# Patient Record
Sex: Male | Born: 1950
Health system: Southern US, Community
[De-identification: ages and names within clinical notes are randomized; demographics above are authoritative.]

## PROBLEM LIST (undated history)

## (undated) DIAGNOSIS — I10 Essential (primary) hypertension: Secondary | ICD-10-CM

## (undated) DIAGNOSIS — J189 Pneumonia, unspecified organism: Secondary | ICD-10-CM

## (undated) DIAGNOSIS — K219 Gastro-esophageal reflux disease without esophagitis: Secondary | ICD-10-CM

## (undated) DIAGNOSIS — Z87442 Personal history of urinary calculi: Secondary | ICD-10-CM

## (undated) DIAGNOSIS — E7800 Pure hypercholesterolemia, unspecified: Secondary | ICD-10-CM

## (undated) DIAGNOSIS — E785 Hyperlipidemia, unspecified: Secondary | ICD-10-CM

## (undated) DIAGNOSIS — J449 Chronic obstructive pulmonary disease, unspecified: Secondary | ICD-10-CM

## (undated) DIAGNOSIS — M1990 Unspecified osteoarthritis, unspecified site: Secondary | ICD-10-CM

## (undated) DIAGNOSIS — R7303 Prediabetes: Secondary | ICD-10-CM

## (undated) DIAGNOSIS — T7840XA Allergy, unspecified, initial encounter: Secondary | ICD-10-CM

## (undated) DIAGNOSIS — C801 Malignant (primary) neoplasm, unspecified: Secondary | ICD-10-CM

## (undated) DIAGNOSIS — N189 Chronic kidney disease, unspecified: Secondary | ICD-10-CM

## (undated) DIAGNOSIS — T8859XA Other complications of anesthesia, initial encounter: Secondary | ICD-10-CM

## (undated) HISTORY — DX: Allergy, unspecified, initial encounter: T78.40XA

## (undated) HISTORY — DX: Essential (primary) hypertension: I10

## (undated) HISTORY — DX: Chronic obstructive pulmonary disease, unspecified: J44.9

## (undated) HISTORY — DX: Gastro-esophageal reflux disease without esophagitis: K21.9

## (undated) HISTORY — DX: Hyperlipidemia, unspecified: E78.5

## (undated) HISTORY — DX: Malignant (primary) neoplasm, unspecified: C80.1

## (undated) HISTORY — DX: Pure hypercholesterolemia, unspecified: E78.00

## (undated) HISTORY — PX: NASAL SINUS SURGERY: SHX719

## (undated) HISTORY — PX: HERNIA REPAIR: SHX51

---

## 2006-11-04 ENCOUNTER — Emergency Department: Payer: Self-pay | Admitting: Emergency Medicine

## 2010-05-17 ENCOUNTER — Ambulatory Visit: Payer: Self-pay | Admitting: Otolaryngology

## 2014-01-17 ENCOUNTER — Ambulatory Visit (INDEPENDENT_AMBULATORY_CARE_PROVIDER_SITE_OTHER): Payer: BC Managed Care – PPO

## 2014-01-17 ENCOUNTER — Ambulatory Visit (INDEPENDENT_AMBULATORY_CARE_PROVIDER_SITE_OTHER): Payer: BC Managed Care – PPO | Admitting: Podiatry

## 2014-01-17 ENCOUNTER — Encounter: Payer: Self-pay | Admitting: Podiatry

## 2014-01-17 VITALS — BP 123/81 | HR 67 | Resp 16 | Ht 67.0 in | Wt 200.0 lb

## 2014-01-17 DIAGNOSIS — M722 Plantar fascial fibromatosis: Secondary | ICD-10-CM

## 2014-01-17 MED ORDER — TRIAMCINOLONE ACETONIDE 10 MG/ML IJ SUSP
10.0000 mg | Freq: Once | INTRAMUSCULAR | Status: AC
Start: 2014-01-17 — End: 2014-01-17
  Administered 2014-01-17: 10 mg

## 2014-01-17 NOTE — Progress Notes (Signed)
   Subjective:    Patient ID: Steve Gilbert, male    DOB: 15-Feb-1951, 63 y.o.   MRN: [id number]  HPI Comments: Been having left heel pain , started the first week of June. Using over the counter inserts. Had orthotics years ago.   Foot Pain      Review of Systems  HENT:       Sinus problems   Gastrointestinal: Positive for diarrhea.  Hematological: Bruises/bleeds easily.  All other systems reviewed and are negative.      Objective:   Physical Exam        Assessment & Plan:

## 2014-01-17 NOTE — Patient Instructions (Signed)
Plantar Fasciitis (Heel Spur Syndrome) with Rehab The plantar fascia is a fibrous, ligament-like, soft-tissue structure that spans the bottom of the foot. Plantar fasciitis is a condition that causes pain in the foot due to inflammation of the tissue. SYMPTOMS   Pain and tenderness on the underneath side of the foot.  Pain that worsens with standing or walking. CAUSES  Plantar fasciitis is caused by irritation and injury to the plantar fascia on the underneath side of the foot. Common mechanisms of injury include:  Direct trauma to bottom of the foot.  Damage to a small nerve that runs under the foot where the main fascia attaches to the heel bone.  Stress placed on the plantar fascia due to bone spurs. RISK INCREASES WITH:   Activities that place stress on the plantar fascia (running, jumping, pivoting, or cutting).  Poor strength and flexibility.  Improperly fitted shoes.  Tight calf muscles.  Flat feet.  Failure to warm-up properly before activity.  Obesity. PREVENTION  Warm up and stretch properly before activity.  Allow for adequate recovery between workouts.  Maintain physical fitness:  Strength, flexibility, and endurance.  Cardiovascular fitness.  Maintain a health body weight.  Avoid stress on the plantar fascia.  Wear properly fitted shoes, including arch supports for individuals who have flat feet. PROGNOSIS  If treated properly, then the symptoms of plantar fasciitis usually resolve without surgery. However, occasionally surgery is necessary. RELATED COMPLICATIONS   Recurrent symptoms that may result in a chronic condition.  Problems of the lower back that are caused by compensating for the injury, such as limping.  Pain or weakness of the foot during push-off following surgery.  Chronic inflammation, scarring, and partial or complete fascia tear, occurring more often from repeated injections. TREATMENT  Treatment initially involves the use of  ice and medication to help reduce pain and inflammation. The use of strengthening and stretching exercises may help reduce pain with activity, especially stretches of the Achilles tendon. These exercises may be performed at home or with a therapist. Your caregiver may recommend that you use heel cups of arch supports to help reduce stress on the plantar fascia. Occasionally, corticosteroid injections are given to reduce inflammation. If symptoms persist for greater than 6 months despite non-surgical (conservative), then surgery may be recommended.  MEDICATION   If pain medication is necessary, then nonsteroidal anti-inflammatory medications, such as aspirin and ibuprofen, or other minor pain relievers, such as acetaminophen, are often recommended.  Do not take pain medication within 7 days before surgery.  Prescription pain relievers may be given if deemed necessary by your caregiver. Use only as directed and only as much as you need.  Corticosteroid injections may be given by your caregiver. These injections should be reserved for the most serious cases, because they may only be given a certain number of times. HEAT AND COLD  Cold treatment (icing) relieves pain and reduces inflammation. Cold treatment should be applied for 10 to 15 minutes every 2 to 3 hours for inflammation and pain and immediately after any activity that aggravates your symptoms. Use ice packs or massage the area with a piece of ice (ice massage).  Heat treatment may be used prior to performing the stretching and strengthening activities prescribed by your caregiver, physical therapist, or athletic trainer. Use a heat pack or soak the injury in warm water. SEEK IMMEDIATE MEDICAL CARE IF:  Treatment seems to offer no benefit, or the condition worsens.  Any medications produce adverse side effects. EXERCISES RANGE   OF MOTION (ROM) AND STRETCHING EXERCISES - Plantar Fasciitis (Heel Spur Syndrome) These exercises may help you  when beginning to rehabilitate your injury. Your symptoms may resolve with or without further involvement from your physician, physical therapist or athletic trainer. While completing these exercises, remember:   Restoring tissue flexibility helps normal motion to return to the joints. This allows healthier, less painful movement and activity.  An effective stretch should be held for at least 30 seconds.  A stretch should never be painful. You should only feel a gentle lengthening or release in the stretched tissue. RANGE OF MOTION - Toe Extension, Flexion  Sit with your right / left leg crossed over your opposite knee.  Grasp your toes and gently pull them back toward the top of your foot. You should feel a stretch on the bottom of your toes and/or foot.  Hold this stretch for __________ seconds.  Now, gently pull your toes toward the bottom of your foot. You should feel a stretch on the top of your toes and or foot.  Hold this stretch for __________ seconds. Repeat __________ times. Complete this stretch __________ times per day.  RANGE OF MOTION - Ankle Dorsiflexion, Active Assisted  Remove shoes and sit on a chair that is preferably not on a carpeted surface.  Place right / left foot under knee. Extend your opposite leg for support.  Keeping your heel down, slide your right / left foot back toward the chair until you feel a stretch at your ankle or calf. If you do not feel a stretch, slide your bottom forward to the edge of the chair, while still keeping your heel down.  Hold this stretch for __________ seconds. Repeat __________ times. Complete this stretch __________ times per day.  STRETCH - Gastroc, Standing  Place hands on wall.  Extend right / left leg, keeping the front knee somewhat bent.  Slightly point your toes inward on your back foot.  Keeping your right / left heel on the floor and your knee straight, shift your weight toward the wall, not allowing your back to  arch.  You should feel a gentle stretch in the right / left calf. Hold this position for __________ seconds. Repeat __________ times. Complete this stretch __________ times per day. STRETCH - Soleus, Standing  Place hands on wall.  Extend right / left leg, keeping the other knee somewhat bent.  Slightly point your toes inward on your back foot.  Keep your right / left heel on the floor, bend your back knee, and slightly shift your weight over the back leg so that you feel a gentle stretch deep in your back calf.  Hold this position for __________ seconds. Repeat __________ times. Complete this stretch __________ times per day. STRETCH - Gastrocsoleus, Standing  Note: This exercise can place a lot of stress on your foot and ankle. Please complete this exercise only if specifically instructed by your caregiver.   Place the ball of your right / left foot on a step, keeping your other foot firmly on the same step.  Hold on to the wall or a rail for balance.  Slowly lift your other foot, allowing your body weight to press your heel down over the edge of the step.  You should feel a stretch in your right / left calf.  Hold this position for __________ seconds.  Repeat this exercise with a slight bend in your right / left knee. Repeat __________ times. Complete this stretch __________ times per day.    STRENGTHENING EXERCISES - Plantar Fasciitis (Heel Spur Syndrome)  These exercises may help you when beginning to rehabilitate your injury. They may resolve your symptoms with or without further involvement from your physician, physical therapist or athletic trainer. While completing these exercises, remember:   Muscles can gain both the endurance and the strength needed for everyday activities through controlled exercises.  Complete these exercises as instructed by your physician, physical therapist or athletic trainer. Progress the resistance and repetitions only as guided. STRENGTH -  Towel Curls  Sit in a chair positioned on a non-carpeted surface.  Place your foot on a towel, keeping your heel on the floor.  Pull the towel toward your heel by only curling your toes. Keep your heel on the floor.  If instructed by your physician, physical therapist or athletic trainer, add ____________________ at the end of the towel. Repeat __________ times. Complete this exercise __________ times per day. STRENGTH - Ankle Inversion  Secure one end of a rubber exercise band/tubing to a fixed object (table, pole). Loop the other end around your foot just before your toes.  Place your fists between your knees. This will focus your strengthening at your ankle.  Slowly, pull your big toe up and in, making sure the band/tubing is positioned to resist the entire motion.  Hold this position for __________ seconds.  Have your muscles resist the band/tubing as it slowly pulls your foot back to the starting position. Repeat __________ times. Complete this exercises __________ times per day.  Document Released: 07/04/2005 Document Revised: 09/26/2011 Document Reviewed: 10/16/2008 [employer] Patient Information 2015 [employer]. This information is not intended to replace advice given to you by your health care provider. Make sure you discuss any questions you have with your health care provider.  

## 2014-01-17 NOTE — Progress Notes (Signed)
Subjective:     Patient ID: Steve Gilbert, male   DOB: [birth year], 63 y.o.   MRN: [id number]  Foot Pain   patient states that I have a lot of pain in my left heel and it has been bothering me more for the last few weeks. I've had previous episodes with this and I have had orthotics but I'm not wearing [place] to have worn out  Review of Systems  All other systems reviewed and are negative.      Objective:   Physical Exam  Nursing note and vitals reviewed. Cardiovascular: Intact distal pulses.   Musculoskeletal: Normal range of motion.  Neurological: He is alert.  Skin: Skin is warm.   neurovascular status intact with muscle strength adequate and range of motion subtalar and midtarsal joint was found to be normal. Patient's neurovascular status is intact and I noted upon palpation plantar aspect of left heel is very tender when pressed making walking difficult    Assessment:     Acute plan her fasciitis plantar aspect left heel    Plan:     H&P and x-rays reviewed and injected the left plantar fashion 3 mg Kenalog 5 mg I can Marcaine mixture placed on diclofenac 75 mg twice a day dispensed fascially brace and reappoint in 3 weeks for also consideration of orthotics depending on response

## 2014-02-07 ENCOUNTER — Ambulatory Visit (INDEPENDENT_AMBULATORY_CARE_PROVIDER_SITE_OTHER): Payer: BC Managed Care – PPO | Admitting: Podiatry

## 2014-02-07 ENCOUNTER — Encounter: Payer: Self-pay | Admitting: Podiatry

## 2014-02-07 DIAGNOSIS — M722 Plantar fascial fibromatosis: Secondary | ICD-10-CM

## 2014-02-07 MED ORDER — TRIAMCINOLONE ACETONIDE 10 MG/ML IJ SUSP
10.0000 mg | Freq: Once | INTRAMUSCULAR | Status: AC
Start: 2014-02-07 — End: 2014-02-07
  Administered 2014-02-07: 10 mg

## 2014-02-08 NOTE — Progress Notes (Signed)
Subjective:     Patient ID: Steve Gilbert, male   DOB: March 08, 1951, 63 y.o.   MRN: [id number]  HPI patient continues to experience discomfort in the heel that is quite intense and admits that is worse in the morning and after periods of sitting.   Review of Systems     Objective:   Physical Exam Neurovascular status intact with pain in the plantar heel left that sore when pressed and making it difficult to walk comfortably. Also complains that he is starting to develop pain in other parts of his foot and lower leg    Assessment:     Acute plantar fasciitis    Plan:     Reviewed condition and at this time reinjected the plantar fascia 3 mg Kenalog 5 mg I can Marcaine mixture dispensed night splint with all instructions on usage and scanned for custom orthotic devices. Also placed on anti-inflammatory and instructed on reduced activity and stretching exercises

## 2014-02-11 NOTE — Progress Notes (Signed)
Patient returned night splint that he was given on 02/07/14. His wife already had one that he could use.

## 2014-02-18 ENCOUNTER — Encounter: Payer: Self-pay | Admitting: *Deleted

## 2014-02-18 NOTE — Progress Notes (Signed)
Sent pt post card letting him know orthotics are here.

## 2014-05-17 DIAGNOSIS — I10 Essential (primary) hypertension: Secondary | ICD-10-CM | POA: Insufficient documentation

## 2014-05-17 DIAGNOSIS — J449 Chronic obstructive pulmonary disease, unspecified: Secondary | ICD-10-CM | POA: Insufficient documentation

## 2014-05-17 DIAGNOSIS — K219 Gastro-esophageal reflux disease without esophagitis: Secondary | ICD-10-CM | POA: Insufficient documentation

## 2014-05-30 DIAGNOSIS — J300 Vasomotor rhinitis: Secondary | ICD-10-CM | POA: Insufficient documentation

## 2015-09-30 DIAGNOSIS — K5900 Constipation, unspecified: Secondary | ICD-10-CM | POA: Insufficient documentation

## 2017-07-31 DIAGNOSIS — I10 Essential (primary) hypertension: Secondary | ICD-10-CM | POA: Diagnosis not present

## 2017-07-31 DIAGNOSIS — Z0000 Encounter for general adult medical examination without abnormal findings: Secondary | ICD-10-CM | POA: Diagnosis not present

## 2017-07-31 DIAGNOSIS — E7800 Pure hypercholesterolemia, unspecified: Secondary | ICD-10-CM | POA: Diagnosis not present

## 2017-08-08 DIAGNOSIS — Z0000 Encounter for general adult medical examination without abnormal findings: Secondary | ICD-10-CM | POA: Insufficient documentation

## 2017-08-08 DIAGNOSIS — I10 Essential (primary) hypertension: Secondary | ICD-10-CM | POA: Diagnosis not present

## 2017-08-08 DIAGNOSIS — E7800 Pure hypercholesterolemia, unspecified: Secondary | ICD-10-CM | POA: Diagnosis not present

## 2017-08-08 DIAGNOSIS — J449 Chronic obstructive pulmonary disease, unspecified: Secondary | ICD-10-CM | POA: Diagnosis not present

## 2017-08-16 DIAGNOSIS — Z0000 Encounter for general adult medical examination without abnormal findings: Secondary | ICD-10-CM | POA: Diagnosis not present

## 2017-11-28 DIAGNOSIS — Z0000 Encounter for general adult medical examination without abnormal findings: Secondary | ICD-10-CM | POA: Diagnosis not present

## 2017-11-28 DIAGNOSIS — I10 Essential (primary) hypertension: Secondary | ICD-10-CM | POA: Diagnosis not present

## 2017-11-28 DIAGNOSIS — E7800 Pure hypercholesterolemia, unspecified: Secondary | ICD-10-CM | POA: Diagnosis not present

## 2017-12-06 DIAGNOSIS — J449 Chronic obstructive pulmonary disease, unspecified: Secondary | ICD-10-CM | POA: Diagnosis not present

## 2017-12-06 DIAGNOSIS — K219 Gastro-esophageal reflux disease without esophagitis: Secondary | ICD-10-CM | POA: Diagnosis not present

## 2017-12-06 DIAGNOSIS — R319 Hematuria, unspecified: Secondary | ICD-10-CM | POA: Diagnosis not present

## 2017-12-06 DIAGNOSIS — I10 Essential (primary) hypertension: Secondary | ICD-10-CM | POA: Diagnosis not present

## 2017-12-06 DIAGNOSIS — E7800 Pure hypercholesterolemia, unspecified: Secondary | ICD-10-CM | POA: Diagnosis not present

## 2017-12-14 DIAGNOSIS — R5383 Other fatigue: Secondary | ICD-10-CM | POA: Diagnosis not present

## 2017-12-14 DIAGNOSIS — R112 Nausea with vomiting, unspecified: Secondary | ICD-10-CM | POA: Diagnosis not present

## 2017-12-14 DIAGNOSIS — N390 Urinary tract infection, site not specified: Secondary | ICD-10-CM | POA: Diagnosis not present

## 2017-12-18 DIAGNOSIS — R7989 Other specified abnormal findings of blood chemistry: Secondary | ICD-10-CM | POA: Diagnosis not present

## 2018-01-12 ENCOUNTER — Ambulatory Visit (INDEPENDENT_AMBULATORY_CARE_PROVIDER_SITE_OTHER): Payer: PPO | Admitting: Urology

## 2018-01-12 ENCOUNTER — Encounter: Payer: Self-pay | Admitting: Urology

## 2018-01-12 VITALS — BP 132/87 | HR 91 | Resp 16 | Ht 67.0 in | Wt 205.4 lb

## 2018-01-12 DIAGNOSIS — R3129 Other microscopic hematuria: Secondary | ICD-10-CM

## 2018-01-12 DIAGNOSIS — N401 Enlarged prostate with lower urinary tract symptoms: Secondary | ICD-10-CM

## 2018-01-12 DIAGNOSIS — N138 Other obstructive and reflux uropathy: Secondary | ICD-10-CM

## 2018-01-12 DIAGNOSIS — N4889 Other specified disorders of penis: Secondary | ICD-10-CM

## 2018-01-12 DIAGNOSIS — Z87442 Personal history of urinary calculi: Secondary | ICD-10-CM | POA: Diagnosis not present

## 2018-01-12 DIAGNOSIS — R310 Gross hematuria: Secondary | ICD-10-CM | POA: Diagnosis not present

## 2018-01-12 DIAGNOSIS — N5203 Combined arterial insufficiency and corporo-venous occlusive erectile dysfunction: Secondary | ICD-10-CM | POA: Diagnosis not present

## 2018-01-12 DIAGNOSIS — R319 Hematuria, unspecified: Secondary | ICD-10-CM | POA: Diagnosis not present

## 2018-01-12 DIAGNOSIS — N402 Nodular prostate without lower urinary tract symptoms: Secondary | ICD-10-CM | POA: Diagnosis not present

## 2018-01-12 LAB — MICROSCOPIC EXAMINATION

## 2018-01-12 LAB — URINALYSIS, COMPLETE
BILIRUBIN UA: NEGATIVE
GLUCOSE, UA: NEGATIVE
KETONES UA: NEGATIVE
NITRITE UA: NEGATIVE
Protein, UA: NEGATIVE
Specific Gravity, UA: <1.005 — ABNORMAL LOW (ref 1.005–1.030)
Urobilinogen, Ur: 0.2 mg/dL (ref 0.2–1.0)
pH, UA: 6 (ref 5.0–7.5)

## 2018-01-12 MED ORDER — TAMSULOSIN HCL 0.4 MG PO CAPS: 0.4000 mg | ORAL_CAPSULE | Freq: Every day | ORAL | 0 refills | Status: DC

## 2018-01-12 NOTE — Progress Notes (Signed)
01/12/2018 12:09 PM   Steve Gilbert 1950-09-17 [id number]  Referring provider: Derinda Late, MD [phone] [facility] [place], [place] [address]  Chief Complaint  Patient presents with  . Hematuria    HPI: 67 yo male who presents today primarily for further evaluation of hematuria/hematospermia but also found to have multiple additional GU issues today.  He notes that approximately 1 month ago, he developed sharp penile pains radiating to the tip of his penis.  These occurred several times a day for several days in a row and then began to subside.  He had no associated urinary symptoms or bladder pain associated with this.  Shortly thereafter, he noticed bloodstained tinge in his underwear after intercourse the preceding day.  He believes that this may have come from his ejaculate fluid but may have come from his urine.  He was treated for presumed urinary tract infection on 12/06/2017 by his PCP for presumed urinary tract infection with a relatively benign appearing urinalysis.  He had 4-10 white blood cells per high-power field with no red blood cells.   Ultimately, the culture was negative.  He was treated with Bactrim but then developed nausea and vomiting and told to stop this medication.  He was referred to urology for further evaluation.  He does have baseline severe urinary symptoms.  He endorses a weak stream along with urinary hesitancy and postvoid dribbling.  He does feel like he is able to empty his bladder completely.  He has severe nocturia and gets up 5-8 times on average at nighttime which is long-standing.  He started saw palmetto several months ag and initially thought that this was not helping but when he ran out of his medication, his urinary symptoms worsened.  In terms of his nighttime urinary symptoms, he does not carry a diagnosis of obstructive sleep apnea.  He did have a sleep study back in the 90s and was told that  he does not have sleep apnea.  He has had nasal surgery in the past.  No lower extremity edema.  He does have mild to moderate ED with difficulty achieving an erection at times.  He is tried Viagra in the past and did not feel that there was much benefit to this medication.  He does history of kidney stones and has passed several kidney stone spontaneously.  He felt that this episode of penile pain was somewhat different than his previous kidney stone episodes.  He is never required surgical intervention for his stones.  He denies any flank pain.  Most recent PSA 1.4 07/2017.  No family history of prostate cancer.    He is a former smoker.  PMH: Past Medical History:  Diagnosis Date  . Acid reflux   . Allergy   . Cancer ([facility])   . COPD (chronic obstructive pulmonary disease) ([facility])   . High cholesterol   . Hypertension     Surgical History: Past Surgical History:  Procedure Laterality Date  . NASAL SINUS SURGERY      Home Medications:  Allergies as of 01/12/2018      Reactions   Contrast Media [iodinated Diagnostic Agents]       Medication List        Accurate as of 01/12/18 12:09 PM. Always use your most recent med list.          amlodipine-atorvastatin 10-10 MG tablet Commonly known as:  CADUET Take 1 tablet by mouth daily.  atorvastatin 20 MG tablet Commonly known as:  LIPITOR Take 20 mg by mouth daily.   BABY ASPIRIN PO Take by mouth.   DULERA 200-5 MCG/ACT Aero Generic drug:  mometasone-formoterol Inhale 2 puffs into the lungs 2 (two) times daily.   Fluticasone-Salmeterol 250-50 MCG/DOSE Aepb Commonly known as:  ADVAIR Inhale 1 puff into the lungs 2 (two) times daily.   losartan-hydrochlorothiazide 100-12.5 MG tablet Commonly known as:  HYZAAR Take 1 tablet by mouth daily.   MULTIVITAMIN & MINERAL PO Take by mouth.   pantoprazole 40 MG tablet Commonly known as:  PROTONIX Take 40 mg by mouth daily.   PATANASE NA Place into the nose.     rosuvastatin 5 MG tablet Commonly known as:  CRESTOR Take 5 mg by mouth daily.   saw palmetto 160 MG capsule Take 160 mg by mouth 2 (two) times daily.   tamsulosin 0.4 MG Caps capsule Commonly known as:  FLOMAX Take 1 capsule (0.4 mg total) by mouth daily.   tiotropium 18 MCG inhalation capsule Commonly known as:  SPIRIVA Place 18 mcg into inhaler and inhale daily.   TRIBENZOR 20-5-12.5 MG Tabs Generic drug:  Olmesartan-amLODIPine-HCTZ Take by mouth.   Vitamin D-3 5000 units Tabs Take by mouth.       Allergies:  Allergies  Allergen Reactions  . Contrast Media [Iodinated Diagnostic Agents]     Family History: Family History  Problem Relation Age of Onset  . Diabetes Mother   . Heart disease Father     Social History:  reports that he has quit smoking. He has never used smokeless tobacco. His alcohol and drug histories are not on file.  ROS: UROLOGY Frequent Urination?: Yes Hard to postpone urination?: Yes Burning/pain with urination?: No Get up at night to urinate?: Yes Leakage of urine?: Yes Urine stream starts and stops?: Yes Trouble starting stream?: Yes Do you have to strain to urinate?: Yes Blood in urine?: Yes Urinary tract infection?: No Sexually transmitted disease?: No Injury to kidneys or bladder?: No Painful intercourse?: No Weak stream?: Yes Erection problems?: Yes Penile pain?: Yes  Gastrointestinal Nausea?: Yes Vomiting?: Yes Indigestion/heartburn?: No Diarrhea?: No Constipation?: No  Constitutional Fever: No Night sweats?: No Weight loss?: No Fatigue?: No  Skin Skin rash/lesions?: No Itching?: No  Eyes Blurred vision?: No Double vision?: No  Ears/Nose/Throat Sore throat?: No Sinus problems?: Yes  Hematologic/Lymphatic Swollen glands?: No Easy bruising?: Yes  Cardiovascular Leg swelling?: No Chest pain?: No  Respiratory Cough?: No Shortness of breath?: No  Endocrine Excessive thirst?:  No  Musculoskeletal Back pain?: No Joint pain?: No  Neurological Headaches?: No Dizziness?: No  Psychologic Depression?: No Anxiety?: No  Physical Exam: BP 132/87   Pulse 91   Resp 16   Ht 5\' 7"  (1.702 m)   Wt 205 lb 6.4 oz (93.2 kg)   SpO2 96%   BMI 32.17 kg/m   Constitutional:  Alert and oriented, No acute distress. HEENT: [place] AT, moist mucus membranes.  Trachea midline, no masses. Cardiovascular: No clubbing, cyanosis, or edema. Respiratory: Normal respiratory effort, no increased work of breathing. GI: Abdomen is soft, nontender, nondistended, no abdominal masses GU: Circumcised phallus with orthotopic meatus.  No penile discharge.  No palpable penile lesions.  Bilateral descended testicles, no masses, nontender. Rectal: +external hemorrhoids.  Normal sphincter tone.  Small 30 cc prostate with 1 cm firm nodule at left apex.   Lymph: No cervical or inguinal lymphadenopathy. Skin: No rashes, bruises or suspicious lesions. Neurologic: Grossly intact, no  focal deficits, moving all 4 extremities. Psychiatric: Normal mood and affect.  Laboratory Data: Multiple urinalysis from 5/22 and 12/14/2017 personally reviewed today as well as urine culture data.  Creatinine 1.1 on 12/18/2017, previously 1.5 on 12/14/2017   Urinalysis Results for orders placed or performed in visit on 01/12/18  Microscopic Examination  Result Value Ref Range   WBC, UA 6-10 (A) 0 - 5 /hpf   RBC, UA 3-10 (A) 0 - 2 /hpf   Epithelial Cells (non renal) 0-10 0 - 10 /hpf   Mucus, UA Present (A) Not Estab.   Bacteria, UA Few (A) None seen/Few  Urinalysis, Complete  Result Value Ref Range   Specific Gravity, UA <1.005 (L) 1.005 - 1.030   pH, UA 6.0 5.0 - 7.5   Color, UA Yellow Yellow   Appearance Ur Clear Clear   Leukocytes, UA 1+ (A) Negative   Protein, UA Negative Negative/Trace   Glucose, UA Negative Negative   Ketones, UA Negative Negative   RBC, UA 2+ (A) Negative   Bilirubin, UA Negative  Negative   Urobilinogen, Ur 0.2 0.2 - 1.0 mg/dL   Nitrite, UA Negative Negative   Microscopic Examination See below:      Pertinent Imaging: n/a.  Assessment & Plan:    1. Microscopic hematuria Evidence of microscopic hematuria today which is concerning Plan for urine culture to rule out infection although not suspected based on urinalysis today We discussed the differential diagnosis for microscopic hematuria including nephrolithiasis, renal or upper tract tumors, bladder stones, UTIs, or bladder tumors as well as undetermined etiologies. Per AUA guidelines, I did recommend complete microscopic hematuria evaluation including CTU, possible urine cytology, and office cystoscopy. He is agreeable with this plan - Urinalysis, Complete - CT HEMATURIA WORKUP; Future - CULTURE, URINE COMPREHENSIVE  2. History of kidney stones Will assess with CT scan as above ? whether episode of penile pain possibly related to passing a small stone versus prostatitis  3. BPH with urinary obstruction Fairly significant obstructive urinary symptoms with nocturia We will go ahead and start the patient on Flomax and reassess urinary symptoms visit  Continue saw palmetto continue  4. Combined arterial insufficiency and corporo-venous occlusive erectile dysfunction Previously tried Viagra without response Will address further at follow-up visits  5. Prostate nodule Although his PSA is relatively low, nodule is highly suspicious for prostate cancer although differential diagnosis includes BPH nodule I strongly recommended prostate biopsy for further assessment We discussed prostate biopsy in detail including the procedure itself, the risks of blood in the urine, stool, and ejaculate, serious infection, and discomfort. He is willing to proceed with this as discussed.  6. Penile pain Improving Differential diagnosis includes prostatitis versus UTI versus stone episode   Return for cysto, CT scan,  prostate biopsy.  Hollice Espy, MD  [facility] [address], [place], [place] [address] [phone]

## 2018-01-15 LAB — CULTURE, URINE COMPREHENSIVE

## 2018-01-22 ENCOUNTER — Telehealth: Payer: Self-pay | Admitting: Urology

## 2018-01-22 NOTE — Telephone Encounter (Signed)
Patient has an order for a CT scan but it states that he is allergic to contrast. Does he need to be premedicated? Please advise   Thanks, Sharyn Lull

## 2018-01-23 ENCOUNTER — Other Ambulatory Visit: Payer: Self-pay

## 2018-01-23 MED ORDER — DIPHENHYDRAMINE HCL 50 MG PO TABS: 50.0000 mg | ORAL_TABLET | Freq: Once | ORAL | 0 refills | Status: DC

## 2018-01-23 MED ORDER — PREDNISONE 50 MG PO TABS: ORAL_TABLET | ORAL | 0 refills | Status: DC

## 2018-01-23 MED ORDER — RANITIDINE HCL 150 MG PO TABS: 150.0000 mg | ORAL_TABLET | Freq: Once | ORAL | 0 refills | Status: DC

## 2018-01-30 ENCOUNTER — Encounter (INDEPENDENT_AMBULATORY_CARE_PROVIDER_SITE_OTHER): Payer: Self-pay

## 2018-02-15 DIAGNOSIS — C675 Malignant neoplasm of bladder neck: Secondary | ICD-10-CM

## 2018-02-15 HISTORY — DX: Malignant neoplasm of bladder neck: C67.5

## 2018-03-09 ENCOUNTER — Ambulatory Visit
Admission: RE | Admit: 2018-03-09 | Discharge: 2018-03-09 | Disposition: A | Discharge status: Home / Self Care | Payer: PPO | Source: Ambulatory Visit | Attending: Urology | Admitting: Urology

## 2018-03-09 DIAGNOSIS — R3129 Other microscopic hematuria: Secondary | ICD-10-CM | POA: Diagnosis not present

## 2018-03-09 DIAGNOSIS — K4090 Unilateral inguinal hernia, without obstruction or gangrene, not specified as recurrent: Secondary | ICD-10-CM | POA: Insufficient documentation

## 2018-03-09 DIAGNOSIS — I700 Atherosclerosis of aorta: Secondary | ICD-10-CM | POA: Diagnosis not present

## 2018-03-09 LAB — POCT I-STAT CREATININE: CREATININE: 1.1 mg/dL (ref 0.61–1.24)

## 2018-03-09 MED ORDER — IOPAMIDOL (ISOVUE-300) INJECTION 61%
125.0000 mL | Freq: Once | INTRAVENOUS | Status: AC | PRN
  Administered 2018-03-09: 125 mL via INTRAVENOUS

## 2018-03-13 ENCOUNTER — Telehealth: Payer: Self-pay | Admitting: Radiology

## 2018-03-13 NOTE — Telephone Encounter (Signed)
Ok the app says he is scheduled for a BX, cysto and CT results. He is under the impression that is what he is having done? He was told to do the prep for the BX, does he not need to do this?  Sharyn Lull

## 2018-03-13 NOTE — Telephone Encounter (Signed)
I tried to get the patient to come in today but he was unable to come in. I did get him rescheduled for his BX and results. Please let me know if 03-29-18 is to long for him to wait and if it is where I can put him that will not overlap with another BX.  Thanks, Sharyn Lull

## 2018-03-13 NOTE — Telephone Encounter (Signed)
This patient needs to be rescheduled in clinic because you will be in surgery at his appointment time.  When can we get him on your schedule? Thanks.

## 2018-03-13 NOTE — Telephone Encounter (Signed)
This is not a biopsy.  This is to discuss CT scan results.  I would like to see him sooner rather than later, please put him on the wait list but keep this appointment unless something comes up sooner.  Hollice Espy, MD

## 2018-03-13 NOTE — Telephone Encounter (Signed)
Sorry, yes he has multiple issues.  He has multiple issues.    Steve Espy, MD

## 2018-03-15 ENCOUNTER — Other Ambulatory Visit: Payer: PPO | Admitting: Urology

## 2018-03-27 DIAGNOSIS — M7552 Bursitis of left shoulder: Secondary | ICD-10-CM | POA: Diagnosis not present

## 2018-03-29 ENCOUNTER — Encounter: Payer: Self-pay | Admitting: Urology

## 2018-03-29 ENCOUNTER — Ambulatory Visit (INDEPENDENT_AMBULATORY_CARE_PROVIDER_SITE_OTHER): Payer: PPO | Admitting: Urology

## 2018-03-29 VITALS — BP 143/90 | HR 91 | Ht 67.0 in | Wt 205.0 lb

## 2018-03-29 DIAGNOSIS — K4090 Unilateral inguinal hernia, without obstruction or gangrene, not specified as recurrent: Secondary | ICD-10-CM

## 2018-03-29 DIAGNOSIS — R3129 Other microscopic hematuria: Secondary | ICD-10-CM | POA: Diagnosis not present

## 2018-03-29 DIAGNOSIS — C679 Malignant neoplasm of bladder, unspecified: Secondary | ICD-10-CM | POA: Diagnosis not present

## 2018-03-29 DIAGNOSIS — R319 Hematuria, unspecified: Secondary | ICD-10-CM | POA: Diagnosis not present

## 2018-03-29 DIAGNOSIS — N1330 Unspecified hydronephrosis: Secondary | ICD-10-CM

## 2018-03-29 DIAGNOSIS — N4889 Other specified disorders of penis: Secondary | ICD-10-CM

## 2018-03-29 DIAGNOSIS — N402 Nodular prostate without lower urinary tract symptoms: Secondary | ICD-10-CM

## 2018-03-29 LAB — URINALYSIS, COMPLETE
BILIRUBIN UA: NEGATIVE
Glucose, UA: NEGATIVE
KETONES UA: NEGATIVE
NITRITE UA: NEGATIVE
Protein, UA: NEGATIVE
SPEC GRAV UA: 1.015 (ref 1.005–1.030)
UUROB: 0.2 mg/dL (ref 0.2–1.0)
pH, UA: 6 (ref 5.0–7.5)

## 2018-03-29 LAB — MICROSCOPIC EXAMINATION: Epithelial Cells (non renal): NONE SEEN /hpf (ref 0–10)

## 2018-03-29 MED ORDER — LEVOFLOXACIN 500 MG PO TABS
500.0000 mg | ORAL_TABLET | Freq: Once | ORAL | Status: AC
  Administered 2018-03-29: 500 mg via ORAL

## 2018-03-29 MED ORDER — GENTAMICIN SULFATE 40 MG/ML IJ SOLN
80.0000 mg | Freq: Once | INTRAMUSCULAR | Status: AC
  Administered 2018-03-29: 80 mg via INTRAMUSCULAR

## 2018-03-29 NOTE — Progress Notes (Signed)
   03/29/18  CC:  Chief Complaint  Patient presents with  . Cysto  . Prostate Biopsy    HPI: 67 year old male who initially presented with hematuria and hematospermia as well as penile pains who returns today with abnormal CT urogram for office cystoscopy and possible prostate biopsy.  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  There is also cortical scarring of the right kidney.  Incidental right inguinal fat-containing hernia appreciated.  He does have a firm nodule of the left apex and a small prostate.  PSA is quite low, 1.4 on 07/2017.  Blood pressure (!) 143/90, pulse 91, height 5\' 7"  (1.702 m), weight 205 lb (93 kg). NED. A&Ox3.   No respiratory distress   Abd soft, NT, ND Normal phallus with bilateral descended testicles  Cystoscopy Procedure Note  Patient identification was confirmed, informed consent was obtained, and patient was prepped using Betadine solution.  Lidocaine jelly was administered per urethral meatus.    Preoperative abx where received prior to procedure.     Pre-Procedure: - Inspection reveals a normal caliber ureteral meatus.  Procedure: The flexible cystoscope was introduced without difficulty - No urethral strictures/lesions are present. - Enlarged prostate trilobar coaptation, shagginess with necrosis and irregularity of the prostatic fossa is somewhat concerning for underlying lesion - Elevated bladder neck - Bilateral ureteral orifices identified -Right lateral wall bladder mucosa somewhat mounded up/erythematous and irregular in appearance without obvious papillary change.  The right UO is distorted and unable to be visualized clearly.  The left UO was normal. - No bladder stones - Moderate trabeculation  Retroflexion shows intravesical protrusion without a significant median  lobe   Post-Procedure: - Patient tolerated the procedure well  Assessment/ Plan:   1. Microscopic hematuria Grossly abnormal cystoscopy today both within the prostatic fossa as well as right trigone area concerning for underlying infiltrative process.  CT urogram also supports cystoscopic findings which are highly concerning.  I recommended further evaluation the operating room with cystoscopy, right retrograde pyelogram, possible right ureteroscopy with biopsies as needed in order to help elucidate the underlying issue.  We will also take some biopsies of his prostate transurethrally given its grossly abnormal appearance.  We discussed the risk and benefits today in detail including risk of bleeding, infection, damage surrounding structures.  Stent irritation was also discussed.  All questions were answered in detail.  - Urinalysis, Complete - levofloxacin (LEVAQUIN) tablet 500 mg - gentamicin (GARAMYCIN) injection 80 mg - CULTURE, URINE COMPREHENSIVE  2. Hydronephrosis of right kidney As above  3. Right inguinal hernia Incidental right inguinal hernia See #4 Nonurgent referral to general surgery for further surgical evaluation - Ambulatory referral to General Surgery  4. Penile pain Unclear if related to obstruction Patient does have a right inguinal hernia on the side as well which may be causing referred pain to the penis  5. Prostate nodule Abnormal rectal exam, however given abnormality of transurethral prostate and plans for biopsy of this in the operating room, will defer transrectal biopsy today as previously scheduled PSA is reassuring   Hollice Espy, MD

## 2018-03-29 NOTE — H&P (View-Only) (Signed)
   03/29/18  CC:  Chief Complaint  Patient presents with  . Cysto  . Prostate Biopsy    HPI: 67 year old male who initially presented with hematuria and hematospermia as well as penile pains who returns today with abnormal CT urogram for office cystoscopy and possible prostate biopsy.  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  There is also cortical scarring of the right kidney.  Incidental right inguinal fat-containing hernia appreciated.  He does have a firm nodule of the left apex and a small prostate.  PSA is quite low, 1.4 on 07/2017.  Blood pressure (!) 143/90, pulse 91, height 5\' 7"  (1.702 m), weight 205 lb (93 kg). NED. A&Ox3.   No respiratory distress   Abd soft, NT, ND Normal phallus with bilateral descended testicles  Cystoscopy Procedure Note  Patient identification was confirmed, informed consent was obtained, and patient was prepped using Betadine solution.  Lidocaine jelly was administered per urethral meatus.    Preoperative abx where received prior to procedure.     Pre-Procedure: - Inspection reveals a normal caliber ureteral meatus.  Procedure: The flexible cystoscope was introduced without difficulty - No urethral strictures/lesions are present. - Enlarged prostate trilobar coaptation, shagginess with necrosis and irregularity of the prostatic fossa is somewhat concerning for underlying lesion - Elevated bladder neck - Bilateral ureteral orifices identified -Right lateral wall bladder mucosa somewhat mounded up/erythematous and irregular in appearance without obvious papillary change.  The right UO is distorted and unable to be visualized clearly.  The left UO was normal. - No bladder stones - Moderate trabeculation  Retroflexion shows intravesical protrusion without a significant median  lobe   Post-Procedure: - Patient tolerated the procedure well  Assessment/ Plan:   1. Microscopic hematuria Grossly abnormal cystoscopy today both within the prostatic fossa as well as right trigone area concerning for underlying infiltrative process.  CT urogram also supports cystoscopic findings which are highly concerning.  I recommended further evaluation the operating room with cystoscopy, right retrograde pyelogram, possible right ureteroscopy with biopsies as needed in order to help elucidate the underlying issue.  We will also take some biopsies of his prostate transurethrally given its grossly abnormal appearance.  We discussed the risk and benefits today in detail including risk of bleeding, infection, damage surrounding structures.  Stent irritation was also discussed.  All questions were answered in detail.  - Urinalysis, Complete - levofloxacin (LEVAQUIN) tablet 500 mg - gentamicin (GARAMYCIN) injection 80 mg - CULTURE, URINE COMPREHENSIVE  2. Hydronephrosis of right kidney As above  3. Right inguinal hernia Incidental right inguinal hernia See #4 Nonurgent referral to general surgery for further surgical evaluation - Ambulatory referral to General Surgery  4. Penile pain Unclear if related to obstruction Patient does have a right inguinal hernia on the side as well which may be causing referred pain to the penis  5. Prostate nodule Abnormal rectal exam, however given abnormality of transurethral prostate and plans for biopsy of this in the operating room, will defer transrectal biopsy today as previously scheduled PSA is reassuring   Hollice Espy, MD

## 2018-03-30 ENCOUNTER — Encounter
Admission: RE | Admit: 2018-03-30 | Discharge: 2018-03-30 | Disposition: A | Discharge status: Home / Self Care | Payer: PPO | Source: Ambulatory Visit | Attending: Urology | Admitting: Urology

## 2018-03-30 ENCOUNTER — Other Ambulatory Visit: Payer: Self-pay

## 2018-03-30 ENCOUNTER — Other Ambulatory Visit: Payer: Self-pay | Admitting: Radiology

## 2018-03-30 ENCOUNTER — Telehealth: Payer: Self-pay | Admitting: Urology

## 2018-03-30 DIAGNOSIS — I10 Essential (primary) hypertension: Secondary | ICD-10-CM | POA: Diagnosis not present

## 2018-03-30 DIAGNOSIS — N138 Other obstructive and reflux uropathy: Secondary | ICD-10-CM

## 2018-03-30 DIAGNOSIS — N1330 Unspecified hydronephrosis: Secondary | ICD-10-CM

## 2018-03-30 DIAGNOSIS — Z01818 Encounter for other preprocedural examination: Secondary | ICD-10-CM | POA: Insufficient documentation

## 2018-03-30 DIAGNOSIS — J449 Chronic obstructive pulmonary disease, unspecified: Secondary | ICD-10-CM | POA: Insufficient documentation

## 2018-03-30 DIAGNOSIS — N401 Enlarged prostate with lower urinary tract symptoms: Principal | ICD-10-CM

## 2018-03-30 DIAGNOSIS — N402 Nodular prostate without lower urinary tract symptoms: Secondary | ICD-10-CM

## 2018-03-30 DIAGNOSIS — N329 Bladder disorder, unspecified: Secondary | ICD-10-CM

## 2018-03-30 HISTORY — DX: Pneumonia, unspecified organism: J18.9

## 2018-03-30 LAB — CBC
HEMATOCRIT: 42.6 % (ref 40.0–52.0)
HEMOGLOBIN: 14.8 g/dL (ref 13.0–18.0)
MCH: 31.1 pg (ref 26.0–34.0)
MCHC: 34.9 g/dL (ref 32.0–36.0)
MCV: 89.3 fL (ref 80.0–100.0)
Platelets: 233 10*3/uL (ref 150–440)
RBC: 4.77 MIL/uL (ref 4.40–5.90)
RDW: 13.8 % (ref 11.5–14.5)
WBC: 7.3 10*3/uL (ref 3.8–10.6)

## 2018-03-30 LAB — BASIC METABOLIC PANEL
Anion gap: 5 (ref 5–15)
BUN: 20 mg/dL (ref 8–23)
CHLORIDE: 104 mmol/L (ref 98–111)
CO2: 30 mmol/L (ref 22–32)
CREATININE: 1.12 mg/dL (ref 0.61–1.24)
Calcium: 9 mg/dL (ref 8.9–10.3)
GFR calc non Af Amer: >60 mL/min (ref >60)
Glucose, Bld: 81 mg/dL (ref 70–99)
Potassium: 3.5 mmol/L (ref 3.5–5.1)
Sodium: 139 mmol/L (ref 135–145)

## 2018-03-30 NOTE — Telephone Encounter (Signed)
Insurance Authorization for Outpatient Surgery CPT [id number] CPT 04136 CPT [id number] CPT [phone] Diag: N13.30  = right hydronephrosis Diag. D41.4 = bladder lesion Diag: N40.2 = prostate lesion  DOS 04/04/2018 Dr. Hollice Espy  Per [place] website, CPT code search, prior authorization is NOT required.  L[place] 03/30/2018

## 2018-03-30 NOTE — Patient Instructions (Addendum)
  Your procedure is scheduled on: Wednesday April 04, 2018 Report to Same Day Surgery 2nd floor medical mall ([facility] [facility]-take elevator on left to 2nd floor.  Check in with surgery information desk.) To find out your arrival time please call [phone] between 1PM - 3PM on Tuesday April 03, 2018  Remember: Instructions that are not followed completely may result in serious medical risk, up to and including death, or upon the discretion of your surgeon and anesthesiologist your surgery may need to be rescheduled.    _x___ 1. Do not eat food (including mints, candies, chewing gum) after midnight the night before your procedure. You may drink clear liquids up to 2 hours before you are scheduled to arrive at the hospital for your procedure.  Do not drink clear liquids within 2 hours of your scheduled arrival to the hospital.  Clear liquids include  --Water or Apple juice without pulp  --Clear carbohydrate beverage such as Gatorade  --Black Coffee or Clear Tea (No milk, no creamers, do not add anything to the coffee or tea)    __x__ 2. No Alcohol for 24 hours before or after surgery.   __x__ 3. No Smoking or e-cigarettes for 24 prior to surgery.  Do not use any chewable tobacco products for at least 6 hour prior to surgery   __x__ 4. Notify your doctor if there is any change in your medical condition (cold, fever, infections).   __x__ 5. On the morning of surgery brush your teeth with toothpaste and water.  You may rinse your mouth with mouth wash if you wish.  Do not swallow any toothpaste or mouthwash.  Please read over the following fact sheets that you were given:   [facility] Preparing for Surgery and or MRSA Information    __x__ Use CHG Soap or sage wipes as directed on instruction sheet    Do not wear jewelry, or use any lotions, powders, deodorant, or perfumes.   Do not shave below the face/neck 48 hours prior to surgery.   Do not bring valuables to the  hospital.    [facility] is not responsible for any belongings or valuables.               Contacts, dentures or bridgework may not be worn into surgery.  Leave your suitcase in the car. After surgery it may be brought to your room.  For patients admitted to the hospital, discharge time is determined by your treatment team.  For patients discharged on the day of surgery, you will NOT be permitted to drive yourself home.   _x___ Take anti-hypertensive listed below, cardiac, seizure, asthma, anti-reflux and psychiatric medicines. These include:  1. Amlodipine/Norvasc  2. Atorvastatin/Lipitor  Do NOT take your Losartan or Hydrochlorothiazide on the day of surgery.   _x___ Use inhalers on the day of surgery and bring to hospital day of surgery  _x___ Follow recommendations from Cardiologist, Pulmonologist or PCP regarding stopping Aspirin, Coumadin, Plavix ,Eliquis, Effient, or Pradaxa, and Pletal.  _x___ Stop Anti-inflammatories such as Advil, Aleve, Ibuprofen, Motrin, Naproxen, Naprosyn, Goodies powders or aspirin products. OK to take Tylenol and Celebrex.   _x___ NOW: Stop supplements until after surgery.  But may continue Vitamin D, Vitamin B, and multivitamin.

## 2018-04-02 LAB — CULTURE, URINE COMPREHENSIVE

## 2018-04-03 ENCOUNTER — Ambulatory Visit (INDEPENDENT_AMBULATORY_CARE_PROVIDER_SITE_OTHER): Payer: PPO | Admitting: Surgery

## 2018-04-03 ENCOUNTER — Encounter: Payer: Self-pay | Admitting: Surgery

## 2018-04-03 DIAGNOSIS — E785 Hyperlipidemia, unspecified: Secondary | ICD-10-CM

## 2018-04-03 DIAGNOSIS — K4090 Unilateral inguinal hernia, without obstruction or gangrene, not specified as recurrent: Secondary | ICD-10-CM | POA: Insufficient documentation

## 2018-04-03 HISTORY — DX: Hyperlipidemia, unspecified: E78.5

## 2018-04-03 MED ORDER — CEFAZOLIN SODIUM-DEXTROSE 2-4 GM/100ML-% IV SOLN
2.0000 g | INTRAVENOUS | Status: AC
  Administered 2018-04-04: 2 g via INTRAVENOUS

## 2018-04-03 NOTE — Progress Notes (Signed)
04/03/2018  Reason for Visit:  Right inguinal hernia  Referring Provider:  Hollice Espy, MD  History of Present Illness: Steve Gilbert is a 67 y.o. male presenting for evaluation of a right inguinal hernia.  The patient is currently being evaluated by Dr. Erlene Quan for issues with UTIs and abnormal findings on intraoffice cystoscopy.  He is actually scheduled to undergo cystoscopy under anesthesia with stent placement and biopsies tomorrow with her.  During the work-up for this, the patient had a CT scan on/23/19.  During the CT scan it was found the patient had a right-sided inguinal hernia containing fat only.  Due to this he was referred to surgery for evaluation.  The patient reports that he had not known about this hernia until he was told about it on the CT scan.  He denies having any right groin pain or discomfort and never felt any popping sensation and does not recall any scenarios like that.  He does because there is when he is down heavy lifting or exertion but not associated with pain or discomfort afterwards.  He has not felt any bulging either.  Has any constipation, diarrhea, or obstructive symptoms.  Past Medical History: Past Medical History:  Diagnosis Date  . Acid reflux   . Allergy   . Cancer ([place])   . COPD (chronic obstructive pulmonary disease) ([place])   . High cholesterol   . Hyperlipidemia 04/03/2018  . Hypertension   . Pneumonia      Past Surgical History: Past Surgical History:  Procedure Laterality Date  . NASAL SINUS SURGERY      Home Medications: Prior to Admission medications   Medication Sig Start Date End Date Taking? Authorizing Provider  amLODipine (NORVASC) 10 MG tablet Take 10 mg by mouth daily.   Yes [provider]  atorvastatin (LIPITOR) 20 MG tablet Take 20 mg by mouth daily.   Yes [provider]  hydrochlorothiazide (HYDRODIURIL) 12.5 MG tablet TAKE 1 TABLET PO QD 03/21/18  Yes [provider]  losartan (COZAAR) 100  MG tablet TK 1 T PO QD 03/21/18  Yes [provider]  meloxicam (MOBIC) 15 MG tablet Take 1 tablet by mouth 1 day or 1 dose. 03/28/18  Yes [provider]  pantoprazole (PROTONIX) 40 MG tablet Take 40 mg by mouth daily.   Yes [provider]  tamsulosin (FLOMAX) 0.4 MG CAPS capsule Take 1 capsule (0.4 mg total) by mouth daily. 01/12/18  Yes Hollice Espy, MD  tiotropium (SPIRIVA) 18 MCG inhalation capsule Place 18 mcg into inhaler and inhale as needed.    Yes [provider]  diphenhydrAMINE (BENADRYL) 50 MG tablet Take 1 tablet (50 mg total) by mouth once for 1 dose. 1 tablet 1 hour prior to CTscan 01/23/18 01/23/18  Stoioff, Ronda Fairly, MD  ranitidine (ZANTAC) 150 MG tablet Take 1 tablet (150 mg total) by mouth once for 1 dose. Take 1 tablet 1 hour prior to CTscan 01/23/18 01/23/18  Stoioff, Ronda Fairly, MD    Allergies: Allergies  Allergen Reactions  . Contrast Media [Iodinated Diagnostic Agents]     Social History:  reports that he quit smoking about 10 years ago. His smoking use included cigarettes. He has a 45.00 pack-year smoking history. He has quit using smokeless tobacco. He reports that he drinks about 7.0 standard drinks of alcohol per week. He reports that he has current or past drug history.   Family History: Family History  Problem Relation Age of Onset  . Diabetes  Mother   . Heart disease Father   . Diabetes Brother   . Heart attack Brother   . Heart disease Brother     Review of Systems: Review of Systems  Constitutional: Negative for chills and fever.  HENT: Negative for hearing loss.   Eyes: Negative for blurred vision.  Respiratory: Negative for shortness of breath.   Cardiovascular: Negative for chest pain.  Gastrointestinal: Negative for abdominal pain, nausea and vomiting.  Genitourinary: Positive for dysuria.  Musculoskeletal: Negative for myalgias.  Skin: Negative for rash.  Neurological: Negative for dizziness.   Psychiatric/Behavioral: Negative for depression.    Physical Exam BP (!) 142/88   Pulse 98   Temp 98.2 F (36.8 C) (Skin)   Wt 212 lb (96.2 kg)   SpO2 95% Comment: room air  BMI 33.20 kg/m  CONSTITUTIONAL: No acute distress HEENT:  Normocephalic, atraumatic, extraocular motion intact. NECK: Trachea is midline, and there is no jugular venous distension.  RESPIRATORY:  Lungs are clear, and breath sounds are equal bilaterally. Normal respiratory effort without pathologic use of accessory muscles. CARDIOVASCULAR: Heart is regular without murmurs, gallops, or rubs. GI: The abdomen is soft, obese, nondistended, nontender to palpation.  The patient has a reducible small sized right inguinal hernia with no discomfort or tenderness on palpation or manipulation. MUSCULOSKELETAL:  Normal muscle strength and tone in all four extremities.  No peripheral edema or cyanosis. SKIN: Skin turgor is normal. There are no pathologic skin lesions.  NEUROLOGIC:  Motor and sensation is grossly normal.  Cranial nerves are grossly intact. PSYCH:  Alert and oriented to person, place and time. Affect is normal.  Laboratory Analysis: Recent lab work is overall unremarkable with a normal BMP and a normal CBC.  Imaging: CT scan from 03/09/2018 IMPRESSION: 1. Findings are consistent with chronic low-grade obstruction at the right ureterovesical junction. No clear benign etiology is demonstrated, and although this could be due to a benign stricture, cystoscopy is recommended to exclude bladder cancer and prostate cancer as the etiology. 2. Cortical scarring in the right kidney with possible single calculus in the wall of a calyx in the mid kidney. No other urinary tract calculi. 3. No evidence of metastatic disease. 4.  Aortic Atherosclerosis (ICD10-I70.0). 5. Right inguinal hernia containing fat.   Assessment and Plan: This is a 67 y.o. male with an incidental finding of a right inguinal hernia on CT scan.  I  have independently reviewed the patient's imaging study and reviewed the patient's laboratory studies.  Overall the patient does have a right inguinal hernia although it only contains fat he has been asymptomatic.  His laboratory work-up is unremarkable.  Discussed with the patient that given that he has no symptoms and this was an incidental finding on CT scan, there is really no urgency to proceeding with a hernia repair.  At this point I would rather he be worked up by Dr. Erlene Quan and complete that work-up before any surgery is offered.  The patient is asymptomatic and currently he does not seem to be interested in any hernia repair unless it were necessary.  I did discuss with the patient the different kinds of hernias including reducible which is his, incarcerated, and strangulated and the different symptoms that he could expect or should look out for including discomfort burning sensation or pain at the right groin, obstructive symptoms, and particularly sudden onset of exquisite amount of pain which could like strangulation.  In that case, he is advised to come to the hospital  immediately for further evaluation.  This point otherwise there is no acute surgical needs.  If he does start developing any symptoms he is welcome to call us to talk more about the possibility of surgery.  Can follow-up with [name] on an as-needed basis.  Face-to-face time spent with the patient and care providers was 60 minutes, with more than 50% of the time spent counseling, educating, and coordinating care of the patient.     Melvyn Neth, [facility]

## 2018-04-03 NOTE — Patient Instructions (Signed)
Inguinal Hernia, Adult An inguinal hernia is when fat or the intestines push through the area where the leg meets the lower belly (groin) and make a rounded lump (bulge). This condition happens over time. There are three types of inguinal hernias. These types include:  Hernias that can be pushed back into the belly (are reducible).  Hernias that cannot be pushed back into the belly (are incarcerated).  Hernias that cannot be pushed back into the belly and lose their blood supply (get strangulated). This type needs emergency surgery.  Follow these instructions at home: Lifestyle  Drink enough fluid to keep your urine (pee) clear or pale yellow.  Eat plenty of fruits, vegetables, and whole grains. These have a lot of fiber. Talk with your doctor if you have questions.  Avoid lifting heavy objects.  Avoid standing for long periods of time.  Do not use tobacco products. These include cigarettes, chewing tobacco, or e-cigarettes. If you need help quitting, ask your doctor.  Try to stay at a healthy weight. General instructions  Do not try to force the hernia back in.  Watch your hernia for any changes in color or size. Let your doctor know if there are any changes.  Take over-the-counter and prescription medicines only as told by your doctor.  Keep all follow-up visits as told by your doctor. This is important. Contact a doctor if:  You have a fever.  You have new symptoms.  Your symptoms get worse. Get help right away if:  The area where the legs meets the lower belly has: ? Pain that gets worse suddenly. ? A bulge that gets bigger suddenly and does not go down. ? A bulge that turns red or purple. ? A bulge that is painful to the touch.  You are a man and your scrotum: ? Suddenly feels painful. ? Suddenly changes in size.  You feel sick to your stomach (nauseous) and this feeling does not go away.  You throw up (vomit) and this keeps happening.  You feel your heart  beating a lot more quickly than normal.  You cannot poop (have a bowel movement) or pass gas. This information is not intended to replace advice given to you by your health care provider. Make sure you discuss any questions you have with your health care provider. Document Released: 08/04/2006 Document Revised: 12/10/2015 Document Reviewed: 05/14/2014 Elsevier Interactive Patient Education  2018 [employer]  

## 2018-04-04 ENCOUNTER — Other Ambulatory Visit: Payer: Self-pay

## 2018-04-04 ENCOUNTER — Ambulatory Visit: Payer: PPO | Admitting: Anesthesiology

## 2018-04-04 ENCOUNTER — Encounter: Payer: Self-pay | Admitting: *Deleted

## 2018-04-04 ENCOUNTER — Other Ambulatory Visit: Payer: Self-pay | Admitting: Urology

## 2018-04-04 ENCOUNTER — Observation Stay
Admission: RE | Admit: 2018-04-04 | Discharge: 2018-04-05 | Disposition: A | Discharge status: Home / Self Care | Payer: PPO | Source: Ambulatory Visit | Attending: Urology | Admitting: Urology

## 2018-04-04 ENCOUNTER — Encounter
Admission: RE | Disposition: A | Discharge status: Home / Self Care | Payer: Self-pay | Source: Ambulatory Visit | Attending: Urology

## 2018-04-04 DIAGNOSIS — N329 Bladder disorder, unspecified: Secondary | ICD-10-CM | POA: Diagnosis not present

## 2018-04-04 DIAGNOSIS — K5900 Constipation, unspecified: Secondary | ICD-10-CM | POA: Diagnosis not present

## 2018-04-04 DIAGNOSIS — Z79899 Other long term (current) drug therapy: Secondary | ICD-10-CM | POA: Insufficient documentation

## 2018-04-04 DIAGNOSIS — I10 Essential (primary) hypertension: Secondary | ICD-10-CM | POA: Insufficient documentation

## 2018-04-04 DIAGNOSIS — Z87891 Personal history of nicotine dependence: Secondary | ICD-10-CM | POA: Insufficient documentation

## 2018-04-04 DIAGNOSIS — C670 Malignant neoplasm of trigone of bladder: Secondary | ICD-10-CM | POA: Diagnosis not present

## 2018-04-04 DIAGNOSIS — N4289 Other specified disorders of prostate: Secondary | ICD-10-CM | POA: Diagnosis not present

## 2018-04-04 DIAGNOSIS — K4090 Unilateral inguinal hernia, without obstruction or gangrene, not specified as recurrent: Secondary | ICD-10-CM | POA: Diagnosis not present

## 2018-04-04 DIAGNOSIS — N4889 Other specified disorders of penis: Secondary | ICD-10-CM | POA: Insufficient documentation

## 2018-04-04 DIAGNOSIS — D4121 Neoplasm of uncertain behavior of right ureter: Secondary | ICD-10-CM | POA: Diagnosis not present

## 2018-04-04 DIAGNOSIS — C675 Malignant neoplasm of bladder neck: Principal | ICD-10-CM | POA: Insufficient documentation

## 2018-04-04 DIAGNOSIS — J449 Chronic obstructive pulmonary disease, unspecified: Secondary | ICD-10-CM | POA: Diagnosis not present

## 2018-04-04 DIAGNOSIS — E785 Hyperlipidemia, unspecified: Secondary | ICD-10-CM | POA: Insufficient documentation

## 2018-04-04 DIAGNOSIS — N138 Other obstructive and reflux uropathy: Secondary | ICD-10-CM

## 2018-04-04 DIAGNOSIS — K219 Gastro-esophageal reflux disease without esophagitis: Secondary | ICD-10-CM | POA: Diagnosis not present

## 2018-04-04 DIAGNOSIS — N401 Enlarged prostate with lower urinary tract symptoms: Secondary | ICD-10-CM

## 2018-04-04 DIAGNOSIS — R3129 Other microscopic hematuria: Secondary | ICD-10-CM | POA: Insufficient documentation

## 2018-04-04 DIAGNOSIS — D494 Neoplasm of unspecified behavior of bladder: Secondary | ICD-10-CM | POA: Diagnosis not present

## 2018-04-04 DIAGNOSIS — N402 Nodular prostate without lower urinary tract symptoms: Secondary | ICD-10-CM

## 2018-04-04 DIAGNOSIS — N1330 Unspecified hydronephrosis: Secondary | ICD-10-CM | POA: Diagnosis not present

## 2018-04-04 DIAGNOSIS — R361 Hematospermia: Secondary | ICD-10-CM | POA: Insufficient documentation

## 2018-04-04 DIAGNOSIS — C679 Malignant neoplasm of bladder, unspecified: Secondary | ICD-10-CM | POA: Diagnosis present

## 2018-04-04 HISTORY — PX: URETERAL BIOPSY: SHX6688

## 2018-04-04 HISTORY — PX: CYSTOSCOPY WITH URETEROSCOPY AND STENT PLACEMENT: SHX6377

## 2018-04-04 HISTORY — PX: CYSTOSCOPY WITH BIOPSY: SHX5122

## 2018-04-04 HISTORY — PX: TRANSURETHRAL RESECTION OF PROSTATE: SHX73

## 2018-04-04 SURGERY — CYSTOURETEROSCOPY, WITH STENT INSERTION
Anesthesia: General | Site: Ureter | Laterality: Right

## 2018-04-04 MED ORDER — MIDAZOLAM HCL 2 MG/2ML IJ SOLN
INTRAMUSCULAR | Status: AC
  Filled 2018-04-04: qty 2

## 2018-04-04 MED ORDER — PANTOPRAZOLE SODIUM 40 MG PO TBEC
40.0000 mg | DELAYED_RELEASE_TABLET | Freq: Every day | ORAL | Status: DC
  Administered 2018-04-04 – 2018-04-05 (×2): 40 mg via ORAL
  Filled 2018-04-04 (×2): qty 1

## 2018-04-04 MED ORDER — TIOTROPIUM BROMIDE MONOHYDRATE 18 MCG IN CAPS
18.0000 ug | ORAL_CAPSULE | Freq: Every day | RESPIRATORY_TRACT | Status: DC
  Administered 2018-04-05: 18 ug via RESPIRATORY_TRACT
  Filled 2018-04-04: qty 5

## 2018-04-04 MED ORDER — PHENYLEPHRINE HCL 10 MG/ML IJ SOLN
INTRAMUSCULAR | Status: AC
  Filled 2018-04-04: qty 1

## 2018-04-04 MED ORDER — DOCUSATE SODIUM 100 MG PO CAPS
100.0000 mg | ORAL_CAPSULE | Freq: Two times a day (BID) | ORAL | Status: DC
  Administered 2018-04-05: 100 mg via ORAL
  Filled 2018-04-04: qty 1

## 2018-04-04 MED ORDER — SODIUM CHLORIDE 0.9 % IR SOLN
3000.0000 mL | Status: DC
  Administered 2018-04-04 – 2018-04-05 (×4): 3000 mL

## 2018-04-04 MED ORDER — ONDANSETRON HCL 4 MG/2ML IJ SOLN
4.0000 mg | INTRAMUSCULAR | Status: DC | PRN
  Administered 2018-04-04: 4 mg via INTRAVENOUS
  Filled 2018-04-04 (×2): qty 2

## 2018-04-04 MED ORDER — ONDANSETRON HCL 4 MG/2ML IJ SOLN: 4.0000 mg | Freq: Once | INTRAMUSCULAR | Status: DC | PRN

## 2018-04-04 MED ORDER — LIDOCAINE HCL (PF) 2 % IJ SOLN
INTRAMUSCULAR | Status: AC
  Filled 2018-04-04: qty 10

## 2018-04-04 MED ORDER — LOSARTAN POTASSIUM 50 MG PO TABS
100.0000 mg | ORAL_TABLET | Freq: Every day | ORAL | Status: DC
  Administered 2018-04-04 – 2018-04-05 (×2): 100 mg via ORAL
  Filled 2018-04-04 (×2): qty 2

## 2018-04-04 MED ORDER — PHENYLEPHRINE HCL 10 MG/ML IJ SOLN
INTRAMUSCULAR | Status: DC | PRN
  Administered 2018-04-04: 150 ug via INTRAVENOUS
  Administered 2018-04-04 (×2): 100 ug via INTRAVENOUS
  Administered 2018-04-04: 50 ug via INTRAVENOUS
  Administered 2018-04-04 (×2): 100 ug via INTRAVENOUS
  Administered 2018-04-04: 150 ug via INTRAVENOUS
  Administered 2018-04-04 (×2): 100 ug via INTRAVENOUS

## 2018-04-04 MED ORDER — FAMOTIDINE 20 MG PO TABS
20.0000 mg | ORAL_TABLET | Freq: Every day | ORAL | Status: DC
  Administered 2018-04-04 – 2018-04-05 (×2): 20 mg via ORAL
  Filled 2018-04-04 (×2): qty 1

## 2018-04-04 MED ORDER — DIPHENHYDRAMINE HCL 12.5 MG/5ML PO ELIX
12.5000 mg | ORAL_SOLUTION | Freq: Four times a day (QID) | ORAL | Status: DC | PRN
  Filled 2018-04-04: qty 5

## 2018-04-04 MED ORDER — LACTATED RINGERS IV SOLN
INTRAVENOUS | Status: DC
  Administered 2018-04-04 (×2): via INTRAVENOUS

## 2018-04-04 MED ORDER — CEFAZOLIN SODIUM-DEXTROSE 1-4 GM/50ML-% IV SOLN
1.0000 g | Freq: Three times a day (TID) | INTRAVENOUS | Status: AC
  Administered 2018-04-05 (×2): 1 g via INTRAVENOUS
  Filled 2018-04-04 (×3): qty 50

## 2018-04-04 MED ORDER — FENTANYL CITRATE (PF) 100 MCG/2ML IJ SOLN: 25.0000 ug | INTRAMUSCULAR | Status: DC | PRN

## 2018-04-04 MED ORDER — AMLODIPINE BESYLATE 10 MG PO TABS
10.0000 mg | ORAL_TABLET | Freq: Every day | ORAL | Status: DC
  Administered 2018-04-05: 10 mg via ORAL
  Filled 2018-04-04: qty 1

## 2018-04-04 MED ORDER — FAMOTIDINE 20 MG PO TABS
ORAL_TABLET | ORAL | Status: AC
  Administered 2018-04-04: 20 mg via ORAL
  Filled 2018-04-04: qty 1

## 2018-04-04 MED ORDER — PROPOFOL 10 MG/ML IV BOLUS
INTRAVENOUS | Status: AC
  Filled 2018-04-04: qty 20

## 2018-04-04 MED ORDER — ROCURONIUM BROMIDE 100 MG/10ML IV SOLN
INTRAVENOUS | Status: DC | PRN
  Administered 2018-04-04: 10 mg via INTRAVENOUS
  Administered 2018-04-04: 40 mg via INTRAVENOUS
  Administered 2018-04-04 (×2): 10 mg via INTRAVENOUS

## 2018-04-04 MED ORDER — HEPARIN SODIUM (PORCINE) 5000 UNIT/ML IJ SOLN
5000.0000 [IU] | Freq: Three times a day (TID) | INTRAMUSCULAR | Status: DC
  Administered 2018-04-04 – 2018-04-05 (×3): 5000 [IU] via SUBCUTANEOUS
  Filled 2018-04-04 (×3): qty 1

## 2018-04-04 MED ORDER — IOPAMIDOL (ISOVUE-200) INJECTION 41%
INTRAVENOUS | Status: DC | PRN
  Administered 2018-04-04: 40 mL

## 2018-04-04 MED ORDER — SUGAMMADEX SODIUM 200 MG/2ML IV SOLN
INTRAVENOUS | Status: DC | PRN
  Administered 2018-04-04: 198.4 mg via INTRAVENOUS

## 2018-04-04 MED ORDER — CEFAZOLIN SODIUM-DEXTROSE 2-4 GM/100ML-% IV SOLN
INTRAVENOUS | Status: AC
  Filled 2018-04-04: qty 100

## 2018-04-04 MED ORDER — ACETAMINOPHEN 10 MG/ML IV SOLN
INTRAVENOUS | Status: AC
  Filled 2018-04-04: qty 100

## 2018-04-04 MED ORDER — HYDROCHLOROTHIAZIDE 25 MG PO TABS
12.5000 mg | ORAL_TABLET | Freq: Every day | ORAL | Status: DC
  Administered 2018-04-04 – 2018-04-05 (×2): 12.5 mg via ORAL
  Filled 2018-04-04 (×2): qty 1

## 2018-04-04 MED ORDER — FLUORESCEIN SODIUM 10 % IV SOLN
INTRAVENOUS | Status: AC
  Filled 2018-04-04: qty 5

## 2018-04-04 MED ORDER — FENTANYL CITRATE (PF) 100 MCG/2ML IJ SOLN
INTRAMUSCULAR | Status: AC
  Filled 2018-04-04: qty 2

## 2018-04-04 MED ORDER — BELLADONNA ALKALOIDS-OPIUM 16.2-60 MG RE SUPP
RECTAL | Status: AC
  Filled 2018-04-04: qty 1

## 2018-04-04 MED ORDER — FENTANYL CITRATE (PF) 100 MCG/2ML IJ SOLN
INTRAMUSCULAR | Status: DC | PRN
  Administered 2018-04-04: 50 ug via INTRAVENOUS
  Administered 2018-04-04: 100 ug via INTRAVENOUS
  Administered 2018-04-04: 50 ug via INTRAVENOUS

## 2018-04-04 MED ORDER — TAMSULOSIN HCL 0.4 MG PO CAPS
0.4000 mg | ORAL_CAPSULE | Freq: Every day | ORAL | Status: DC
  Administered 2018-04-04 – 2018-04-05 (×2): 0.4 mg via ORAL
  Filled 2018-04-04 (×2): qty 1

## 2018-04-04 MED ORDER — BELLADONNA ALKALOIDS-OPIUM 16.2-60 MG RE SUPP
1.0000 | Freq: Four times a day (QID) | RECTAL | Status: DC | PRN
  Administered 2018-04-04: 1 via RECTAL
  Filled 2018-04-04: qty 1

## 2018-04-04 MED ORDER — ONDANSETRON HCL 4 MG/2ML IJ SOLN
INTRAMUSCULAR | Status: AC
  Filled 2018-04-04: qty 2

## 2018-04-04 MED ORDER — MORPHINE SULFATE (PF) 2 MG/ML IV SOLN: 2.0000 mg | INTRAVENOUS | Status: DC | PRN

## 2018-04-04 MED ORDER — SEVOFLURANE IN SOLN
RESPIRATORY_TRACT | Status: AC
  Filled 2018-04-04: qty 250

## 2018-04-04 MED ORDER — BELLADONNA ALKALOIDS-OPIUM 16.2-60 MG RE SUPP
RECTAL | Status: AC
  Administered 2018-04-04: 1 via RECTAL
  Filled 2018-04-04: qty 1

## 2018-04-04 MED ORDER — ATORVASTATIN CALCIUM 20 MG PO TABS
20.0000 mg | ORAL_TABLET | Freq: Every day | ORAL | Status: DC
  Administered 2018-04-05: 20 mg via ORAL
  Filled 2018-04-04: qty 1

## 2018-04-04 MED ORDER — LIDOCAINE HCL (CARDIAC) PF 100 MG/5ML IV SOSY
PREFILLED_SYRINGE | INTRAVENOUS | Status: DC | PRN
  Administered 2018-04-04: 100 mg via INTRAVENOUS

## 2018-04-04 MED ORDER — OXYCODONE-ACETAMINOPHEN 5-325 MG PO TABS
1.0000 | ORAL_TABLET | ORAL | Status: DC | PRN
  Filled 2018-04-04: qty 2

## 2018-04-04 MED ORDER — ROCURONIUM BROMIDE 50 MG/5ML IV SOLN
INTRAVENOUS | Status: AC
  Filled 2018-04-04: qty 1

## 2018-04-04 MED ORDER — SODIUM CHLORIDE 0.9 % IV SOLN
INTRAVENOUS | Status: DC
  Administered 2018-04-04: 21:00:00 via INTRAVENOUS

## 2018-04-04 MED ORDER — FAMOTIDINE 20 MG PO TABS
20.0000 mg | ORAL_TABLET | Freq: Once | ORAL | Status: AC
  Administered 2018-04-04: 20 mg via ORAL

## 2018-04-04 MED ORDER — ACETAMINOPHEN 325 MG PO TABS: 650.0000 mg | ORAL_TABLET | ORAL | Status: DC | PRN

## 2018-04-04 MED ORDER — DIPHENHYDRAMINE HCL 50 MG/ML IJ SOLN: 12.5000 mg | Freq: Four times a day (QID) | INTRAMUSCULAR | Status: DC | PRN

## 2018-04-04 MED ORDER — OXYBUTYNIN CHLORIDE 5 MG PO TABS: 5.0000 mg | ORAL_TABLET | Freq: Three times a day (TID) | ORAL | Status: DC | PRN

## 2018-04-04 MED ORDER — PROPOFOL 10 MG/ML IV BOLUS
INTRAVENOUS | Status: DC | PRN
  Administered 2018-04-04: 160 mg via INTRAVENOUS

## 2018-04-04 MED ORDER — MIDAZOLAM HCL 2 MG/2ML IJ SOLN
INTRAMUSCULAR | Status: DC | PRN
  Administered 2018-04-04 (×2): 2 mg via INTRAVENOUS

## 2018-04-04 SURGICAL SUPPLY — 37 items
ADAPTER IRRIG TUBE 2 SPIKE SOL (ADAPTER) ×8 IMPLANT
BAG DRAIN CYSTO-URO LG1000N (MISCELLANEOUS) ×4 IMPLANT
BAG URO DRAIN 4000ML (MISCELLANEOUS) ×4 IMPLANT
BRUSH SCRUB EZ  4% CHG (MISCELLANEOUS) ×1
BRUSH SCRUB EZ 4% CHG (MISCELLANEOUS) ×3 IMPLANT
CATH FOL 2WAY LX 24X30 (CATHETERS) IMPLANT
CATH FOLEY 3WAY 30CC 22FR (CATHETERS) ×4 IMPLANT
CATH URETL 5X70 OPEN END (CATHETERS) ×4 IMPLANT
DRAPE UTILITY 15X26 TOWEL STRL (DRAPES) ×4 IMPLANT
DRSG TELFA 4X3 1S NADH ST (GAUZE/BANDAGES/DRESSINGS) ×4 IMPLANT
ELECT LOOP 22F BIPOLAR SML (ELECTROSURGICAL) ×4
ELECT REM PT RETURN 9FT ADLT (ELECTROSURGICAL) ×4
ELECTRODE LOOP 22F BIPOLAR SML (ELECTROSURGICAL) ×3 IMPLANT
ELECTRODE REM PT RTRN 9FT ADLT (ELECTROSURGICAL) ×3 IMPLANT
FORCEPS BIOP PIRANHA Y (CUTTING FORCEPS) ×4 IMPLANT
GLOVE BIO SURGEON STRL SZ 6.5 (GLOVE) ×4 IMPLANT
GOWN STRL REUS W/ TWL LRG LVL3 (GOWN DISPOSABLE) ×6 IMPLANT
GOWN STRL REUS W/TWL LRG LVL3 (GOWN DISPOSABLE) ×2
HOLDER FOLEY CATH W/STRAP (MISCELLANEOUS) ×8 IMPLANT
KIT TURNOVER CYSTO (KITS) ×4 IMPLANT
LOOP CUT BIPOLAR 24F LRG (ELECTROSURGICAL) IMPLANT
NDL SAFETY ECLIPSE 18X1.5 (NEEDLE) IMPLANT
NEEDLE HYPO 18GX1.5 SHARP (NEEDLE)
PACK CYSTO AR (MISCELLANEOUS) ×4 IMPLANT
SENSORWIRE 0.038 NOT ANGLED (WIRE) ×8
SET CYSTO W/LG BORE CLAMP LF (SET/KITS/TRAYS/PACK) ×4 IMPLANT
SET IRRIG Y TYPE TUR BLADDER L (SET/KITS/TRAYS/PACK) ×4 IMPLANT
SET IRRIGATING DISP (SET/KITS/TRAYS/PACK) ×4 IMPLANT
SOL .9 NS 3000ML IRR  AL (IV SOLUTION) ×7
SOL .9 NS 3000ML IRR UROMATIC (IV SOLUTION) ×21 IMPLANT
STENT URO INLAY 6FRX26CM (STENTS) ×4 IMPLANT
SURGILUBE 2OZ TUBE FLIPTOP (MISCELLANEOUS) ×4 IMPLANT
SYR 30ML LL (SYRINGE) ×4 IMPLANT
SYRINGE IRR TOOMEY STRL 70CC (SYRINGE) ×4 IMPLANT
WATER STERILE IRR 1000ML POUR (IV SOLUTION) ×4 IMPLANT
WATER STERILE IRR 3000ML UROMA (IV SOLUTION) ×4 IMPLANT
WIRE SENSOR 0.038 NOT ANGLED (WIRE) ×6 IMPLANT

## 2018-04-04 NOTE — Transfer of Care (Signed)
Immediate Anesthesia Transfer of Care Note  Patient: Tim[facility] Expose  Procedure(s) Performed: CYSTOSCOPY WITH URETEROSCOPY AND STENT PLACEMENT (Right Ureter) CYSTOSCOPY WITH TURBT (N/A Bladder) TRANSURETHRAL RESECTION OF THE PROSTATE (TURP) (N/A Prostate) URETERAL BIOPSY (Right Ureter)  Patient Location: PACU  Anesthesia Type:General  Level of Consciousness: awake and pateint uncooperative  Airway & Oxygen Therapy: Patient connected to nasal cannula oxygen and Patient connected to face mask oxygen  Post-op Assessment: Report given to RN and Post -op Vital signs reviewed and stable  Post vital signs: Reviewed and stable  Last Vitals:  Vitals Value Taken Time  BP    Temp    Pulse    Resp    SpO2      Last Pain:  Vitals:   04/04/18 1240  PainSc: 0-No pain         Complications: No apparent anesthesia complications

## 2018-04-04 NOTE — Op Note (Signed)
Date of procedure: 04/04/18  Preoperative diagnosis:  1. Nodular prostate 2. Bladder mass 3. Right hydroureteronephrosis  Postoperative diagnosis:  1. Same as above 2. Right ureteral mass  Procedure: 1. Cystoscopy 2. Right retrograde pyelogram 3. Right ureteroscopy with ureteral biopsy 4. Right ureteral stent placement 5. TURBT, small 6. TURP  Surgeon: Hollice Espy, MD  Anesthesia: General  Complications: None  Intraoperative findings: Grossly abnormal prostate with " fish egg" like appearance, raised bullous edema and distortion of right hemitrigone.  Nodular abnormal lesion within the right distal ureter biopsied.  Moderate hydroureteronephrosis down to this level.  EBL: Minimal  Specimens: Prostate chips, bladder mass, right distal ureteral biopsy  Drains: 6 x 26 French Bard Optima ureteral stent on right, 22 French three-way Foley catheter with 30 cc balloon  Indication: Steve Gilbert is a 67 y.o. patient with multiple concerning findings for neoplasm including nodular prostate (normal PSA), abnormal CT urogram with moderate hydroureteronephrosis down to level of the distal ureter concerning for obstruction, and abnormality on cystoscopy involving the right hemitrigone.  After reviewing the management options for treatment, he elected to proceed with the above surgical procedure(s). We have discussed the potential benefits and risks of the procedure, side effects of the proposed treatment, the likelihood of the patient achieving the goals of the procedure, and any potential problems that might occur during the procedure or recuperation. Informed consent has been obtained.  Description of procedure:  The patient was taken to the operating room and general anesthesia was induced. Prior to beginning the cystoscopic procedure, did perform a bimanual exam at which time the prostate was grossly abnormal and nodular but non-fixed. The patient was placed in the dorsal lithotomy  position, prepped and draped in the usual sterile fashion, and preoperative antibiotics were administered. A preoperative time-out was performed.    Male sounds were used to dilate the urethral meatus.  A 26 French resectoscope was advanced per urethra into the bladder.  Bladder was carefully inspected.  This revealed some raised bullous edema and distortion of the right hemitrigone.  The UO was eventually identified but was relatively occult initially.  The remainder of the bladder was unremarkable.  The area of abnormality measured approximately 1.5 cm without an obvious papillary appearance, rather it was raised bullous and erythematous.  The remainder of the bladder was unremarkable.  The left UO was normal with clear reflux of urine.  The prostatic fossa was also grossly abnormal with deposition of material that had a "fish egg" appearance involving the bladder neck all the way to the mid prostate.    First, using a bipolar resectoscope, the prostate adjacent to the bladder neck as well as prostatic tissue extending down towards the verumontanum including the abnormal mucosa was resected.  Resection of the bladder neck revealed grossly abnormal prostate tissue which was had a caseous necrosis type appearance.  After all visible abnormal prostatic mucosa was resected, the prostate chips specimen was passed off the field and hemostasis in the prostatic fossa was achieved.    Next, attention was turned to the right hemitrigone.  I was able to cannulate the ureteral orifice with a 5 [place] open-ended ureteral catheter before retrograde pyelogram.  There is very high-grade obstruction within the distal ureter and significant dilation with moderate hydroureteronephrosis with significant transition zone near this.  The area of defect measured approximately 1 cm in length.  He ultimately was able to get a wire around this up to level of the kidney.  A 5  [place] open-ended ureteral catheter was advanced over the  wire up to level the renal pelvis and additional contrast solution was injected to ensure that there were no filling defects in the upper tract which were not appreciated.  There was caliectasis.  Wire was then replaced.  Next, a 4.5 semirigid ureteroscope was advanced alongside of the wire using a railroad technique with a second wire in order to traverse the stenotic area into the dilated ureter.  The scope was then slowly backed through the area of abnormality which was noted to be significantly compressed there is also concern for a nodular whitish intraluminal tumor at this level as well and did not appear to have normal mucosa.  Parona forceps were used to biopsy to representative samples of this lesion highly concerning for malignancy.  Finally, the resectoscope was then replaced.  Bladder mucosa surrounding the ureteral orifice was resected down to level of the muscularis and the resection was extended to the bladder neck.  Careful hemostasis was then achieved.  This is passed off the field as right hemitrigone TURBT.  Finally, the safety wire was backloaded over rigid cystoscope.  With some difficulty, I ultimately was able to advance a 6 x 26 French double-J ureteral stent over the wire up to level the renal pelvis.  The wires partially drawn until focal stone within the renal pelvis.  The wire was then fully withdrawn a full coils noted within the bladder with some adjustment with the stent graspers.  The bladder was then drained.  A 22 French three-way Foley catheter with 30 cc balloon was in place and filled with sterile water.  The bladder was irrigated and there was only mild hematuria noted.  Patient was then placed on CBI.  He was reversed from anesthesia, taken the PACU in stable condition.  Plan: Patient will be admitted overnight for observation given the extent of the procedure today due to concern for clot retention.  We will stop CBI in the morning and plan for voiding  trial.  Notably today after completing the procedure, I did review his urine cytology results from the office which resulted this afternoon and are positive for malignancy.  Hollice Espy, M.D.

## 2018-04-04 NOTE — Progress Notes (Signed)
Pt. Arrived in PACU agitated trying to climb out of bed , nursing staff at bedside , versed / fentanyl adm. By CRNA in PACU .

## 2018-04-04 NOTE — Anesthesia Preprocedure Evaluation (Addendum)
Anesthesia Evaluation  Patient identified by MRN, date of birth, ID band Patient awake    Reviewed: Allergy & Precautions, NPO status , Patient's Chart, lab work & pertinent test results  History of Anesthesia Complications Negative for: history of anesthetic complications  Airway Mallampati: III       Dental   Pulmonary neg sleep apnea, COPD,  COPD inhaler, former smoker,           Cardiovascular hypertension, Pt. on medications (-) Past MI and (-) CHF (-) dysrhythmias (-) Valvular Problems/Murmurs     Neuro/Psych neg Seizures    GI/Hepatic Neg liver ROS, GERD  Medicated and Controlled,  Endo/Other  neg diabetes  Renal/GU negative Renal ROS     Musculoskeletal   Abdominal   Peds  Hematology   Anesthesia Other Findings   Reproductive/Obstetrics                            Anesthesia Physical Anesthesia Plan  ASA: II  Anesthesia Plan: General   Post-op Pain Management:    Induction: Intravenous  PONV Risk Score and Plan: 2 and Dexamethasone  Airway Management Planned: Oral ETT  Additional Equipment:   Intra-op Plan:   Post-operative Plan:   Informed Consent: I have reviewed the patients History and Physical, chart, labs and discussed the procedure including the risks, benefits and alternatives for the proposed anesthesia with the patient or authorized representative who has indicated his/her understanding and acceptance.     Plan Discussed with:   Anesthesia Plan Comments:         Anesthesia Quick Evaluation

## 2018-04-04 NOTE — Anesthesia Procedure Notes (Signed)
Procedure Name: Intubation Date/Time: 04/04/2018 3:05 PM Performed by: Allean Found, CRNA Pre-anesthesia Checklist: Patient identified, Patient being monitored, Timeout performed, Emergency Drugs available and Suction available Patient Re-evaluated:Patient Re-evaluated prior to induction Oxygen Delivery Method: Circle system utilized Preoxygenation: Pre-oxygenation with 100% oxygen Induction Type: IV induction Ventilation: Mask ventilation without difficulty Laryngoscope Size: Mac and 4 Grade View: Grade II Tube type: Oral Tube size: 7.5 mm Number of attempts: 1 Airway Equipment and Method: Stylet Placement Confirmation: ETT inserted through vocal cords under direct vision,  positive ETCO2 and breath sounds checked- equal and bilateral Secured at: 21 cm Tube secured with: Tape Dental Injury: Teeth and Oropharynx as per pre-operative assessment

## 2018-04-04 NOTE — Interval H&P Note (Signed)
History and Physical Interval Note:  04/04/2018 3:12 PM  Steve Gilbert  has presented today for surgery, with the diagnosis of right hydronephrosis, bladder lesion, prostate lesion  The various methods of treatment have been discussed with the patient and family. After consideration of risks, benefits and other options for treatment, the patient has consented to  Procedure(s): CYSTOSCOPY WITH URETEROSCOPY AND STENT PLACEMENT (Right) CYSTOSCOPY WITH BLADDER BIOPSY (N/A) TRANSURETHRAL RESECTION OF THE PROSTATE (TURP) (N/A) as a surgical intervention .  The patient's history has been reviewed, patient examined, no change in status, stable for surgery.  I have reviewed the patient's chart and labs.  Questions were answered to the patient's satisfaction.    RRR CTAB  Hollice Espy

## 2018-04-04 NOTE — Anesthesia Postprocedure Evaluation (Signed)
Anesthesia Post Note  Patient: [name]  Procedure(s) Performed: CYSTOSCOPY WITH URETEROSCOPY AND STENT PLACEMENT (Right Ureter) CYSTOSCOPY WITH TURBT (N/A Bladder) TRANSURETHRAL RESECTION OF THE PROSTATE (TURP) (N/A Prostate) URETERAL BIOPSY (Right Ureter)  Patient location during evaluation: PACU Anesthesia Type: General Level of consciousness: awake and alert Pain management: pain level controlled Vital Signs Assessment: post-procedure vital signs reviewed and stable Respiratory status: spontaneous breathing, nonlabored ventilation, respiratory function stable and patient connected to nasal cannula oxygen Cardiovascular status: blood pressure returned to baseline and stable Postop Assessment: no apparent nausea or vomiting Anesthetic complications: no     Last Vitals:  Vitals:   04/04/18 1837 04/04/18 1853  BP: 132/82 128/80  Pulse: 83 80  Resp: (!) 25 19  SpO2: 100% 100%    Last Pain:  Vitals:   04/04/18 1853  PainSc: [place]

## 2018-04-04 NOTE — Anesthesia Post-op Follow-up Note (Signed)
Anesthesia QCDR form completed.        

## 2018-04-05 ENCOUNTER — Encounter: Payer: Self-pay | Admitting: Urology

## 2018-04-05 DIAGNOSIS — C675 Malignant neoplasm of bladder neck: Secondary | ICD-10-CM | POA: Diagnosis not present

## 2018-04-05 LAB — CBC
HEMATOCRIT: 39 % — AB (ref 40.0–52.0)
Hemoglobin: 13.8 g/dL (ref 13.0–18.0)
MCH: 31.4 pg (ref 26.0–34.0)
MCHC: 35.4 g/dL (ref 32.0–36.0)
MCV: 88.6 fL (ref 80.0–100.0)
Platelets: 202 10*3/uL (ref 150–440)
RBC: 4.4 MIL/uL (ref 4.40–5.90)
RDW: 13.6 % (ref 11.5–14.5)
WBC: 7.1 10*3/uL (ref 3.8–10.6)

## 2018-04-05 LAB — BASIC METABOLIC PANEL
ANION GAP: 6 (ref 5–15)
BUN: 17 mg/dL (ref 8–23)
CHLORIDE: 102 mmol/L (ref 98–111)
CO2: 30 mmol/L (ref 22–32)
Calcium: 8.3 mg/dL — ABNORMAL LOW (ref 8.9–10.3)
Creatinine, Ser: 1.18 mg/dL (ref 0.61–1.24)
GFR calc non Af Amer: >60 mL/min (ref >60)
Glucose, Bld: 109 mg/dL — ABNORMAL HIGH (ref 70–99)
POTASSIUM: 4.1 mmol/L (ref 3.5–5.1)
Sodium: 138 mmol/L (ref 135–145)

## 2018-04-05 MED ORDER — HYDROCODONE-ACETAMINOPHEN 5-325 MG PO TABS: 1.0000 | ORAL_TABLET | Freq: Four times a day (QID) | ORAL | 0 refills | Status: DC | PRN

## 2018-04-05 MED ORDER — DOCUSATE SODIUM 100 MG PO CAPS: 100.0000 mg | ORAL_CAPSULE | Freq: Two times a day (BID) | ORAL | 0 refills | Status: DC

## 2018-04-05 MED ORDER — OXYBUTYNIN CHLORIDE 5 MG PO TABS: 5.0000 mg | ORAL_TABLET | Freq: Three times a day (TID) | ORAL | 0 refills | Status: DC | PRN

## 2018-04-05 MED ORDER — TAMSULOSIN HCL 0.4 MG PO CAPS: 0.4000 mg | ORAL_CAPSULE | Freq: Every day | ORAL | 0 refills | Status: DC

## 2018-04-05 NOTE — Progress Notes (Addendum)
Stopped CBI at 0600.Patient had incident of emesis on transfer from PACU- received Zofran. He felt improved and was able to eat sandwich tray and take nighttime meds. No c/o of pain throughout the night. CBI ran without difficulty throughout the night. VSS. Will continue to monitor

## 2018-04-05 NOTE — Discharge Summary (Signed)
Date of admission: 04/04/2018  Date of discharge: 04/05/2018  Admission diagnosis: bladder mass, right hydronephrosis, prostate lesion/ nodule, hematuria  Discharge diagnosis: same as above  Secondary diagnoses:  Patient Active Problem List   Diagnosis Date Noted  . Hydronephrosis of right kidney 04/04/2018  . Right inguinal hernia 04/03/2018  . Hyperlipidemia 04/03/2018  . Constipation 09/30/2015  . Vasomotor rhinitis 05/30/2014  . COPD, mild ([place]) 05/17/2014  . Essential hypertension 05/17/2014  . GERD without esophagitis 05/17/2014    History and Physical: For full details, please see admission history and physical. Briefly, Steve Gilbert is a 67 y.o. year old patient with multiple GU issues admitted following endoscopic diagnostic evaluation for overnight observation/ CBI.   Hospital Course: Patient tolerated the procedure well.  He was then transferred to the floor after an uneventful PACU stay.  His hospital course was uncomplicated.  On POD#1 he had met discharge criteria: was eating a regular diet, was up and ambulating independently,  pain was well controlled, was voiding without a catheter, and was ready to for discharge.  Physical Exam  Constitutional: He is oriented to person, place, and time. He appears well-developed and well-nourished.  HENT:  Head: Normocephalic and atraumatic.  Cardiovascular: Normal rate.  Pulmonary/Chest: Effort normal. No respiratory distress.  Abdominal: Soft.  Neurological: He is alert and oriented to person, place, and time.  Skin: Skin is warm and dry.  Psychiatric: He has a normal mood and affect.  Vitals reviewed.     Laboratory values:  Recent Labs    04/05/18 0527  WBC 7.1  HGB 13.8  HCT 39.0*   Recent Labs    04/05/18 0527  NA 138  K 4.1  CL 102  CO2 30  GLUCOSE 109*  BUN 17  CREATININE 1.18  CALCIUM 8.3*   No results for input(s): LABPT, INR in the last 72 hours. No results for input(s): LABURIN in the last  72 hours. Results for orders placed or performed in visit on 03/29/18  Microscopic Examination     Status: Abnormal   Collection Time: 03/29/18 11:31 AM  Result Value Ref Range Status   WBC, UA 6-10 (A) 0 - 5 /hpf Final   RBC, UA 3-10 (A) 0 - 2 /hpf Final   Epithelial Cells (non renal) None seen 0 - 10 /hpf Final   Bacteria, UA Few (A) None seen/Few Final  CULTURE, URINE COMPREHENSIVE     Status: None   Collection Time: 03/29/18  1:16 PM  Result Value Ref Range Status   Urine Culture, Comprehensive Final report  Final   Organism ID, Bacteria Comment  Final    Comment: Mixed urogenital flora 3,000 Colonies/mL     Disposition: Home  Discharge instruction: See EPIC  Discharge medications:  Allergies as of 04/05/2018      Reactions   Contrast Media [iodinated Diagnostic Agents]       Medication List    TAKE these medications   amLODipine 10 MG tablet Commonly known as:  NORVASC Take 10 mg by mouth daily.   atorvastatin 20 MG tablet Commonly known as:  LIPITOR Take 20 mg by mouth daily.   diphenhydrAMINE 50 MG tablet Commonly known as:  BENADRYL Take 1 tablet (50 mg total) by mouth once for 1 dose. 1 tablet 1 hour prior to CTscan   docusate sodium 100 MG capsule Commonly known as:  COLACE Take 1 capsule (100 mg total) by mouth 2 (two) times daily.   hydrochlorothiazide 12.5 MG tablet Commonly  known as:  HYDRODIURIL TAKE 1 TABLET PO QD   HYDROcodone-acetaminophen 5-325 MG tablet Commonly known as:  NORCO/VICODIN Take 1-2 tablets by mouth every 6 (six) hours as needed for moderate pain.   losartan 100 MG tablet Commonly known as:  COZAAR TK 1 T PO QD   meloxicam 15 MG tablet Commonly known as:  MOBIC Take 1 tablet by mouth 1 day or 1 dose.   oxybutynin 5 MG tablet Commonly known as:  DITROPAN Take 1 tablet (5 mg total) by mouth every 8 (eight) hours as needed for bladder spasms.   pantoprazole 40 MG tablet Commonly known as:  PROTONIX Take 40 mg by mouth  daily.   ranitidine 150 MG tablet Commonly known as:  ZANTAC Take 1 tablet (150 mg total) by mouth once for 1 dose. Take 1 tablet 1 hour prior to CTscan   tamsulosin 0.4 MG Caps capsule Commonly known as:  FLOMAX Take 1 capsule (0.4 mg total) by mouth daily. What changed:  Another medication with the same name was added. Make sure you understand how and when to take each.   tamsulosin 0.4 MG Caps capsule Commonly known as:  FLOMAX Take 1 capsule (0.4 mg total) by mouth daily. What changed:  You were already taking a medication with the same name, and this prescription was added. Make sure you understand how and when to take each.   tiotropium 18 MCG inhalation capsule Commonly known as:  SPIRIVA Place 18 mcg into inhaler and inhale as needed.       Followup:  Follow-up Information    Hollice Espy, MD.   Specialty:  Urology Why:  next week as scheduled Contact information: [address] [address] [phone]

## 2018-04-05 NOTE — Care Management Obs Status (Signed)
[facility] NOTIFICATION   Patient Details  Name: CAINE BARFIELD MRN: [id number] Date of Birth: January 26, 1951   Medicare Observation Status Notification Given:  No(admitted obs less than 24 hours)    Beverly Sessions, RN 04/05/2018, 4:26 PM

## 2018-04-05 NOTE — Plan of Care (Signed)
  Problem: Education: Goal: Knowledge of General Education information will improve Description Including pain rating scale, medication(s)/side effects and non-pharmacologic comfort measures Outcome: Progressing   Problem: Health Behavior/Discharge Planning: Goal: Ability to manage health-related needs will improve Outcome: Progressing   

## 2018-04-05 NOTE — Discharge Instructions (Signed)
You have a ureteral stent in place.  This is a tube that extends from your kidney to your bladder.  This may cause urinary bleeding, burning with urination, and urinary frequency.  Please call our office or present to the ED if you develop fevers >101 or pain which is not able to be controlled with oral pain medications.  You may be given either Flomax and/ or ditropan to help with bladder spasms and stent pain in addition to pain medications.    Transurethral Resection of Bladder Tumor (TURBT) or Bladder Biopsy   Definition:  Transurethral Resection of the Bladder Tumor is a surgical procedure used to diagnose and remove tumors within the bladder. TURBT is the most common treatment for early stage bladder cancer.  General instructions:     Your recent bladder surgery requires very little post hospital care but some definite precautions.  Despite the fact that no skin incisions were used, the area around the bladder incisions are raw and covered with scabs to promote healing and prevent bleeding. Certain precautions are needed to insure that the scabs are not disturbed over the next 2-4 weeks while the healing proceeds.  Because the raw surface inside your bladder and the irritating effects of urine you may expect frequency of urination and/or urgency (a stronger desire to urinate) and perhaps even getting up at night more often. This will usually resolve or improve slowly over the healing period. You may see some blood in your urine over the first 6 weeks. Do not be alarmed, even if the urine was clear for a while. Get off your feet and drink lots of fluids until clearing occurs. If you start to pass clots or don't improve call us.  Diet:  You may return to your normal diet immediately. Because of the raw surface of your bladder, alcohol, spicy foods, foods high in acid and drinks with caffeine may cause irritation or frequency and should be used in moderation. To keep your urine flowing freely  and avoid constipation, drink plenty of fluids during the day (8-10 glasses). Tip: Avoid cranberry juice because it is very acidic.  Activity:  Your physical activity doesn't need to be restricted. However, if you are very active, you may see some blood in the urine. We suggest that you reduce your activity under the circumstances until the bleeding has stopped.  Bowels:  It is important to keep your bowels regular during the postoperative period. Straining with bowel movements can cause bleeding. A bowel movement every other day is reasonable. Use a mild laxative if needed, such as milk of magnesia 2-3 tablespoons, or 2 Dulcolax tablets. Call if you continue to have problems. If you had been taking narcotics for pain, before, during or after your surgery, you may be constipated. Take a laxative if necessary.    Medication:  You should resume your pre-surgery medications unless told not to. In addition you may be given an antibiotic to prevent or treat infection. Antibiotics are not always necessary. All medication should be taken as prescribed until the bottles are finished unless you are having an unusual reaction to one of the drugs.   [facility] [address], [place], [place] [address] [phone]

## 2018-04-06 ENCOUNTER — Other Ambulatory Visit: Payer: Self-pay | Admitting: Anatomic Pathology & Clinical Pathology

## 2018-04-09 ENCOUNTER — Telehealth: Payer: Self-pay | Admitting: Urology

## 2018-04-09 DIAGNOSIS — C675 Malignant neoplasm of bladder neck: Secondary | ICD-10-CM

## 2018-04-09 LAB — SURGICAL PATHOLOGY

## 2018-04-09 NOTE — Telephone Encounter (Signed)
Called patient to discuss pathology briefly I will discuss this in more detail at his visit on Thursday.  Referral to medical oncology as he will likely need neoadjuvant chemotherapy.  Additionally, I went ahead and ordered a chest CT.  Patient is aware of those 2 things which hopefully will be arranged for ASAP.  In addition, he will be discussed at tumor board this Thursday.  Hollice Espy, MD

## 2018-04-10 NOTE — Telephone Encounter (Signed)
Referral sent to the cancer center and I will have scheduling call to get his ct scheduled before Thursday.  Sharyn Lull

## 2018-04-11 ENCOUNTER — Ambulatory Visit
Admission: RE | Admit: 2018-04-11 | Discharge: 2018-04-11 | Disposition: A | Discharge status: Home / Self Care | Payer: PPO | Source: Ambulatory Visit | Attending: Urology | Admitting: Urology

## 2018-04-11 DIAGNOSIS — C675 Malignant neoplasm of bladder neck: Secondary | ICD-10-CM | POA: Diagnosis not present

## 2018-04-11 DIAGNOSIS — J438 Other emphysema: Secondary | ICD-10-CM | POA: Insufficient documentation

## 2018-04-11 DIAGNOSIS — J432 Centrilobular emphysema: Secondary | ICD-10-CM | POA: Insufficient documentation

## 2018-04-12 ENCOUNTER — Ambulatory Visit: Payer: PPO | Admitting: Urology

## 2018-04-12 ENCOUNTER — Encounter: Payer: Self-pay | Admitting: Urology

## 2018-04-12 ENCOUNTER — Ambulatory Visit (INDEPENDENT_AMBULATORY_CARE_PROVIDER_SITE_OTHER): Payer: PPO | Admitting: Urology

## 2018-04-12 VITALS — BP 138/87 | HR 94 | Ht 67.0 in | Wt 205.0 lb

## 2018-04-12 DIAGNOSIS — N402 Nodular prostate without lower urinary tract symptoms: Secondary | ICD-10-CM

## 2018-04-12 DIAGNOSIS — N1330 Unspecified hydronephrosis: Secondary | ICD-10-CM

## 2018-04-12 DIAGNOSIS — C675 Malignant neoplasm of bladder neck: Secondary | ICD-10-CM

## 2018-04-12 NOTE — Progress Notes (Signed)
04/12/2018 5:33 PM   Steve Gilbert Apr 10, 1951 [id number]  Referring provider: Kirk Ruths, MD [facility] [place], [place] [address]  Chief Complaint  Patient presents with  . Bladder Cancer    discussion    HPI: 67 year old male who initially presented with hematuria and penile pain ultimately found to have right-sided hydroureteronephrosis secondary to an obstructing trigonal/bladder neck mass.  He was most recently taken to the operating room on 04/04/2018 for right ureteroscopy, biopsy of right distal ureteral mass, TURBT as well as TURP in the setting of a nodular "fish egg" like appearance of the prostatic fossa extending from the bladder neck through the prostatic urethra.  Pathology within the bladder portion consistent with invasive urothelial carcinoma, high-grade involving bladder neck muscularis propria.  The tumor itself did not invade the prostate tissue.  The right hemitrigone biopsy showed CIS as well as an area of concerning invasive carcinoma, at least into the lamina propria.  Right distal ureteral biopsy was consistent with inflammation and granulation tissue.  The above pathology was discussed with the patient by telephone and he was scheduled for chest CT which was negative for any evidence of metastatic disease.  Incidental emphysema was appreciated.  Currently has an indwelling Bard optima ureteral stent on the right.  He is tolerating this very well.  No previous abd surgeries.    PMH: Past Medical History:  Diagnosis Date  . Acid reflux   . Allergy   . Cancer ([place])   . COPD (chronic obstructive pulmonary disease) ([place])   . High cholesterol   . Hyperlipidemia 04/03/2018  . Hypertension   . Pneumonia     Surgical History: Past Surgical History:  Procedure Laterality Date  . CYSTOSCOPY WITH BIOPSY N/A 04/04/2018   Procedure: CYSTOSCOPY WITH TURBT;  Surgeon: Hollice Espy, MD;  Location: [facility];  Service:  Urology;  Laterality: N/A;  . CYSTOSCOPY WITH URETEROSCOPY AND STENT PLACEMENT Right 04/04/2018   Procedure: CYSTOSCOPY WITH URETEROSCOPY AND STENT PLACEMENT;  Surgeon: Hollice Espy, MD;  Location: [facility];  Service: Urology;  Laterality: Right;  . NASAL SINUS SURGERY    . PORTA CATH INSERTION N/A 04/23/2018   Procedure: PORTA CATH INSERTION;  Surgeon: Algernon Huxley, MD;  Location: [place] [facility];  Service: Cardiovascular;  Laterality: N/A;  . TRANSURETHRAL RESECTION OF PROSTATE N/A 04/04/2018   Procedure: TRANSURETHRAL RESECTION OF THE PROSTATE (TURP);  Surgeon: Hollice Espy, MD;  Location: [facility];  Service: Urology;  Laterality: N/A;  . URETERAL BIOPSY Right 04/04/2018   Procedure: URETERAL BIOPSY;  Surgeon: Hollice Espy, MD;  Location: [facility];  Service: Urology;  Laterality: Right;    Home Medications:  Allergies as of 04/12/2018      Reactions   Contrast Media [iodinated Diagnostic Agents]       Medication List        Accurate as of 04/12/18 11:59 PM. Always use your most recent med list.          amLODipine 10 MG tablet Commonly known as:  NORVASC Take 10 mg by mouth daily.   atorvastatin 20 MG tablet Commonly known as:  LIPITOR Take 20 mg by mouth daily.   diphenhydrAMINE 50 MG tablet Commonly known as:  BENADRYL Take 1 tablet (50 mg total) by mouth once for 1 dose. 1 tablet 1 hour prior to CTscan   docusate sodium 100 MG capsule Commonly known as:  COLACE Take 1 capsule (100 mg total) by  mouth 2 (two) times daily.   hydrochlorothiazide 12.5 MG tablet Commonly known as:  HYDRODIURIL TAKE 1 TABLET PO QD   HYDROcodone-acetaminophen 5-325 MG tablet Commonly known as:  NORCO/VICODIN Take 1-2 tablets by mouth every 6 (six) hours as needed for moderate pain.   losartan 100 MG tablet Commonly known as:  COZAAR TK 1 T PO QD   losartan-hydrochlorothiazide 100-12.5 MG tablet Commonly known as:  HYZAAR losartan 100 mg-hydrochlorothiazide 12.5 mg  tablet  TK 1 T PO QD   meloxicam 15 MG tablet Commonly known as:  MOBIC Take 1 tablet by mouth 1 day or 1 dose.   oxybutynin 5 MG tablet Commonly known as:  DITROPAN Take 1 tablet (5 mg total) by mouth every 8 (eight) hours as needed for bladder spasms.   pantoprazole 40 MG tablet Commonly known as:  PROTONIX Take 40 mg by mouth daily.   ranitidine 150 MG tablet Commonly known as:  ZANTAC Take 1 tablet (150 mg total) by mouth once for 1 dose. Take 1 tablet 1 hour prior to CTscan   tamsulosin 0.4 MG Caps capsule Commonly known as:  FLOMAX Take 1 capsule (0.4 mg total) by mouth daily.   tamsulosin 0.4 MG Caps capsule Commonly known as:  FLOMAX Take 1 capsule (0.4 mg total) by mouth daily.   tiotropium 18 MCG inhalation capsule Commonly known as:  SPIRIVA Place 18 mcg into inhaler and inhale daily.       Allergies:  Allergies  Allergen Reactions  . Contrast Media [Iodinated Diagnostic Agents]     Family History: Family History  Problem Relation Age of Onset  . Diabetes Mother   . Heart disease Father   . Diabetes Brother   . Heart attack Brother   . Heart disease Brother     Social History:  reports that he quit smoking about 10 years ago. His smoking use included cigarettes. He has a 45.00 pack-year smoking history. He has quit using smokeless tobacco. He reports that he drinks about 7.0 standard drinks of alcohol per week. He reports that he has current or past drug history.  ROS: 12 Point review of systems is negative other than as per HPI.  Physical Exam: BP 138/87   Pulse 94   Ht 5\' 7"  (1.702 m)   Wt 205 lb (93 kg)   BMI 32.11 kg/m   Constitutional:  Alert and oriented, No acute distress. HEENT: [place] AT, moist mucus membranes.  Trachea midline, no masses. Cardiovascular: No clubbing, cyanosis, or edema. Respiratory: Normal respiratory effort, no increased work of breathing. Skin: No rashes, bruises or suspicious lesions. Neurologic: Grossly intact,  no focal deficits, moving all 4 extremities. Psychiatric: Normal mood and affect.  Laboratory Data: Lab Results  Component Value Date   WBC 1.5 (L) 05/02/2018   HGB 13.3 05/02/2018   HCT 38.8 (L) 05/02/2018   MCV 87.8 05/02/2018   PLT 139 (L) 05/02/2018    Lab Results  Component Value Date   CREATININE 1.28 (H) 05/02/2018    Urinalysis NA  Imaging: Results for orders placed during the hospital encounter of 03/09/18  CT HEMATURIA WORKUP   Narrative CLINICAL DATA:  Painless microscopic hematuria for 1 month. Patient indicates clinical concern of prostate cancer. No history of malignancy.  EXAM: CT ABDOMEN AND PELVIS WITHOUT AND WITH CONTRAST  TECHNIQUE: Multidetector CT imaging of the abdomen and pelvis was performed following the standard protocol before and following the bolus administration of intravenous contrast. The patient was premedicated according to  the 13hour steroid and Benadryl prep due to a history of iodinated contrast allergy. There were no immediate complications.  CONTRAST:  154mL ISOVUE-300 IOPAMIDOL (ISOVUE-300) INJECTION 61%  COMPARISON:  Abdominal ultrasound 11/05/2006  FINDINGS: Lower chest: There is mild linear atelectasis or scarring at both lung bases. Coronary artery atherosclerosis is present. There is no significant pleural or pericardial effusion.  Hepatobiliary: There are scattered subcentimeter low-density lesions throughout the liver which persist on the delayed images, likely cysts. No suspicious hepatic findings. The gallbladder is incompletely distended. No evidence of gallstones, gallbladder wall thickening or biliary dilatation.  Pancreas: Unremarkable. No pancreatic ductal dilatation or surrounding inflammatory changes.  Spleen: Normal in size without focal abnormality.  Adrenals/Urinary Tract: Both adrenal glands appear normal. Pre contrast images demonstrate calcification within the wall of a dilated calyx in the mid  right kidney, measuring 6 mm on image 32/2. No other urinary tract calcifications are identified. There is moderate right-sided hydronephrosis and hydroureter. The right ureter is dilated to the ureterovesical junction where there is asymmetric wall thickening of the right bladder wall near the trigone (images 70-73/4). No discrete ureteral or bladder mass identified. The right ureter is partially obstructed, although contrast extends into the ureter on the delayed images. There is cortical scarring in the interpolar region of the right kidney. The left kidney and left collecting system appear normal.  Stomach/Bowel: No evidence of bowel wall thickening, distention or surrounding inflammatory change. The appendix appears normal. Mild sigmoid diverticulosis.  Vascular/Lymphatic: There are no enlarged abdominal or pelvic lymph nodes. Aortic and branch vessel atherosclerosis.  Reproductive: The prostate gland is mildly enlarged and heterogeneous. The prostate gland is contiguous with the asymmetric right bladder wall thickening at the trigone. There is asymmetric low density in the right seminal vesicle which is not significantly enlarged.  Other: Moderate size right inguinal hernia containing only fat. No ascites.  Musculoskeletal: No acute or significant osseous findings. Mild lumbar spondylosis.  IMPRESSION: 1. Findings are consistent with chronic low-grade obstruction at the right ureterovesical junction. No clear benign etiology is demonstrated, and although this could be due to a benign stricture, cystoscopy is recommended to exclude bladder cancer and prostate cancer as the etiology. 2. Cortical scarring in the right kidney with possible single calculus in the wall of a calyx in the mid kidney. No other urinary tract calculi. 3. No evidence of metastatic disease. 4.  Aortic Atherosclerosis (ICD10-I70.0). 5. Right inguinal hernia containing fat.   Electronically  Signed   By: Richardean Sale M.D.   On: 03/09/2018 10:02    CLINICAL DATA:  Bladder cancer, staging  EXAM: CT CHEST WITHOUT CONTRAST  TECHNIQUE: Multidetector CT imaging of the chest was performed following the standard protocol without IV contrast.  COMPARISON:  None.  FINDINGS: Cardiovascular: Heart is normal in size.  No pericardial effusion.  No evidence of thoracic aortic aneurysm.  Mild three-vessel coronary atherosclerosis.  Mediastinum/Nodes: No suspicious mediastinal lymphadenopathy.  Visualized thyroid is unremarkable.  Lungs/Pleura: Mild scarring/atelectasis in the bilateral lower lobes.  No suspicious pulmonary nodules.  Mild centrilobular and paraseptal emphysematous changes, upper lobe predominant.  No focal consolidation.  No pleural effusion or pneumothorax.  Upper Abdomen: Visualized upper abdomen is notable for a right ureteral stent.  Musculoskeletal: Degenerative changes of the thoracic spine.  IMPRESSION: No evidence of metastatic disease in the chest.  Emphysema (ICD10-J43.9).   Electronically Signed   By: Julian Hy M.D.   On: 04/11/2018 14:08  Above CT imaging was personally reviewed today.  With radiologic interpretation.  Assessment & Plan:    1. Malignant neoplasm of urinary bladder neck (HCC) Newly diagnosed muscle invasive urothelial carcinoma involving right hemitrigone, bladder neck and extending into the bladder without obvious prostatic invasion per pathology Given his age, I would most strongly advocate for neoadjuvant chemotherapy followed by cystoprostatectomy He is agreeable this plan Referral to medical oncology was Artie previously made I will refer [facility] to [facility] in [place] to discuss cystoprostatectomy once his chemotherapy has been completed Briefly discussed cystoprostatectomy today and will be discussed further in the future Will discuss at tumor board - Ambulatory  referral to Urology  2. Hydronephrosis of right kidney Indwelling right ureteral stent, Bard optima with low risk for encrustation Leave stent in setting of obstruction to optima his renal function for chemotherapy We will leave in place and remove likely at the time of surgery, will defer to [facility] on timing of this  3. Prostate nodule Abnormal rectal exam May be secondary to above process versus second malignancy vs. BPH In light of pursuing cystoprostatectomy, will not pursue any further work-up or intervention  Hollice Espy, MD  [place] [address], [place],  [address] [phone]  I spent 25 min with this patient of which greater than 50% was spent in counseling and coordination of care with the patient.

## 2018-04-16 ENCOUNTER — Encounter: Payer: Self-pay | Admitting: Internal Medicine

## 2018-04-16 ENCOUNTER — Inpatient Hospital Stay: Payer: PPO | Attending: Internal Medicine | Admitting: Internal Medicine

## 2018-04-16 VITALS — BP 135/82 | HR 81 | Temp 97.2°F | Resp 16 | Wt 217.0 lb

## 2018-04-16 DIAGNOSIS — C675 Malignant neoplasm of bladder neck: Secondary | ICD-10-CM | POA: Insufficient documentation

## 2018-04-16 DIAGNOSIS — Z87891 Personal history of nicotine dependence: Secondary | ICD-10-CM

## 2018-04-16 DIAGNOSIS — E785 Hyperlipidemia, unspecified: Secondary | ICD-10-CM | POA: Insufficient documentation

## 2018-04-16 DIAGNOSIS — N1339 Other hydronephrosis: Secondary | ICD-10-CM | POA: Diagnosis not present

## 2018-04-16 DIAGNOSIS — J449 Chronic obstructive pulmonary disease, unspecified: Secondary | ICD-10-CM | POA: Diagnosis not present

## 2018-04-16 DIAGNOSIS — I10 Essential (primary) hypertension: Secondary | ICD-10-CM | POA: Insufficient documentation

## 2018-04-16 DIAGNOSIS — Z79899 Other long term (current) drug therapy: Secondary | ICD-10-CM

## 2018-04-16 DIAGNOSIS — Z9079 Acquired absence of other genital organ(s): Secondary | ICD-10-CM | POA: Diagnosis not present

## 2018-04-16 DIAGNOSIS — Z5181 Encounter for therapeutic drug level monitoring: Secondary | ICD-10-CM

## 2018-04-16 DIAGNOSIS — Z91041 Radiographic dye allergy status: Secondary | ICD-10-CM

## 2018-04-16 NOTE — Assessment & Plan Note (Addendum)
#  Transitional cell bladder cancer pT2/ cT4 cN0- [question involving prostate]- stage II/III.    #Recommend neoadjuvant chemotherapy with dose dense MVAC chemotherapy.  Discussed the each individual chemotherapy drugs; discussed goal is cure.  Would recommend 3-4 cycles of chemotherapy; prior to proceeding with definitive cystectomy.  Patient is awaiting evaluation with urology at [facility] for cystectomy evaluation.  Discussed the potential side effects including but not limited to-increasing fatigue, nausea vomiting, diarrhea, hair loss, sores in the mouth, increase risk of infection and also neuropathy. Also discussed regarding potential side effects of heart failure/leukemia with Adriamycin.  # Growth factor-Neulasta/On pro would be given as prophylaxis for chemotherapy-induced neutropenia to prevent febrile neutropenias.   #Right-sided hydronephrosis-secondary tumor-status post stenting.  Recent creatinine 1.1/GFR greater than 60.   #Recommend port placement 2D echo.  Chemotherapy education.   #Plan labs with chemotherapy/next week MD.   Thank you Dr.Brandon for allowing me to participate in the care of your pleasant patient. Please do not hesitate to contact me with questions or concerns in the interim.  Cc; Dr.Brandon/Dr.Anderson.

## 2018-04-16 NOTE — Progress Notes (Signed)
[place] NOTE  Patient Care Team: Kirk Ruths, MD as PCP - General (Internal Medicine)  CHIEF COMPLAINTS/PURPOSE OF CONSULTATION:  Bladder cancer  #  Oncology History   # BLADDER CANCER: pT2/cpT4  prostate/bladder neck Dr.Brandon TURP s/p  - INVASIVE UROTHELIAL CARCINOMA, HIGH-GRADE;  TUMOR INVADES BLADDER NECK MUSCULARIS PROPRIA. Mild-mod Right hydronephrosis [place]   #    # COPD/ not on O2; [quit 2009];    DIAGNOSIS: BLADDER CA  STAGE:  PT2/cT4 cN0    ;GOALS: cure  CURRENT/MOST RECENT THERAPY : MVAC dd      Cancer of bladder neck ([place])   04/25/2018 -  Chemotherapy    The patient had DOXOrubicin (ADRIAMYCIN) chemo injection 64 mg, 30 mg/m2, Intravenous,  Once, 0 of 4 cycles PALONOSETRON HCL INJECTION 0.25 MG/5ML, 0.25 mg, Intravenous,  Once, 0 of 4 cycles pegfilgrastim (NEULASTA ONPRO KIT) injection 6 mg, 6 mg, Subcutaneous, Once, 0 of 4 cycles methotrexate (PF) 64.75 mg in sodium chloride 0.9 % 50 mL chemo infusion, 30 mg/m2, Intravenous,  Once, 0 of 4 cycles CISplatin (PLATINOL) 151 mg in sodium chloride 0.9 % 500 mL chemo infusion, 70 mg/m2, Intravenous,  Once, 0 of 4 cycles ONDANSETRON IVPB [facility] +/- DEXAMETHASONE, , Intravenous,  Once, 0 of 4 cycles vinBLAStine (VELBAN) 6.5 mg in sodium chloride 0.9 % 50 mL chemo infusion, 3 mg/m2, Intravenous, Once, 0 of 4 cycles FOSAPREPITANT 150MG + DEXAMETHASONE INFUSION [facility], , Intravenous,  Once, 0 of 4 cycles  for chemotherapy treatment.       HISTORY OF PRESENTING ILLNESS:  Steve Gilbert 67 y.o.  male has been referred to [name] for further evaluation recommendations for newly diagnosed bladder cancer.    Patient states that he had noted to have intermittent hematuria for the last 2 to 3 months.  Also noted to have pain in his penile area.  Evaluated by a urinalysis; and further led to evaluation with CT urogram/ urology including cystoscopy.  Patient had a cystoscopy and  TURBT-that showed tumor in the trigone area; and also narrowing of the right ureteral orifice.  Patient underwent stent placement.  Patient baseline creatinine around 1.1  Patient denies any tingling or numbness.  No nausea no vomiting.  Appetite is good.  No weight loss.  Review of Systems  Constitutional: Negative for chills, diaphoresis, fever, malaise/fatigue and weight loss.  HENT: Negative for nosebleeds and sore throat.   Eyes: Negative for double vision.  Respiratory: Negative for cough, hemoptysis, sputum production, shortness of breath and wheezing.   Cardiovascular: Negative for chest pain, palpitations, orthopnea and leg swelling.  Gastrointestinal: Negative for abdominal pain, blood in stool, constipation, diarrhea, heartburn, melena, nausea and vomiting.  Genitourinary: Positive for hematuria. Negative for dysuria, frequency and urgency.  Musculoskeletal: Negative for back pain and joint pain.  Skin: Negative.  Negative for itching and rash.  Neurological: Negative for dizziness, tingling, focal weakness, weakness and headaches.  Endo/Heme/Allergies: Does not bruise/bleed easily.  Psychiatric/Behavioral: Negative for depression. The patient is not nervous/anxious and does not have insomnia.      MEDICAL HISTORY:  Past Medical History:  Diagnosis Date  . Acid reflux   . Allergy   . Cancer ([place])   . COPD (chronic obstructive pulmonary disease) ([place])   . High cholesterol   . Hyperlipidemia 04/03/2018  . Hypertension   . Pneumonia     SURGICAL HISTORY: Past Surgical History:  Procedure Laterality Date  . CYSTOSCOPY WITH BIOPSY N/A 04/04/2018  Procedure: CYSTOSCOPY WITH TURBT;  Surgeon: Hollice Espy, MD;  Location: [facility] ORS;  Service: Urology;  Laterality: N/A;  . CYSTOSCOPY WITH URETEROSCOPY AND STENT PLACEMENT Right 04/04/2018   Procedure: CYSTOSCOPY WITH URETEROSCOPY AND STENT PLACEMENT;  Surgeon: Hollice Espy, MD;  Location: [facility] ORS;  Service: Urology;   Laterality: Right;  . NASAL SINUS SURGERY    . TRANSURETHRAL RESECTION OF PROSTATE N/A 04/04/2018   Procedure: TRANSURETHRAL RESECTION OF THE PROSTATE (TURP);  Surgeon: Hollice Espy, MD;  Location: [facility] ORS;  Service: Urology;  Laterality: N/A;  . URETERAL BIOPSY Right 04/04/2018   Procedure: URETERAL BIOPSY;  Surgeon: Hollice Espy, MD;  Location: [facility] ORS;  Service: Urology;  Laterality: Right;    SOCIAL HISTORY: pt lives with his wife; currently retired.  Works part-time.  Remote history of smoking.  No alcohol. Social History   Socioeconomic History  . Marital status: Married    Spouse name: Not on file  . Number of children: Not on file  . Years of education: Not on file  . Highest education level: Not on file  Occupational History  . Not on file  Social Needs  . Financial resource strain: Not on file  . Food insecurity:    Worry: Not on file    Inability: Not on file  . Transportation needs:    Medical: Not on file    Non-medical: Not on file  Tobacco Use  . Smoking status: Former Smoker    Packs/day: 1.50    Years: 30.00    Pack years: 45.00    Types: Cigarettes    Last attempt to quit: 2009    Years since quitting: 10.7  . Smokeless tobacco: Former Network engineer and Sexual Activity  . Alcohol use: Yes    Alcohol/week: 7.0 standard drinks    Types: 5 Cans of beer, 2 Standard drinks or equivalent per week    Comment: ocassional   . Drug use: Not Currently  . Sexual activity: Not on file  Lifestyle  . Physical activity:    Days per week: Not on file    Minutes per session: Not on file  . Stress: Not on file  Relationships  . Social connections:    Talks on phone: Not on file    Gets together: Not on file    Attends religious service: Not on file    Active member of club or organization: Not on file    Attends meetings of clubs or organizations: Not on file    Relationship status: Not on file  . Intimate partner violence:    Fear of current or ex  partner: Not on file    Emotionally abused: Not on file    Physically abused: Not on file    Forced sexual activity: Not on file  Other Topics Concern  . Not on file  Social History Narrative  . Not on file    FAMILY HISTORY: Family History  Problem Relation Age of Onset  . Diabetes Mother   . Heart disease Father   . Diabetes Brother   . Heart attack Brother   . Heart disease Brother     ALLERGIES:  is allergic to contrast media [iodinated diagnostic agents].  MEDICATIONS:  Current Outpatient Medications  Medication Sig Dispense Refill  . amLODipine (NORVASC) 10 MG tablet Take 10 mg by mouth daily.    Marland Kitchen atorvastatin (LIPITOR) 20 MG tablet Take 20 mg by mouth daily.    . diphenhydrAMINE (BENADRYL) 50 MG tablet Take  1 tablet (50 mg total) by mouth once for 1 dose. 1 tablet 1 hour prior to CTscan 1 tablet 0  . docusate sodium (COLACE) 100 MG capsule Take 1 capsule (100 mg total) by mouth 2 (two) times daily. 60 capsule 0  . hydrochlorothiazide (HYDRODIURIL) 12.5 MG tablet TAKE 1 TABLET PO QD  9  . HYDROcodone-acetaminophen (NORCO/VICODIN) 5-325 MG tablet Take 1-2 tablets by mouth every 6 (six) hours as needed for moderate pain. 10 tablet 0  . losartan (COZAAR) 100 MG tablet TK 1 T PO QD  9  . losartan-hydrochlorothiazide (HYZAAR) 100-12.5 MG tablet losartan 100 mg-hydrochlorothiazide 12.5 mg tablet  TK 1 T PO QD    . meloxicam (MOBIC) 15 MG tablet Take 1 tablet by mouth 1 day or 1 dose.  2  . oxybutynin (DITROPAN) 5 MG tablet Take 1 tablet (5 mg total) by mouth every 8 (eight) hours as needed for bladder spasms. 30 tablet 0  . pantoprazole (PROTONIX) 40 MG tablet Take 40 mg by mouth daily.    . ranitidine (ZANTAC) 150 MG tablet Take 1 tablet (150 mg total) by mouth once for 1 dose. Take 1 tablet 1 hour prior to CTscan (Patient not taking: Reported on 04/16/2018) 1 tablet 0  . tamsulosin (FLOMAX) 0.4 MG CAPS capsule Take 1 capsule (0.4 mg total) by mouth daily. 30 capsule 0  .  tiotropium (SPIRIVA) 18 MCG inhalation capsule Place 18 mcg into inhaler and inhale as needed.      No current facility-administered medications for this visit.       Marland Kitchen  PHYSICAL EXAMINATION: ECOG PERFORMANCE STATUS: 1 - Symptomatic but completely ambulatory  Vitals:   04/16/18 1456  BP: 135/82  Pulse: 81  Resp: 16  Temp: (!) 97.2 F (36.2 C)   Filed Weights   04/16/18 1456  Weight: 217 lb (98.4 kg)    Physical Exam  Constitutional: He is oriented to person, place, and time and well-developed, well-nourished, and in no distress.  HENT:  Head: Normocephalic and atraumatic.  Mouth/Throat: Oropharynx is clear and moist. No oropharyngeal exudate.  Eyes: Pupils are equal, round, and reactive to light.  Neck: Normal range of motion. Neck supple.  Cardiovascular: Normal rate and regular rhythm.  Pulmonary/Chest: No respiratory distress. He has no wheezes.  Abdominal: Soft. Bowel sounds are normal. He exhibits no distension and no mass. There is no tenderness. There is no rebound and no guarding.  Musculoskeletal: Normal range of motion. He exhibits no edema or tenderness.  Neurological: He is alert and oriented to person, place, and time.  Skin: Skin is warm.  Psychiatric: Affect normal.     LABORATORY DATA:  I have reviewed the data as listed Lab Results  Component Value Date   WBC 7.1 04/05/2018   HGB 13.8 04/05/2018   HCT 39.0 (L) 04/05/2018   MCV 88.6 04/05/2018   PLT 202 04/05/2018   Recent Labs    03/09/18 0833 03/30/18 1244 04/05/18 0527  NA  --  139 138  K  --  3.5 4.1  CL  --  104 102  CO2  --  30 30  GLUCOSE  --  81 109*  BUN  --  20 17  CREATININE 1.10 1.12 1.18  CALCIUM  --  9.0 8.3*  GFRNONAA  --  >60 >60  GFRAA  --  >60 >60    RADIOGRAPHIC STUDIES: I have personally reviewed the radiological images as listed and agreed with the findings in the report. Ct  Chest Wo Contrast  Result Date: 04/11/2018 CLINICAL DATA:  Bladder cancer, staging  EXAM: CT CHEST WITHOUT CONTRAST TECHNIQUE: Multidetector CT imaging of the chest was performed following the standard protocol without IV contrast. COMPARISON:  None. FINDINGS: Cardiovascular: Heart is normal in size.  No pericardial effusion. No evidence of thoracic aortic aneurysm. Mild three-vessel coronary atherosclerosis. Mediastinum/Nodes: No suspicious mediastinal lymphadenopathy. Visualized thyroid is unremarkable. Lungs/Pleura: Mild scarring/atelectasis in the bilateral lower lobes. No suspicious pulmonary nodules. Mild centrilobular and paraseptal emphysematous changes, upper lobe predominant. No focal consolidation. No pleural effusion or pneumothorax. Upper Abdomen: Visualized upper abdomen is notable for a right ureteral stent. Musculoskeletal: Degenerative changes of the thoracic spine. IMPRESSION: No evidence of metastatic disease in the chest. Emphysema (ICD10-J43.9). Electronically Signed   By: Julian Hy M.D.   On: 04/11/2018 14:08    ASSESSMENT & PLAN:   Cancer of bladder neck ([facility]) #Transitional cell bladder cancer pT2/ cT4 cN0- [question involving prostate]- stage II/III.    #Recommend neoadjuvant chemotherapy with dose dense MVAC chemotherapy.  Discussed the each individual chemotherapy drugs; discussed goal is cure.  Would recommend 3-4 cycles of chemotherapy; prior to proceeding with definitive cystectomy.  Patient is awaiting evaluation with [facility]rology at [facility] for cystectomy evaluation.  Discussed the potential side effects including but not limited to-increasing fatigue, nausea vomiting, diarrhea, hair loss, sores in the mouth, increase risk of infection and also neuropathy. Also discussed regarding potential side effects of heart failure/leukemia with Adriamycin.  # Growth factor-Neulasta/On pro would be given as prophylaxis for chemotherapy-induced neutropenia to prevent febrile neutropenias.   #Right-sided hydronephrosis-secondary tumor-status post stenting.   Recent creatinine 1.1/GFR greater than 60.   #Recommend port placement 2D echo.  Chemotherapy education.   #Plan labs with chemotherapy/next week MD.   Thank you Dr.Brandon for allowing me to participate in the care of your pleasant patient. Please do not hesitate to contact me with questions or concerns in the interim.  Cc; Dr.Brandon/Dr.Anderson.     All questions were answered. The patient knows to call the clinic with any problems, questions or concerns.       Cammie Sickle, MD 04/16/2018 8:06 PM

## 2018-04-16 NOTE — Progress Notes (Signed)
START ON PATHWAY REGIMEN - Bladder     A cycle is every 14 days:     Methotrexate      Vinblastine      Doxorubicin      Cisplatin      Pegfilgrastim-xxxx   **Always confirm dose/schedule in your pharmacy ordering system**  Patient Characteristics: Pre Cystectomy, Clinical T2-T4a, N0-1, M0, Cystectomy Eligible, Cisplatin-Based Chemotherapy Indicated (CrCl ? 50 mL/min and Minimal or No Symptoms) AJCC M Category: M0 AJCC N Category: N0 AJCC T Category: T4a Current evidence of distant metastases<= No AJCC 8 Stage Grouping: IIIA Intent of Therapy: Curative Intent, Discussed with Patient

## 2018-04-16 NOTE — Patient Instructions (Signed)
Methotrexate injection What is this medicine? METHOTREXATE (METH oh TREX ate) is a chemotherapy drug used to treat cancer including breast cancer, leukemia, and lymphoma. This medicine can also be used to treat psoriasis and certain kinds of arthritis. This medicine may be used for other purposes; ask your health care provider or pharmacist if you have questions. What should I tell my health care provider before I take this medicine? They need to know if you have any of these conditions: -fluid in the stomach area or lungs -if you often drink alcohol -infection or immune system problems -kidney disease -liver disease -low blood counts, like low white cell, platelet, or red cell counts -lung disease -radiation therapy -stomach ulcers -ulcerative colitis -an unusual or allergic reaction to methotrexate, other medicines, foods, dyes, or preservatives -pregnant or trying to get pregnant -breast-feeding How should I use this medicine? This medicine is for infusion into a vein or for injection into muscle or into the spinal fluid (whichever applies). It is usually given by a health care professional in a hospital or clinic setting. In rare cases, you might get this medicine at home. You will be taught how to give this medicine. Use exactly as directed. Take your medicine at regular intervals. Do not take your medicine more often than directed. If this medicine is used for arthritis or psoriasis, it should be taken weekly, NOT daily. It is important that you put your used needles and syringes in a special sharps container. Do not put them in a trash can. If you do not have a sharps container, call your pharmacist or healthcare provider to get one. Talk to your pediatrician regarding the use of this medicine in children. While this drug may be prescribed for children as young as 2 years for selected conditions, precautions do apply. Overdosage: If you think you have taken too much of this medicine  contact a poison control center or emergency room at once. NOTE: This medicine is only for you. Do not share this medicine with others. What if I miss a dose? It is important not to miss your dose. Call your doctor or health care professional if you are unable to keep an appointment. If you give yourself the medicine and you miss a dose, talk with your doctor or health care professional. Do not take double or extra doses. What may interact with this medicine? This medicine may interact with the following medications: -acitretin -aspirin or aspirin-like medicines including salicylates -azathioprine -certain antibiotics like chloramphenicol, penicillin, tetracycline -certain medicines for stomach problems like esomeprazole, omeprazole, pantoprazole -cyclosporine -gold -hydroxychloroquine -live virus vaccines -mercaptopurine -NSAIDs, medicines for pain and inflammation, like ibuprofen or naproxen -other cytotoxic agents -penicillamine -phenylbutazone -phenytoin -probenacid -retinoids such as isotretinoin and tretinoin -steroid medicines like prednisone or cortisone -sulfonamides like sulfasalazine and trimethoprim/sulfamethoxazole -theophylline This list may not describe all possible interactions. Give your health care provider a list of all the medicines, herbs, non-prescription drugs, or dietary supplements you use. Also tell them if you smoke, drink alcohol, or use illegal drugs. Some items may interact with your medicine. What should I watch for while using this medicine? Avoid alcoholic drinks. In some cases, you may be given additional medicines to help with side effects. Follow all directions for their use. This medicine can make you more sensitive to the sun. Keep out of the sun. If you cannot avoid being in the sun, wear protective clothing and use sunscreen. Do not use sun lamps or tanning beds/booths. You may get drowsy   or dizzy. Do not drive, use machinery, or do anything that  needs mental alertness until you know how this medicine affects you. Do not stand or sit up quickly, especially if you are an older patient. This reduces the risk of dizzy or fainting spells. You may need blood work done while you are taking this medicine. Call your doctor or health care professional for advice if you get a fever, chills or sore throat, or other symptoms of a cold or flu. Do not treat yourself. This drug decreases your body's ability to fight infections. Try to avoid being around people who are sick. This medicine may increase your risk to bruise or bleed. Call your doctor or health care professional if you notice any unusual bleeding. Check with your doctor or health care professional if you get an attack of severe diarrhea, nausea and vomiting, or if you sweat a lot. The loss of too much body fluid can make it dangerous for you to take this medicine. Talk to your doctor about your risk of cancer. You may be more at risk for certain types of cancers if you take this medicine. Both men and women must use effective birth control with this medicine. Do not become pregnant while taking this medicine or until at least 1 normal menstrual cycle has occurred after stopping it. Women should inform their doctor if they wish to become pregnant or think they might be pregnant. Men should not father a child while taking this medicine and for 3 months after stopping it. There is a potential for serious side effects to an unborn child. Talk to your health care professional or pharmacist for more information. Do not breast-feed an infant while taking this medicine. What side effects may I notice from receiving this medicine? Side effects that you should report to your doctor or health care professional as soon as possible: -allergic reactions like skin rash, itching or hives, swelling of the face, lips, or tongue -back pain -breathing problems or shortness of breath -confusion -diarrhea -dry,  nonproductive cough -low blood counts - this medicine may decrease the number of Rede blood cells, red blood cells and platelets. You may be at increased risk of infections and bleeding -mouth sores -redness, blistering, peeling or loosening of the skin, including inside the mouth -seizures -severe headaches -signs of infection - fever or chills, cough, sore throat, pain or difficulty passing urine -signs and symptoms of bleeding such as bloody or black, tarry stools; red or dark-brown urine; spitting up blood or brown material that looks like coffee grounds; red spots on the skin; unusual bruising or bleeding from the eye, gums, or nose -signs and symptoms of kidney injury like trouble passing urine or change in the amount of urine -signs and symptoms of liver injury like dark yellow or brown urine; general ill feeling or flu-like symptoms; light-colored stools; loss of appetite; nausea; right upper belly pain; unusually weak or tired; yellowing of the eyes or skin -stiff neck -vomiting Side effects that usually do not require medical attention (report to your doctor or health care professional if they continue or are bothersome): -dizziness -hair loss -headache -stomach pain -upset stomach This list may not describe all possible side effects. Call your doctor for medical advice about side effects. You may report side effects to FDA at 1-800-FDA-1088. Where should I keep my medicine? If you are using this medicine at home, you will be instructed on how to store this medicine. Throw away any unused medicine after   the expiration date on the label. NOTE: This sheet is a summary. It may not cover all possible information. If you have questions about this medicine, talk to your doctor, pharmacist, or health care provider.  2018 [employer]/Gold Standard (2014-10-23 12:36:41) Vinblastine injection What is this medicine? VINBLASTINE (vin BLAS teen) is a chemotherapy drug. It slows the growth of  cancer cells. This medicine is used to treat many types of cancer like breast cancer, testicular cancer, Hodgkin's disease, non-Hodgkin's lymphoma, and sarcoma. This medicine may be used for other purposes; ask your health care provider or pharmacist if you have questions. COMMON BRAND NAME(S): Velban What should I tell my health care provider before I take this medicine? They need to know if you have any of these conditions: -blood disorders -dental disease -gout -infection (especially a virus infection such as chickenpox, cold sores, or herpes) -liver disease -lung disease -nervous system disease -recent or ongoing radiation therapy -an unusual or allergic reaction to vinblastine, other chemotherapy agents, other medicines, foods, dyes, or preservatives -pregnant or trying to get pregnant -breast-feeding How should I use this medicine? This drug is given as an infusion into a vein. It is administered in a hospital or clinic by a specially trained health care professional. If you have pain, swelling, burning or any unusual feeling around the site of your injection, tell your health care professional right away. Talk to your pediatrician regarding the use of this medicine in children. While this drug may be prescribed for selected conditions, precautions do apply. Overdosage: If you think you have taken too much of this medicine contact a poison control center or emergency room at once. NOTE: This medicine is only for you. Do not share this medicine with others. What if I miss a dose? It is important not to miss your dose. Call your doctor or health care professional if you are unable to keep an appointment. What may interact with this medicine? Do not take this medicine with any of the following medications: -erythromycin -itraconazole -mibefradil -voriconazole This medicine may also interact with the following medications: -cyclosporine -fluconazole -ketoconazole -medicines for  seizures like phenytoin -medicines to increase blood counts like filgrastim, pegfilgrastim, sargramostim -vaccines -verapamil Talk to your doctor or health care professional before taking any of these medicines: -acetaminophen -aspirin -ibuprofen -ketoprofen -naproxen This list may not describe all possible interactions. Give your health care provider a list of all the medicines, herbs, non-prescription drugs, or dietary supplements you use. Also tell them if you smoke, drink alcohol, or use illegal drugs. Some items may interact with your medicine. What should I watch for while using this medicine? Your condition will be monitored carefully while you are receiving this medicine. You will need important blood work done while you are taking this medicine. This drug may make you feel generally unwell. This is not uncommon, as chemotherapy can affect healthy cells as well as cancer cells. Report any side effects. Continue your course of treatment even though you feel ill unless your doctor tells you to stop. In some cases, you may be given additional medicines to help with side effects. Follow all directions for their use. Call your doctor or health care professional for advice if you get a fever, chills or sore throat, or other symptoms of a cold or flu. Do not treat yourself. This drug decreases your body's ability to fight infections. Try to avoid being around people who are sick. This medicine may increase your risk to bruise or bleed. Call your doctor  or health care professional if you notice any unusual bleeding. Be careful brushing and flossing your teeth or using a toothpick because you may get an infection or bleed more easily. If you have any dental work done, tell your dentist you are receiving this medicine. Avoid taking products that contain aspirin, acetaminophen, ibuprofen, naproxen, or ketoprofen unless instructed by your doctor. These medicines may hide a fever. Do not become  pregnant while taking this medicine. Women should inform their doctor if they wish to become pregnant or think they might be pregnant. There is a potential for serious side effects to an unborn child. Talk to your health care professional or pharmacist for more information. Do not breast-feed an infant while taking this medicine. Men may have a lower sperm count while taking this medicine. Talk to your doctor if you plan to father a child. What side effects may I notice from receiving this medicine? Side effects that you should report to your doctor or health care professional as soon as possible: -allergic reactions like skin rash, itching or hives, swelling of the face, lips, or tongue -low blood counts - This drug may decrease the number of white blood cells, red blood cells and platelets. You may be at increased risk for infections and bleeding. -signs of infection - fever or chills, cough, sore throat, pain or difficulty passing urine -signs of decreased platelets or bleeding - bruising, pinpoint red spots on the skin, black, tarry stools, nosebleeds -signs of decreased red blood cells - unusually weak or tired, fainting spells, lightheadedness -breathing problems -changes in hearing -change in the amount of urine -chest pain -high blood pressure -mouth sores -nausea and vomiting -pain, swelling, redness or irritation at the injection site -pain, tingling, numbness in the hands or feet -problems with balance, dizziness -seizures Side effects that usually do not require medical attention (report to your doctor or health care professional if they continue or are bothersome): -constipation -hair loss -jaw pain -loss of appetite -sensitivity to light -stomach pain -tumor pain This list may not describe all possible side effects. Call your doctor for medical advice about side effects. You may report side effects to FDA at 1-800-FDA-1088. Where should I keep my medicine? This drug is  given in a hospital or clinic and will not be stored at home. NOTE: This sheet is a summary. It may not cover all possible information. If you have questions about this medicine, talk to your doctor, pharmacist, or health care provider.  2018 [employer]/Gold Standard (2008-03-31 17:15:59) Doxorubicin injection What is this medicine? DOXORUBICIN (dox oh ROO bi sin) is a chemotherapy drug. It is used to treat many kinds of cancer like leukemia, lymphoma, neuroblastoma, sarcoma, and Wilms' tumor. It is also used to treat bladder cancer, breast cancer, lung cancer, ovarian cancer, stomach cancer, and thyroid cancer. This medicine may be used for other purposes; ask your health care provider or pharmacist if you have questions. COMMON BRAND NAME(S): Adriamycin, Adriamycin PFS, Adriamycin RDF, Rubex What should I tell my health care provider before I take this medicine? They need to know if you have any of these conditions: -heart disease -history of low blood counts caused by a medicine -liver disease -recent or ongoing radiation therapy -an unusual or allergic reaction to doxorubicin, other chemotherapy agents, other medicines, foods, dyes, or preservatives -pregnant or trying to get pregnant -breast-feeding How should I use this medicine? This drug is given as an infusion into a vein. It is administered in a hospital or  clinic by a specially trained health care professional. If you have pain, swelling, burning or any unusual feeling around the site of your injection, tell your health care professional right away. Talk to your pediatrician regarding the use of this medicine in children. Special care may be needed. Overdosage: If you think you have taken too much of this medicine contact a poison control center or emergency room at once. NOTE: This medicine is only for you. Do not share this medicine with others. What if I miss a dose? It is important not to miss your dose. Call your doctor or  health care professional if you are unable to keep an appointment. What may interact with this medicine? This medicine may interact with the following medications: -6-mercaptopurine -paclitaxel -phenytoin -St. John's Wort -trastuzumab -verapamil This list may not describe all possible interactions. Give your health care provider a list of all the medicines, herbs, non-prescription drugs, or dietary supplements you use. Also tell them if you smoke, drink alcohol, or use illegal drugs. Some items may interact with your medicine. What should I watch for while using this medicine? This drug may make you feel generally unwell. This is not uncommon, as chemotherapy can affect healthy cells as well as cancer cells. Report any side effects. Continue your course of treatment even though you feel ill unless your doctor tells you to stop. There is a maximum amount of this medicine you should receive throughout your life. The amount depends on the medical condition being treated and your overall health. Your doctor will watch how much of this medicine you receive in your lifetime. Tell your doctor if you have taken this medicine before. You may need blood work done while you are taking this medicine. Your urine may turn red for a few days after your dose. This is not blood. If your urine is dark or brown, call your doctor. In some cases, you may be given additional medicines to help with side effects. Follow all directions for their use. Call your doctor or health care professional for advice if you get a fever, chills or sore throat, or other symptoms of a cold or flu. Do not treat yourself. This drug decreases your body's ability to fight infections. Try to avoid being around people who are sick. This medicine may increase your risk to bruise or bleed. Call your doctor or health care professional if you notice any unusual bleeding. Talk to your doctor about your risk of cancer. You may be more at risk for  certain types of cancers if you take this medicine. Do not become pregnant while taking this medicine or for 6 months after stopping it. Women should inform their doctor if they wish to become pregnant or think they might be pregnant. Men should not father a child while taking this medicine and for 6 months after stopping it. There is a potential for serious side effects to an unborn child. Talk to your health care professional or pharmacist for more information. Do not breast-feed an infant while taking this medicine. This medicine has caused ovarian failure in some women and reduced sperm counts in some men This medicine may interfere with the ability to have a child. Talk with your doctor or health care professional if you are concerned about your fertility. What side effects may I notice from receiving this medicine? Side effects that you should report to your doctor or health care professional as soon as possible: -allergic reactions like skin rash, itching or hives, swelling  of the face, lips, or tongue -breathing problems -chest pain -fast or irregular heartbeat -low blood counts - this medicine may decrease the number of white blood cells, red blood cells and platelets. You may be at increased risk for infections and bleeding. -pain, redness, or irritation at site where injected -signs of infection - fever or chills, cough, sore throat, pain or difficulty passing urine -signs of decreased platelets or bleeding - bruising, pinpoint red spots on the skin, black, tarry stools, blood in the urine -swelling of the ankles, feet, hands -tiredness -weakness Side effects that usually do not require medical attention (report to your doctor or health care professional if they continue or are bothersome): -diarrhea -hair loss -mouth sores -nail discoloration or damage -nausea -red colored urine -vomiting This list may not describe all possible side effects. Call your doctor for medical advice  about side effects. You may report side effects to FDA at 1-800-FDA-1088. Where should I keep my medicine? This drug is given in a hospital or clinic and will not be stored at home. NOTE: This sheet is a summary. It may not cover all possible information. If you have questions about this medicine, talk to your doctor, pharmacist, or health care provider.  2018 [employer]/Gold Standard (2015-08-31 11:28:51) Cisplatin injection What is this medicine? CISPLATIN (SIS pla tin) is a chemotherapy drug. It targets fast dividing cells, like cancer cells, and causes these cells to die. This medicine is used to treat many types of cancer like bladder, ovarian, and testicular cancers. This medicine may be used for other purposes; ask your health care provider or pharmacist if you have questions. COMMON BRAND NAME(S): Platinol, Platinol -AQ What should I tell my health care provider before I take this medicine? They need to know if you have any of these conditions: -blood disorders -hearing problems -kidney disease -recent or ongoing radiation therapy -an unusual or allergic reaction to cisplatin, carboplatin, other chemotherapy, other medicines, foods, dyes, or preservatives -pregnant or trying to get pregnant -breast-feeding How should I use this medicine? This drug is given as an infusion into a vein. It is administered in a hospital or clinic by a specially trained health care professional. Talk to your pediatrician regarding the use of this medicine in children. Special care may be needed. Overdosage: If you think you have taken too much of this medicine contact a poison control center or emergency room at once. NOTE: This medicine is only for you. Do not share this medicine with others. What if I miss a dose? It is important not to miss a dose. Call your doctor or health care professional if you are unable to keep an appointment. What may interact with this  medicine? -dofetilide -foscarnet -medicines for seizures -medicines to increase blood counts like filgrastim, pegfilgrastim, sargramostim -probenecid -pyridoxine used with altretamine -rituximab -some antibiotics like amikacin, gentamicin, neomycin, polymyxin B, streptomycin, tobramycin -sulfinpyrazone -vaccines -zalcitabine Talk to your doctor or health care professional before taking any of these medicines: -acetaminophen -aspirin -ibuprofen -ketoprofen -naproxen This list may not describe all possible interactions. Give your health care provider a list of all the medicines, herbs, non-prescription drugs, or dietary supplements you use. Also tell them if you smoke, drink alcohol, or use illegal drugs. Some items may interact with your medicine. What should I watch for while using this medicine? Your condition will be monitored carefully while you are receiving this medicine. You will need important blood work done while you are taking this medicine. This drug may make  you feel generally unwell. This is not uncommon, as chemotherapy can affect healthy cells as well as cancer cells. Report any side effects. Continue your course of treatment even though you feel ill unless your doctor tells you to stop. In some cases, you may be given additional medicines to help with side effects. Follow all directions for their use. Call your doctor or health care professional for advice if you get a fever, chills or sore throat, or other symptoms of a cold or flu. Do not treat yourself. This drug decreases your body's ability to fight infections. Try to avoid being around people who are sick. This medicine may increase your risk to bruise or bleed. Call your doctor or health care professional if you notice any unusual bleeding. Be careful brushing and flossing your teeth or using a toothpick because you may get an infection or bleed more easily. If you have any dental work done, tell your dentist you are  receiving this medicine. Avoid taking products that contain aspirin, acetaminophen, ibuprofen, naproxen, or ketoprofen unless instructed by your doctor. These medicines may hide a fever. Do not become pregnant while taking this medicine. Women should inform their doctor if they wish to become pregnant or think they might be pregnant. There is a potential for serious side effects to an unborn child. Talk to your health care professional or pharmacist for more information. Do not breast-feed an infant while taking this medicine. Drink fluids as directed while you are taking this medicine. This will help protect your kidneys. Call your doctor or health care professional if you get diarrhea. Do not treat yourself. What side effects may I notice from receiving this medicine? Side effects that you should report to your doctor or health care professional as soon as possible: -allergic reactions like skin rash, itching or hives, swelling of the face, lips, or tongue -signs of infection - fever or chills, cough, sore throat, pain or difficulty passing urine -signs of decreased platelets or bleeding - bruising, pinpoint red spots on the skin, black, tarry stools, nosebleeds -signs of decreased red blood cells - unusually weak or tired, fainting spells, lightheadedness -breathing problems -changes in hearing -gout pain -low blood counts - This drug may decrease the number of white blood cells, red blood cells and platelets. You may be at increased risk for infections and bleeding. -nausea and vomiting -pain, swelling, redness or irritation at the injection site -pain, tingling, numbness in the hands or feet -problems with balance, movement -trouble passing urine or change in the amount of urine Side effects that usually do not require medical attention (report to your doctor or health care professional if they continue or are bothersome): -changes in vision -loss of appetite -metallic taste in the mouth  or changes in taste This list may not describe all possible side effects. Call your doctor for medical advice about side effects. You may report side effects to FDA at 1-800-FDA-1088. Where should I keep my medicine? This drug is given in a hospital or clinic and will not be stored at home. NOTE: This sheet is a summary. It may not cover all possible information. If you have questions about this medicine, talk to your doctor, pharmacist, or health care provider.  2018 Elsevier/Gold Standard (2007-10-09 14:40:54)

## 2018-04-17 ENCOUNTER — Other Ambulatory Visit: Payer: Self-pay | Admitting: *Deleted

## 2018-04-17 ENCOUNTER — Inpatient Hospital Stay: Payer: PPO | Attending: Internal Medicine

## 2018-04-17 ENCOUNTER — Telehealth: Payer: Self-pay | Admitting: *Deleted

## 2018-04-17 ENCOUNTER — Encounter (INDEPENDENT_AMBULATORY_CARE_PROVIDER_SITE_OTHER): Payer: Self-pay

## 2018-04-17 DIAGNOSIS — Z7982 Long term (current) use of aspirin: Secondary | ICD-10-CM | POA: Insufficient documentation

## 2018-04-17 DIAGNOSIS — Z5111 Encounter for antineoplastic chemotherapy: Secondary | ICD-10-CM | POA: Insufficient documentation

## 2018-04-17 DIAGNOSIS — T451X5A Adverse effect of antineoplastic and immunosuppressive drugs, initial encounter: Principal | ICD-10-CM

## 2018-04-17 DIAGNOSIS — Z23 Encounter for immunization: Secondary | ICD-10-CM | POA: Insufficient documentation

## 2018-04-17 DIAGNOSIS — N1330 Unspecified hydronephrosis: Secondary | ICD-10-CM | POA: Insufficient documentation

## 2018-04-17 DIAGNOSIS — J189 Pneumonia, unspecified organism: Secondary | ICD-10-CM | POA: Insufficient documentation

## 2018-04-17 DIAGNOSIS — R112 Nausea with vomiting, unspecified: Secondary | ICD-10-CM

## 2018-04-17 DIAGNOSIS — Z7689 Persons encountering health services in other specified circumstances: Secondary | ICD-10-CM | POA: Insufficient documentation

## 2018-04-17 DIAGNOSIS — I10 Essential (primary) hypertension: Secondary | ICD-10-CM | POA: Insufficient documentation

## 2018-04-17 DIAGNOSIS — C675 Malignant neoplasm of bladder neck: Secondary | ICD-10-CM | POA: Insufficient documentation

## 2018-04-17 DIAGNOSIS — Z87891 Personal history of nicotine dependence: Secondary | ICD-10-CM | POA: Insufficient documentation

## 2018-04-17 DIAGNOSIS — K219 Gastro-esophageal reflux disease without esophagitis: Secondary | ICD-10-CM | POA: Insufficient documentation

## 2018-04-17 DIAGNOSIS — E7800 Pure hypercholesterolemia, unspecified: Secondary | ICD-10-CM | POA: Insufficient documentation

## 2018-04-17 DIAGNOSIS — Z79899 Other long term (current) drug therapy: Secondary | ICD-10-CM | POA: Insufficient documentation

## 2018-04-17 DIAGNOSIS — Z452 Encounter for adjustment and management of vascular access device: Secondary | ICD-10-CM

## 2018-04-17 MED ORDER — LIDOCAINE-PRILOCAINE 2.5-2.5 % EX CREA: 1.0000 "application " | TOPICAL_CREAM | CUTANEOUS | 3 refills | Status: DC | PRN

## 2018-04-17 MED ORDER — PROCHLORPERAZINE MALEATE 10 MG PO TABS: 10.0000 mg | ORAL_TABLET | Freq: Four times a day (QID) | ORAL | 3 refills | Status: DC | PRN

## 2018-04-17 MED ORDER — ONDANSETRON HCL 8 MG PO TABS: 8.0000 mg | ORAL_TABLET | Freq: Three times a day (TID) | ORAL | 3 refills | Status: DC | PRN

## 2018-04-17 NOTE — Telephone Encounter (Signed)
Port placement arranged under the care of Dr. Lucky Cowboy for 04/23/18 with arrival at 130pm. Patient contacted by [facility] RN and instructed to be NPO 6-8 hours prior to this procedure. He was instructed to bring a driver and to [place] [facility] mall. Pt requested emla cream/antiemetics to be sent to his pharmacy before next week. rx for emla cream/zofran/compazine sent per md orders.

## 2018-04-18 ENCOUNTER — Ambulatory Visit
Admission: RE | Admit: 2018-04-18 | Discharge: 2018-04-18 | Disposition: A | Discharge status: Home / Self Care | Payer: PPO | Source: Ambulatory Visit | Attending: Internal Medicine | Admitting: Internal Medicine

## 2018-04-18 DIAGNOSIS — Z79899 Other long term (current) drug therapy: Secondary | ICD-10-CM

## 2018-04-18 DIAGNOSIS — J449 Chronic obstructive pulmonary disease, unspecified: Secondary | ICD-10-CM | POA: Insufficient documentation

## 2018-04-18 DIAGNOSIS — I10 Essential (primary) hypertension: Secondary | ICD-10-CM | POA: Insufficient documentation

## 2018-04-18 DIAGNOSIS — C675 Malignant neoplasm of bladder neck: Secondary | ICD-10-CM | POA: Insufficient documentation

## 2018-04-18 DIAGNOSIS — E7800 Pure hypercholesterolemia, unspecified: Secondary | ICD-10-CM | POA: Diagnosis not present

## 2018-04-18 DIAGNOSIS — Z5181 Encounter for therapeutic drug level monitoring: Secondary | ICD-10-CM | POA: Insufficient documentation

## 2018-04-18 DIAGNOSIS — K219 Gastro-esophageal reflux disease without esophagitis: Secondary | ICD-10-CM | POA: Insufficient documentation

## 2018-04-18 NOTE — Progress Notes (Signed)
*  PRELIMINARY RESULTS* Echocardiogram 2D Echocardiogram has been performed.  [name] 04/18/2018, 11:48 AM

## 2018-04-19 ENCOUNTER — Other Ambulatory Visit (INDEPENDENT_AMBULATORY_CARE_PROVIDER_SITE_OTHER): Payer: Self-pay | Admitting: Nurse Practitioner

## 2018-04-22 MED ORDER — CEFAZOLIN SODIUM-DEXTROSE 2-4 GM/100ML-% IV SOLN
2.0000 g | INTRAVENOUS | Status: AC
  Administered 2018-04-23: 2 g via INTRAVENOUS

## 2018-04-23 ENCOUNTER — Ambulatory Visit
Admission: RE | Admit: 2018-04-23 | Discharge: 2018-04-23 | Disposition: A | Discharge status: Home / Self Care | Payer: PPO | Source: Ambulatory Visit | Attending: Vascular Surgery | Admitting: Vascular Surgery

## 2018-04-23 ENCOUNTER — Encounter
Admission: RE | Disposition: A | Discharge status: Home / Self Care | Payer: Self-pay | Source: Ambulatory Visit | Attending: Vascular Surgery

## 2018-04-23 ENCOUNTER — Encounter: Payer: Self-pay | Admitting: *Deleted

## 2018-04-23 DIAGNOSIS — Z79899 Other long term (current) drug therapy: Secondary | ICD-10-CM | POA: Diagnosis not present

## 2018-04-23 DIAGNOSIS — Z91041 Radiographic dye allergy status: Secondary | ICD-10-CM | POA: Diagnosis not present

## 2018-04-23 DIAGNOSIS — I10 Essential (primary) hypertension: Secondary | ICD-10-CM | POA: Diagnosis not present

## 2018-04-23 DIAGNOSIS — K219 Gastro-esophageal reflux disease without esophagitis: Secondary | ICD-10-CM | POA: Diagnosis not present

## 2018-04-23 DIAGNOSIS — Z960 Presence of urogenital implants: Secondary | ICD-10-CM | POA: Insufficient documentation

## 2018-04-23 DIAGNOSIS — Z87891 Personal history of nicotine dependence: Secondary | ICD-10-CM | POA: Insufficient documentation

## 2018-04-23 DIAGNOSIS — Z8249 Family history of ischemic heart disease and other diseases of the circulatory system: Secondary | ICD-10-CM | POA: Diagnosis not present

## 2018-04-23 DIAGNOSIS — E785 Hyperlipidemia, unspecified: Secondary | ICD-10-CM | POA: Insufficient documentation

## 2018-04-23 DIAGNOSIS — Z9889 Other specified postprocedural states: Secondary | ICD-10-CM | POA: Insufficient documentation

## 2018-04-23 DIAGNOSIS — J449 Chronic obstructive pulmonary disease, unspecified: Secondary | ICD-10-CM | POA: Insufficient documentation

## 2018-04-23 DIAGNOSIS — C675 Malignant neoplasm of bladder neck: Secondary | ICD-10-CM | POA: Diagnosis not present

## 2018-04-23 DIAGNOSIS — C679 Malignant neoplasm of bladder, unspecified: Secondary | ICD-10-CM | POA: Diagnosis not present

## 2018-04-23 HISTORY — PX: PORTA CATH INSERTION: CATH118285

## 2018-04-23 SURGERY — PORTA CATH INSERTION
Anesthesia: Moderate Sedation

## 2018-04-23 MED ORDER — FENTANYL CITRATE (PF) 100 MCG/2ML IJ SOLN
INTRAMUSCULAR | Status: AC
  Filled 2018-04-23: qty 2

## 2018-04-23 MED ORDER — SODIUM CHLORIDE 0.9 % IV SOLN
Freq: Once | INTRAVENOUS | Status: AC
  Administered 2018-04-23: 14:00:00
  Filled 2018-04-23: qty 80

## 2018-04-23 MED ORDER — SODIUM CHLORIDE 0.9 % IV SOLN
INTRAVENOUS | Status: DC
  Administered 2018-04-23: 14:00:00 via INTRAVENOUS

## 2018-04-23 MED ORDER — ONDANSETRON HCL 4 MG/2ML IJ SOLN: 4.0000 mg | Freq: Four times a day (QID) | INTRAMUSCULAR | Status: DC | PRN

## 2018-04-23 MED ORDER — MIDAZOLAM HCL 5 MG/5ML IJ SOLN
INTRAMUSCULAR | Status: AC
  Filled 2018-04-23: qty 5

## 2018-04-23 MED ORDER — HEPARIN (PORCINE) IN NACL 1000-0.9 UT/500ML-% IV SOLN
INTRAVENOUS | Status: AC
  Filled 2018-04-23: qty 500

## 2018-04-23 MED ORDER — LIDOCAINE-EPINEPHRINE (PF) 1 %-1:200000 IJ SOLN
INTRAMUSCULAR | Status: AC
  Filled 2018-04-23: qty 30

## 2018-04-23 MED ORDER — CEFAZOLIN SODIUM-DEXTROSE 2-4 GM/100ML-% IV SOLN
INTRAVENOUS | Status: AC
  Administered 2018-04-23: 2 g via INTRAVENOUS
  Filled 2018-04-23: qty 100

## 2018-04-23 MED ORDER — FENTANYL CITRATE (PF) 100 MCG/2ML IJ SOLN
INTRAMUSCULAR | Status: DC | PRN
  Administered 2018-04-23 (×2): 50 ug via INTRAVENOUS

## 2018-04-23 MED ORDER — HYDROMORPHONE HCL 1 MG/ML IJ SOLN: 1.0000 mg | Freq: Once | INTRAMUSCULAR | Status: DC | PRN

## 2018-04-23 MED ORDER — MIDAZOLAM HCL 2 MG/2ML IJ SOLN
INTRAMUSCULAR | Status: DC | PRN
  Administered 2018-04-23 (×2): 2 mg via INTRAVENOUS

## 2018-04-23 SURGICAL SUPPLY — 11 items
DERMABOND ADVANCED (GAUZE/BANDAGES/DRESSINGS) ×1
DERMABOND ADVANCED .7 DNX12 (GAUZE/BANDAGES/DRESSINGS) ×1 IMPLANT
KIT PORT POWER 8FR ISP CVUE (Port) ×2 IMPLANT
PACK ANGIOGRAPHY (CUSTOM PROCEDURE TRAY) ×4 IMPLANT
PAD GROUND ADULT SPLIT (MISCELLANEOUS) ×2 IMPLANT
PENCIL ELECTRO HAND CTR (MISCELLANEOUS) ×2 IMPLANT
SPONGE XRAY 4X4 16PLY STRL (MISCELLANEOUS) ×2 IMPLANT
SUT MNCRL AB 4-0 PS2 18 (SUTURE) ×2 IMPLANT
SUT PROLENE 0 CT 1 30 (SUTURE) ×2 IMPLANT
SUT VICRYL+ 3-0 36IN CT-1 (SUTURE) ×2 IMPLANT
TOWEL OR 17X26 4PK STRL BLUE (TOWEL DISPOSABLE) ×2 IMPLANT

## 2018-04-23 NOTE — H&P (Signed)
[facility] History & Physical Update  The patient was interviewed and re-examined.  The patient's previous History and Physical has been reviewed and is unchanged.  There is no change in the plan of care. We plan to proceed with the scheduled procedure.  Leotis Pain, MD  04/23/2018, 1:15 PM

## 2018-04-23 NOTE — Op Note (Signed)
      [facility]       Operative Note  Date: 04/23/2018  Preoperative diagnosis:  1. Bladder cancer  Postoperative diagnosis:  Same as above  Procedures: #1. Ultrasound guidance for vascular access to the right internal jugular vein. #2. Fluoroscopic guidance for placement of catheter. #3. Placement of CT compatible Port-A-Cath, right internal jugular vein.  Surgeon: Leotis Pain, MD.   Anesthesia: Local with moderate conscious sedation for approximately 20  minutes using 4 mg of Versed and 100 mcg of Fentanyl  Fluoroscopy time: less than 1 minute  Contrast used: 0  Estimated blood loss: 3 cc  Indication for the procedure:  The patient is a 67 y.o.male with bladder cancer.  The patient needs a Port-A-Cath for durable venous access, chemotherapy, lab draws, and CT scans. We are asked to place this. Risks and benefits were discussed and informed consent was obtained.  Description of procedure: The patient was brought to the vascular and interventional radiology suite.  Moderate conscious sedation was administered throughout the procedure during a face to face encounter with the patient with my supervision of the RN administering medicines and monitoring the patient's vital signs, pulse oximetry, telemetry and mental status throughout from the start of the procedure until the patient was taken to the recovery room. The right neck chest and shoulder were sterilely prepped and draped, and a sterile surgical field was created. Ultrasound was used to help visualize a patent right internal jugular vein. This was then accessed under direct ultrasound guidance without difficulty with the Seldinger needle and a permanent image was recorded. A J-wire was placed. After skin nick and dilatation, the peel-away sheath was then placed over the wire. I then anesthetized an area under the clavicle approximately 1-2 fingerbreadths. A transverse incision was created and an inferior pocket  was created with electrocautery and blunt dissection. The port was then brought onto the field, placed into the pocket and secured to the chest wall with 2 Prolene sutures. The catheter was connected to the port and tunneled from the subclavicular incision to the access site. Fluoroscopic guidance was then used to cut the catheter to an appropriate length. The catheter was then placed through the peel-away sheath and the peel-away sheath was removed. The catheter tip was parked in excellent location under fluorocoscopic guidance in the SVC just above the right atrium. The pocket was then irrigated with antibiotic impregnated saline and the wound was closed with a running 3-0 Vicryl and a 4-0 Monocryl. The access incision was closed with a single 4-0 Monocryl. The Huber needle was used to withdraw blood and flush the port with heparinized saline. Dermabond was then placed as a dressing. The patient tolerated the procedure well and was taken to the recovery room in stable condition.   Leotis Pain 04/23/2018 2:25 PM   This note was created with Dragon Medical transcription system. Any errors in dictation are purely unintentional.

## 2018-04-24 ENCOUNTER — Encounter: Payer: Self-pay | Admitting: Vascular Surgery

## 2018-04-25 ENCOUNTER — Inpatient Hospital Stay (HOSPITAL_BASED_OUTPATIENT_CLINIC_OR_DEPARTMENT_OTHER): Payer: PPO | Admitting: Internal Medicine

## 2018-04-25 ENCOUNTER — Inpatient Hospital Stay: Payer: PPO

## 2018-04-25 ENCOUNTER — Encounter: Payer: Self-pay | Admitting: Internal Medicine

## 2018-04-25 VITALS — BP 131/84 | HR 91 | Temp 97.3°F | Resp 16 | Wt 214.0 lb

## 2018-04-25 DIAGNOSIS — N1330 Unspecified hydronephrosis: Secondary | ICD-10-CM | POA: Diagnosis not present

## 2018-04-25 DIAGNOSIS — Z95828 Presence of other vascular implants and grafts: Secondary | ICD-10-CM

## 2018-04-25 DIAGNOSIS — Z7982 Long term (current) use of aspirin: Secondary | ICD-10-CM | POA: Diagnosis not present

## 2018-04-25 DIAGNOSIS — Z87891 Personal history of nicotine dependence: Secondary | ICD-10-CM | POA: Diagnosis not present

## 2018-04-25 DIAGNOSIS — Z23 Encounter for immunization: Secondary | ICD-10-CM

## 2018-04-25 DIAGNOSIS — C675 Malignant neoplasm of bladder neck: Secondary | ICD-10-CM

## 2018-04-25 DIAGNOSIS — Z79899 Other long term (current) drug therapy: Secondary | ICD-10-CM | POA: Diagnosis not present

## 2018-04-25 DIAGNOSIS — J189 Pneumonia, unspecified organism: Secondary | ICD-10-CM | POA: Diagnosis not present

## 2018-04-25 DIAGNOSIS — E7800 Pure hypercholesterolemia, unspecified: Secondary | ICD-10-CM | POA: Diagnosis not present

## 2018-04-25 DIAGNOSIS — I10 Essential (primary) hypertension: Secondary | ICD-10-CM | POA: Diagnosis not present

## 2018-04-25 DIAGNOSIS — Z5111 Encounter for antineoplastic chemotherapy: Secondary | ICD-10-CM | POA: Diagnosis not present

## 2018-04-25 DIAGNOSIS — K219 Gastro-esophageal reflux disease without esophagitis: Secondary | ICD-10-CM | POA: Diagnosis not present

## 2018-04-25 DIAGNOSIS — Z7689 Persons encountering health services in other specified circumstances: Secondary | ICD-10-CM | POA: Diagnosis not present

## 2018-04-25 LAB — CBC WITH DIFFERENTIAL/PLATELET
Abs Immature Granulocytes: 0.03 10*3/uL (ref 0.00–0.07)
BASOS ABS: 0 10*3/uL (ref 0.0–0.1)
BASOS PCT: 1 %
EOS PCT: 5 %
Eosinophils Absolute: 0.2 10*3/uL (ref 0.0–0.5)
HCT: 44.1 % (ref 39.0–52.0)
HEMOGLOBIN: 14.6 g/dL (ref 13.0–17.0)
Immature Granulocytes: 1 %
Lymphocytes Relative: 14 %
Lymphs Abs: 0.5 10*3/uL — ABNORMAL LOW (ref 0.7–4.0)
MCH: 29.3 pg (ref 26.0–34.0)
MCHC: 33.1 g/dL (ref 30.0–36.0)
MCV: 88.6 fL (ref 80.0–100.0)
MONO ABS: 0.4 10*3/uL (ref 0.1–1.0)
Monocytes Relative: 12 %
NRBC: 0 % (ref 0.0–0.2)
Neutro Abs: 2.5 10*3/uL (ref 1.7–7.7)
Neutrophils Relative %: 67 %
Platelets: 197 10*3/uL (ref 150–400)
RBC: 4.98 MIL/uL (ref 4.22–5.81)
RDW: 12.6 % (ref 11.5–15.5)
WBC: 3.7 10*3/uL — AB (ref 4.0–10.5)

## 2018-04-25 LAB — COMPREHENSIVE METABOLIC PANEL
ALK PHOS: 68 U/L (ref 38–126)
ALT: 22 U/L (ref 0–44)
AST: 24 U/L (ref 15–41)
Albumin: 4 g/dL (ref 3.5–5.0)
Anion gap: 9 (ref 5–15)
BUN: 24 mg/dL — AB (ref 8–23)
CALCIUM: 9.3 mg/dL (ref 8.9–10.3)
CHLORIDE: 102 mmol/L (ref 98–111)
CO2: 28 mmol/L (ref 22–32)
CREATININE: 1.1 mg/dL (ref 0.61–1.24)
Glucose, Bld: 125 mg/dL — ABNORMAL HIGH (ref 70–99)
Potassium: 4 mmol/L (ref 3.5–5.1)
Sodium: 139 mmol/L (ref 135–145)
TOTAL PROTEIN: 6.9 g/dL (ref 6.5–8.1)
Total Bilirubin: 0.9 mg/dL (ref 0.3–1.2)

## 2018-04-25 MED ORDER — SODIUM CHLORIDE 0.9% FLUSH
10.0000 mL | INTRAVENOUS | Status: DC | PRN
  Administered 2018-04-25: 10 mL via INTRAVENOUS
  Filled 2018-04-25: qty 10

## 2018-04-25 MED ORDER — INFLUENZA VAC SPLIT HIGH-DOSE 0.5 ML IM SUSY
0.5000 mL | PREFILLED_SYRINGE | Freq: Once | INTRAMUSCULAR | Status: AC
  Administered 2018-04-25: 0.5 mL via INTRAMUSCULAR
  Filled 2018-04-25: qty 0.5

## 2018-04-25 MED ORDER — DEXAMETHASONE SODIUM PHOSPHATE 10 MG/ML IJ SOLN
10.0000 mg | Freq: Once | INTRAMUSCULAR | Status: AC
  Administered 2018-04-25: 10 mg via INTRAVENOUS
  Filled 2018-04-25: qty 1

## 2018-04-25 MED ORDER — SODIUM CHLORIDE 0.9 % IV SOLN
Freq: Once | INTRAVENOUS | Status: AC
  Administered 2018-04-25: 11:00:00 via INTRAVENOUS
  Filled 2018-04-25: qty 250

## 2018-04-25 MED ORDER — SODIUM CHLORIDE 0.9 % IV SOLN
30.0000 mg/m2 | Freq: Once | INTRAVENOUS | Status: AC
  Administered 2018-04-25: 64.75 mg via INTRAVENOUS
  Filled 2018-04-25: qty 2.59

## 2018-04-25 MED ORDER — HEPARIN SOD (PORK) LOCK FLUSH 100 UNIT/ML IV SOLN
500.0000 [IU] | Freq: Once | INTRAVENOUS | Status: AC
  Administered 2018-04-25: 500 [IU] via INTRAVENOUS
  Filled 2018-04-25: qty 5

## 2018-04-25 MED ORDER — INFLUENZA VAC SPLIT HIGH-DOSE 0.5 ML IM SUSY: 0.5000 mL | PREFILLED_SYRINGE | INTRAMUSCULAR | Status: DC

## 2018-04-25 MED ORDER — ONDANSETRON HCL 4 MG/2ML IJ SOLN
8.0000 mg | Freq: Once | INTRAMUSCULAR | Status: AC
  Administered 2018-04-25: 8 mg via INTRAVENOUS
  Filled 2018-04-25: qty 4

## 2018-04-25 NOTE — Progress Notes (Signed)
[place] NOTE  Patient Care Team: Kirk Ruths, MD as PCP - General (Internal Medicine)  CHIEF COMPLAINTS/PURPOSE OF CONSULTATION:  Bladder cancer  #  Oncology History   # BLADDER CANCER: pT2/cpT4  prostate/bladder neck Dr.Brandon TURP s/p  - INVASIVE UROTHELIAL CARCINOMA, HIGH-GRADE;  TUMOR INVADES BLADDER NECK MUSCULARIS PROPRIA. Mild-mod Right hydronephrosis [HIGH RISK FEATURES]    # OCt 9t [place]  # 2 Echo- 50% to 55%. [oct 2019]   # COPD/ not on O2; [quit 2009];    DIAGNOSIS: BLADDER CA  STAGE:  PT2/cT4 cN0    ;GOALS: cure  CURRENT/MOST RECENT THERAPY : MVAC dd      Cancer of bladder neck ([place])   04/24/2018 -  Chemotherapy    The patient had DOXOrubicin (ADRIAMYCIN) chemo injection 64 mg, 30 mg/m2 = 64 mg, Intravenous,  Once, 1 of 4 cycles palonosetron (ALOXI) injection 0.25 mg, 0.25 mg, Intravenous,  Once, 1 of 4 cycles pegfilgrastim (NEULASTA ONPRO KIT) injection 6 mg, 6 mg, Subcutaneous, Once, 1 of 4 cycles methotrexate (PF) 64.75 mg in sodium chloride 0.9 % 50 mL chemo infusion, 30 mg/m2 = 64.75 mg, Intravenous,  Once, 1 of 4 cycles CISplatin (PLATINOL) 151 mg in sodium chloride 0.9 % 500 mL chemo infusion, 70 mg/m2 = 151 mg, Intravenous,  Once, 1 of 4 cycles vinBLAStine (VELBAN) 6.5 mg in sodium chloride 0.9 % 50 mL chemo infusion, 3 mg/m2 = 6.5 mg, Intravenous, Once, 1 of 4 cycles fosaprepitant (EMEND) 150 mg, dexamethasone (DECADRON) 12 mg in sodium chloride 0.9 % 145 mL IVPB, , Intravenous,  Once, 1 of 4 cycles  for chemotherapy treatment.       HISTORY OF PRESENTING ILLNESS:  Steve Gilbert 67 y.o.  male with newly diagnosed muscle invasive bladder cancer currently on neoadjuvant chemotherapy/is here to proceed with cycle #1 of chemotherapy.  In the interim patient had a port placed.  Also had a 2D echo done.   Patient continues to have intermittent hematuria.  No gross blood in urine. Denies any Tingling or numbness.   No nausea no vomiting.  Review of Systems  Constitutional: Negative for chills, diaphoresis, fever, malaise/fatigue and weight loss.  HENT: Negative for nosebleeds and sore throat.   Eyes: Negative for double vision.  Respiratory: Negative for cough, hemoptysis, sputum production, shortness of breath and wheezing.   Cardiovascular: Negative for chest pain, palpitations, orthopnea and leg swelling.  Gastrointestinal: Negative for abdominal pain, blood in stool, constipation, diarrhea, heartburn, melena, nausea and vomiting.  Genitourinary: Positive for hematuria. Negative for dysuria, frequency and urgency.  Musculoskeletal: Negative for back pain and joint pain.  Skin: Negative.  Negative for itching and rash.  Neurological: Negative for dizziness, tingling, focal weakness, weakness and headaches.  Endo/Heme/Allergies: Does not bruise/bleed easily.  Psychiatric/Behavioral: Negative for depression. The patient is not nervous/anxious and does not have insomnia.      MEDICAL HISTORY:  Past Medical History:  Diagnosis Date  . Acid reflux   . Allergy   . Cancer ([place])   . COPD (chronic obstructive pulmonary disease) ([place])   . High cholesterol   . Hyperlipidemia 04/03/2018  . Hypertension   . Pneumonia     SURGICAL HISTORY: Past Surgical History:  Procedure Laterality Date  . CYSTOSCOPY WITH BIOPSY N/A 04/04/2018   Procedure: CYSTOSCOPY WITH TURBT;  Surgeon: Hollice Espy, MD;  Location: [facility] ORS;  Service: Urology;  Laterality: N/A;  . CYSTOSCOPY WITH URETEROSCOPY AND STENT PLACEMENT Right 04/04/2018   Procedure:  CYSTOSCOPY WITH URETEROSCOPY AND STENT PLACEMENT;  Surgeon: Hollice Espy, MD;  Location: [facility] ORS;  Service: Urology;  Laterality: Right;  . NASAL SINUS SURGERY    . PORTA CATH INSERTION N/A 04/23/2018   Procedure: PORTA CATH INSERTION;  Surgeon: Algernon Huxley, MD;  Location: [place] [facility];  Service: Cardiovascular;  Laterality: N/A;  . TRANSURETHRAL RESECTION OF  PROSTATE N/A 04/04/2018   Procedure: TRANSURETHRAL RESECTION OF THE PROSTATE (TURP);  Surgeon: Hollice Espy, MD;  Location: [facility] ORS;  Service: Urology;  Laterality: N/A;  . URETERAL BIOPSY Right 04/04/2018   Procedure: URETERAL BIOPSY;  Surgeon: Hollice Espy, MD;  Location: [facility] ORS;  Service: Urology;  Laterality: Right;    SOCIAL HISTORY: pt lives with his wife; currently retired.  Works part-time.  Remote history of smoking.  No alcohol. Social History   Socioeconomic History  . Marital status: Married    Spouse name: Not on file  . Number of children: Not on file  . Years of education: Not on file  . Highest education level: Not on file  Occupational History  . Not on file  Social Needs  . Financial resource strain: Not on file  . Food insecurity:    Worry: Not on file    Inability: Not on file  . Transportation needs:    Medical: Not on file    Non-medical: Not on file  Tobacco Use  . Smoking status: Former Smoker    Packs/day: 1.50    Years: 30.00    Pack years: 45.00    Types: Cigarettes    Last attempt to quit: 2009    Years since quitting: 10.7  . Smokeless tobacco: Former Network engineer and Sexual Activity  . Alcohol use: Yes    Alcohol/week: 7.0 standard drinks    Types: 5 Cans of beer, 2 Standard drinks or equivalent per week    Comment: ocassional   . Drug use: Not Currently  . Sexual activity: Not on file  Lifestyle  . Physical activity:    Days per week: Not on file    Minutes per session: Not on file  . Stress: Not on file  Relationships  . Social connections:    Talks on phone: Not on file    Gets together: Not on file    Attends religious service: Not on file    Active member of club or organization: Not on file    Attends meetings of clubs or organizations: Not on file    Relationship status: Not on file  . Intimate partner violence:    Fear of current or ex partner: Not on file    Emotionally abused: Not on file    Physically abused:  Not on file    Forced sexual activity: Not on file  Other Topics Concern  . Not on file  Social History Narrative  . Not on file    FAMILY HISTORY: Family History  Problem Relation Age of Onset  . Diabetes Mother   . Heart disease Father   . Diabetes Brother   . Heart attack Brother   . Heart disease Brother     ALLERGIES:  is allergic to contrast media [iodinated diagnostic agents].  MEDICATIONS:  Current Outpatient Medications  Medication Sig Dispense Refill  . amLODipine (NORVASC) 10 MG tablet Take 10 mg by mouth daily.    Marland Kitchen aspirin EC 81 MG tablet Take 81 mg by mouth daily.    Marland Kitchen atorvastatin (LIPITOR) 20 MG tablet Take 20  mg by mouth daily.    Marland Kitchen azelastine (ASTELIN) 0.1 % nasal spray Place into both nostrils 2 (two) times daily. Use in each nostril as directed    . diphenhydrAMINE (BENADRYL) 50 MG tablet Take 1 tablet (50 mg total) by mouth once for 1 dose. 1 tablet 1 hour prior to CTscan 1 tablet 0  . docusate sodium (COLACE) 100 MG capsule Take 1 capsule (100 mg total) by mouth 2 (two) times daily. 60 capsule 0  . Fluticasone-Salmeterol (ADVAIR) 250-50 MCG/DOSE AEPB Inhale 1 puff into the lungs 2 (two) times daily.    . hydrochlorothiazide (HYDRODIURIL) 12.5 MG tablet TAKE 1 TABLET PO QD  9  . HYDROcodone-acetaminophen (NORCO/VICODIN) 5-325 MG tablet Take 1-2 tablets by mouth every 6 (six) hours as needed for moderate pain. 10 tablet 0  . lidocaine-prilocaine (EMLA) cream Apply 1 application topically as needed. 30 g 3  . losartan (COZAAR) 100 MG tablet TK 1 T PO QD  9  . losartan-hydrochlorothiazide (HYZAAR) 100-12.5 MG tablet losartan 100 mg-hydrochlorothiazide 12.5 mg tablet  TK 1 T PO QD    . meloxicam (MOBIC) 15 MG tablet Take 1 tablet by mouth 1 day or 1 dose.  2  . Multiple Vitamins-Minerals (MULTIVITAMIN WITH MINERALS) tablet Take 1 tablet by mouth daily.    . ondansetron (ZOFRAN) 8 MG tablet Take 1 tablet (8 mg total) by mouth every 8 (eight) hours as needed for  nausea or vomiting. 20 tablet 3  . oxybutynin (DITROPAN) 5 MG tablet Take 1 tablet (5 mg total) by mouth every 8 (eight) hours as needed for bladder spasms. 30 tablet 0  . pantoprazole (PROTONIX) 40 MG tablet Take 40 mg by mouth daily.    . prochlorperazine (COMPAZINE) 10 MG tablet Take 1 tablet (10 mg total) by mouth every 6 (six) hours as needed for nausea or vomiting. 30 tablet 3  . Saw Palmetto 450 MG CAPS Take 450 mg by mouth daily.    . tamsulosin (FLOMAX) 0.4 MG CAPS capsule Take 1 capsule (0.4 mg total) by mouth daily. 30 capsule 0  . tiotropium (SPIRIVA) 18 MCG inhalation capsule Place 18 mcg into inhaler and inhale daily.     . ranitidine (ZANTAC) 150 MG tablet Take 1 tablet (150 mg total) by mouth once for 1 dose. Take 1 tablet 1 hour prior to CTscan (Patient not taking: Reported on 04/16/2018) 1 tablet 0   No current facility-administered medications for this visit.    Facility-Administered Medications Ordered in Other Visits  Medication Dose Route Frequency Provider Last Rate Last Dose  . 0.9 %  sodium chloride infusion   Intravenous Once Charlaine Dalton R, MD      . dexamethasone (DECADRON) injection 10 mg  10 mg Intravenous Once Charlaine Dalton R, MD      . heparin lock flush 100 unit/mL  500 Units Intravenous Once Cammie Sickle, MD      . Derrill Memo ON 04/26/2018] Influenza vac split quadrivalent PF (FLUZONE HIGH-DOSE) injection 0.5 mL  0.5 mL Intramuscular Tomorrow-1000 Charlaine Dalton R, MD      . methotrexate (PF) 64.75 mg in sodium chloride 0.9 % 50 mL chemo infusion  30 mg/m2 (Treatment Plan Recorded) Intravenous Once Cammie Sickle, MD      . ondansetron [facility]) injection 8 mg  8 mg Intravenous Once Charlaine Dalton R, MD      . sodium chloride flush (NS) 0.9 % injection 10 mL  10 mL Intravenous PRN Cammie Sickle, MD  10 mL at 04/25/18 0916      .  PHYSICAL EXAMINATION: ECOG PERFORMANCE STATUS: 1 - Symptomatic but completely  ambulatory  Vitals:   04/25/18 0932 04/25/18 0941  BP:  131/84  Pulse:  91  Resp: 16 16  Temp:  (!) 97.3 F (36.3 C)   Filed Weights   04/25/18 0941  Weight: 214 lb (97.1 kg)    Physical Exam  Constitutional: He is oriented to person, place, and time and well-developed, well-nourished, and in no distress.  Is accompanied by sister-in-law.  HENT:  Head: Normocephalic and atraumatic.  Mouth/Throat: Oropharynx is clear and moist. No oropharyngeal exudate.  Eyes: Pupils are equal, round, and reactive to light.  Neck: Normal range of motion. Neck supple.  Cardiovascular: Normal rate and regular rhythm.  Pulmonary/Chest: No respiratory distress. He has no wheezes.  Abdominal: Soft. Bowel sounds are normal. He exhibits no distension and no mass. There is no tenderness. There is no rebound and no guarding.  Musculoskeletal: Normal range of motion. He exhibits no edema or tenderness.  Neurological: He is alert and oriented to person, place, and time.  Skin: Skin is warm.  Psychiatric: Affect normal.     LABORATORY DATA:  I have reviewed the data as listed Lab Results  Component Value Date   WBC 3.7 (L) 04/25/2018   HGB 14.6 04/25/2018   HCT 44.1 04/25/2018   MCV 88.6 04/25/2018   PLT 197 04/25/2018   Recent Labs    03/30/18 1244 04/05/18 0527 04/25/18 0919  NA 139 138 139  K 3.5 4.1 4.0  CL 104 102 102  CO2 _0 GLUCOSE 81 109* 125*  BUN 20 17 24*  CREATININE 1.12 1.18 1.10  CALCIUM 9.0 8.3* 9.3  GFRNONAA >60 >60 >60  GFRAA >60 >60 >60  PROT  --   --  6.9  ALBUMIN  --   --  4.0  AST  --   --  24  ALT  --   --  22  ALKPHOS  --   --  68  BILITOT  --   --  0.9    RADIOGRAPHIC STUDIES: I have personally reviewed the radiological images as listed and agreed with the findings in the report. Ct Chest Wo Contrast  Result Date: 04/11/2018 CLINICAL DATA:  Bladder cancer, staging EXAM: CT CHEST WITHOUT CONTRAST TECHNIQUE: Multidetector CT imaging of the chest  was performed following the standard protocol without IV contrast. COMPARISON:  None. FINDINGS: Cardiovascular: Heart is normal in size.  No pericardial effusion. No evidence of thoracic aortic aneurysm. Mild three-vessel coronary atherosclerosis. Mediastinum/Nodes: No suspicious mediastinal lymphadenopathy. Visualized thyroid is unremarkable. Lungs/Pleura: Mild scarring/atelectasis in the bilateral lower lobes. No suspicious pulmonary nodules. Mild centrilobular and paraseptal emphysematous changes, upper lobe predominant. No focal consolidation. No pleural effusion or pneumothorax. Upper Abdomen: Visualized upper abdomen is notable for a right ureteral stent. Musculoskeletal: Degenerative changes of the thoracic spine. IMPRESSION: No evidence of metastatic disease in the chest. Emphysema (ICD10-J43.9). Electronically Signed   By: Julian Hy M.D.   On: 04/11/2018 14:08    ASSESSMENT & PLAN:   Cancer of bladder neck ([facility]) #Transitional cell bladder cancer pT2/ cT4 cN0- [question involving prostate]- stage II/III.    # proceed with neoadjuvant chemotherapy with dose dense MVAC chemotherapy cycle #1.  Labs today reviewed;  acceptable for treatment today.  Again reviewed the potential side effects of chemotherapy at length.  # Growth factor-Neulasta/On pro would be given as prophylaxis for  chemotherapy-induced neutropenia to prevent febrile neutropenias. Pt on claritin.   # Right-sided hydronephrosis-secondary tumor-status post stenting.  Recent creatinine 1.1/GFR> 60; monitor closely. STABLE.   # Flu shot- today.   # DISPOSITION: # treatment today; tomorrow # 1 week-labs [cbc/bmp]; possible fluids 1 hour # follow up in 2 week/MD- labs- cbc/cmp; day-1 metho; d-2 VAC; onpro- Dr.B    All questions were answered. The patient knows to call the clinic with any problems, questions or concerns.       Cammie Sickle, MD 04/25/2018 10:40 AM

## 2018-04-25 NOTE — Assessment & Plan Note (Addendum)
#  Transitional cell bladder cancer pT2/ cT4 cN0- [question involving prostate]- stage II/III.    # proceed with neoadjuvant chemotherapy with dose dense MVAC chemotherapy cycle #1.  Labs today reviewed;  acceptable for treatment today.  Again reviewed the potential side effects of chemotherapy at length.  # Growth factor-Neulasta/On pro would be given as prophylaxis for chemotherapy-induced neutropenia to prevent febrile neutropenias. Pt on claritin.   # Right-sided hydronephrosis-secondary tumor-status post stenting.  Recent creatinine 1.1/GFR> 60; monitor closely. STABLE.   # Flu shot- today.   # DISPOSITION: # treatment today; tomorrow # 1 week-labs [cbc/bmp]; possible fluids 1 hour # follow up in 2 week/MD- labs- cbc/cmp; day-1 metho; d-2 VAC; onpro- Dr.B

## 2018-04-26 ENCOUNTER — Inpatient Hospital Stay: Payer: PPO | Admitting: Nurse Practitioner

## 2018-04-26 ENCOUNTER — Inpatient Hospital Stay: Payer: PPO

## 2018-04-26 VITALS — BP 132/78 | HR 87 | Temp 97.9°F | Resp 18 | Wt 214.0 lb

## 2018-04-26 DIAGNOSIS — Z5111 Encounter for antineoplastic chemotherapy: Secondary | ICD-10-CM | POA: Diagnosis not present

## 2018-04-26 DIAGNOSIS — C675 Malignant neoplasm of bladder neck: Secondary | ICD-10-CM

## 2018-04-26 MED ORDER — DOXORUBICIN HCL CHEMO IV INJECTION 2 MG/ML
30.0000 mg/m2 | Freq: Once | INTRAVENOUS | Status: AC
  Administered 2018-04-26: 64 mg via INTRAVENOUS
  Filled 2018-04-26: qty 32

## 2018-04-26 MED ORDER — SODIUM CHLORIDE 0.9 % IV SOLN
Freq: Once | INTRAVENOUS | Status: AC
  Administered 2018-04-26: 09:00:00 via INTRAVENOUS
  Filled 2018-04-26: qty 250

## 2018-04-26 MED ORDER — SODIUM CHLORIDE 0.9 % IV SOLN
70.0000 mg/m2 | Freq: Once | INTRAVENOUS | Status: AC
  Administered 2018-04-26: 151 mg via INTRAVENOUS
  Filled 2018-04-26: qty 151

## 2018-04-26 MED ORDER — POTASSIUM CHLORIDE 2 MEQ/ML IV SOLN
Freq: Once | INTRAVENOUS | Status: AC
  Administered 2018-04-26: 09:00:00 via INTRAVENOUS
  Filled 2018-04-26: qty 1000

## 2018-04-26 MED ORDER — PALONOSETRON HCL INJECTION 0.25 MG/5ML
0.2500 mg | Freq: Once | INTRAVENOUS | Status: AC
  Administered 2018-04-26: 0.25 mg via INTRAVENOUS
  Filled 2018-04-26: qty 5

## 2018-04-26 MED ORDER — PEGFILGRASTIM 6 MG/0.6ML ~~LOC~~ PSKT
6.0000 mg | PREFILLED_SYRINGE | Freq: Once | SUBCUTANEOUS | Status: AC
  Administered 2018-04-26: 6 mg via SUBCUTANEOUS
  Filled 2018-04-26: qty 0.6

## 2018-04-26 MED ORDER — HEPARIN SOD (PORK) LOCK FLUSH 100 UNIT/ML IV SOLN
500.0000 [IU] | Freq: Once | INTRAVENOUS | Status: AC | PRN
  Administered 2018-04-26: 500 [IU]
  Filled 2018-04-26: qty 5

## 2018-04-26 MED ORDER — VINBLASTINE SULFATE CHEMO INJECTION 1 MG/ML
3.0000 mg/m2 | Freq: Once | INTRAVENOUS | Status: AC
  Administered 2018-04-26: 6.5 mg via INTRAVENOUS
  Filled 2018-04-26: qty 6.5

## 2018-04-26 MED ORDER — SODIUM CHLORIDE 0.9 % IV SOLN
Freq: Once | INTRAVENOUS | Status: AC
  Administered 2018-04-26: 11:00:00 via INTRAVENOUS
  Filled 2018-04-26: qty 5

## 2018-04-27 ENCOUNTER — Inpatient Hospital Stay: Payer: PPO

## 2018-04-27 DIAGNOSIS — C675 Malignant neoplasm of bladder neck: Secondary | ICD-10-CM

## 2018-04-27 DIAGNOSIS — Z5111 Encounter for antineoplastic chemotherapy: Secondary | ICD-10-CM | POA: Diagnosis not present

## 2018-04-27 MED ORDER — PEGFILGRASTIM INJECTION 6 MG/0.6ML ~~LOC~~
6.0000 mg | PREFILLED_SYRINGE | Freq: Once | SUBCUTANEOUS | Status: AC
  Administered 2018-04-27: 6 mg via SUBCUTANEOUS

## 2018-04-30 ENCOUNTER — Other Ambulatory Visit: Payer: Self-pay | Admitting: Urology

## 2018-05-01 ENCOUNTER — Telehealth: Payer: Self-pay | Admitting: *Deleted

## 2018-05-01 ENCOUNTER — Other Ambulatory Visit: Payer: Self-pay | Admitting: Internal Medicine

## 2018-05-01 NOTE — Telephone Encounter (Signed)
Patient advised of doctor response and in agreement to try this

## 2018-05-01 NOTE — Telephone Encounter (Signed)
Per Dr.B. Symptoms most likely related to neulasta. Patient needs to take Claritin as directed and also add Advil 200 mg three times day for 2-3 days. If symptoms worsen, patient needs to be evaluated by symptom mgmt.

## 2018-05-01 NOTE — Telephone Encounter (Signed)
Patient called to report that he is having cramps everywhere in his body back sides stomach legs etc and it got so bad he had to leave work and go home. He states his pharmacist told him to take 2 of his MVI for the Magnesium to see if that would help. He states he is better now and that the cramping lasted 10 minutes of severe. He had a preceding leg cramp prior to that. He had them early this morning when he got up. Please advise

## 2018-05-02 ENCOUNTER — Inpatient Hospital Stay: Payer: PPO

## 2018-05-02 DIAGNOSIS — C801 Malignant (primary) neoplasm, unspecified: Secondary | ICD-10-CM

## 2018-05-02 DIAGNOSIS — Z5111 Encounter for antineoplastic chemotherapy: Secondary | ICD-10-CM | POA: Diagnosis not present

## 2018-05-02 LAB — COMPREHENSIVE METABOLIC PANEL
ALK PHOS: 75 U/L (ref 38–126)
ALT: 23 U/L (ref 0–44)
AST: 21 U/L (ref 15–41)
Albumin: 3.8 g/dL (ref 3.5–5.0)
Anion gap: 8 (ref 5–15)
BILIRUBIN TOTAL: 1 mg/dL (ref 0.3–1.2)
BUN: 27 mg/dL — AB (ref 8–23)
CHLORIDE: 97 mmol/L — AB (ref 98–111)
CO2: 29 mmol/L (ref 22–32)
CREATININE: 1.28 mg/dL — AB (ref 0.61–1.24)
Calcium: 8.7 mg/dL — ABNORMAL LOW (ref 8.9–10.3)
GFR calc Af Amer: >60 mL/min (ref >60)
GFR, EST NON AFRICAN AMERICAN: 56 mL/min — AB (ref >60)
Glucose, Bld: 125 mg/dL — ABNORMAL HIGH (ref 70–99)
Potassium: 3.8 mmol/L (ref 3.5–5.1)
Sodium: 134 mmol/L — ABNORMAL LOW (ref 135–145)
Total Protein: 6.4 g/dL — ABNORMAL LOW (ref 6.5–8.1)

## 2018-05-02 LAB — CBC WITH DIFFERENTIAL/PLATELET
ABS IMMATURE GRANULOCYTES: 0.03 10*3/uL (ref 0.00–0.07)
Basophils Absolute: 0 10*3/uL (ref 0.0–0.1)
Basophils Relative: 1 %
EOS ABS: 0.1 10*3/uL (ref 0.0–0.5)
EOS PCT: 6 %
HCT: 38.8 % — ABNORMAL LOW (ref 39.0–52.0)
HEMOGLOBIN: 13.3 g/dL (ref 13.0–17.0)
Immature Granulocytes: 2 %
LYMPHS ABS: 0.5 10*3/uL — AB (ref 0.7–4.0)
LYMPHS PCT: 33 %
MCH: 30.1 pg (ref 26.0–34.0)
MCHC: 34.3 g/dL (ref 30.0–36.0)
MCV: 87.8 fL (ref 80.0–100.0)
MONO ABS: 0.2 10*3/uL (ref 0.1–1.0)
Monocytes Relative: 12 %
Neutro Abs: 0.7 10*3/uL — ABNORMAL LOW (ref 1.7–7.7)
Neutrophils Relative %: 46 %
Platelets: 139 10*3/uL — ABNORMAL LOW (ref 150–400)
RBC: 4.42 MIL/uL (ref 4.22–5.81)
RDW: 12.3 % (ref 11.5–15.5)
WBC: 1.5 10*3/uL — AB (ref 4.0–10.5)
nRBC: 0 % (ref 0.0–0.2)

## 2018-05-02 MED ORDER — HEPARIN SOD (PORK) LOCK FLUSH 100 UNIT/ML IV SOLN
500.0000 [IU] | Freq: Once | INTRAVENOUS | Status: AC
  Administered 2018-05-02: 500 [IU] via INTRAVENOUS

## 2018-05-02 MED ORDER — SODIUM CHLORIDE 0.9% FLUSH
10.0000 mL | INTRAVENOUS | Status: DC | PRN
  Administered 2018-05-02: 10 mL via INTRAVENOUS
  Filled 2018-05-02: qty 10

## 2018-05-04 ENCOUNTER — Telehealth: Payer: Self-pay | Admitting: Urology

## 2018-05-04 ENCOUNTER — Other Ambulatory Visit: Payer: Self-pay | Admitting: Internal Medicine

## 2018-05-04 DIAGNOSIS — C675 Malignant neoplasm of bladder neck: Secondary | ICD-10-CM

## 2018-05-04 NOTE — Telephone Encounter (Signed)
I am still waiting to send his referral to [facility] because I need his clinicals from his last office visit.   Thanks, Sharyn Lull

## 2018-05-04 NOTE — Telephone Encounter (Signed)
Thanks, no rush.  He will not need surgery until he has complete chemo which will be months from now.    Hollice Espy, MD

## 2018-05-07 ENCOUNTER — Other Ambulatory Visit: Payer: Self-pay | Admitting: Urology

## 2018-05-09 ENCOUNTER — Inpatient Hospital Stay: Payer: PPO

## 2018-05-09 ENCOUNTER — Inpatient Hospital Stay (HOSPITAL_BASED_OUTPATIENT_CLINIC_OR_DEPARTMENT_OTHER): Payer: PPO | Admitting: Internal Medicine

## 2018-05-09 ENCOUNTER — Other Ambulatory Visit: Payer: Self-pay

## 2018-05-09 ENCOUNTER — Ambulatory Visit
Admission: RE | Admit: 2018-05-09 | Discharge: 2018-05-09 | Disposition: A | Discharge status: Home / Self Care | Payer: PPO | Source: Ambulatory Visit | Attending: Internal Medicine | Admitting: Internal Medicine

## 2018-05-09 VITALS — BP 148/84 | HR 109 | Temp 96.5°F | Resp 20 | Wt 209.0 lb

## 2018-05-09 DIAGNOSIS — R05 Cough: Secondary | ICD-10-CM | POA: Insufficient documentation

## 2018-05-09 DIAGNOSIS — N1330 Unspecified hydronephrosis: Secondary | ICD-10-CM | POA: Diagnosis not present

## 2018-05-09 DIAGNOSIS — C675 Malignant neoplasm of bladder neck: Secondary | ICD-10-CM

## 2018-05-09 DIAGNOSIS — R059 Cough, unspecified: Secondary | ICD-10-CM

## 2018-05-09 DIAGNOSIS — J984 Other disorders of lung: Secondary | ICD-10-CM | POA: Insufficient documentation

## 2018-05-09 DIAGNOSIS — R918 Other nonspecific abnormal finding of lung field: Secondary | ICD-10-CM | POA: Diagnosis not present

## 2018-05-09 DIAGNOSIS — Z79899 Other long term (current) drug therapy: Secondary | ICD-10-CM | POA: Diagnosis not present

## 2018-05-09 DIAGNOSIS — R0602 Shortness of breath: Secondary | ICD-10-CM | POA: Diagnosis not present

## 2018-05-09 DIAGNOSIS — Z5111 Encounter for antineoplastic chemotherapy: Secondary | ICD-10-CM | POA: Diagnosis not present

## 2018-05-09 LAB — COMPREHENSIVE METABOLIC PANEL
ALBUMIN: 3.9 g/dL (ref 3.5–5.0)
ALK PHOS: 87 U/L (ref 38–126)
ALT: 22 U/L (ref 0–44)
AST: 27 U/L (ref 15–41)
Anion gap: 11 (ref 5–15)
BILIRUBIN TOTAL: 1 mg/dL (ref 0.3–1.2)
BUN: 18 mg/dL (ref 8–23)
CALCIUM: 9.1 mg/dL (ref 8.9–10.3)
CO2: 25 mmol/L (ref 22–32)
CREATININE: 1.33 mg/dL — AB (ref 0.61–1.24)
Chloride: 98 mmol/L (ref 98–111)
GFR calc Af Amer: >60 mL/min (ref >60)
GFR calc non Af Amer: 54 mL/min — ABNORMAL LOW (ref >60)
GLUCOSE: 171 mg/dL — AB (ref 70–99)
Potassium: 4 mmol/L (ref 3.5–5.1)
Sodium: 134 mmol/L — ABNORMAL LOW (ref 135–145)
TOTAL PROTEIN: 6.9 g/dL (ref 6.5–8.1)

## 2018-05-09 LAB — CBC WITH DIFFERENTIAL/PLATELET
Abs Immature Granulocytes: 1.01 10*3/uL — ABNORMAL HIGH (ref 0.00–0.07)
BASOS PCT: 1 %
Basophils Absolute: 0.1 10*3/uL (ref 0.0–0.1)
EOS ABS: 0.1 10*3/uL (ref 0.0–0.5)
Eosinophils Relative: 1 %
HEMATOCRIT: 39.7 % (ref 39.0–52.0)
HEMOGLOBIN: 13.3 g/dL (ref 13.0–17.0)
Immature Granulocytes: 8 %
LYMPHS ABS: 0.7 10*3/uL (ref 0.7–4.0)
Lymphocytes Relative: 5 %
MCH: 29.6 pg (ref 26.0–34.0)
MCHC: 33.5 g/dL (ref 30.0–36.0)
MCV: 88.4 fL (ref 80.0–100.0)
MONO ABS: 1.3 10*3/uL — AB (ref 0.1–1.0)
MONOS PCT: 10 %
Neutro Abs: 9.8 10*3/uL — ABNORMAL HIGH (ref 1.7–7.7)
Neutrophils Relative %: 75 %
Platelets: 148 10*3/uL — ABNORMAL LOW (ref 150–400)
RBC: 4.49 MIL/uL (ref 4.22–5.81)
RDW: 13.2 % (ref 11.5–15.5)
WBC: 12.9 10*3/uL — ABNORMAL HIGH (ref 4.0–10.5)
nRBC: 0 % (ref 0.0–0.2)

## 2018-05-09 MED ORDER — HEPARIN SOD (PORK) LOCK FLUSH 100 UNIT/ML IV SOLN
500.0000 [IU] | Freq: Once | INTRAVENOUS | Status: AC
  Administered 2018-05-09: 500 [IU] via INTRAVENOUS
  Filled 2018-05-09: qty 5

## 2018-05-09 MED ORDER — TAMSULOSIN HCL 0.4 MG PO CAPS: 0.4000 mg | ORAL_CAPSULE | Freq: Every day | ORAL | 2 refills | Status: DC

## 2018-05-09 MED ORDER — AZITHROMYCIN 250 MG PO TABS: ORAL_TABLET | ORAL | 0 refills | Status: DC

## 2018-05-09 MED ORDER — SODIUM CHLORIDE 0.9% FLUSH
10.0000 mL | Freq: Once | INTRAVENOUS | Status: AC
  Administered 2018-05-09: 10 mL via INTRAVENOUS
  Filled 2018-05-09: qty 10

## 2018-05-09 MED ORDER — DEXAMETHASONE SODIUM PHOSPHATE 10 MG/ML IJ SOLN
10.0000 mg | Freq: Once | INTRAMUSCULAR | Status: AC
  Administered 2018-05-09: 10 mg via INTRAVENOUS
  Filled 2018-05-09: qty 1

## 2018-05-09 MED ORDER — ALBUTEROL SULFATE HFA 108 (90 BASE) MCG/ACT IN AERS: 2.0000 | INHALATION_SPRAY | Freq: Four times a day (QID) | RESPIRATORY_TRACT | 2 refills | Status: DC | PRN

## 2018-05-09 MED ORDER — HEPARIN SOD (PORK) LOCK FLUSH 100 UNIT/ML IV SOLN
500.0000 [IU] | Freq: Once | INTRAVENOUS | Status: DC | PRN
  Filled 2018-05-09: qty 5

## 2018-05-09 MED ORDER — SODIUM CHLORIDE 0.9 % IV SOLN
Freq: Once | INTRAVENOUS | Status: AC
  Administered 2018-05-09: 10:00:00 via INTRAVENOUS
  Filled 2018-05-09: qty 250

## 2018-05-09 MED ORDER — SODIUM CHLORIDE 0.9 % IV SOLN: 10.0000 mg | Freq: Once | INTRAVENOUS | Status: DC

## 2018-05-09 NOTE — Patient Instructions (Signed)
#  Stop hydrochlorothiazide #Stop losartan #Drink plenty of fluids.  #Stop meloxicam/Advil/ibuprofen; can take Tylenol up to 2 pills every 6 hours as needed.

## 2018-05-09 NOTE — Assessment & Plan Note (Addendum)
#  Transitional cell bladder cancer pT2/ cT4 cN0- [question involving prostate]- stage II/III.  Currently on dose dense MVAC.  #Hold cycle #2 of chemotherapy today-acute bronchitis/renal failure [see discussion below].  #Acute bronchitis -recommend Z-Pak chest x-ray; dexamethasone today; albuterol inhaler.Marland Kitchen  #Acute renal insufficiency [# Right-sided hydronephrosis-secondary tumor-status post stenting.] creatinine 1.3 baseline on 1-1.1.  Multifactorial-recommend stopping NSAIDs; Cozaar hydrochlorothiazide.  Increase fluids.  Also add IV fluids today.  # Body aches/ joint pains- cramping of abdomen/flank-underlying acute bronchitis/coughing.  See above.    # DISPOSITION: # HOLD treatment today;but IVFs/dex today; CXR today. # follow up in 1 week/MD- labs- cbc/cmp; day-1 metho; d-2 VAC; d-3-Nuelasta- Dr.B

## 2018-05-09 NOTE — Progress Notes (Signed)
Here for follow up. Per pt having cramps in abd,back and sides from coughing he stated x 1 day stated sinus problems per pt .  Appetite down and sleeping more over 3 d per pt  No temp over these days he stated

## 2018-05-09 NOTE — Progress Notes (Signed)
Per Judeth Horn CMA per Dr. Rogue Bussing no treatment today or 05/10/18 pt to receive one liter NS over one hour and 10 mg IV Decadron.

## 2018-05-09 NOTE — Progress Notes (Signed)
[place] NOTE  Patient Care Team: Kirk Ruths, MD as PCP - General (Internal Medicine)  CHIEF COMPLAINTS/PURPOSE OF CONSULTATION:  Bladder cancer  #  Oncology History   # BLADDER CANCER: pT2/cpT4  prostate/bladder neck Dr.Brandon TURP s/p  - INVASIVE UROTHELIAL CARCINOMA, HIGH-GRADE;  TUMOR INVADES BLADDER NECK MUSCULARIS PROPRIA. Mild-mod Right hydronephrosis [HIGH RISK FEATURES]    # OCt [facility]  # 2 Echo- 50% to 55%. [oct 1610]   # COPD/ not on O2; [quit 2009];    DIAGNOSIS: BLADDER CA  STAGE:  PT2/cT4 cN0    ;GOALS: cure  CURRENT/MOST RECENT THERAPY : MVAC dd      Cancer of bladder neck ([place])   04/24/2018 -  Chemotherapy    The patient had DOXOrubicin (ADRIAMYCIN) chemo injection 64 mg, 30 mg/m2 = 64 mg, Intravenous,  Once, 1 of 4 cycles Administration: 64 mg (04/26/2018) palonosetron (ALOXI) injection 0.25 mg, 0.25 mg, Intravenous,  Once, 1 of 4 cycles Administration: 0.25 mg (04/26/2018) pegfilgrastim (NEULASTA) injection 6 mg, 6 mg, Subcutaneous, Once, 1 of 4 cycles Administration: 6 mg (04/27/2018) pegfilgrastim (NEULASTA ONPRO KIT) injection 6 mg, 6 mg, Subcutaneous, Once, 1 of 1 cycle Administration: 6 mg (04/26/2018) methotrexate (PF) 64.75 mg in sodium chloride 0.9 % 50 mL chemo infusion, 30 mg/m2 = 64.75 mg, Intravenous,  Once, 1 of 4 cycles Administration: 64.75 mg (04/25/2018) CISplatin (PLATINOL) 151 mg in sodium chloride 0.9 % 500 mL chemo infusion, 70 mg/m2 = 151 mg, Intravenous,  Once, 1 of 4 cycles Administration: 151 mg (04/26/2018) vinBLAStine (VELBAN) 6.5 mg in sodium chloride 0.9 % 50 mL chemo infusion, 3 mg/m2 = 6.5 mg, Intravenous, Once, 1 of 4 cycles Administration: 6.5 mg (04/26/2018) fosaprepitant (EMEND) 150 mg, dexamethasone (DECADRON) 12 mg in sodium chloride 0.9 % 145 mL IVPB, , Intravenous,  Once, 1 of 4 cycles Administration:  (04/26/2018)  for chemotherapy treatment.       HISTORY OF PRESENTING  ILLNESS:  Steve Gilbert 67 y.o.  male with newly diagnosed muscle invasive bladder cancer currently on neoadjuvant chemotherapy-currently status post cycle #1 of dose dense MVAC chemotherapy approximately 2 weeks ago is here for follow-up.  Patient complains of worsening difficulty breathing especially with exertion.  Coughing wheezing complains of cramping of the abdomen flank with coughing.  No fever chills.  Positive for phlegm.  No hemoptysis.  Denies any blood in urine.  He had one episode of nausea currently resolved.  Last here.  Review of Systems  Constitutional: Positive for malaise/fatigue. Negative for chills, diaphoresis, fever and weight loss.  HENT: Negative for nosebleeds and sore throat.   Eyes: Negative for double vision.  Respiratory: Positive for cough, sputum production, shortness of breath and wheezing.   Cardiovascular: Negative for chest pain, palpitations, orthopnea and leg swelling.  Gastrointestinal: Negative for abdominal pain, blood in stool, constipation, diarrhea, heartburn, melena, nausea and vomiting.  Genitourinary: Positive for flank pain. Negative for dysuria, frequency and urgency.  Musculoskeletal: Negative for back pain and joint pain.  Skin: Negative.  Negative for itching and rash.  Neurological: Negative for dizziness, tingling, focal weakness, weakness and headaches.  Endo/Heme/Allergies: Does not bruise/bleed easily.  Psychiatric/Behavioral: Negative for depression. The patient is not nervous/anxious and does not have insomnia.      MEDICAL HISTORY:  Past Medical History:  Diagnosis Date  . Acid reflux   . Allergy   . Cancer ([place])   . COPD (chronic obstructive pulmonary disease) ([place])   . High cholesterol   .  Hyperlipidemia 04/03/2018  . Hypertension   . Pneumonia     SURGICAL HISTORY: Past Surgical History:  Procedure Laterality Date  . CYSTOSCOPY WITH BIOPSY N/A 04/04/2018   Procedure: CYSTOSCOPY WITH TURBT;  Surgeon: Hollice Espy, MD;  Location: [facility];  Service: Urology;  Laterality: N/A;  . CYSTOSCOPY WITH URETEROSCOPY AND STENT PLACEMENT Right 04/04/2018   Procedure: CYSTOSCOPY WITH URETEROSCOPY AND STENT PLACEMENT;  Surgeon: Hollice Espy, MD;  Location: [facility];  Service: Urology;  Laterality: Right;  . NASAL SINUS SURGERY    . PORTA CATH INSERTION N/A 04/23/2018   Procedure: PORTA CATH INSERTION;  Surgeon: Algernon Huxley, MD;  Location: [facility];  Service: Cardiovascular;  Laterality: N/A;  . TRANSURETHRAL RESECTION OF PROSTATE N/A 04/04/2018   Procedure: TRANSURETHRAL RESECTION OF THE PROSTATE (TURP);  Surgeon: Hollice Espy, MD;  Location: [facility];  Service: Urology;  Laterality: N/A;  . URETERAL BIOPSY Right 04/04/2018   Procedure: URETERAL BIOPSY;  Surgeon: Hollice Espy, MD;  Location: [facility];  Service: Urology;  Laterality: Right;    SOCIAL HISTORY: pt lives with his wife; currently retired.  Works part-time.  Remote history of smoking.  No alcohol. Social History   Socioeconomic History  . Marital status: Married    Spouse name: Not on file  . Number of children: Not on file  . Years of education: Not on file  . Highest education level: Not on file  Occupational History  . Not on file  Social Needs  . Financial resource strain: Not on file  . Food insecurity:    Worry: Not on file    Inability: Not on file  . Transportation needs:    Medical: Not on file    Non-medical: Not on file  Tobacco Use  . Smoking status: Former Smoker    Packs/day: 1.50    Years: 30.00    Pack years: 45.00    Types: Cigarettes    Last attempt to quit: 2009    Years since quitting: 10.8  . Smokeless tobacco: Former Network engineer and Sexual Activity  . Alcohol use: Yes    Alcohol/week: 7.0 standard drinks    Types: 5 Cans of beer, 2 Standard drinks or equivalent per week    Comment: ocassional   . Drug use: Not Currently  . Sexual activity: Not on file  Lifestyle  . Physical  activity:    Days per week: Not on file    Minutes per session: Not on file  . Stress: Not on file  Relationships  . Social connections:    Talks on phone: Not on file    Gets together: Not on file    Attends religious service: Not on file    Active member of club or organization: Not on file    Attends meetings of clubs or organizations: Not on file    Relationship status: Not on file  . Intimate partner violence:    Fear of current or ex partner: Not on file    Emotionally abused: Not on file    Physically abused: Not on file    Forced sexual activity: Not on file  Other Topics Concern  . Not on file  Social History Narrative  . Not on file    FAMILY HISTORY: Family History  Problem Relation Age of Onset  . Diabetes Mother   . Heart disease Father   . Diabetes Brother   . Heart attack Brother   . Heart disease Brother  ALLERGIES:  is allergic to contrast media [iodinated diagnostic agents].  MEDICATIONS:  Current Outpatient Medications  Medication Sig Dispense Refill  . amLODipine (NORVASC) 10 MG tablet Take 10 mg by mouth daily.    Marland Kitchen aspirin EC 81 MG tablet Take 81 mg by mouth daily.    Marland Kitchen atorvastatin (LIPITOR) 20 MG tablet Take 20 mg by mouth daily.    Marland Kitchen azelastine (ASTELIN) 0.1 % nasal spray Place into both nostrils 2 (two) times daily. Use in each nostril as directed    . docusate sodium (COLACE) 100 MG capsule TAKE 1 CAPSULE BY MOUTH TWICE A DAY 60 capsule 0  . Fluticasone-Salmeterol (ADVAIR) 250-50 MCG/DOSE AEPB Inhale 1 puff into the lungs 2 (two) times daily.    . hydrochlorothiazide (HYDRODIURIL) 12.5 MG tablet TAKE 1 TABLET PO QD  9  . lidocaine-prilocaine (EMLA) cream Apply 1 application topically as needed. 30 g 3  . losartan (COZAAR) 100 MG tablet TK 1 T PO QD  9  . meloxicam (MOBIC) 15 MG tablet Take 1 tablet by mouth 1 day or 1 dose.  2  . Multiple Vitamins-Minerals (MULTIVITAMIN WITH MINERALS) tablet Take 1 tablet by mouth daily.    . pantoprazole  (PROTONIX) 40 MG tablet Take 40 mg by mouth daily.    . Saw Palmetto 450 MG CAPS Take 450 mg by mouth daily.    . tamsulosin (FLOMAX) 0.4 MG CAPS capsule Take 1 capsule (0.4 mg total) by mouth daily. 90 capsule 2  . tiotropium (SPIRIVA) 18 MCG inhalation capsule Place 18 mcg into inhaler and inhale daily.     Marland Kitchen albuterol (PROVENTIL HFA;VENTOLIN HFA) 108 (90 Base) MCG/ACT inhaler Inhale 2 puffs into the lungs every 6 (six) hours as needed for wheezing or shortness of breath. 1 Inhaler 2  . azithromycin (ZITHROMAX) 250 MG tablet Take 2 on day 1; and then 1 pill once a day. 6 each 0  . diphenhydrAMINE (BENADRYL) 50 MG tablet Take 1 tablet (50 mg total) by mouth once for 1 dose. 1 tablet 1 hour prior to CTscan 1 tablet 0  . HYDROcodone-acetaminophen (NORCO/VICODIN) 5-325 MG tablet Take 1-2 tablets by mouth every 6 (six) hours as needed for moderate pain. (Patient not taking: Reported on 05/09/2018) 10 tablet 0  . ondansetron (ZOFRAN) 8 MG tablet Take 1 tablet (8 mg total) by mouth every 8 (eight) hours as needed for nausea or vomiting. (Patient not taking: Reported on 05/09/2018) 20 tablet 3  . oxybutynin (DITROPAN) 5 MG tablet Take 1 tablet (5 mg total) by mouth every 8 (eight) hours as needed for bladder spasms. (Patient not taking: Reported on 05/09/2018) 30 tablet 0  . prochlorperazine (COMPAZINE) 10 MG tablet Take 1 tablet (10 mg total) by mouth every 6 (six) hours as needed for nausea or vomiting. (Patient not taking: Reported on 05/09/2018) 30 tablet 3  . ranitidine (ZANTAC) 150 MG tablet Take 1 tablet (150 mg total) by mouth once for 1 dose. Take 1 tablet 1 hour prior to CTscan (Patient not taking: Reported on 04/16/2018) 1 tablet 0   No current facility-administered medications for this visit.    Facility-Administered Medications Ordered in Other Visits  Medication Dose Route Frequency Provider Last Rate Last Dose  . heparin lock flush 100 unit/mL  500 Units Intravenous Once Cammie Sickle, MD          .  PHYSICAL EXAMINATION: ECOG PERFORMANCE STATUS: 1 - Symptomatic but completely ambulatory  Vitals:   05/09/18 0913  BP: Marland Kitchen)  148/84  Pulse: (!) 109  Resp: 20  Temp: (!) 96.5 F (35.8 C)   Filed Weights   05/09/18 0913  Weight: 209 lb (94.8 kg)    Physical Exam  Constitutional: He is oriented to person, place, and time and well-developed, well-nourished, and in no distress.  He is alone.  Walking himself.  HENT:  Head: Normocephalic and atraumatic.  Mouth/Throat: Oropharynx is clear and moist. No oropharyngeal exudate.  Eyes: Pupils are equal, round, and reactive to light.  Neck: Normal range of motion. Neck supple.  Cardiovascular: Normal rate and regular rhythm.  Pulmonary/Chest: No respiratory distress. He has wheezes.  Decreased bilateral air entry.  Positive for wheezing.  No crackles.  Abdominal: Soft. Bowel sounds are normal. He exhibits no distension and no mass. There is no tenderness. There is no rebound and no guarding.  Musculoskeletal: Normal range of motion. He exhibits no edema or tenderness.  Neurological: He is alert and oriented to person, place, and time.  Skin: Skin is warm.  Psychiatric: Affect normal.     LABORATORY DATA:  I have reviewed the data as listed Lab Results  Component Value Date   WBC 12.9 (H) 05/09/2018   HGB 13.3 05/09/2018   HCT 39.7 05/09/2018   MCV 88.4 05/09/2018   PLT 148 (L) 05/09/2018   Recent Labs    04/25/18 0919 05/02/18 1324 05/09/18 0900  NA 139 134* 134*  K 4.0 3.8 4.0  CL 102 97* 98  CO2 _0 GLUCOSE 125* 125* 171*  BUN 24* 27* 18  CREATININE 1.10 1.28* 1.33*  CALCIUM 9.3 8.7* 9.1  GFRNONAA >60 56* 54*  GFRAA >60 >60 >60  PROT 6.9 6.4* 6.9  ALBUMIN 4.0 3.8 3.9  AST _1 ALT _2 ALKPHOS 68 75 87  BILITOT 0.9 1.0 1.0    RADIOGRAPHIC STUDIES: I have personally reviewed the radiological images as listed and agreed with the findings in the report. Ct Chest Wo  Contrast  Result Date: 04/11/2018 CLINICAL DATA:  Bladder cancer, staging EXAM: CT CHEST WITHOUT CONTRAST TECHNIQUE: Multidetector CT imaging of the chest was performed following the standard protocol without IV contrast. COMPARISON:  None. FINDINGS: Cardiovascular: Heart is normal in size.  No pericardial effusion. No evidence of thoracic aortic aneurysm. Mild three-vessel coronary atherosclerosis. Mediastinum/Nodes: No suspicious mediastinal lymphadenopathy. Visualized thyroid is unremarkable. Lungs/Pleura: Mild scarring/atelectasis in the bilateral lower lobes. No suspicious pulmonary nodules. Mild centrilobular and paraseptal emphysematous changes, upper lobe predominant. No focal consolidation. No pleural effusion or pneumothorax. Upper Abdomen: Visualized upper abdomen is notable for a right ureteral stent. Musculoskeletal: Degenerative changes of the thoracic spine. IMPRESSION: No evidence of metastatic disease in the chest. Emphysema (ICD10-J43.9). Electronically Signed   By: Julian Hy M.D.   On: 04/11/2018 14:08    ASSESSMENT & PLAN:   Cancer of bladder neck ([facility]) #Transitional cell bladder cancer pT2/ cT4 cN0- [question involving prostate]- stage II/III.  Currently on dose dense MVAC.  #Hold cycle #2 of chemotherapy today-acute bronchitis/renal failure [see discussion below].  #Acute bronchitis -recommend Z-Pak chest x-ray; dexamethasone today; albuterol inhaler.Marland Kitchen  #Acute renal insufficiency [# Right-sided hydronephrosis-secondary tumor-status post stenting.] creatinine 1.3 baseline on 1-1.1.  Multifactorial-recommend stopping NSAIDs; Cozaar hydrochlorothiazide.  Increase fluids.  Also add IV fluids today.  # Body aches/ joint pains- cramping of abdomen/flank-underlying acute bronchitis/coughing.  See above.    # DISPOSITION: # HOLD treatment today;but IVFs/dex today; CXR today. # follow up in 1 week/MD- labs-  cbc/cmp; day-1 metho; d-2 VAC; d-3-Nuelasta- Dr.B    All  questions were answered. The patient knows to call the clinic with any problems, questions or concerns.       Cammie Sickle, MD 05/09/2018 10:08 AM

## 2018-05-10 ENCOUNTER — Inpatient Hospital Stay: Payer: PPO

## 2018-05-10 ENCOUNTER — Telehealth: Payer: Self-pay | Admitting: Internal Medicine

## 2018-05-10 DIAGNOSIS — J209 Acute bronchitis, unspecified: Secondary | ICD-10-CM

## 2018-05-10 NOTE — Telephone Encounter (Signed)
FYI-spoke to patient regarding chest x-ray possible focal pneumonia recommend repeating chest x-ray on 10/29-ordered; plenty of fluids.  Follow-up as planned

## 2018-05-11 ENCOUNTER — Other Ambulatory Visit: Payer: Self-pay | Admitting: Urology

## 2018-05-11 ENCOUNTER — Inpatient Hospital Stay: Payer: PPO

## 2018-05-14 ENCOUNTER — Other Ambulatory Visit: Payer: Self-pay

## 2018-05-14 DIAGNOSIS — C675 Malignant neoplasm of bladder neck: Secondary | ICD-10-CM

## 2018-05-15 ENCOUNTER — Ambulatory Visit
Admission: RE | Admit: 2018-05-15 | Discharge: 2018-05-15 | Disposition: A | Discharge status: Home / Self Care | Payer: PPO | Source: Ambulatory Visit | Attending: Internal Medicine | Admitting: Internal Medicine

## 2018-05-15 DIAGNOSIS — J189 Pneumonia, unspecified organism: Secondary | ICD-10-CM | POA: Diagnosis not present

## 2018-05-15 DIAGNOSIS — J209 Acute bronchitis, unspecified: Secondary | ICD-10-CM

## 2018-05-15 DIAGNOSIS — R918 Other nonspecific abnormal finding of lung field: Secondary | ICD-10-CM | POA: Insufficient documentation

## 2018-05-16 ENCOUNTER — Inpatient Hospital Stay: Payer: PPO

## 2018-05-16 ENCOUNTER — Inpatient Hospital Stay (HOSPITAL_BASED_OUTPATIENT_CLINIC_OR_DEPARTMENT_OTHER): Payer: PPO | Admitting: Internal Medicine

## 2018-05-16 VITALS — BP 145/92 | HR 109 | Temp 97.1°F | Resp 16 | Wt 207.0 lb

## 2018-05-16 DIAGNOSIS — N1330 Unspecified hydronephrosis: Secondary | ICD-10-CM

## 2018-05-16 DIAGNOSIS — J189 Pneumonia, unspecified organism: Secondary | ICD-10-CM | POA: Diagnosis not present

## 2018-05-16 DIAGNOSIS — Z79899 Other long term (current) drug therapy: Secondary | ICD-10-CM

## 2018-05-16 DIAGNOSIS — C675 Malignant neoplasm of bladder neck: Secondary | ICD-10-CM

## 2018-05-16 DIAGNOSIS — Z5111 Encounter for antineoplastic chemotherapy: Secondary | ICD-10-CM | POA: Diagnosis not present

## 2018-05-16 LAB — BASIC METABOLIC PANEL
ANION GAP: 7 (ref 5–15)
BUN: 16 mg/dL (ref 8–23)
CHLORIDE: 104 mmol/L (ref 98–111)
CO2: 29 mmol/L (ref 22–32)
Calcium: 9 mg/dL (ref 8.9–10.3)
Creatinine, Ser: 1.06 mg/dL (ref 0.61–1.24)
GFR calc non Af Amer: >60 mL/min (ref >60)
Glucose, Bld: 117 mg/dL — ABNORMAL HIGH (ref 70–99)
POTASSIUM: 4.1 mmol/L (ref 3.5–5.1)
SODIUM: 140 mmol/L (ref 135–145)

## 2018-05-16 LAB — CBC WITH DIFFERENTIAL/PLATELET
ABS IMMATURE GRANULOCYTES: 0.27 10*3/uL — AB (ref 0.00–0.07)
BASOS ABS: 0.1 10*3/uL (ref 0.0–0.1)
Basophils Relative: 1 %
EOS PCT: 0 %
Eosinophils Absolute: 0 10*3/uL (ref 0.0–0.5)
HCT: 40.9 % (ref 39.0–52.0)
HEMOGLOBIN: 13.6 g/dL (ref 13.0–17.0)
IMMATURE GRANULOCYTES: 4 %
LYMPHS PCT: 13 %
Lymphs Abs: 1 10*3/uL (ref 0.7–4.0)
MCH: 29.6 pg (ref 26.0–34.0)
MCHC: 33.3 g/dL (ref 30.0–36.0)
MCV: 89.1 fL (ref 80.0–100.0)
Monocytes Absolute: 0.9 10*3/uL (ref 0.1–1.0)
Monocytes Relative: 13 %
NEUTROS ABS: 5.2 10*3/uL (ref 1.7–7.7)
NRBC: 0 % (ref 0.0–0.2)
Neutrophils Relative %: 69 %
Platelets: 461 10*3/uL — ABNORMAL HIGH (ref 150–400)
RBC: 4.59 MIL/uL (ref 4.22–5.81)
RDW: 13.5 % (ref 11.5–15.5)
WBC: 7.5 10*3/uL (ref 4.0–10.5)

## 2018-05-16 MED ORDER — SODIUM CHLORIDE 0.9 % IV SOLN
Freq: Once | INTRAVENOUS | Status: AC
  Administered 2018-05-16: 10:00:00 via INTRAVENOUS
  Filled 2018-05-16: qty 250

## 2018-05-16 MED ORDER — METOPROLOL TARTRATE 25 MG PO TABS: 25.0000 mg | ORAL_TABLET | Freq: Two times a day (BID) | ORAL | 2 refills | Status: DC

## 2018-05-16 MED ORDER — SODIUM CHLORIDE 0.9 % IV SOLN
30.0000 mg/m2 | Freq: Once | INTRAVENOUS | Status: AC
  Administered 2018-05-16: 64.75 mg via INTRAVENOUS
  Filled 2018-05-16: qty 2.59

## 2018-05-16 MED ORDER — HEPARIN SOD (PORK) LOCK FLUSH 100 UNIT/ML IV SOLN
500.0000 [IU] | Freq: Once | INTRAVENOUS | Status: AC | PRN
  Administered 2018-05-16: 500 [IU]
  Filled 2018-05-16: qty 5

## 2018-05-16 MED ORDER — ONDANSETRON HCL 4 MG/2ML IJ SOLN
8.0000 mg | Freq: Once | INTRAMUSCULAR | Status: AC
  Administered 2018-05-16: 8 mg via INTRAVENOUS
  Filled 2018-05-16: qty 4

## 2018-05-16 MED ORDER — DEXAMETHASONE SODIUM PHOSPHATE 10 MG/ML IJ SOLN
10.0000 mg | Freq: Once | INTRAMUSCULAR | Status: AC
  Administered 2018-05-16: 10 mg via INTRAVENOUS
  Filled 2018-05-16: qty 1

## 2018-05-16 MED ORDER — SILODOSIN 4 MG PO CAPS: 4.0000 mg | ORAL_CAPSULE | Freq: Every day | ORAL | 2 refills | Status: DC

## 2018-05-16 NOTE — Progress Notes (Signed)
Per Dr. Rogue Bussing okay to proceed with treatment with BMP 05/16/18 and b/p 145/92
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
## 2018-05-16 NOTE — Assessment & Plan Note (Addendum)
#  Transitional cell bladder cancer pT2/ cT4 cN0- [question involving prostate]- stage II/III.  Currently on dose dense MVAC.  Cycle #2 held last week because of pneumonia; currently resolved [see discussion below]  # Proceed with cycle #2 today; Labs today reviewed;  acceptable for treatment today.  I will reach out to urology at [facility] as patient has not heard from them yet regarding appointment.  # Left lobe pneumonia s/p Abx; improved. CXR- improved.   #Acute renal insufficiency [# Right-sided hydronephrosis-secondary tumor-status post stenting.] creatinine 1.3 baseline on 1-1.1- improved.   # HTN- elevated-continue to HOLD HCTZ/ cozaar; continue norvasc; add metoprolol.   # DISPOSITION: # Treatment today # 1 week-labs- cbc/bmp; possible IVFs.  # follow up in 2 week/MD- labs- cbc/cmp; day-1 metho; d-2 VAC; d-3-[place]- Dr.B

## 2018-05-17 ENCOUNTER — Inpatient Hospital Stay: Payer: PPO

## 2018-05-17 VITALS — BP 143/88 | HR 87 | Temp 95.0°F | Resp 18

## 2018-05-17 DIAGNOSIS — Z5111 Encounter for antineoplastic chemotherapy: Secondary | ICD-10-CM | POA: Diagnosis not present

## 2018-05-17 DIAGNOSIS — C675 Malignant neoplasm of bladder neck: Secondary | ICD-10-CM

## 2018-05-17 MED ORDER — DOXORUBICIN HCL CHEMO IV INJECTION 2 MG/ML
30.0000 mg/m2 | Freq: Once | INTRAVENOUS | Status: AC
  Administered 2018-05-17: 64 mg via INTRAVENOUS
  Filled 2018-05-17: qty 25

## 2018-05-17 MED ORDER — HEPARIN SOD (PORK) LOCK FLUSH 100 UNIT/ML IV SOLN
500.0000 [IU] | Freq: Once | INTRAVENOUS | Status: AC | PRN
  Administered 2018-05-17: 500 [IU]
  Filled 2018-05-17: qty 5

## 2018-05-17 MED ORDER — SODIUM CHLORIDE 0.9 % IV SOLN
Freq: Once | INTRAVENOUS | Status: AC
  Administered 2018-05-17: 11:00:00 via INTRAVENOUS
  Filled 2018-05-17: qty 5

## 2018-05-17 MED ORDER — PALONOSETRON HCL INJECTION 0.25 MG/5ML
0.2500 mg | Freq: Once | INTRAVENOUS | Status: AC
  Administered 2018-05-17: 0.25 mg via INTRAVENOUS
  Filled 2018-05-17: qty 5

## 2018-05-17 MED ORDER — SODIUM CHLORIDE 0.9 % IV SOLN
70.0000 mg/m2 | Freq: Once | INTRAVENOUS | Status: AC
  Administered 2018-05-17: 151 mg via INTRAVENOUS
  Filled 2018-05-17: qty 151

## 2018-05-17 MED ORDER — POTASSIUM CHLORIDE 2 MEQ/ML IV SOLN
Freq: Once | INTRAVENOUS | Status: AC
  Administered 2018-05-17: 09:00:00 via INTRAVENOUS
  Filled 2018-05-17: qty 1000

## 2018-05-17 MED ORDER — VINBLASTINE SULFATE CHEMO INJECTION 1 MG/ML
3.0000 mg/m2 | Freq: Once | INTRAVENOUS | Status: AC
  Administered 2018-05-17: 6.5 mg via INTRAVENOUS
  Filled 2018-05-17: qty 6.5

## 2018-05-17 MED ORDER — SODIUM CHLORIDE 0.9 % IV SOLN
Freq: Once | INTRAVENOUS | Status: AC
  Administered 2018-05-17: 09:00:00 via INTRAVENOUS
  Filled 2018-05-17: qty 250

## 2018-05-18 ENCOUNTER — Inpatient Hospital Stay: Payer: PPO | Attending: Internal Medicine

## 2018-05-18 ENCOUNTER — Ambulatory Visit: Payer: PPO

## 2018-05-18 ENCOUNTER — Other Ambulatory Visit: Payer: Self-pay | Admitting: Internal Medicine

## 2018-05-18 DIAGNOSIS — N1330 Unspecified hydronephrosis: Secondary | ICD-10-CM | POA: Insufficient documentation

## 2018-05-18 DIAGNOSIS — C675 Malignant neoplasm of bladder neck: Secondary | ICD-10-CM

## 2018-05-18 DIAGNOSIS — I10 Essential (primary) hypertension: Secondary | ICD-10-CM | POA: Insufficient documentation

## 2018-05-18 DIAGNOSIS — Z79899 Other long term (current) drug therapy: Secondary | ICD-10-CM | POA: Diagnosis not present

## 2018-05-18 DIAGNOSIS — Z87891 Personal history of nicotine dependence: Secondary | ICD-10-CM | POA: Diagnosis not present

## 2018-05-18 DIAGNOSIS — Z5111 Encounter for antineoplastic chemotherapy: Secondary | ICD-10-CM | POA: Diagnosis not present

## 2018-05-18 DIAGNOSIS — J069 Acute upper respiratory infection, unspecified: Secondary | ICD-10-CM | POA: Diagnosis not present

## 2018-05-18 MED ORDER — PEGFILGRASTIM INJECTION 6 MG/0.6ML ~~LOC~~
6.0000 mg | PREFILLED_SYRINGE | Freq: Once | SUBCUTANEOUS | Status: AC
  Administered 2018-05-18: 6 mg via SUBCUTANEOUS

## 2018-05-23 ENCOUNTER — Inpatient Hospital Stay: Payer: PPO

## 2018-05-23 DIAGNOSIS — Z5111 Encounter for antineoplastic chemotherapy: Secondary | ICD-10-CM | POA: Diagnosis not present

## 2018-05-23 DIAGNOSIS — C675 Malignant neoplasm of bladder neck: Secondary | ICD-10-CM

## 2018-05-23 LAB — CBC WITH DIFFERENTIAL/PLATELET
Abs Immature Granulocytes: 0.11 10*3/uL — ABNORMAL HIGH (ref 0.00–0.07)
BASOS ABS: 0.1 10*3/uL (ref 0.0–0.1)
BASOS PCT: 2 %
EOS ABS: 0.1 10*3/uL (ref 0.0–0.5)
Eosinophils Relative: 2 %
HCT: 37.2 % — ABNORMAL LOW (ref 39.0–52.0)
HEMOGLOBIN: 12.3 g/dL — AB (ref 13.0–17.0)
Immature Granulocytes: 2 %
Lymphocytes Relative: 16 %
Lymphs Abs: 0.8 10*3/uL (ref 0.7–4.0)
MCH: 29.8 pg (ref 26.0–34.0)
MCHC: 33.1 g/dL (ref 30.0–36.0)
MCV: 90.1 fL (ref 80.0–100.0)
Monocytes Absolute: 0.4 10*3/uL (ref 0.1–1.0)
Monocytes Relative: 8 %
NEUTROS ABS: 3.3 10*3/uL (ref 1.7–7.7)
NEUTROS PCT: 70 %
NRBC: 0 % (ref 0.0–0.2)
PLATELETS: 270 10*3/uL (ref 150–400)
RBC: 4.13 MIL/uL — ABNORMAL LOW (ref 4.22–5.81)
RDW: 13.3 % (ref 11.5–15.5)
WBC: 4.8 10*3/uL (ref 4.0–10.5)

## 2018-05-23 LAB — COMPREHENSIVE METABOLIC PANEL
ALBUMIN: 3.7 g/dL (ref 3.5–5.0)
ALT: 22 U/L (ref 0–44)
ANION GAP: 5 (ref 5–15)
AST: 18 U/L (ref 15–41)
Alkaline Phosphatase: 99 U/L (ref 38–126)
BILIRUBIN TOTAL: 0.7 mg/dL (ref 0.3–1.2)
BUN: 21 mg/dL (ref 8–23)
CO2: 29 mmol/L (ref 22–32)
Calcium: 9.2 mg/dL (ref 8.9–10.3)
Chloride: 96 mmol/L — ABNORMAL LOW (ref 98–111)
Creatinine, Ser: 0.99 mg/dL (ref 0.61–1.24)
GFR calc non Af Amer: >60 mL/min (ref >60)
GLUCOSE: 115 mg/dL — AB (ref 70–99)
POTASSIUM: 4.1 mmol/L (ref 3.5–5.1)
SODIUM: 130 mmol/L — AB (ref 135–145)
TOTAL PROTEIN: 6.3 g/dL — AB (ref 6.5–8.1)

## 2018-05-23 MED ORDER — SODIUM CHLORIDE 0.9% FLUSH
10.0000 mL | Freq: Once | INTRAVENOUS | Status: AC
  Administered 2018-05-23: 10 mL via INTRAVENOUS
  Filled 2018-05-23: qty 10

## 2018-05-23 MED ORDER — HEPARIN SOD (PORK) LOCK FLUSH 100 UNIT/ML IV SOLN
500.0000 [IU] | Freq: Once | INTRAVENOUS | Status: AC
  Administered 2018-05-23: 500 [IU] via INTRAVENOUS
  Filled 2018-05-23: qty 5

## 2018-05-23 NOTE — Progress Notes (Signed)
No IVF today per Dr Rogue Bussing

## 2018-05-29 ENCOUNTER — Other Ambulatory Visit: Payer: Self-pay | Admitting: Internal Medicine

## 2018-05-29 DIAGNOSIS — M7582 Other shoulder lesions, left shoulder: Secondary | ICD-10-CM | POA: Diagnosis not present

## 2018-05-29 DIAGNOSIS — C675 Malignant neoplasm of bladder neck: Secondary | ICD-10-CM

## 2018-05-30 ENCOUNTER — Encounter: Payer: Self-pay | Admitting: Internal Medicine

## 2018-05-30 ENCOUNTER — Inpatient Hospital Stay (HOSPITAL_BASED_OUTPATIENT_CLINIC_OR_DEPARTMENT_OTHER): Payer: PPO | Admitting: Internal Medicine

## 2018-05-30 ENCOUNTER — Other Ambulatory Visit: Payer: Self-pay

## 2018-05-30 ENCOUNTER — Inpatient Hospital Stay: Payer: PPO

## 2018-05-30 VITALS — BP 138/88 | HR 76 | Temp 98.4°F | Resp 20 | Ht 67.0 in | Wt 213.7 lb

## 2018-05-30 DIAGNOSIS — J069 Acute upper respiratory infection, unspecified: Secondary | ICD-10-CM | POA: Diagnosis not present

## 2018-05-30 DIAGNOSIS — C675 Malignant neoplasm of bladder neck: Secondary | ICD-10-CM

## 2018-05-30 DIAGNOSIS — Z87891 Personal history of nicotine dependence: Secondary | ICD-10-CM

## 2018-05-30 DIAGNOSIS — Z79899 Other long term (current) drug therapy: Secondary | ICD-10-CM

## 2018-05-30 DIAGNOSIS — I10 Essential (primary) hypertension: Secondary | ICD-10-CM | POA: Diagnosis not present

## 2018-05-30 DIAGNOSIS — Z95828 Presence of other vascular implants and grafts: Secondary | ICD-10-CM

## 2018-05-30 DIAGNOSIS — Z5111 Encounter for antineoplastic chemotherapy: Secondary | ICD-10-CM | POA: Diagnosis not present

## 2018-05-30 DIAGNOSIS — N1330 Unspecified hydronephrosis: Secondary | ICD-10-CM | POA: Diagnosis not present

## 2018-05-30 LAB — COMPREHENSIVE METABOLIC PANEL
ALBUMIN: 4 g/dL (ref 3.5–5.0)
ALT: 29 U/L (ref 0–44)
ANION GAP: 8 (ref 5–15)
AST: 22 U/L (ref 15–41)
Alkaline Phosphatase: 92 U/L (ref 38–126)
BUN: 14 mg/dL (ref 8–23)
CHLORIDE: 103 mmol/L (ref 98–111)
CO2: 29 mmol/L (ref 22–32)
Calcium: 9.2 mg/dL (ref 8.9–10.3)
Creatinine, Ser: 1.1 mg/dL (ref 0.61–1.24)
GFR calc Af Amer: >60 mL/min (ref >60)
Glucose, Bld: 96 mg/dL (ref 70–99)
Potassium: 4.1 mmol/L (ref 3.5–5.1)
Sodium: 140 mmol/L (ref 135–145)
Total Bilirubin: 0.4 mg/dL (ref 0.3–1.2)
Total Protein: 6.5 g/dL (ref 6.5–8.1)

## 2018-05-30 LAB — CBC WITH DIFFERENTIAL/PLATELET
Abs Immature Granulocytes: 1.36 10*3/uL — ABNORMAL HIGH (ref 0.00–0.07)
BASOS ABS: 0.1 10*3/uL (ref 0.0–0.1)
BASOS PCT: 1 %
Eosinophils Absolute: 0.2 10*3/uL (ref 0.0–0.5)
Eosinophils Relative: 2 %
HCT: 39.8 % (ref 39.0–52.0)
HEMOGLOBIN: 13.3 g/dL (ref 13.0–17.0)
IMMATURE GRANULOCYTES: 12 %
LYMPHS PCT: 8 %
Lymphs Abs: 1 10*3/uL (ref 0.7–4.0)
MCH: 30.4 pg (ref 26.0–34.0)
MCHC: 33.4 g/dL (ref 30.0–36.0)
MCV: 91.1 fL (ref 80.0–100.0)
MONO ABS: 0.9 10*3/uL (ref 0.1–1.0)
Monocytes Relative: 8 %
Neutro Abs: 8.2 10*3/uL — ABNORMAL HIGH (ref 1.7–7.7)
Neutrophils Relative %: 69 %
PLATELETS: 116 10*3/uL — AB (ref 150–400)
RBC: 4.37 MIL/uL (ref 4.22–5.81)
RDW: 14.7 % (ref 11.5–15.5)
WBC: 11.8 10*3/uL — AB (ref 4.0–10.5)
nRBC: 0 % (ref 0.0–0.2)

## 2018-05-30 MED ORDER — HEPARIN SOD (PORK) LOCK FLUSH 100 UNIT/ML IV SOLN: 500.0000 [IU] | Freq: Once | INTRAVENOUS | Status: DC | PRN

## 2018-05-30 MED ORDER — DEXAMETHASONE SODIUM PHOSPHATE 10 MG/ML IJ SOLN
10.0000 mg | Freq: Once | INTRAMUSCULAR | Status: AC
  Administered 2018-05-30: 10 mg via INTRAVENOUS
  Filled 2018-05-30: qty 1

## 2018-05-30 MED ORDER — ONDANSETRON HCL 4 MG/2ML IJ SOLN
8.0000 mg | Freq: Once | INTRAMUSCULAR | Status: AC
  Administered 2018-05-30: 8 mg via INTRAVENOUS
  Filled 2018-05-30: qty 4

## 2018-05-30 MED ORDER — SODIUM CHLORIDE 0.9 % IV SOLN
30.0000 mg/m2 | Freq: Once | INTRAVENOUS | Status: AC
  Administered 2018-05-30: 64.75 mg via INTRAVENOUS
  Filled 2018-05-30: qty 2.59

## 2018-05-30 MED ORDER — HEPARIN SOD (PORK) LOCK FLUSH 100 UNIT/ML IV SOLN
500.0000 [IU] | Freq: Once | INTRAVENOUS | Status: AC
  Administered 2018-05-30: 500 [IU] via INTRAVENOUS
  Filled 2018-05-30: qty 5

## 2018-05-30 MED ORDER — SODIUM CHLORIDE 0.9% FLUSH
10.0000 mL | INTRAVENOUS | Status: DC | PRN
  Administered 2018-05-30: 10 mL via INTRAVENOUS
  Filled 2018-05-30: qty 10

## 2018-05-30 MED ORDER — SODIUM CHLORIDE 0.9 % IV SOLN
Freq: Once | INTRAVENOUS | Status: AC
  Administered 2018-05-30: 10:00:00 via INTRAVENOUS
  Filled 2018-05-30: qty 250

## 2018-05-30 NOTE — Progress Notes (Signed)
[facility] [facility] NOTE  Patient Care Team: Kirk Ruths, MD as PCP - General (Internal Medicine)  CHIEF COMPLAINTS/PURPOSE OF CONSULTATION:  Bladder cancer  #  Oncology History   # BLADDER CANCER: pT2/cpT4  prostate/bladder neck Dr.Brandon TURP s/p  - INVASIVE UROTHELIAL CARCINOMA, HIGH-GRADE;  TUMOR INVADES BLADDER NECK MUSCULARIS PROPRIA. Mild-mod Right hydronephrosis [HIGH RISK FEATURES]    # OCt 9t [place]  # 2 Echo- 50% to 55%. [oct 6440]   # COPD/ not on O2; [quit 2009];    DIAGNOSIS: BLADDER CA  STAGE:  PT2/cT4 cN0    ;GOALS: cure  CURRENT/MOST RECENT THERAPY : MVAC dd      Cancer of bladder neck ([place])   04/24/2018 -  Chemotherapy    The patient had DOXOrubicin (ADRIAMYCIN) chemo injection 64 mg, 30 mg/m2 = 64 mg, Intravenous,  Once, 3 of 4 cycles Administration: 64 mg (04/26/2018), 64 mg (05/17/2018) palonosetron (ALOXI) injection 0.25 mg, 0.25 mg, Intravenous,  Once, 3 of 4 cycles Administration: 0.25 mg (04/26/2018), 0.25 mg (05/17/2018) pegfilgrastim (NEULASTA) injection 6 mg, 6 mg, Subcutaneous, Once, 3 of 4 cycles Administration: 6 mg (04/27/2018), 6 mg (05/18/2018) pegfilgrastim (NEULASTA ONPRO KIT) injection 6 mg, 6 mg, Subcutaneous, Once, 1 of 1 cycle Administration: 6 mg (04/26/2018) methotrexate (PF) 64.75 mg in sodium chloride 0.9 % 50 mL chemo infusion, 30 mg/m2 = 64.75 mg, Intravenous,  Once, 3 of 4 cycles Administration: 64.75 mg (04/25/2018), 64.75 mg (05/16/2018), 64.75 mg (05/30/2018) CISplatin (PLATINOL) 151 mg in sodium chloride 0.9 % 500 mL chemo infusion, 70 mg/m2 = 151 mg, Intravenous,  Once, 3 of 4 cycles Administration: 151 mg (04/26/2018), 151 mg (05/17/2018) vinBLAStine (VELBAN) 6.5 mg in sodium chloride 0.9 % 50 mL chemo infusion, 3 mg/m2 = 6.5 mg, Intravenous, Once, 3 of 4 cycles Administration: 6.5 mg (04/26/2018), 6.5 mg (05/17/2018) fosaprepitant (EMEND) 150 mg, dexamethasone (DECADRON) 12 mg in sodium chloride  0.9 % 145 mL IVPB, , Intravenous,  Once, 3 of 4 cycles Administration:  (04/26/2018),  (05/17/2018)  for chemotherapy treatment.       HISTORY OF PRESENTING ILLNESS:  Steve Gilbert 67 y.o.  male with newly diagnosed muscle invasive bladder cancer currently on neoadjuvant chemotherapy-currently status post cycle #2 of dose dense MVAC chemotherapy approximately  2 weeks ago is here for follow-up.  Patient denies any worsening shortness of breath or cough.  Complains of sinus pressure; nasal discharge.  Chronic.  Not any worse.  Denies any tingling or numbness but denies any nausea vomiting.  No headaches.  No blood in urine.  Review of Systems  Constitutional: Negative for chills, diaphoresis, fever and weight loss.  HENT: Negative for nosebleeds and sore throat.   Eyes: Negative for double vision.  Cardiovascular: Negative for chest pain, palpitations, orthopnea and leg swelling.  Gastrointestinal: Negative for abdominal pain, blood in stool, constipation, diarrhea, heartburn, melena, nausea and vomiting.  Genitourinary: Negative for dysuria, frequency and urgency.  Musculoskeletal: Negative for back pain and joint pain.  Skin: Negative.  Negative for itching and rash.  Neurological: Negative for dizziness, tingling, focal weakness, weakness and headaches.  Endo/Heme/Allergies: Does not bruise/bleed easily.  Psychiatric/Behavioral: Negative for depression. The patient is not nervous/anxious and does not have insomnia.      MEDICAL HISTORY:  Past Medical History:  Diagnosis Date  . Acid reflux   . Allergy   . Cancer ([place])   . COPD (chronic obstructive pulmonary disease) ([place])   . High cholesterol   . Hyperlipidemia 04/03/2018  .  Hypertension   . Pneumonia     SURGICAL HISTORY: Past Surgical History:  Procedure Laterality Date  . CYSTOSCOPY WITH BIOPSY N/A 04/04/2018   Procedure: CYSTOSCOPY WITH TURBT;  Surgeon: Hollice Espy, MD;  Location: [facility];  Service: Urology;   Laterality: N/A;  . CYSTOSCOPY WITH URETEROSCOPY AND STENT PLACEMENT Right 04/04/2018   Procedure: CYSTOSCOPY WITH URETEROSCOPY AND STENT PLACEMENT;  Surgeon: Hollice Espy, MD;  Location: [facility];  Service: Urology;  Laterality: Right;  . NASAL SINUS SURGERY    . PORTA CATH INSERTION N/A 04/23/2018   Procedure: PORTA CATH INSERTION;  Surgeon: Algernon Huxley, MD;  Location: [facility];  Service: Cardiovascular;  Laterality: N/A;  . TRANSURETHRAL RESECTION OF PROSTATE N/A 04/04/2018   Procedure: TRANSURETHRAL RESECTION OF THE PROSTATE (TURP);  Surgeon: Hollice Espy, MD;  Location: [facility];  Service: Urology;  Laterality: N/A;  . URETERAL BIOPSY Right 04/04/2018   Procedure: URETERAL BIOPSY;  Surgeon: Hollice Espy, MD;  Location: [facility];  Service: Urology;  Laterality: Right;    SOCIAL HISTORY: pt lives with his wife; currently retired.  Works part-time.  Remote history of smoking.  No alcohol. Social History   Socioeconomic History  . Marital status: Married    Spouse name: Not on file  . Number of children: Not on file  . Years of education: Not on file  . Highest education level: Not on file  Occupational History  . Not on file  Social Needs  . Financial resource strain: Not on file  . Food insecurity:    Worry: Not on file    Inability: Not on file  . Transportation needs:    Medical: Not on file    Non-medical: Not on file  Tobacco Use  . Smoking status: Former Smoker    Packs/day: 1.50    Years: 30.00    Pack years: 45.00    Types: Cigarettes    Last attempt to quit: 2009    Years since quitting: 10.8  . Smokeless tobacco: Former Network engineer and Sexual Activity  . Alcohol use: Yes    Alcohol/week: 7.0 standard drinks    Types: 5 Cans of beer, 2 Standard drinks or equivalent per week    Comment: ocassional   . Drug use: Not Currently  . Sexual activity: Not on file  Lifestyle  . Physical activity:    Days per week: Not on file    Minutes per  session: Not on file  . Stress: Not on file  Relationships  . Social connections:    Talks on phone: Not on file    Gets together: Not on file    Attends religious service: Not on file    Active member of club or organization: Not on file    Attends meetings of clubs or organizations: Not on file    Relationship status: Not on file  . Intimate partner violence:    Fear of current or ex partner: Not on file    Emotionally abused: Not on file    Physically abused: Not on file    Forced sexual activity: Not on file  Other Topics Concern  . Not on file  Social History Narrative  . Not on file    FAMILY HISTORY: Family History  Problem Relation Age of Onset  . Diabetes Mother   . Heart disease Father   . Diabetes Brother   . Heart attack Brother   . Heart disease Brother     ALLERGIES:  is allergic to contrast media [iodinated diagnostic agents].  MEDICATIONS:  Current Outpatient Medications  Medication Sig Dispense Refill  . albuterol (PROVENTIL HFA;VENTOLIN HFA) 108 (90 Base) MCG/ACT inhaler Inhale 2 puffs into the lungs every 6 (six) hours as needed for wheezing or shortness of breath. 1 Inhaler 2  . amLODipine (NORVASC) 10 MG tablet Take 10 mg by mouth daily.    Marland Kitchen aspirin EC 81 MG tablet Take 81 mg by mouth daily.    Marland Kitchen atorvastatin (LIPITOR) 20 MG tablet Take 20 mg by mouth daily.    Marland Kitchen azelastine (ASTELIN) 0.1 % nasal spray Place into both nostrils 2 (two) times daily. Use in each nostril as directed    . docusate sodium (COLACE) 100 MG capsule TAKE 1 CAPSULE BY MOUTH TWICE A DAY 60 capsule 0  . Fluticasone-Salmeterol (ADVAIR) 250-50 MCG/DOSE AEPB Inhale 1 puff into the lungs 2 (two) times daily.    . hydrochlorothiazide (HYDRODIURIL) 12.5 MG tablet TAKE 1 TABLET PO QD  9  . HYDROcodone-acetaminophen (NORCO/VICODIN) 5-325 MG tablet Take 1-2 tablets by mouth every 6 (six) hours as needed for moderate pain. 10 tablet 0  . lidocaine-prilocaine (EMLA) cream Apply 1 application  topically as needed. 30 g 3  . losartan (COZAAR) 100 MG tablet TK 1 T PO QD  9  . meloxicam (MOBIC) 15 MG tablet Take 1 tablet by mouth 1 day or 1 dose.  2  . metoprolol tartrate (LOPRESSOR) 25 MG tablet Take 1 tablet (25 mg total) by mouth 2 (two) times daily. 60 tablet 2  . Multiple Vitamins-Minerals (MULTIVITAMIN WITH MINERALS) tablet Take 1 tablet by mouth daily.    . ondansetron (ZOFRAN) 8 MG tablet Take 1 tablet (8 mg total) by mouth every 8 (eight) hours as needed for nausea or vomiting. 20 tablet 3  . oxybutynin (DITROPAN) 5 MG tablet Take 1 tablet (5 mg total) by mouth every 8 (eight) hours as needed for bladder spasms. 30 tablet 0  . pantoprazole (PROTONIX) 40 MG tablet Take 40 mg by mouth daily.    . prochlorperazine (COMPAZINE) 10 MG tablet Take 1 tablet (10 mg total) by mouth every 6 (six) hours as needed for nausea or vomiting. 30 tablet 3  . Saw Palmetto 450 MG CAPS Take 450 mg by mouth daily.    . silodosin (RAPAFLO) 4 MG CAPS capsule Take 1 capsule (4 mg total) by mouth daily with breakfast. 90 capsule 2  . tamsulosin (FLOMAX) 0.4 MG CAPS capsule TAKE 1 CAPSULE BY MOUTH EVERY DAY 90 capsule 0  . tiotropium (SPIRIVA) 18 MCG inhalation capsule Place 18 mcg into inhaler and inhale daily.      No current facility-administered medications for this visit.    Facility-Administered Medications Ordered in Other Visits  Medication Dose Route Frequency Provider Last Rate Last Dose  . heparin lock flush 100 unit/mL  500 Units Intracatheter Once PRN Charlaine Dalton R, MD      . sodium chloride flush (NS) 0.9 % injection 10 mL  10 mL Intravenous PRN Cammie Sickle, MD   10 mL at 05/30/18 0908      .  PHYSICAL EXAMINATION: ECOG PERFORMANCE STATUS: 1 - Symptomatic but completely ambulatory  Vitals:   05/30/18 0921 05/30/18 0927  BP:  138/88  Pulse:  76  Resp: 20   Temp:  98.4 F (36.9 C)   Filed Weights   05/30/18 0921  Weight: 213 lb 11.2 oz (96.9 kg)    Physical  Exam  Constitutional:  He is oriented to person, place, and time and well-developed, well-nourished, and in no distress.  He is alone.  Walking himself.  HENT:  Head: Normocephalic and atraumatic.  Mouth/Throat: Oropharynx is clear and moist. No oropharyngeal exudate.  Eyes: Pupils are equal, round, and reactive to light.  Neck: Normal range of motion. Neck supple.  Cardiovascular: Normal rate and regular rhythm.  Pulmonary/Chest: Breath sounds normal. No respiratory distress.  Abdominal: Soft. Bowel sounds are normal. He exhibits no distension and no mass. There is no tenderness. There is no rebound and no guarding.  Musculoskeletal: Normal range of motion. He exhibits no edema or tenderness.  Neurological: He is alert and oriented to person, place, and time.  Skin: Skin is warm.  Psychiatric: Affect normal.     LABORATORY DATA:  I have reviewed the data as listed Lab Results  Component Value Date   WBC 11.8 (H) 05/30/2018   HGB 13.3 05/30/2018   HCT 39.8 05/30/2018   MCV 91.1 05/30/2018   PLT 116 (L) 05/30/2018   Recent Labs    05/09/18 0900 05/16/18 0857 05/23/18 1320 05/30/18 0903  NA 134* 140 130* 140  K 4.0 4.1 4.1 4.1  CL 98 104 96* 103  CO2 '25 29 29 29  ' GLUCOSE 171* 117* 115* 96  BUN '18 16 21 14  ' CREATININE 1.33* 1.06 0.99 1.10  CALCIUM 9.1 9.0 9.2 9.2  GFRNONAA 54* >60 >60 >60  GFRAA >60 >60 >60 >60  PROT 6.9  --  6.3* 6.5  ALBUMIN 3.9  --  3.7 4.0  AST 27  --  18 22  ALT 22  --  22 29  ALKPHOS 87  --  99 92  BILITOT 1.0  --  0.7 0.4    RADIOGRAPHIC STUDIES: I have personally reviewed the radiological images as listed and agreed with the findings in the report. Dg Chest 2 View  Result Date: 05/15/2018 CLINICAL DATA:  Left lung pneumonia. EXAM: CHEST - 2 VIEW COMPARISON:  05/09/2018. FINDINGS: PowerPort catheter noted with tip over superior vena cava. Mediastinum hilar structures normal. Mild left mid lung field infiltrate, improved from prior exam  noted. Continued follow-up exam suggested demonstrate complete clearing. No pleural effusion or pneumothorax. Degenerative change in scoliosis thoracic spine. IMPRESSION: 1. Mild left mid lung field infiltrate, improved from prior exam. Continued follow-up exam suggested to demonstrate complete clearing. 2. PowerPort catheter noted with tip over superior vena cava in stable position. Electronically Signed   By: Marcello Moores  Register   On: 05/15/2018 12:46   Dg Chest 2 View  Result Date: 05/09/2018 CLINICAL DATA:  Cough and shortness of breath. History of prostate and urinary bladder carcinoma EXAM: CHEST - 2 VIEW COMPARISON:  Chest radiograph May 17, 2010; chest CT April 11, 2018 FINDINGS: Port-A-Cath tip is in the superior vena cava near the junction with the right jugular vein. No pneumothorax. There is ill-defined opacity in the left mid lung laterally, not appreciable on recent CT. There is mild scarring in the right base. The lungs elsewhere are clear. Heart size and pulmonary vascularity are normal. No adenopathy. There is degenerative change in the thoracic spine. No blastic or lytic bone lesions evident. IMPRESSION: Ill-defined opacity focally in the left mid lung, concerning for focal pneumonia. Mild scarring right base. Lungs elsewhere are clear. No evident adenopathy. Followup PA and lateral chest radiographs recommended in 3-4 weeks following trial of antibiotic therapy to ensure resolution and exclude underlying malignancy. Electronically Signed   By: Gwyndolyn Saxon  Jasmine December III M.D.   On: 05/09/2018 12:29    ASSESSMENT & PLAN:   Cancer of bladder neck ([place]) #Transitional cell bladder cancer pT2/ cT4 cN0- [question involving prostate]- stage II/III.  Currently on dose dense MVAC. S/p cycle #3 today.   # Proceed with cycle #3; Labs today reviewed;  acceptable for treatment today; platelets-116.  Discussed with urology Dr. Erlene Quan regarding referral to urology for cystectomy evaluation.  #  URI-recommend antihistamines/decongestants  #Acute renal insufficiency [# Right-sided hydronephrosis-secondary tumor-status post stenting.] Improved.    # HTN- STABLE; continue norvasc/ metoprolol.   # DISPOSITION: # Treatment today # 1 week-labs- cbc/bmp; possible IVFs.  # follow up on Dec 2nd/MD- labs- cbc/cmp; day-1 metho; dec 3rd VAC; dec 4th-Neulasta- Dr.B  All questions were answered. The patient knows to call the clinic with any problems, questions or concerns.     Cammie Sickle, MD 05/30/2018 12:48 PM

## 2018-05-30 NOTE — Assessment & Plan Note (Addendum)
#  Transitional cell bladder cancer pT2/ cT4 cN0- [question involving prostate]- stage II/III.  Currently on dose dense MVAC. S/p cycle #3 today.   # Proceed with cycle #3; Labs today reviewed;  acceptable for treatment today; platelets-116.  Discussed with urology Dr. Erlene Quan regarding referral to urology for cystectomy evaluation.  # URI-recommend antihistamines/decongestants  #Acute renal insufficiency [# Right-sided hydronephrosis-secondary tumor-status post stenting.] Improved.    # HTN- STABLE; continue norvasc/ metoprolol.   # DISPOSITION: # Treatment today # 1 week-labs- cbc/bmp; possible IVFs.  # follow up on Dec 2nd/MD- labs- cbc/cmp; day-1 metho; dec 3rd VAC; dec 4th-Neulasta- Dr.B

## 2018-05-30 NOTE — Progress Notes (Signed)
Patient here for treatment no changes since last appointment.

## 2018-05-31 ENCOUNTER — Inpatient Hospital Stay: Payer: PPO

## 2018-05-31 ENCOUNTER — Telehealth: Payer: Self-pay | Admitting: Urology

## 2018-05-31 VITALS — BP 132/87 | HR 96 | Temp 97.3°F | Resp 18

## 2018-05-31 DIAGNOSIS — C675 Malignant neoplasm of bladder neck: Secondary | ICD-10-CM

## 2018-05-31 DIAGNOSIS — Z5111 Encounter for antineoplastic chemotherapy: Secondary | ICD-10-CM | POA: Diagnosis not present

## 2018-05-31 MED ORDER — FAMOTIDINE IN NACL 20-0.9 MG/50ML-% IV SOLN: 20.0000 mg | Freq: Once | INTRAVENOUS | Status: DC

## 2018-05-31 MED ORDER — HEPARIN SOD (PORK) LOCK FLUSH 100 UNIT/ML IV SOLN
500.0000 [IU] | Freq: Once | INTRAVENOUS | Status: DC | PRN
  Filled 2018-05-31: qty 5

## 2018-05-31 MED ORDER — SODIUM CHLORIDE 0.9 % IV SOLN
70.0000 mg/m2 | Freq: Once | INTRAVENOUS | Status: AC
  Administered 2018-05-31: 151 mg via INTRAVENOUS
  Filled 2018-05-31: qty 151

## 2018-05-31 MED ORDER — DOXORUBICIN HCL CHEMO IV INJECTION 2 MG/ML
30.0000 mg/m2 | Freq: Once | INTRAVENOUS | Status: AC
  Administered 2018-05-31: 64 mg via INTRAVENOUS
  Filled 2018-05-31: qty 25

## 2018-05-31 MED ORDER — SODIUM CHLORIDE 0.9 % IV SOLN
Freq: Once | INTRAVENOUS | Status: AC
  Administered 2018-05-31: 09:00:00 via INTRAVENOUS
  Filled 2018-05-31: qty 250

## 2018-05-31 MED ORDER — SODIUM CHLORIDE 0.9 % IV SOLN
Freq: Once | INTRAVENOUS | Status: AC
  Administered 2018-05-31: 11:00:00 via INTRAVENOUS
  Filled 2018-05-31: qty 5

## 2018-05-31 MED ORDER — POTASSIUM CHLORIDE 2 MEQ/ML IV SOLN
Freq: Once | INTRAVENOUS | Status: AC
  Administered 2018-05-31: 09:00:00 via INTRAVENOUS
  Filled 2018-05-31: qty 1000

## 2018-05-31 MED ORDER — VINBLASTINE SULFATE CHEMO INJECTION 1 MG/ML
3.0000 mg/m2 | Freq: Once | INTRAVENOUS | Status: AC
  Administered 2018-05-31: 6.5 mg via INTRAVENOUS
  Filled 2018-05-31: qty 6.5

## 2018-05-31 MED ORDER — PALONOSETRON HCL INJECTION 0.25 MG/5ML
0.2500 mg | Freq: Once | INTRAVENOUS | Status: AC
  Administered 2018-05-31: 0.25 mg via INTRAVENOUS
  Filled 2018-05-31: qty 5

## 2018-05-31 MED ORDER — SODIUM CHLORIDE 0.9 % IV SOLN
Freq: Once | INTRAVENOUS | Status: AC
  Administered 2018-05-31: 11:00:00 via INTRAVENOUS
  Filled 2018-05-31: qty 50

## 2018-05-31 NOTE — Telephone Encounter (Signed)
Patient has been scheduled for 06-25-18 @ [facility] with Dr. Joellen Jersey

## 2018-05-31 NOTE — Progress Notes (Signed)
Pt reports severe indigestion last night, took PO reflux meds x3 without relief, Dr B notified. Pepcid 20 mg IV added today per Dr B and pt instructed to take Prilosec 20 mg BID 30 min prior to meals x 7 days per Dr B. Pt verbalizes understanding.

## 2018-06-01 ENCOUNTER — Inpatient Hospital Stay: Payer: PPO

## 2018-06-01 DIAGNOSIS — C675 Malignant neoplasm of bladder neck: Secondary | ICD-10-CM

## 2018-06-01 DIAGNOSIS — Z5111 Encounter for antineoplastic chemotherapy: Secondary | ICD-10-CM | POA: Diagnosis not present

## 2018-06-01 MED ORDER — PEGFILGRASTIM INJECTION 6 MG/0.6ML ~~LOC~~
6.0000 mg | PREFILLED_SYRINGE | Freq: Once | SUBCUTANEOUS | Status: AC
  Administered 2018-06-01: 6 mg via SUBCUTANEOUS

## 2018-06-06 ENCOUNTER — Inpatient Hospital Stay: Payer: PPO

## 2018-06-06 VITALS — BP 124/78 | HR 82 | Temp 97.0°F | Resp 20

## 2018-06-06 DIAGNOSIS — Z5111 Encounter for antineoplastic chemotherapy: Secondary | ICD-10-CM | POA: Diagnosis not present

## 2018-06-06 DIAGNOSIS — C675 Malignant neoplasm of bladder neck: Secondary | ICD-10-CM

## 2018-06-06 LAB — COMPREHENSIVE METABOLIC PANEL
ALT: 22 U/L (ref 0–44)
ANION GAP: 7 (ref 5–15)
AST: 18 U/L (ref 15–41)
Albumin: 3.7 g/dL (ref 3.5–5.0)
Alkaline Phosphatase: 98 U/L (ref 38–126)
BUN: 23 mg/dL (ref 8–23)
CHLORIDE: 102 mmol/L (ref 98–111)
CO2: 27 mmol/L (ref 22–32)
Calcium: 9.1 mg/dL (ref 8.9–10.3)
Creatinine, Ser: 1 mg/dL (ref 0.61–1.24)
GLUCOSE: 131 mg/dL — AB (ref 70–99)
Potassium: 4.5 mmol/L (ref 3.5–5.1)
Sodium: 136 mmol/L (ref 135–145)
TOTAL PROTEIN: 6.1 g/dL — AB (ref 6.5–8.1)
Total Bilirubin: 0.6 mg/dL (ref 0.3–1.2)

## 2018-06-06 LAB — CBC WITH DIFFERENTIAL/PLATELET
Abs Immature Granulocytes: 0.34 10*3/uL — ABNORMAL HIGH (ref 0.00–0.07)
BASOS ABS: 0.1 10*3/uL (ref 0.0–0.1)
BASOS PCT: 1 %
Eosinophils Absolute: 0 10*3/uL (ref 0.0–0.5)
Eosinophils Relative: 0 %
HEMATOCRIT: 36 % — AB (ref 39.0–52.0)
Hemoglobin: 11.9 g/dL — ABNORMAL LOW (ref 13.0–17.0)
Immature Granulocytes: 7 %
Lymphocytes Relative: 12 %
Lymphs Abs: 0.6 10*3/uL — ABNORMAL LOW (ref 0.7–4.0)
MCH: 30.2 pg (ref 26.0–34.0)
MCHC: 33.1 g/dL (ref 30.0–36.0)
MCV: 91.4 fL (ref 80.0–100.0)
MONOS PCT: 6 %
Monocytes Absolute: 0.3 10*3/uL (ref 0.1–1.0)
NEUTROS ABS: 3.3 10*3/uL (ref 1.7–7.7)
NEUTROS PCT: 74 %
NRBC: 0 % (ref 0.0–0.2)
PLATELETS: 107 10*3/uL — AB (ref 150–400)
RBC: 3.94 MIL/uL — AB (ref 4.22–5.81)
RDW: 14.6 % (ref 11.5–15.5)
WBC: 4.6 10*3/uL (ref 4.0–10.5)

## 2018-06-06 MED ORDER — HEPARIN SOD (PORK) LOCK FLUSH 100 UNIT/ML IV SOLN
500.0000 [IU] | Freq: Once | INTRAVENOUS | Status: AC
  Administered 2018-06-06: 500 [IU] via INTRAVENOUS
  Filled 2018-06-06: qty 5

## 2018-06-06 MED ORDER — SODIUM CHLORIDE 0.9% FLUSH
10.0000 mL | Freq: Once | INTRAVENOUS | Status: AC
  Administered 2018-06-06: 10 mL via INTRAVENOUS
  Filled 2018-06-06: qty 10

## 2018-06-06 MED ORDER — SODIUM CHLORIDE 0.9 % IV SOLN
Freq: Once | INTRAVENOUS | Status: AC
  Administered 2018-06-06: 13:00:00 via INTRAVENOUS
  Filled 2018-06-06: qty 250

## 2018-06-18 ENCOUNTER — Inpatient Hospital Stay: Payer: PPO

## 2018-06-18 ENCOUNTER — Other Ambulatory Visit: Payer: Self-pay

## 2018-06-18 ENCOUNTER — Inpatient Hospital Stay (HOSPITAL_BASED_OUTPATIENT_CLINIC_OR_DEPARTMENT_OTHER): Payer: PPO | Admitting: Internal Medicine

## 2018-06-18 ENCOUNTER — Inpatient Hospital Stay: Payer: PPO | Attending: Internal Medicine

## 2018-06-18 ENCOUNTER — Encounter: Payer: Self-pay | Admitting: Internal Medicine

## 2018-06-18 VITALS — BP 122/81 | HR 76 | Temp 98.7°F | Resp 20 | Ht 67.0 in | Wt 215.0 lb

## 2018-06-18 DIAGNOSIS — Z5111 Encounter for antineoplastic chemotherapy: Secondary | ICD-10-CM | POA: Insufficient documentation

## 2018-06-18 DIAGNOSIS — C675 Malignant neoplasm of bladder neck: Secondary | ICD-10-CM

## 2018-06-18 DIAGNOSIS — Z79899 Other long term (current) drug therapy: Secondary | ICD-10-CM

## 2018-06-18 DIAGNOSIS — I10 Essential (primary) hypertension: Secondary | ICD-10-CM | POA: Insufficient documentation

## 2018-06-18 DIAGNOSIS — N1330 Unspecified hydronephrosis: Secondary | ICD-10-CM | POA: Insufficient documentation

## 2018-06-18 DIAGNOSIS — Z87891 Personal history of nicotine dependence: Secondary | ICD-10-CM | POA: Insufficient documentation

## 2018-06-18 LAB — CBC WITH DIFFERENTIAL/PLATELET
Abs Immature Granulocytes: 0.28 10*3/uL — ABNORMAL HIGH (ref 0.00–0.07)
Basophils Absolute: 0.1 10*3/uL (ref 0.0–0.1)
Basophils Relative: 1 %
Eosinophils Absolute: 0.1 10*3/uL (ref 0.0–0.5)
Eosinophils Relative: 1 %
HEMATOCRIT: 35.2 % — AB (ref 39.0–52.0)
HEMOGLOBIN: 11.8 g/dL — AB (ref 13.0–17.0)
Immature Granulocytes: 4 %
LYMPHS ABS: 0.8 10*3/uL (ref 0.7–4.0)
LYMPHS PCT: 11 %
MCH: 31.1 pg (ref 26.0–34.0)
MCHC: 33.5 g/dL (ref 30.0–36.0)
MCV: 92.6 fL (ref 80.0–100.0)
MONO ABS: 0.6 10*3/uL (ref 0.1–1.0)
Monocytes Relative: 8 %
NEUTROS ABS: 5.4 10*3/uL (ref 1.7–7.7)
NEUTROS PCT: 75 %
Platelets: 376 10*3/uL (ref 150–400)
RBC: 3.8 MIL/uL — AB (ref 4.22–5.81)
RDW: 16.9 % — ABNORMAL HIGH (ref 11.5–15.5)
WBC: 7.1 10*3/uL (ref 4.0–10.5)
nRBC: 0 % (ref 0.0–0.2)

## 2018-06-18 LAB — COMPREHENSIVE METABOLIC PANEL
ALBUMIN: 3.7 g/dL (ref 3.5–5.0)
ALT: 22 U/L (ref 0–44)
ANION GAP: 7 (ref 5–15)
AST: 24 U/L (ref 15–41)
Alkaline Phosphatase: 75 U/L (ref 38–126)
BUN: 9 mg/dL (ref 8–23)
CO2: 27 mmol/L (ref 22–32)
Calcium: 8.6 mg/dL — ABNORMAL LOW (ref 8.9–10.3)
Chloride: 106 mmol/L (ref 98–111)
Creatinine, Ser: 1.08 mg/dL (ref 0.61–1.24)
GFR calc Af Amer: >60 mL/min (ref >60)
GFR calc non Af Amer: >60 mL/min (ref >60)
GLUCOSE: 191 mg/dL — AB (ref 70–99)
POTASSIUM: 3.8 mmol/L (ref 3.5–5.1)
SODIUM: 140 mmol/L (ref 135–145)
Total Bilirubin: 0.4 mg/dL (ref 0.3–1.2)
Total Protein: 6.2 g/dL — ABNORMAL LOW (ref 6.5–8.1)

## 2018-06-18 MED ORDER — HEPARIN SOD (PORK) LOCK FLUSH 100 UNIT/ML IV SOLN
500.0000 [IU] | Freq: Once | INTRAVENOUS | Status: AC | PRN
  Administered 2018-06-18: 500 [IU]
  Filled 2018-06-18 (×2): qty 5

## 2018-06-18 MED ORDER — DEXAMETHASONE SODIUM PHOSPHATE 10 MG/ML IJ SOLN
10.0000 mg | Freq: Once | INTRAMUSCULAR | Status: AC
  Administered 2018-06-18: 10 mg via INTRAVENOUS
  Filled 2018-06-18: qty 1

## 2018-06-18 MED ORDER — SODIUM CHLORIDE 0.9 % IV SOLN
Freq: Once | INTRAVENOUS | Status: AC
  Administered 2018-06-18: 10:00:00 via INTRAVENOUS
  Filled 2018-06-18: qty 250

## 2018-06-18 MED ORDER — SODIUM CHLORIDE 0.9 % IV SOLN
30.0000 mg/m2 | Freq: Once | INTRAVENOUS | Status: AC
  Administered 2018-06-18: 64.75 mg via INTRAVENOUS
  Filled 2018-06-18: qty 2.59

## 2018-06-18 MED ORDER — ONDANSETRON HCL 4 MG/2ML IJ SOLN
8.0000 mg | Freq: Once | INTRAMUSCULAR | Status: AC
  Administered 2018-06-18: 8 mg via INTRAVENOUS
  Filled 2018-06-18: qty 4

## 2018-06-18 NOTE — Progress Notes (Signed)
[place] NOTE  Patient Care Team: Kirk Ruths, MD as PCP - General (Internal Medicine)  CHIEF COMPLAINTS/PURPOSE OF CONSULTATION:  Bladder cancer  #  Oncology History   # BLADDER CANCER: pT2/cpT4  prostate/bladder neck Dr.Brandon TURP s/p  - INVASIVE UROTHELIAL CARCINOMA, HIGH-GRADE;  TUMOR INVADES BLADDER NECK MUSCULARIS PROPRIA. Mild-mod Right hydronephrosis [HIGH RISK FEATURES]    # OCt 9t [place]  # 2 Echo- 50% to 55%. [oct 7017]   # COPD/ not on O2; [quit 2009];    DIAGNOSIS: BLADDER CA  STAGE:  PT2/cT4 cN0    ;GOALS: cure  CURRENT/MOST RECENT THERAPY : MVAC dd      Cancer of bladder neck ([place])   04/24/2018 -  Chemotherapy    The patient had DOXOrubicin (ADRIAMYCIN) chemo injection 64 mg, 30 mg/m2 = 64 mg, Intravenous,  Once, 4 of 4 cycles Administration: 64 mg (04/26/2018), 64 mg (05/17/2018), 64 mg (05/31/2018) palonosetron (ALOXI) injection 0.25 mg, 0.25 mg, Intravenous,  Once, 4 of 4 cycles Administration: 0.25 mg (04/26/2018), 0.25 mg (05/17/2018), 0.25 mg (05/31/2018) pegfilgrastim (NEULASTA) injection 6 mg, 6 mg, Subcutaneous, Once, 4 of 4 cycles Administration: 6 mg (04/27/2018), 6 mg (05/18/2018), 6 mg (06/01/2018) pegfilgrastim (NEULASTA ONPRO KIT) injection 6 mg, 6 mg, Subcutaneous, Once, 1 of 1 cycle Administration: 6 mg (04/26/2018) methotrexate (PF) 64.75 mg in sodium chloride 0.9 % 50 mL chemo infusion, 30 mg/m2 = 64.75 mg, Intravenous,  Once, 4 of 4 cycles Administration: 64.75 mg (04/25/2018), 64.75 mg (05/16/2018), 64.75 mg (05/30/2018), 64.75 mg (06/18/2018) CISplatin (PLATINOL) 151 mg in sodium chloride 0.9 % 500 mL chemo infusion, 70 mg/m2 = 151 mg, Intravenous,  Once, 4 of 4 cycles Administration: 151 mg (04/26/2018), 151 mg (05/17/2018), 151 mg (05/31/2018) vinBLAStine (VELBAN) 6.5 mg in sodium chloride 0.9 % 50 mL chemo infusion, 3 mg/m2 = 6.5 mg, Intravenous, Once, 4 of 4 cycles Administration: 6.5 mg  (04/26/2018), 6.5 mg (05/17/2018), 6.5 mg (05/31/2018) fosaprepitant (EMEND) 150 mg, dexamethasone (DECADRON) 12 mg in sodium chloride 0.9 % 145 mL IVPB, , Intravenous,  Once, 4 of 4 cycles Administration:  (04/26/2018),  (05/17/2018),  (05/31/2018)  for chemotherapy treatment.       HISTORY OF PRESENTING ILLNESS:  Steve Gilbert 67 y.o.  male with newly diagnosed muscle invasive bladder cancer currently on neoadjuvant chemotherapy-currently status post cycle #3 of dose dense MVAC chemotherapy approximately  2 weeks ago is here for follow-up.  Patient denies any significant tingling and numbness of his extremities.  Denies any nausea vomiting.  Complains of bilateral tearing of his eyes.  Denies any chest pain or shortness of breath or cough.  Denies any bleeding urine.  Review of Systems  Constitutional: Negative for chills, diaphoresis, fever and weight loss.  HENT: Negative for nosebleeds and sore throat.   Eyes: Negative for double vision.  Cardiovascular: Negative for chest pain, palpitations, orthopnea and leg swelling.  Gastrointestinal: Negative for abdominal pain, blood in stool, constipation, diarrhea, heartburn, melena, nausea and vomiting.  Genitourinary: Negative for dysuria, frequency and urgency.  Musculoskeletal: Negative for back pain and joint pain.  Skin: Negative.  Negative for itching and rash.  Neurological: Negative for dizziness, tingling, focal weakness, weakness and headaches.  Endo/Heme/Allergies: Does not bruise/bleed easily.  Psychiatric/Behavioral: Negative for depression. The patient is not nervous/anxious and does not have insomnia.      MEDICAL HISTORY:  Past Medical History:  Diagnosis Date  . Acid reflux   . Allergy   . Cancer ([place])   .  COPD (chronic obstructive pulmonary disease) ([place])   . High cholesterol   . Hyperlipidemia 04/03/2018  . Hypertension   . Pneumonia     SURGICAL HISTORY: Past Surgical History:  Procedure Laterality Date   . CYSTOSCOPY WITH BIOPSY N/A 04/04/2018   Procedure: CYSTOSCOPY WITH TURBT;  Surgeon: Hollice Espy, MD;  Location: [facility];  Service: Urology;  Laterality: N/A;  . CYSTOSCOPY WITH URETEROSCOPY AND STENT PLACEMENT Right 04/04/2018   Procedure: CYSTOSCOPY WITH URETEROSCOPY AND STENT PLACEMENT;  Surgeon: Hollice Espy, MD;  Location: [facility];  Service: Urology;  Laterality: Right;  . NASAL SINUS SURGERY    . PORTA CATH INSERTION N/A 04/23/2018   Procedure: PORTA CATH INSERTION;  Surgeon: Algernon Huxley, MD;  Location: [facility];  Service: Cardiovascular;  Laterality: N/A;  . TRANSURETHRAL RESECTION OF PROSTATE N/A 04/04/2018   Procedure: TRANSURETHRAL RESECTION OF THE PROSTATE (TURP);  Surgeon: Hollice Espy, MD;  Location: [facility];  Service: Urology;  Laterality: N/A;  . URETERAL BIOPSY Right 04/04/2018   Procedure: URETERAL BIOPSY;  Surgeon: Hollice Espy, MD;  Location: [facility];  Service: Urology;  Laterality: Right;    SOCIAL HISTORY: pt lives with his wife; currently retired.  Works part-time.  Remote history of smoking.  No alcohol. Social History   Socioeconomic History  . Marital status: Married    Spouse name: Not on file  . Number of children: Not on file  . Years of education: Not on file  . Highest education level: Not on file  Occupational History  . Not on file  Social Needs  . Financial resource strain: Not on file  . Food insecurity:    Worry: Not on file    Inability: Not on file  . Transportation needs:    Medical: Not on file    Non-medical: Not on file  Tobacco Use  . Smoking status: Former Smoker    Packs/day: 1.50    Years: 30.00    Pack years: 45.00    Types: Cigarettes    Last attempt to quit: 2009    Years since quitting: 10.9  . Smokeless tobacco: Former Network engineer and Sexual Activity  . Alcohol use: Yes    Alcohol/week: 7.0 standard drinks    Types: 5 Cans of beer, 2 Standard drinks or equivalent per week    Comment:  ocassional   . Drug use: Not Currently  . Sexual activity: Not on file  Lifestyle  . Physical activity:    Days per week: Not on file    Minutes per session: Not on file  . Stress: Not on file  Relationships  . Social connections:    Talks on phone: Not on file    Gets together: Not on file    Attends religious service: Not on file    Active member of club or organization: Not on file    Attends meetings of clubs or organizations: Not on file    Relationship status: Not on file  . Intimate partner violence:    Fear of current or ex partner: Not on file    Emotionally abused: Not on file    Physically abused: Not on file    Forced sexual activity: Not on file  Other Topics Concern  . Not on file  Social History Narrative  . Not on file    FAMILY HISTORY: Family History  Problem Relation Age of Onset  . Diabetes Mother   . Heart disease Father   . Diabetes  Brother   . Heart attack Brother   . Heart disease Brother     ALLERGIES:  is allergic to contrast media [iodinated diagnostic agents].  MEDICATIONS:  Current Outpatient Medications  Medication Sig Dispense Refill  . albuterol (PROVENTIL HFA;VENTOLIN HFA) 108 (90 Base) MCG/ACT inhaler Inhale 2 puffs into the lungs every 6 (six) hours as needed for wheezing or shortness of breath. 1 Inhaler 2  . amLODipine (NORVASC) 10 MG tablet Take 10 mg by mouth daily.    Marland Kitchen aspirin EC 81 MG tablet Take 81 mg by mouth daily.    Marland Kitchen atorvastatin (LIPITOR) 20 MG tablet Take 20 mg by mouth daily.    Marland Kitchen azelastine (ASTELIN) 0.1 % nasal spray Place into both nostrils 2 (two) times daily. Use in each nostril as directed    . docusate sodium (COLACE) 100 MG capsule TAKE 1 CAPSULE BY MOUTH TWICE A DAY 60 capsule 0  . Fluticasone-Salmeterol (ADVAIR) 250-50 MCG/DOSE AEPB Inhale 1 puff into the lungs 2 (two) times daily.    . hydrochlorothiazide (HYDRODIURIL) 12.5 MG tablet TAKE 1 TABLET PO QD  9  . lidocaine-prilocaine (EMLA) cream Apply 1  application topically as needed. 30 g 3  . metoprolol tartrate (LOPRESSOR) 25 MG tablet Take 1 tablet (25 mg total) by mouth 2 (two) times daily. 60 tablet 2  . Multiple Vitamins-Minerals (MULTIVITAMIN WITH MINERALS) tablet Take 1 tablet by mouth daily.    . pantoprazole (PROTONIX) 40 MG tablet Take 40 mg by mouth daily.    . silodosin (RAPAFLO) 4 MG CAPS capsule Take 1 capsule (4 mg total) by mouth daily with breakfast. 90 capsule 2  . tamsulosin (FLOMAX) 0.4 MG CAPS capsule TAKE 1 CAPSULE BY MOUTH EVERY DAY 90 capsule 0  . ondansetron (ZOFRAN) 8 MG tablet Take 1 tablet (8 mg total) by mouth every 8 (eight) hours as needed for nausea or vomiting. (Patient not taking: Reported on 06/18/2018) 20 tablet 3  . prochlorperazine (COMPAZINE) 10 MG tablet Take 1 tablet (10 mg total) by mouth every 6 (six) hours as needed for nausea or vomiting. (Patient not taking: Reported on 06/18/2018) 30 tablet 3  . Saw Palmetto 450 MG CAPS Take 450 mg by mouth daily.    Marland Kitchen tiotropium (SPIRIVA) 18 MCG inhalation capsule Place 18 mcg into inhaler and inhale daily.      No current facility-administered medications for this visit.       Marland Kitchen  PHYSICAL EXAMINATION: ECOG PERFORMANCE STATUS: 1 - Symptomatic but completely ambulatory  Vitals:   06/18/18 0900  BP: 122/81  Pulse: 76  Resp: 20  Temp: 98.7 F (37.1 C)   Filed Weights   06/18/18 0908  Weight: 215 lb (97.5 kg)    Physical Exam  Constitutional: He is oriented to person, place, and time and well-developed, well-nourished, and in no distress.  He is alone.  Walking himself.  HENT:  Head: Normocephalic and atraumatic.  Mouth/Throat: Oropharynx is clear and moist. No oropharyngeal exudate.  Eyes: Pupils are equal, round, and reactive to light.  Neck: Normal range of motion. Neck supple.  Cardiovascular: Normal rate and regular rhythm.  Pulmonary/Chest: Breath sounds normal. No respiratory distress.  Abdominal: Soft. Bowel sounds are normal. He  exhibits no distension and no mass. There is no tenderness. There is no rebound and no guarding.  Musculoskeletal: Normal range of motion. He exhibits no edema or tenderness.  Neurological: He is alert and oriented to person, place, and time.  Skin: Skin is warm.  Psychiatric: Affect normal.     LABORATORY DATA:  I have reviewed the data as listed Lab Results  Component Value Date   WBC 7.1 06/18/2018   HGB 11.8 (L) 06/18/2018   HCT 35.2 (L) 06/18/2018   MCV 92.6 06/18/2018   PLT 376 06/18/2018   Recent Labs    05/30/18 0903 06/06/18 1308 06/18/18 0840  NA 140 136 140  K 4.1 4.5 3.8  CL 103 102 106  CO2 '29 27 27  ' GLUCOSE 96 131* 191*  BUN '14 23 9  ' CREATININE 1.10 1.00 1.08  CALCIUM 9.2 9.1 8.6*  GFRNONAA >60 >60 >60  GFRAA >60 >60 >60  PROT 6.5 6.1* 6.2*  ALBUMIN 4.0 3.7 3.7  AST '22 18 24  ' ALT '29 22 22  ' ALKPHOS 92 98 75  BILITOT 0.4 0.6 0.4    RADIOGRAPHIC STUDIES: I have personally reviewed the radiological images as listed and agreed with the findings in the report. No results found.  ASSESSMENT & PLAN:   Cancer of bladder neck ([place]) #Transitional cell bladder cancer pT2/ cT4 cN0- [question involving prostate]- stage II/III.  Currently on dose dense MVAC.   # Proceed with cycle #4; Labs today reviewed;  acceptable for treatment today; Awaiting evaluation with Urology; Dr.Manny on 12/09. Will decided on pre-op imaging after discussion with Dr.Manny.   # Eye tearing- recommend topical anihistamine.   #Acute renal insufficiency [# Right-sided hydronephrosis-secondary tumor-status post stenting.] resolved.   # HTN- continue norvasc/ metoprolol. STABLE.   # DISPOSITION: # Treatment today/as planned this week.  # 1 week-labs- cbc/bmp; possible IVFs.  # follow up in 2 week/labs- MD; no chemo  All questions were answered. The patient knows to call the clinic with any problems, questions or concerns.     Cammie Sickle, MD 06/18/2018 5:10 PM

## 2018-06-18 NOTE — Assessment & Plan Note (Addendum)
#  Transitional cell bladder cancer pT2/ cT4 cN0- [question involving prostate]- stage II/III.  Currently on dose dense MVAC.   # Proceed with cycle #4; Labs today reviewed;  acceptable for treatment today; Awaiting evaluation with Urology; Dr.Manny on 12/09. Will decided on pre-op imaging after discussion with Dr.Manny.   # Eye tearing- recommend topical anihistamine.   #Acute renal insufficiency [# Right-sided hydronephrosis-secondary tumor-status post stenting.] resolved.   # HTN- continue norvasc/ metoprolol. STABLE.   # DISPOSITION: # Treatment today/as planned this week.  # 1 week-labs- cbc/bmp; possible IVFs.  # follow up in 2 week/labs- MD; no chemo

## 2018-06-19 ENCOUNTER — Inpatient Hospital Stay: Payer: PPO

## 2018-06-19 VITALS — BP 135/84 | HR 94 | Temp 95.0°F | Resp 18

## 2018-06-19 DIAGNOSIS — Z5111 Encounter for antineoplastic chemotherapy: Secondary | ICD-10-CM | POA: Diagnosis not present

## 2018-06-19 DIAGNOSIS — C675 Malignant neoplasm of bladder neck: Secondary | ICD-10-CM

## 2018-06-19 MED ORDER — PALONOSETRON HCL INJECTION 0.25 MG/5ML
0.2500 mg | Freq: Once | INTRAVENOUS | Status: AC
  Administered 2018-06-19: 0.25 mg via INTRAVENOUS
  Filled 2018-06-19: qty 5

## 2018-06-19 MED ORDER — VINBLASTINE SULFATE CHEMO INJECTION 1 MG/ML
3.0000 mg/m2 | Freq: Once | INTRAVENOUS | Status: AC
  Administered 2018-06-19: 6.5 mg via INTRAVENOUS
  Filled 2018-06-19: qty 6.5

## 2018-06-19 MED ORDER — POTASSIUM CHLORIDE 2 MEQ/ML IV SOLN
Freq: Once | INTRAVENOUS | Status: AC
  Administered 2018-06-19: 09:00:00 via INTRAVENOUS
  Filled 2018-06-19: qty 1000

## 2018-06-19 MED ORDER — SODIUM CHLORIDE 0.9 % IV SOLN
70.0000 mg/m2 | Freq: Once | INTRAVENOUS | Status: AC
  Administered 2018-06-19: 151 mg via INTRAVENOUS
  Filled 2018-06-19: qty 151

## 2018-06-19 MED ORDER — HEPARIN SOD (PORK) LOCK FLUSH 100 UNIT/ML IV SOLN
500.0000 [IU] | Freq: Once | INTRAVENOUS | Status: AC | PRN
  Administered 2018-06-19: 500 [IU]
  Filled 2018-06-19: qty 5

## 2018-06-19 MED ORDER — SODIUM CHLORIDE 0.9 % IV SOLN
Freq: Once | INTRAVENOUS | Status: AC
  Administered 2018-06-19: 11:00:00 via INTRAVENOUS
  Filled 2018-06-19: qty 5

## 2018-06-19 MED ORDER — SODIUM CHLORIDE 0.9 % IV SOLN
Freq: Once | INTRAVENOUS | Status: AC
  Administered 2018-06-19: 08:00:00 via INTRAVENOUS
  Filled 2018-06-19: qty 250

## 2018-06-19 MED ORDER — DOXORUBICIN HCL CHEMO IV INJECTION 2 MG/ML
30.0000 mg/m2 | Freq: Once | INTRAVENOUS | Status: AC
  Administered 2018-06-19: 64 mg via INTRAVENOUS
  Filled 2018-06-19: qty 32

## 2018-06-19 NOTE — Progress Notes (Signed)
Blood return noted before, every 3 cc and after Adriamycin push. Blood return noted before and after Vinblastine infusion.  

## 2018-06-20 ENCOUNTER — Inpatient Hospital Stay: Payer: PPO

## 2018-06-20 DIAGNOSIS — C675 Malignant neoplasm of bladder neck: Secondary | ICD-10-CM

## 2018-06-20 DIAGNOSIS — Z5111 Encounter for antineoplastic chemotherapy: Secondary | ICD-10-CM | POA: Diagnosis not present

## 2018-06-20 MED ORDER — PEGFILGRASTIM INJECTION 6 MG/0.6ML ~~LOC~~
6.0000 mg | PREFILLED_SYRINGE | Freq: Once | SUBCUTANEOUS | Status: AC
  Administered 2018-06-20: 6 mg via SUBCUTANEOUS

## 2018-06-25 ENCOUNTER — Inpatient Hospital Stay: Payer: PPO

## 2018-06-25 DIAGNOSIS — Z5111 Encounter for antineoplastic chemotherapy: Secondary | ICD-10-CM | POA: Diagnosis not present

## 2018-06-25 DIAGNOSIS — R3915 Urgency of urination: Secondary | ICD-10-CM | POA: Diagnosis not present

## 2018-06-25 DIAGNOSIS — C678 Malignant neoplasm of overlapping sites of bladder: Secondary | ICD-10-CM | POA: Diagnosis not present

## 2018-06-25 DIAGNOSIS — C675 Malignant neoplasm of bladder neck: Secondary | ICD-10-CM

## 2018-06-25 DIAGNOSIS — N130 Hydronephrosis with ureteropelvic junction obstruction: Secondary | ICD-10-CM | POA: Diagnosis not present

## 2018-06-25 LAB — CBC WITH DIFFERENTIAL/PLATELET
ABS IMMATURE GRANULOCYTES: 0.04 10*3/uL (ref 0.00–0.07)
Basophils Absolute: 0.1 10*3/uL (ref 0.0–0.1)
Basophils Relative: 1 %
EOS PCT: 1 %
Eosinophils Absolute: 0 10*3/uL (ref 0.0–0.5)
HCT: 32.8 % — ABNORMAL LOW (ref 39.0–52.0)
Hemoglobin: 11 g/dL — ABNORMAL LOW (ref 13.0–17.0)
Immature Granulocytes: 1 %
LYMPHS PCT: 14 %
Lymphs Abs: 0.6 10*3/uL — ABNORMAL LOW (ref 0.7–4.0)
MCH: 31 pg (ref 26.0–34.0)
MCHC: 33.5 g/dL (ref 30.0–36.0)
MCV: 92.4 fL (ref 80.0–100.0)
Monocytes Absolute: 0.4 10*3/uL (ref 0.1–1.0)
Monocytes Relative: 9 %
Neutro Abs: 3.1 10*3/uL (ref 1.7–7.7)
Neutrophils Relative %: 74 %
Platelets: 232 10*3/uL (ref 150–400)
RBC: 3.55 MIL/uL — ABNORMAL LOW (ref 4.22–5.81)
RDW: 16.2 % — ABNORMAL HIGH (ref 11.5–15.5)
WBC: 4.2 10*3/uL (ref 4.0–10.5)
nRBC: 0 % (ref 0.0–0.2)

## 2018-06-25 LAB — BASIC METABOLIC PANEL
Anion gap: 8 (ref 5–15)
BUN: 23 mg/dL (ref 8–23)
CO2: 28 mmol/L (ref 22–32)
CREATININE: 0.93 mg/dL (ref 0.61–1.24)
Calcium: 8.9 mg/dL (ref 8.9–10.3)
Chloride: 102 mmol/L (ref 98–111)
GFR calc Af Amer: >60 mL/min (ref >60)
GFR calc non Af Amer: >60 mL/min (ref >60)
Glucose, Bld: 93 mg/dL (ref 70–99)
Potassium: 4.4 mmol/L (ref 3.5–5.1)
Sodium: 138 mmol/L (ref 135–145)

## 2018-06-25 NOTE — Progress Notes (Signed)
Patient states he is eating and drinking well, five to six 20 ounce bottles of water daily.  He states he is feeling a little tired, but this is typical after chemo.  Dr. Rogue Bussing states patient can go without fluids today.

## 2018-06-26 ENCOUNTER — Other Ambulatory Visit: Payer: Self-pay | Admitting: Urology

## 2018-07-04 ENCOUNTER — Inpatient Hospital Stay: Payer: PPO

## 2018-07-04 ENCOUNTER — Encounter: Payer: Self-pay | Admitting: Internal Medicine

## 2018-07-04 ENCOUNTER — Other Ambulatory Visit: Payer: Self-pay

## 2018-07-04 ENCOUNTER — Inpatient Hospital Stay (HOSPITAL_BASED_OUTPATIENT_CLINIC_OR_DEPARTMENT_OTHER): Payer: PPO | Admitting: Internal Medicine

## 2018-07-04 VITALS — BP 150/92 | HR 88 | Temp 97.6°F | Resp 20 | Ht 67.0 in | Wt 212.0 lb

## 2018-07-04 DIAGNOSIS — C675 Malignant neoplasm of bladder neck: Secondary | ICD-10-CM

## 2018-07-04 DIAGNOSIS — Z87891 Personal history of nicotine dependence: Secondary | ICD-10-CM

## 2018-07-04 DIAGNOSIS — Z79899 Other long term (current) drug therapy: Secondary | ICD-10-CM | POA: Diagnosis not present

## 2018-07-04 DIAGNOSIS — I10 Essential (primary) hypertension: Secondary | ICD-10-CM | POA: Diagnosis not present

## 2018-07-04 DIAGNOSIS — Z5111 Encounter for antineoplastic chemotherapy: Secondary | ICD-10-CM | POA: Diagnosis not present

## 2018-07-04 DIAGNOSIS — N1330 Unspecified hydronephrosis: Secondary | ICD-10-CM | POA: Diagnosis not present

## 2018-07-04 LAB — CBC WITH DIFFERENTIAL/PLATELET
Abs Immature Granulocytes: 0.36 10*3/uL — ABNORMAL HIGH (ref 0.00–0.07)
Basophils Absolute: 0.1 10*3/uL (ref 0.0–0.1)
Basophils Relative: 1 %
EOS PCT: 2 %
Eosinophils Absolute: 0.1 10*3/uL (ref 0.0–0.5)
HCT: 36.4 % — ABNORMAL LOW (ref 39.0–52.0)
Hemoglobin: 11.7 g/dL — ABNORMAL LOW (ref 13.0–17.0)
Immature Granulocytes: 5 %
Lymphocytes Relative: 9 %
Lymphs Abs: 0.7 10*3/uL (ref 0.7–4.0)
MCH: 30.6 pg (ref 26.0–34.0)
MCHC: 32.1 g/dL (ref 30.0–36.0)
MCV: 95.3 fL (ref 80.0–100.0)
Monocytes Absolute: 0.5 10*3/uL (ref 0.1–1.0)
Monocytes Relative: 7 %
Neutro Abs: 5.5 10*3/uL (ref 1.7–7.7)
Neutrophils Relative %: 76 %
Platelets: 122 10*3/uL — ABNORMAL LOW (ref 150–400)
RBC: 3.82 MIL/uL — ABNORMAL LOW (ref 4.22–5.81)
RDW: 18 % — ABNORMAL HIGH (ref 11.5–15.5)
WBC: 7.2 10*3/uL (ref 4.0–10.5)
nRBC: 0 % (ref 0.0–0.2)

## 2018-07-04 LAB — COMPREHENSIVE METABOLIC PANEL
ALT: 23 U/L (ref 0–44)
AST: 22 U/L (ref 15–41)
Albumin: 3.8 g/dL (ref 3.5–5.0)
Alkaline Phosphatase: 79 U/L (ref 38–126)
Anion gap: 12 (ref 5–15)
BUN: 13 mg/dL (ref 8–23)
CO2: 28 mmol/L (ref 22–32)
Calcium: 9.4 mg/dL (ref 8.9–10.3)
Chloride: 103 mmol/L (ref 98–111)
Creatinine, Ser: 1.13 mg/dL (ref 0.61–1.24)
GFR calc Af Amer: >60 mL/min (ref >60)
Glucose, Bld: 209 mg/dL — ABNORMAL HIGH (ref 70–99)
Potassium: 4.6 mmol/L (ref 3.5–5.1)
Sodium: 143 mmol/L (ref 135–145)
Total Bilirubin: 0.5 mg/dL (ref 0.3–1.2)
Total Protein: 6.2 g/dL — ABNORMAL LOW (ref 6.5–8.1)

## 2018-07-04 NOTE — Progress Notes (Signed)
[facility] NOTE  Patient Care Team: Kirk Ruths, MD as PCP - General (Internal Medicine)  CHIEF COMPLAINTS/PURPOSE OF CONSULTATION:  Bladder cancer  #  Oncology History   # BLADDER CANCER: pT2/cpT4  prostate/bladder neck Dr.Brandon TURP s/p  - INVASIVE UROTHELIAL CARCINOMA, HIGH-GRADE;  TUMOR INVADES BLADDER NECK MUSCULARIS PROPRIA. Mild-mod Right hydronephrosis [HIGH RISK FEATURES]    # OCt [facility] [place]  # 2 Echo- 50% to 55%. [oct 4944]   # COPD/ not on O2; [quit 2009];    DIAGNOSIS: BLADDER CA  STAGE:  PT2/cT4 cN0    ;GOALS: cure  CURRENT/MOST RECENT THERAPY : MVAC dd      Cancer of bladder neck ([place])   04/24/2018 -  Chemotherapy    The patient had DOXOrubicin (ADRIAMYCIN) chemo injection 64 mg, 30 mg/m2 = 64 mg, Intravenous,  Once, 4 of 4 cycles Administration: 64 mg (04/26/2018), 64 mg (05/17/2018), 64 mg (05/31/2018), 64 mg (06/19/2018) palonosetron (ALOXI) injection 0.25 mg, 0.25 mg, Intravenous,  Once, 4 of 4 cycles Administration: 0.25 mg (04/26/2018), 0.25 mg (05/17/2018), 0.25 mg (05/31/2018), 0.25 mg (06/19/2018) pegfilgrastim (NEULASTA) injection 6 mg, 6 mg, Subcutaneous, Once, 4 of 4 cycles Administration: 6 mg (04/27/2018), 6 mg (05/18/2018), 6 mg (06/01/2018), 6 mg (06/20/2018) pegfilgrastim (NEULASTA ONPRO KIT) injection 6 mg, 6 mg, Subcutaneous, Once, 1 of 1 cycle Administration: 6 mg (04/26/2018) methotrexate (PF) 64.75 mg in sodium chloride 0.9 % 50 mL chemo infusion, 30 mg/m2 = 64.75 mg, Intravenous,  Once, 4 of 4 cycles Administration: 64.75 mg (04/25/2018), 64.75 mg (05/16/2018), 64.75 mg (05/30/2018), 64.75 mg (06/18/2018) CISplatin (PLATINOL) 151 mg in sodium chloride 0.9 % 500 mL chemo infusion, 70 mg/m2 = 151 mg, Intravenous,  Once, 4 of 4 cycles Administration: 151 mg (04/26/2018), 151 mg (05/17/2018), 151 mg (05/31/2018), 151 mg (06/19/2018) vinBLAStine (VELBAN) 6.5 mg in sodium chloride 0.9 % 50 mL chemo infusion, 3 mg/m2 =  6.5 mg, Intravenous, Once, 4 of 4 cycles Administration: 6.5 mg (04/26/2018), 6.5 mg (05/17/2018), 6.5 mg (05/31/2018), 6.5 mg (06/19/2018) fosaprepitant (EMEND) 150 mg, dexamethasone (DECADRON) 12 mg in sodium chloride 0.9 % 145 mL IVPB, , Intravenous,  Once, 4 of 4 cycles Administration:  (04/26/2018),  (05/17/2018),  (05/31/2018),  (06/19/2018)  for chemotherapy treatment.       HISTORY OF PRESENTING ILLNESS:  Steve Gilbert 67 y.o.  male with newly diagnosed muscle invasive bladder cancer currently on neoadjuvant chemotherapy-currently status post cycle #4 of dose dense MVAC chemotherapy approximately  2 weeks ago is here for follow-up.  In the interim patient was evaluated by urology-awaiting preoperative CT scan this week.   He denies any significant tingling or numbness.  Complains of rash in the left underarm.  Itchy.  He continues to complain of bilateral tearing in his eyes.  Denies any shortness of breath or cough.  No blood in urine.   Review of Systems  Constitutional: Negative for chills, diaphoresis, fever and weight loss.  HENT: Negative for nosebleeds and sore throat.   Eyes: Negative for double vision.  Cardiovascular: Negative for chest pain, palpitations, orthopnea and leg swelling.  Gastrointestinal: Negative for abdominal pain, blood in stool, constipation, diarrhea, heartburn, melena, nausea and vomiting.  Genitourinary: Negative for dysuria, frequency and urgency.  Musculoskeletal: Negative for back pain and joint pain.  Skin: Positive for itching and rash.  Neurological: Negative for dizziness, tingling, focal weakness, weakness and headaches.  Endo/Heme/Allergies: Does not bruise/bleed easily.  Psychiatric/Behavioral: Negative for depression. The patient is not nervous/anxious  and does not have insomnia.      MEDICAL HISTORY:  Past Medical History:  Diagnosis Date  . Acid reflux   . Allergy   . Cancer ([place])   . COPD (chronic obstructive pulmonary  disease) ([place])   . High cholesterol   . Hyperlipidemia 04/03/2018  . Hypertension   . Pneumonia     SURGICAL HISTORY: Past Surgical History:  Procedure Laterality Date  . CYSTOSCOPY WITH BIOPSY N/A 04/04/2018   Procedure: CYSTOSCOPY WITH TURBT;  Surgeon: Hollice Espy, MD;  Location: [facility] ORS;  Service: Urology;  Laterality: N/A;  . CYSTOSCOPY WITH URETEROSCOPY AND STENT PLACEMENT Right 04/04/2018   Procedure: CYSTOSCOPY WITH URETEROSCOPY AND STENT PLACEMENT;  Surgeon: Hollice Espy, MD;  Location: [facility] OR[facility];  Service: Urology;  Laterality: Right;  . NASAL SINUS SURGERY    . PORTA CATH INSERTION N/A 04/23/2018   Procedure: PORTA CATH INSERTION;  Surgeon: Algernon Huxley, MD;  Location: [facility];  Service: Cardiovascular;  Laterality: N/A;  . TRANSURETHRAL RESECTION OF PROSTATE N/A 04/04/2018   Procedure: TRANSURETHRAL RESECTION OF THE PROSTATE (TURP);  Surgeon: Hollice Espy, MD;  Location: [facility] OR[facility];  Service: Urology;  Laterality: N/A;  . URETERAL BIOPSY Right 04/04/2018   Procedure: URETERAL BIOPSY;  Surgeon: Hollice Espy, MD;  Location: [facility] OR[facility];  Service: Urology;  Laterality: Right;    SOCIAL HISTORY: pt lives with his wife; currently retired.  Works part-time.  Remote history of smoking.  No alcohol. Social History   Socioeconomic History  . Marital status: Married    Spouse name: Not on file  . Number of children: Not on file  . Years of education: Not on file  . Highest education level: Not on file  Occupational History  . Not on file  Social Needs  . Financial resource strain: Not on file  . Food insecurity:    Worry: Not on file    Inability: Not on file  . Transportation needs:    Medical: Not on file    Non-medical: Not on file  Tobacco Use  . Smoking status: Former Smoker    Packs/day: 1.50    Years: 30.00    Pack years: 45.00    Types: Cigarettes    Last attempt to quit: 2009    Years since quitting: 10.9  . Smokeless tobacco: Former Chief Strategy Officer and Sexual Activity  . Alcohol use: Yes    Alcohol/week: 7.0 standard drinks    Types: 5 Cans of beer, 2 Standard drinks or equivalent per week    Comment: ocassional   . Drug use: Not Currently  . Sexual activity: Not on file  Lifestyle  . Physical activity:    Days per week: Not on file    Minutes per session: Not on file  . Stress: Not on file  Relationships  . Social connections:    Talks on phone: Not on file    Gets together: Not on file    Attends religious service: Not on file    Active member of club or organization: Not on file    Attends meetings of clubs or organizations: Not on file    Relationship status: Not on file  . Intimate partner violence:    Fear of current or ex partner: Not on file    Emotionally abused: Not on file    Physically abused: Not on file    Forced sexual activity: Not on file  Other Topics Concern  . Not on file  Social History Narrative  . Not on file    FAMILY HISTORY: Family History  Problem Relation Age of Onset  . Diabetes Mother   . Heart disease Father   . Diabetes Brother   . Heart attack Brother   . Heart disease Brother     ALLERGIES:  is allergic to contrast media [iodinated diagnostic agents].  MEDICATIONS:  Current Outpatient Medications  Medication Sig Dispense Refill  . albuterol (PROVENTIL HFA;VENTOLIN HFA) 108 (90 Base) MCG/ACT inhaler Inhale 2 puffs into the lungs every 6 (six) hours as needed for wheezing or shortness of breath. 1 Inhaler 2  . amLODipine (NORVASC) 10 MG tablet Take 10 mg by mouth daily.    Marland Kitchen aspirin EC 81 MG tablet Take 81 mg by mouth daily.    Marland Kitchen atorvastatin (LIPITOR) 20 MG tablet Take 20 mg by mouth daily.    Marland Kitchen azelastine (ASTELIN) 0.1 % nasal spray Place into both nostrils 2 (two) times daily. Use in each nostril as directed    . docusate sodium (COLACE) 100 MG capsule TAKE 1 CAPSULE BY MOUTH TWICE A DAY 60 capsule 0  . Fluticasone-Salmeterol (ADVAIR) 250-50 MCG/DOSE AEPB Inhale  1 puff into the lungs 2 (two) times daily.    . hydrochlorothiazide (HYDRODIURIL) 12.5 MG tablet TAKE 1 TABLET PO QD  9  . lidocaine-prilocaine (EMLA) cream Apply 1 application topically as needed. 30 g 3  . metoprolol tartrate (LOPRESSOR) 25 MG tablet Take 1 tablet (25 mg total) by mouth 2 (two) times daily. 60 tablet 2  . Multiple Vitamins-Minerals (MULTIVITAMIN WITH MINERALS) tablet Take 1 tablet by mouth daily.    . pantoprazole (PROTONIX) 40 MG tablet Take 40 mg by mouth daily.    . Saw Palmetto 450 MG CAPS Take 450 mg by mouth daily.    . silodosin (RAPAFLO) 4 MG CAPS capsule Take 1 capsule (4 mg total) by mouth daily with breakfast. 90 capsule 2  . tamsulosin (FLOMAX) 0.4 MG CAPS capsule TAKE 1 CAPSULE BY MOUTH EVERY DAY 90 capsule 0  . tiotropium (SPIRIVA) 18 MCG inhalation capsule Place 18 mcg into inhaler and inhale daily.     . ondansetron (ZOFRAN) 8 MG tablet Take 1 tablet (8 mg total) by mouth every 8 (eight) hours as needed for nausea or vomiting. (Patient not taking: Reported on 07/04/2018) 20 tablet 3  . prochlorperazine (COMPAZINE) 10 MG tablet Take 1 tablet (10 mg total) by mouth every 6 (six) hours as needed for nausea or vomiting. (Patient not taking: Reported on 06/18/2018) 30 tablet 3   No current facility-administered medications for this visit.       Marland Kitchen  PHYSICAL EXAMINATION: ECOG PERFORMANCE STATUS: 1 - Symptomatic but completely ambulatory  Vitals:   07/04/18 1042  BP: (!) 150/92  Pulse: 88  Resp: 20  Temp: 97.6 F (36.4 C)   Filed Weights   07/04/18 1042  Weight: 212 lb (96.2 kg)    Physical Exam  Constitutional: He is oriented to person, place, and time and well-developed, well-nourished, and in no distress.  He is alone.  Walking himself.  HENT:  Head: Normocephalic and atraumatic.  Mouth/Throat: Oropharynx is clear and moist. No oropharyngeal exudate.  Eyes: Pupils are equal, round, and reactive to light.  Neck: Normal range of motion. Neck  supple.  Cardiovascular: Normal rate and regular rhythm.  Pulmonary/Chest: Breath sounds normal. No respiratory distress.  Abdominal: Soft. Bowel sounds are normal. He exhibits no distension and no mass. There is no abdominal tenderness.  There is no rebound and no guarding.  Musculoskeletal: Normal range of motion.        General: No tenderness or edema.  Neurological: He is alert and oriented to person, place, and time.  Skin: Skin is warm.  Left underarm erythematous rash.  Psychiatric: Affect normal.     LABORATORY DATA:  I have reviewed the data as listed Lab Results  Component Value Date   WBC 7.2 07/04/2018   HGB 11.7 (L) 07/04/2018   HCT 36.4 (L) 07/04/2018   MCV 95.3 07/04/2018   PLT 122 (L) 07/04/2018   Recent Labs    06/06/18 1308 06/18/18 0840 06/25/18 1126 07/04/18 0900  NA 136 140 138 143  K 4.5 3.8 4.4 4.6  CL 102 106 102 103  CO2 '27 27 28 28  ' GLUCOSE 131* 191* 93 209*  BUN '23 9 23 13  ' CREATININE 1.00 1.08 0.93 1.13  CALCIUM 9.1 8.6* 8.9 9.4  GFRNONAA >60 >60 >60 >60  GFRAA >60 >60 >60 >60  PROT 6.1* 6.2*  --  6.2*  ALBUMIN 3.7 3.7  --  3.8  AST 18 24  --  22  ALT 22 22  --  23  ALKPHOS 98 75  --  79  BILITOT 0.6 0.4  --  0.5    RADIOGRAPHIC STUDIES: I have personally reviewed the radiological images as listed and agreed with the findings in the report. No results found.  ASSESSMENT & PLAN:   Cancer of bladder neck ([place]) #Transitional cell bladder cancer pT2/ cT4 cN0- [question involving prostate]- stage II/III.  Currently s/p 4 cycles of dose dense MVAC.   #Stable.  Reviewed the urology evaluation.  Awaiting on CT scan end of this week [?  [facility]]; with surgery plan on January 23 in [place].  # skin rash- left undersarm; topical anti-fungal   # Eye tearing-on topical anihistamine stable.  If it does not improve then recommend evaluation with ophthalmology-question lacrimal duct blockages.  #Acute renal insufficiency [#  Right-sided hydronephrosis-secondary tumor-status post stenting.]  Improved  # HTN- continue norvasc/ metoprolol.  Stable  # DISPOSITION: # follow up in 3rd week of Feb 2020;port flush/labs- cbc/bmp-Dr.B  All questions were answered. The patient knows to call the clinic with any problems, questions or concerns.     Cammie Sickle, MD 07/04/2018 1:24 PM

## 2018-07-04 NOTE — Assessment & Plan Note (Addendum)
#  Transitional cell bladder cancer pT2/ cT4 cN0- [question involving prostate]- stage II/III.  Currently s/p 4 cycles of dose dense MVAC.   #Stable.  Reviewed the urology evaluation.  Awaiting on CT scan end of this week [?  [facility]]; with surgery plan on January 23 in [place].  # skin rash- left undersarm; topical anti-fungal   # Eye tearing-on topical anihistamine stable.  If it does not improve then recommend evaluation with ophthalmology-question lacrimal duct blockages.  #Acute renal insufficiency [# Right-sided hydronephrosis-secondary tumor-status post stenting.]  Improved  # HTN- continue norvasc/ metoprolol.  Stable  # DISPOSITION: # follow up in 3rd week of Feb 2020;port flush/labs- cbc/bmp-Dr.B

## 2018-07-06 DIAGNOSIS — C679 Malignant neoplasm of bladder, unspecified: Secondary | ICD-10-CM | POA: Diagnosis not present

## 2018-07-06 DIAGNOSIS — C678 Malignant neoplasm of overlapping sites of bladder: Secondary | ICD-10-CM | POA: Diagnosis not present

## 2018-07-08 DIAGNOSIS — E7800 Pure hypercholesterolemia, unspecified: Secondary | ICD-10-CM | POA: Diagnosis not present

## 2018-07-08 DIAGNOSIS — I10 Essential (primary) hypertension: Secondary | ICD-10-CM | POA: Diagnosis not present

## 2018-07-08 DIAGNOSIS — L253 Unspecified contact dermatitis due to other chemical products: Secondary | ICD-10-CM | POA: Diagnosis not present

## 2018-07-09 DIAGNOSIS — R21 Rash and other nonspecific skin eruption: Secondary | ICD-10-CM | POA: Diagnosis not present

## 2018-07-18 HISTORY — PX: OTHER SURGICAL HISTORY: SHX169

## 2018-07-31 DIAGNOSIS — C678 Malignant neoplasm of overlapping sites of bladder: Secondary | ICD-10-CM | POA: Diagnosis not present

## 2018-07-31 DIAGNOSIS — N130 Hydronephrosis with ureteropelvic junction obstruction: Secondary | ICD-10-CM | POA: Diagnosis not present

## 2018-08-09 ENCOUNTER — Other Ambulatory Visit: Payer: Self-pay

## 2018-08-09 ENCOUNTER — Inpatient Hospital Stay (HOSPITAL_COMMUNITY)
Admission: RE | Admit: 2018-08-09 | Discharge: 2018-08-16 | DRG: 654 | Disposition: A | Discharge status: Home / Self Care | Payer: PPO | Attending: Urology | Admitting: Urology

## 2018-08-09 ENCOUNTER — Encounter (HOSPITAL_COMMUNITY): Payer: Self-pay | Admitting: *Deleted

## 2018-08-09 DIAGNOSIS — J449 Chronic obstructive pulmonary disease, unspecified: Secondary | ICD-10-CM | POA: Diagnosis not present

## 2018-08-09 DIAGNOSIS — N1330 Unspecified hydronephrosis: Secondary | ICD-10-CM | POA: Diagnosis not present

## 2018-08-09 DIAGNOSIS — K567 Ileus, unspecified: Secondary | ICD-10-CM | POA: Diagnosis not present

## 2018-08-09 DIAGNOSIS — C678 Malignant neoplasm of overlapping sites of bladder: Secondary | ICD-10-CM | POA: Diagnosis not present

## 2018-08-09 DIAGNOSIS — I10 Essential (primary) hypertension: Secondary | ICD-10-CM | POA: Diagnosis present

## 2018-08-09 DIAGNOSIS — K4090 Unilateral inguinal hernia, without obstruction or gangrene, not specified as recurrent: Secondary | ICD-10-CM | POA: Diagnosis not present

## 2018-08-09 DIAGNOSIS — Z9079 Acquired absence of other genital organ(s): Secondary | ICD-10-CM

## 2018-08-09 DIAGNOSIS — Z6833 Body mass index (BMI) 33.0-33.9, adult: Secondary | ICD-10-CM | POA: Diagnosis not present

## 2018-08-09 DIAGNOSIS — C672 Malignant neoplasm of lateral wall of bladder: Secondary | ICD-10-CM | POA: Diagnosis not present

## 2018-08-09 DIAGNOSIS — Z8249 Family history of ischemic heart disease and other diseases of the circulatory system: Secondary | ICD-10-CM

## 2018-08-09 DIAGNOSIS — Z87891 Personal history of nicotine dependence: Secondary | ICD-10-CM

## 2018-08-09 DIAGNOSIS — Z7982 Long term (current) use of aspirin: Secondary | ICD-10-CM

## 2018-08-09 DIAGNOSIS — E669 Obesity, unspecified: Secondary | ICD-10-CM | POA: Diagnosis not present

## 2018-08-09 DIAGNOSIS — Z833 Family history of diabetes mellitus: Secondary | ICD-10-CM | POA: Diagnosis not present

## 2018-08-09 DIAGNOSIS — E785 Hyperlipidemia, unspecified: Secondary | ICD-10-CM | POA: Diagnosis present

## 2018-08-09 DIAGNOSIS — K219 Gastro-esophageal reflux disease without esophagitis: Secondary | ICD-10-CM | POA: Diagnosis present

## 2018-08-09 DIAGNOSIS — Z91041 Radiographic dye allergy status: Secondary | ICD-10-CM

## 2018-08-09 DIAGNOSIS — K429 Umbilical hernia without obstruction or gangrene: Secondary | ICD-10-CM | POA: Diagnosis not present

## 2018-08-09 DIAGNOSIS — Z79899 Other long term (current) drug therapy: Secondary | ICD-10-CM

## 2018-08-09 DIAGNOSIS — C670 Malignant neoplasm of trigone of bladder: Secondary | ICD-10-CM | POA: Diagnosis not present

## 2018-08-09 DIAGNOSIS — K4091 Unilateral inguinal hernia, without obstruction or gangrene, recurrent: Secondary | ICD-10-CM | POA: Diagnosis not present

## 2018-08-09 DIAGNOSIS — C676 Malignant neoplasm of ureteric orifice: Secondary | ICD-10-CM | POA: Diagnosis not present

## 2018-08-09 DIAGNOSIS — C775 Secondary and unspecified malignant neoplasm of intrapelvic lymph nodes: Secondary | ICD-10-CM | POA: Diagnosis not present

## 2018-08-09 DIAGNOSIS — C679 Malignant neoplasm of bladder, unspecified: Principal | ICD-10-CM | POA: Diagnosis present

## 2018-08-09 LAB — COMPREHENSIVE METABOLIC PANEL
ALT: 26 U/L (ref 0–44)
AST: 23 U/L (ref 15–41)
Albumin: 4.2 g/dL (ref 3.5–5.0)
Alkaline Phosphatase: 63 U/L (ref 38–126)
Anion gap: 7 (ref 5–15)
BUN: 20 mg/dL (ref 8–23)
CO2: 29 mmol/L (ref 22–32)
Calcium: 9.3 mg/dL (ref 8.9–10.3)
Chloride: 103 mmol/L (ref 98–111)
Creatinine, Ser: 1.19 mg/dL (ref 0.61–1.24)
GFR calc Af Amer: >60 mL/min (ref >60)
GFR calc non Af Amer: >60 mL/min (ref >60)
Glucose, Bld: 124 mg/dL — ABNORMAL HIGH (ref 70–99)
POTASSIUM: 4 mmol/L (ref 3.5–5.1)
Sodium: 139 mmol/L (ref 135–145)
Total Bilirubin: 1.3 mg/dL — ABNORMAL HIGH (ref 0.3–1.2)
Total Protein: 6.6 g/dL (ref 6.5–8.1)

## 2018-08-09 LAB — MRSA PCR SCREENING: MRSA by PCR: NEGATIVE

## 2018-08-09 LAB — PREPARE RBC (CROSSMATCH)

## 2018-08-09 MED ORDER — ATORVASTATIN CALCIUM 20 MG PO TABS
20.0000 mg | ORAL_TABLET | Freq: Every day | ORAL | Status: DC
  Administered 2018-08-11 – 2018-08-16 (×6): 20 mg via ORAL
  Filled 2018-08-09: qty 1
  Filled 2018-08-09: qty 2
  Filled 2018-08-09 (×4): qty 1

## 2018-08-09 MED ORDER — PIPERACILLIN-TAZOBACTAM 3.375 G IVPB 30 MIN
3.3750 g | INTRAVENOUS | Status: AC
  Administered 2018-08-10: 3.375 g via INTRAVENOUS
  Filled 2018-08-09: qty 50

## 2018-08-09 MED ORDER — TIOTROPIUM BROMIDE MONOHYDRATE 18 MCG IN CAPS: 18.0000 ug | ORAL_CAPSULE | Freq: Every day | RESPIRATORY_TRACT | Status: DC | PRN

## 2018-08-09 MED ORDER — HYDROCHLOROTHIAZIDE 25 MG PO TABS
12.5000 mg | ORAL_TABLET | Freq: Every day | ORAL | Status: DC
  Administered 2018-08-11 – 2018-08-16 (×6): 12.5 mg via ORAL
  Filled 2018-08-09 (×6): qty 1

## 2018-08-09 MED ORDER — SODIUM CHLORIDE 0.9% IV SOLUTION: Freq: Once | INTRAVENOUS | Status: DC

## 2018-08-09 MED ORDER — SODIUM CHLORIDE 0.9 % IV SOLN
INTRAVENOUS | Status: DC
  Administered 2018-08-09 – 2018-08-10 (×2): via INTRAVENOUS

## 2018-08-09 MED ORDER — PIPERACILLIN-TAZOBACTAM 3.375 G IVPB 30 MIN: 3.3750 g | INTRAVENOUS | Status: DC

## 2018-08-09 MED ORDER — AMLODIPINE BESYLATE 10 MG PO TABS
10.0000 mg | ORAL_TABLET | Freq: Every day | ORAL | Status: DC
  Administered 2018-08-09 – 2018-08-16 (×7): 10 mg via ORAL
  Filled 2018-08-09 (×7): qty 1

## 2018-08-09 MED ORDER — ALVIMOPAN 12 MG PO CAPS
12.0000 mg | ORAL_CAPSULE | ORAL | Status: AC
  Administered 2018-08-10: 12 mg via ORAL
  Filled 2018-08-09: qty 1

## 2018-08-09 MED ORDER — PEG 3350-KCL-NA BICARB-NACL 420 G PO SOLR
4000.0000 mL | Freq: Once | ORAL | Status: AC
  Administered 2018-08-09: 4000 mL via ORAL

## 2018-08-09 MED ORDER — PANTOPRAZOLE SODIUM 40 MG PO TBEC
40.0000 mg | DELAYED_RELEASE_TABLET | Freq: Every evening | ORAL | Status: DC
  Administered 2018-08-09 – 2018-08-15 (×7): 40 mg via ORAL
  Filled 2018-08-09 (×7): qty 1

## 2018-08-09 MED ORDER — METOPROLOL TARTRATE 25 MG PO TABS
25.0000 mg | ORAL_TABLET | Freq: Two times a day (BID) | ORAL | Status: DC
  Administered 2018-08-10: 25 mg via ORAL
  Filled 2018-08-09 (×2): qty 1

## 2018-08-09 MED ORDER — MOMETASONE FURO-FORMOTEROL FUM 200-5 MCG/ACT IN AERO
2.0000 | INHALATION_SPRAY | Freq: Two times a day (BID) | RESPIRATORY_TRACT | Status: DC
  Administered 2018-08-11 – 2018-08-16 (×11): 2 via RESPIRATORY_TRACT
  Filled 2018-08-09 (×2): qty 8.8

## 2018-08-09 MED ORDER — UMECLIDINIUM BROMIDE 62.5 MCG/INH IN AEPB
1.0000 | INHALATION_SPRAY | Freq: Every day | RESPIRATORY_TRACT | Status: DC
  Filled 2018-08-09 (×2): qty 7

## 2018-08-09 NOTE — Progress Notes (Signed)
When giving patient 2200 dose of metoprolol 25mg , he stated "I think I already had all of my medications for the day." When reviewing meds with patient, he stated he usually takes "2 blood pressure pills in the morning and a cholesterol and blood pressure pill at night." Patient is unsure of names of meds he takes, but in PTA med list, his norvasc is once daily and metoprolol is twice daily. He took his meds at home this morning, and received a dose of norvasc this evening per the [facility]. Tommas Olp PA paged and made aware and she gave a verbal order to hold 2200 dose of Metoprolol. Patient informed and ok with plan. Will continue with plan of care.

## 2018-08-09 NOTE — H&P (Signed)
Steve Gilbert is an 68 y.o. male.    Chief Complaint: Pre-Op Cystoprostatectomy  HPI:   1 - High Grade Muscle Invasive BLadder Cancer with Right Malignant Hydronephrosis - s/p TURBT / Rt ureteroscopy / Rt stent 03/2018 for invasive bladder cancer by Dr. Erlene Quan in [place]. CT chest/abd/pelvis clinically localized, clinically stage 3 due tomalig hydro. Received 4 cycles dose-dense MVAC by Dr. Rogue Bussing at [facility] cancer center.   Restaging imaging 06/2018 with stable disease / not overtly metastatic.   PMH sig for COPD/inhailers (only PRN, not limiting, no chronic O2), sinus surgery, obesity. NO ischemic CV disease / blood thinners. 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 His PCP is Frazier Richards MD.   Today " Steve Gilbert " is seen as pre-op admission for stomal marking, labs, bowel prep prior to cystectomy tomorrow. No interval fevers. Interval restaging imaging favorable.     Past Medical History:  Diagnosis Date  . Acid reflux   . Allergy   . Cancer ([place])   . COPD (chronic obstructive pulmonary disease) ([place])   . High cholesterol   . Hyperlipidemia 04/03/2018  . Hypertension   . Pneumonia     Past Surgical History:  Procedure Laterality Date  . CYSTOSCOPY WITH BIOPSY N/A 04/04/2018   Procedure: CYSTOSCOPY WITH TURBT;  Surgeon: Hollice Espy, MD;  Location: [facility];  Service: Urology;  Laterality: N/A;  . CYSTOSCOPY WITH URETEROSCOPY AND STENT PLACEMENT Right 04/04/2018   Procedure: CYSTOSCOPY WITH URETEROSCOPY AND STENT PLACEMENT;  Surgeon: Hollice Espy, MD;  Location: [facility];  Service: Urology;  Laterality: Right;  . NASAL SINUS SURGERY    . PORTA CATH INSERTION N/A 04/23/2018   Procedure: PORTA CATH INSERTION;  Surgeon: Algernon Huxley, MD;  Location: [place];  Service: Cardiovascular;  Laterality: N/A;  . TRANSURETHRAL RESECTION OF PROSTATE N/A 04/04/2018   Procedure: TRANSURETHRAL RESECTION OF THE  PROSTATE (TURP);  Surgeon: Hollice Espy, MD;  Location: [facility];  Service: Urology;  Laterality: N/A;  . URETERAL BIOPSY Right 04/04/2018   Procedure: URETERAL BIOPSY;  Surgeon: Hollice Espy, MD;  Location: [facility];  Service: Urology;  Laterality: Right;    Family History  Problem Relation Age of Onset  . Diabetes Mother   . Heart disease Father   . Diabetes Brother   . Heart attack Brother   . Heart disease Brother    Social History:  reports that he quit smoking about 11 years ago. His smoking use included cigarettes. He has a 45.00 pack-year smoking history. He has quit using smokeless tobacco. He reports current alcohol use of about 7.0 standard drinks of alcohol per week. He reports previous drug use.  Allergies:  Allergies  Allergen Reactions  . Contrast Media [Iodinated Diagnostic Agents] Itching and Rash    Medications Prior to Admission  Medication Sig Dispense Refill  . amLODipine (NORVASC) 10 MG tablet Take 10 mg by mouth daily.    Marland Kitchen aspirin EC 81 MG tablet Take 81 mg by mouth daily.    Marland Kitchen atorvastatin (LIPITOR) 20 MG tablet Take 20 mg by mouth daily.    Marland Kitchen azelastine (ASTELIN) 0.1 % nasal spray Place 1 spray into both nostrils 2 (two) times daily as needed (congestion). Use in each nostril as directed     . diphenhydrAMINE (BENADRYL) 25 MG tablet Take 25 mg by mouth daily as needed for allergies.    . Fluticasone-Salmeterol (ADVAIR) 250-50 MCG/DOSE AEPB Inhale 1  puff into the lungs 2 (two) times daily as needed (shortness of breath).     . hydrochlorothiazide (HYDRODIURIL) 12.5 MG tablet Take 12.5 mg by mouth daily.   9  . lidocaine-prilocaine (EMLA) cream Apply 1 application topically as needed. (Patient taking differently: Apply 1 application topically as needed (port access). ) 30 g 3  . metoprolol tartrate (LOPRESSOR) 25 MG tablet Take 1 tablet (25 mg total) by mouth 2 (two) times daily. 60 tablet 2  . pantoprazole (PROTONIX) 40 MG tablet Take 40 mg by mouth every  evening.     . tamsulosin (FLOMAX) 0.4 MG CAPS capsule TAKE 1 CAPSULE BY MOUTH EVERY DAY 90 capsule 0  . tiotropium (SPIRIVA) 18 MCG inhalation capsule Place 18 mcg into inhaler and inhale daily as needed (congestion).     Marland Kitchen albuterol (PROVENTIL HFA;VENTOLIN HFA) 108 (90 Base) MCG/ACT inhaler Inhale 2 puffs into the lungs every 6 (six) hours as needed for wheezing or shortness of breath. (Patient not taking: Reported on 08/06/2018) 1 Inhaler 2  . docusate sodium (COLACE) 100 MG capsule TAKE 1 CAPSULE BY MOUTH TWICE A DAY (Patient not taking: Reported on 08/06/2018) 60 capsule 0  . Multiple Vitamins-Minerals (MULTIVITAMIN WITH MINERALS) tablet Take 1 tablet by mouth daily.    . ondansetron (ZOFRAN) 8 MG tablet Take 1 tablet (8 mg total) by mouth every 8 (eight) hours as needed for nausea or vomiting. (Patient not taking: Reported on 07/04/2018) 20 tablet 3  . prochlorperazine (COMPAZINE) 10 MG tablet Take 1 tablet (10 mg total) by mouth every 6 (six) hours as needed for nausea or vomiting. (Patient not taking: Reported on 06/18/2018) 30 tablet 3  . Saw Palmetto 450 MG CAPS Take 450 mg by mouth daily.    . silodosin (RAPAFLO) 4 MG CAPS capsule Take 1 capsule (4 mg total) by mouth daily with breakfast. (Patient not taking: Reported on 08/06/2018) 90 capsule 2    Results for orders placed or performed during the hospital encounter of 08/09/18 (from the past 48 hour(s))  Comprehensive metabolic panel     Status: Abnormal   Collection Time: 08/09/18  2:01 PM  Result Value Ref Range   Sodium 139 135 - 145 mmol/L   Potassium 4.0 3.5 - 5.1 mmol/L   Chloride 103 98 - 111 mmol/L   CO2 29 22 - 32 mmol/L   Glucose, Bld 124 (H) 70 - 99 mg/dL   BUN 20 8 - 23 mg/dL   Creatinine, Ser 1.19 0.61 - 1.24 mg/dL   Calcium 9.3 8.9 - 10.3 mg/dL   Total Protein 6.6 6.5 - 8.1 g/dL   Albumin 4.2 3.5 - 5.0 g/dL   AST 23 15 - 41 U/L   ALT 26 0 - 44 U/L   Alkaline Phosphatase 63 38 - 126 U/L   Total Bilirubin 1.3 (H) 0.3  - 1.2 mg/dL   GFR calc non Af Amer >60 >60 mL/min   GFR calc Af Amer >60 >60 mL/min   Anion gap 7 5 - 15    Comment: Performed at [facility], [place] [address] [place], [place] [address]  Prepare RBC     Status: None   Collection Time: 08/09/18  4:21 PM  Result Value Ref Range   Order Confirmation      ORDER PROCESSED BY BLOOD BANK Performed at [facility] [address], [place], [place] [address]   ABO/Rh     Status: None (Preliminary result)   Collection Time:  08/09/18  5:05 PM  Result Value Ref Range   ABO/RH(D)      B POS Performed at [facility], [place] [address] [place], [place] [address]   Type and screen [place]     Status: None   Collection Time: 08/09/18  5:07 PM  Result Value Ref Range   ABO/RH(D) B POS    Antibody Screen NEG    Sample Expiration      08/12/2018 Performed at [facility], [place] [address], [place], [place] [address]   MRSA PCR Screening     Status: None   Collection Time: 08/09/18  8:25 PM  Result Value Ref Range   MRSA by PCR NEGATIVE NEGATIVE    Comment:        The GeneXpert MRSA Assay (FDA approved for NASAL specimens only), is one component of a comprehensive MRSA colonization surveillance program. It is not intended to diagnose MRSA infection nor to guide or monitor treatment for MRSA infections. Performed at [facility], [place] [address] [place], [place] [address]    No results found.  Review of Systems  Constitutional: Negative.   HENT: Negative.   Eyes: Negative.   Respiratory: Negative.   Cardiovascular: Negative.   Gastrointestinal: Negative.   Genitourinary: Positive for urgency.  Musculoskeletal: Negative.   Skin: Negative.   Neurological: Negative.   Endo/Heme/Allergies: Negative.   Psychiatric/Behavioral: Negative.     Blood pressure 123/89, pulse 89, temperature 98.3 F (36.8 C), temperature  source Oral, resp. rate 20, height 5\' 7"  (1.702 m), weight 97.5 kg, SpO2 96 %. Physical Exam  Constitutional: He appears well-developed.  HENT:  Head: Normocephalic.  Eyes: Pupils are equal, round, and reactive to light.  Neck: Normal range of motion.  Cardiovascular: Normal rate.  Respiratory: Effort normal.  GI: Soft.  Stomal marking site note Rt uperior to umbilicus.   Genitourinary:    Genitourinary Comments: No CVAT at present.   Musculoskeletal: Normal range of motion.  Neurological: He is alert.  Skin: Skin is warm.  Psychiatric: He has a normal mood and affect.     Assessment/Plan Bowel prep, CMP, CBC, stomal marking, T+C 2 units I preparation for curative intent cystoprostatectomy with conduit diversion tomorrow. Risks, benefits, alternatives, expected peri-op course discussed previously and reiterated today.      Alexis Frock, MD 08/09/2018, 11:07 PM

## 2018-08-09 NOTE — Anesthesia Preprocedure Evaluation (Addendum)
Anesthesia Evaluation  Patient identified by MRN, date of birth, ID band Patient awake    Reviewed: Allergy & Precautions, NPO status , Patient's Chart, lab work & pertinent test results  History of Anesthesia Complications Negative for: history of anesthetic complications  Airway Mallampati: III  TM Distance: >3 FB Neck ROM: Full    Dental  (+) Dental Advisory Given   Pulmonary neg sleep apnea, pneumonia, COPD,  COPD inhaler, former smoker,    Pulmonary exam normal breath sounds clear to auscultation       Cardiovascular hypertension, Pt. on medications (-) Past MI and (-) CHF Normal cardiovascular exam(-) dysrhythmias (-) Valvular Problems/Murmurs Rhythm:Regular Rate:Normal     Neuro/Psych neg Seizures    GI/Hepatic Neg liver ROS, GERD  Medicated and Controlled,  Endo/Other  neg diabetes  Renal/GU negative Renal ROS     Musculoskeletal   Abdominal   Peds  Hematology   Anesthesia Other Findings   Reproductive/Obstetrics                            Anesthesia Physical  Anesthesia Plan  ASA: III  Anesthesia Plan: General   Post-op Pain Management:    Induction: Intravenous  PONV Risk Score and Plan: 2 and Dexamethasone  Airway Management Planned: Oral ETT  Additional Equipment: Arterial line  Intra-op Plan:   Post-operative Plan: Possible Post-op intubation/ventilation  Informed Consent: I have reviewed the patients History and Physical, chart, labs and discussed the procedure including the risks, benefits and alternatives for the proposed anesthesia with the patient or authorized representative who has indicated his/her understanding and acceptance.     Dental advisory given  Plan Discussed with: CRNA  Anesthesia Plan Comments:         Anesthesia Quick Evaluation

## 2018-08-09 NOTE — Consult Note (Signed)
[place] Nurse requested for preoperative stoma site marking  Discussed surgical procedure and stoma creation with patient and family.  Explained role of the [place] nurse team.  Provided the patient with educational booklet and provided samples of pouching options.  Answered patient and family questions.   Examined patient lying, sitting, and standing in order to place the marking in the patient's visual field, away from any creases or abdominal contour issues and within the rectus muscle.   Marked for ileal conduit in the RLQ _5___  cm to the right of the umbilicus and  __8__ cm above the umbilicus.   Patient's abdomen cleansed with CHG wipes at site markings, allowed to air dry prior to marking.Covered mark with thin film transparent dressing to preserve mark until date of surgery.   [place] Nurse team will follow up with patient after surgery for continue ostomy care and teaching.  G[place] MSN, [place], [place], [place]

## 2018-08-10 ENCOUNTER — Inpatient Hospital Stay (HOSPITAL_COMMUNITY): Payer: PPO | Admitting: Anesthesiology

## 2018-08-10 ENCOUNTER — Encounter (HOSPITAL_COMMUNITY): Payer: Self-pay | Admitting: *Deleted

## 2018-08-10 ENCOUNTER — Encounter (HOSPITAL_COMMUNITY)
Admission: RE | Disposition: A | Discharge status: Home / Self Care | Payer: Self-pay | Source: Home / Self Care | Attending: Urology

## 2018-08-10 DIAGNOSIS — C679 Malignant neoplasm of bladder, unspecified: Secondary | ICD-10-CM | POA: Diagnosis present

## 2018-08-10 HISTORY — PX: CYSTOSCOPY W/ URETERAL STENT REMOVAL: SHX1430

## 2018-08-10 LAB — CBC
HCT: 44 % (ref 39.0–52.0)
HEMOGLOBIN: 13.9 g/dL (ref 13.0–17.0)
MCH: 32.6 pg (ref 26.0–34.0)
MCHC: 31.6 g/dL (ref 30.0–36.0)
MCV: 103 fL — ABNORMAL HIGH (ref 80.0–100.0)
Platelets: 204 10*3/uL (ref 150–400)
RBC: 4.27 MIL/uL (ref 4.22–5.81)
RDW: 13.5 % (ref 11.5–15.5)
WBC: 3.9 10*3/uL — ABNORMAL LOW (ref 4.0–10.5)
nRBC: 0 % (ref 0.0–0.2)

## 2018-08-10 LAB — HEMOGLOBIN AND HEMATOCRIT, BLOOD
HCT: 35.3 % — ABNORMAL LOW (ref 39.0–52.0)
Hemoglobin: 11.7 g/dL — ABNORMAL LOW (ref 13.0–17.0)

## 2018-08-10 LAB — ABO/RH: ABO/RH(D): B POS

## 2018-08-10 SURGERY — ROBOT ASSISTED LAPAROSCOPIC RADICAL CYSTOPROSTATECTOMY BILATERAL PELVIC LYMPHADENECTOMY,ORTHOTOPIC NEOBLADDER
Anesthesia: General | Laterality: Right

## 2018-08-10 MED ORDER — FENTANYL CITRATE (PF) 100 MCG/2ML IJ SOLN
25.0000 ug | INTRAMUSCULAR | Status: DC | PRN
  Administered 2018-08-10: 50 ug via INTRAVENOUS

## 2018-08-10 MED ORDER — MIDAZOLAM HCL 2 MG/2ML IJ SOLN
1.0000 mg | Freq: Once | INTRAMUSCULAR | Status: AC | PRN
  Administered 2018-08-10: 1 mg via INTRAVENOUS

## 2018-08-10 MED ORDER — METOPROLOL TARTRATE 5 MG/5ML IV SOLN
INTRAVENOUS | Status: AC
  Filled 2018-08-10: qty 5

## 2018-08-10 MED ORDER — HYDRALAZINE HCL 20 MG/ML IJ SOLN
10.0000 mg | Freq: Four times a day (QID) | INTRAMUSCULAR | Status: DC | PRN
  Administered 2018-08-11: 10 mg via INTRAVENOUS
  Filled 2018-08-10: qty 1

## 2018-08-10 MED ORDER — MIDAZOLAM HCL 2 MG/2ML IJ SOLN
INTRAMUSCULAR | Status: AC
  Administered 2018-08-10: 1 mg via INTRAVENOUS
  Filled 2018-08-10: qty 2

## 2018-08-10 MED ORDER — MEPERIDINE HCL 50 MG/ML IJ SOLN
6.2500 mg | INTRAMUSCULAR | Status: DC | PRN
  Administered 2018-08-10: 6.25 mg via INTRAVENOUS

## 2018-08-10 MED ORDER — FENTANYL CITRATE (PF) 100 MCG/2ML IJ SOLN
INTRAMUSCULAR | Status: AC
  Filled 2018-08-10: qty 2

## 2018-08-10 MED ORDER — PROMETHAZINE HCL 25 MG/ML IJ SOLN: 6.2500 mg | INTRAMUSCULAR | Status: DC | PRN

## 2018-08-10 MED ORDER — ALVIMOPAN 12 MG PO CAPS
12.0000 mg | ORAL_CAPSULE | Freq: Two times a day (BID) | ORAL | Status: DC
  Administered 2018-08-11 – 2018-08-13 (×6): 12 mg via ORAL
  Filled 2018-08-10 (×6): qty 1

## 2018-08-10 MED ORDER — DEXTROSE-NACL 5-0.45 % IV SOLN
INTRAVENOUS | Status: DC
  Administered 2018-08-10 – 2018-08-12 (×5): via INTRAVENOUS

## 2018-08-10 MED ORDER — PHENYLEPHRINE 40 MCG/ML (10ML) SYRINGE FOR IV PUSH (FOR BLOOD PRESSURE SUPPORT)
PREFILLED_SYRINGE | INTRAVENOUS | Status: DC | PRN
  Administered 2018-08-10: 40 ug via INTRAVENOUS

## 2018-08-10 MED ORDER — LIP MEDEX EX OINT
TOPICAL_OINTMENT | CUTANEOUS | Status: AC
  Administered 2018-08-10: 20:00:00
  Filled 2018-08-10: qty 7

## 2018-08-10 MED ORDER — MEPERIDINE HCL 50 MG/ML IJ SOLN
INTRAMUSCULAR | Status: AC
  Filled 2018-08-10: qty 1

## 2018-08-10 MED ORDER — LACTATED RINGERS IV SOLN
INTRAVENOUS | Status: DC
  Administered 2018-08-10 (×5): via INTRAVENOUS

## 2018-08-10 MED ORDER — ONDANSETRON HCL 4 MG/2ML IJ SOLN
INTRAMUSCULAR | Status: DC | PRN
  Administered 2018-08-10: 4 mg via INTRAVENOUS

## 2018-08-10 MED ORDER — DIPHENHYDRAMINE HCL 12.5 MG/5ML PO ELIX: 12.5000 mg | ORAL_SOLUTION | Freq: Four times a day (QID) | ORAL | Status: DC | PRN

## 2018-08-10 MED ORDER — MIDAZOLAM HCL 2 MG/2ML IJ SOLN
INTRAMUSCULAR | Status: AC
  Filled 2018-08-10: qty 2

## 2018-08-10 MED ORDER — DIPHENHYDRAMINE HCL 50 MG/ML IJ SOLN: 12.5000 mg | Freq: Four times a day (QID) | INTRAMUSCULAR | Status: DC | PRN

## 2018-08-10 MED ORDER — ALBUMIN HUMAN 5 % IV SOLN
INTRAVENOUS | Status: AC
  Filled 2018-08-10: qty 250

## 2018-08-10 MED ORDER — LACTATED RINGERS IV SOLN
INTRAVENOUS | Status: DC
  Administered 2018-08-10: 07:00:00 via INTRAVENOUS

## 2018-08-10 MED ORDER — MIDAZOLAM HCL 5 MG/5ML IJ SOLN
INTRAMUSCULAR | Status: DC | PRN
  Administered 2018-08-10: 2 mg via INTRAVENOUS

## 2018-08-10 MED ORDER — HYDROMORPHONE HCL 1 MG/ML IJ SOLN
0.5000 mg | INTRAMUSCULAR | Status: DC | PRN
  Administered 2018-08-10: 1 mg via INTRAVENOUS
  Filled 2018-08-10: qty 1

## 2018-08-10 MED ORDER — SODIUM CHLORIDE (PF) 0.9 % IJ SOLN
INTRAMUSCULAR | Status: DC | PRN
  Administered 2018-08-10: 20 mL

## 2018-08-10 MED ORDER — BUPIVACAINE LIPOSOME 1.3 % IJ SUSP
20.0000 mL | Freq: Once | INTRAMUSCULAR | Status: AC
  Administered 2018-08-10: 20 mL
  Filled 2018-08-10: qty 20

## 2018-08-10 MED ORDER — ACETAMINOPHEN 10 MG/ML IV SOLN
INTRAVENOUS | Status: AC
  Filled 2018-08-10: qty 100

## 2018-08-10 MED ORDER — SUGAMMADEX SODIUM 200 MG/2ML IV SOLN
INTRAVENOUS | Status: DC | PRN
  Administered 2018-08-10: 200 mg via INTRAVENOUS

## 2018-08-10 MED ORDER — OXYCODONE HCL 5 MG PO TABS
5.0000 mg | ORAL_TABLET | ORAL | Status: DC | PRN
  Administered 2018-08-10 – 2018-08-11 (×4): 5 mg via ORAL
  Filled 2018-08-10 (×6): qty 1

## 2018-08-10 MED ORDER — WATER FOR IRRIGATION, STERILE IR SOLN
Status: DC | PRN
  Administered 2018-08-10: 1000 mL via INTRAVESICAL

## 2018-08-10 MED ORDER — ALBUMIN HUMAN 5 % IV SOLN
INTRAVENOUS | Status: DC | PRN
  Administered 2018-08-10 (×2): via INTRAVENOUS

## 2018-08-10 MED ORDER — ACETAMINOPHEN 10 MG/ML IV SOLN
1000.0000 mg | Freq: Four times a day (QID) | INTRAVENOUS | Status: AC
  Administered 2018-08-10 – 2018-08-11 (×4): 1000 mg via INTRAVENOUS
  Filled 2018-08-10 (×3): qty 100

## 2018-08-10 MED ORDER — STERILE WATER FOR IRRIGATION IR SOLN
Status: DC | PRN
  Administered 2018-08-10: 1000 mL

## 2018-08-10 MED ORDER — ROCURONIUM BROMIDE 100 MG/10ML IV SOLN
INTRAVENOUS | Status: AC
  Filled 2018-08-10: qty 1

## 2018-08-10 MED ORDER — HYDROMORPHONE HCL 1 MG/ML IJ SOLN
0.2500 mg | INTRAMUSCULAR | Status: DC | PRN
  Administered 2018-08-10 (×4): 0.5 mg via INTRAVENOUS

## 2018-08-10 MED ORDER — PROPOFOL 10 MG/ML IV BOLUS
INTRAVENOUS | Status: DC | PRN
  Administered 2018-08-10: 200 mg via INTRAVENOUS

## 2018-08-10 MED ORDER — ROCURONIUM BROMIDE 10 MG/ML (PF) SYRINGE
PREFILLED_SYRINGE | INTRAVENOUS | Status: DC | PRN
  Administered 2018-08-10: 5 mg via INTRAVENOUS
  Administered 2018-08-10: 20 mg via INTRAVENOUS
  Administered 2018-08-10 (×2): 10 mg via INTRAVENOUS
  Administered 2018-08-10: 80 mg via INTRAVENOUS
  Administered 2018-08-10 (×2): 20 mg via INTRAVENOUS
  Administered 2018-08-10: 10 mg via INTRAVENOUS

## 2018-08-10 MED ORDER — SUCCINYLCHOLINE CHLORIDE 200 MG/10ML IV SOSY
PREFILLED_SYRINGE | INTRAVENOUS | Status: AC
  Filled 2018-08-10: qty 10

## 2018-08-10 MED ORDER — FENTANYL CITRATE (PF) 250 MCG/5ML IJ SOLN
INTRAMUSCULAR | Status: AC
  Filled 2018-08-10: qty 5

## 2018-08-10 MED ORDER — SUGAMMADEX SODIUM 200 MG/2ML IV SOLN
INTRAVENOUS | Status: AC
  Filled 2018-08-10: qty 2

## 2018-08-10 MED ORDER — SODIUM CHLORIDE (PF) 0.9 % IJ SOLN
INTRAMUSCULAR | Status: AC
  Filled 2018-08-10: qty 20

## 2018-08-10 MED ORDER — PHENYLEPHRINE 40 MCG/ML (10ML) SYRINGE FOR IV PUSH (FOR BLOOD PRESSURE SUPPORT)
PREFILLED_SYRINGE | INTRAVENOUS | Status: AC
  Filled 2018-08-10: qty 10

## 2018-08-10 MED ORDER — LIDOCAINE 2% (20 MG/ML) 5 ML SYRINGE
INTRAMUSCULAR | Status: DC | PRN
  Administered 2018-08-10: 100 mg via INTRAVENOUS

## 2018-08-10 MED ORDER — LACTATED RINGERS IR SOLN
Status: DC | PRN
  Administered 2018-08-10: 1000 mL

## 2018-08-10 MED ORDER — BELLADONNA ALKALOIDS-OPIUM 16.2-60 MG RE SUPP: 1.0000 | Freq: Four times a day (QID) | RECTAL | Status: DC | PRN

## 2018-08-10 MED ORDER — LIDOCAINE 2% (20 MG/ML) 5 ML SYRINGE
INTRAMUSCULAR | Status: AC
  Filled 2018-08-10: qty 5

## 2018-08-10 MED ORDER — PROPOFOL 10 MG/ML IV BOLUS
INTRAVENOUS | Status: AC
  Filled 2018-08-10: qty 20

## 2018-08-10 MED ORDER — ONDANSETRON HCL 4 MG/2ML IJ SOLN
INTRAMUSCULAR | Status: AC
  Filled 2018-08-10: qty 2

## 2018-08-10 MED ORDER — DEXAMETHASONE SODIUM PHOSPHATE 10 MG/ML IJ SOLN
INTRAMUSCULAR | Status: AC
  Filled 2018-08-10: qty 1

## 2018-08-10 MED ORDER — FENTANYL CITRATE (PF) 100 MCG/2ML IJ SOLN
INTRAMUSCULAR | Status: AC
  Filled 2018-08-10: qty 4

## 2018-08-10 MED ORDER — HYDROMORPHONE HCL 1 MG/ML IJ SOLN
INTRAMUSCULAR | Status: AC
  Filled 2018-08-10: qty 2

## 2018-08-10 MED ORDER — FENTANYL CITRATE (PF) 100 MCG/2ML IJ SOLN
INTRAMUSCULAR | Status: DC | PRN
  Administered 2018-08-10 (×9): 50 ug via INTRAVENOUS

## 2018-08-10 MED ORDER — ORAL CARE MOUTH RINSE
15.0000 mL | Freq: Two times a day (BID) | OROMUCOSAL | Status: DC
  Administered 2018-08-10 – 2018-08-16 (×8): 15 mL via OROMUCOSAL

## 2018-08-10 MED ORDER — DEXAMETHASONE SODIUM PHOSPHATE 10 MG/ML IJ SOLN
INTRAMUSCULAR | Status: DC | PRN
  Administered 2018-08-10: 10 mg via INTRAVENOUS

## 2018-08-10 MED ORDER — ONDANSETRON HCL 4 MG/2ML IJ SOLN
4.0000 mg | INTRAMUSCULAR | Status: DC | PRN
  Administered 2018-08-11 (×2): 4 mg via INTRAVENOUS
  Filled 2018-08-10 (×2): qty 2

## 2018-08-10 MED ORDER — INDOCYANINE GREEN 25 MG IV SOLR
INTRAVENOUS | Status: DC | PRN
  Administered 2018-08-10: 2.5 mg

## 2018-08-10 MED ORDER — METOPROLOL TARTRATE 5 MG/5ML IV SOLN
INTRAVENOUS | Status: DC | PRN
  Administered 2018-08-10 (×3): 1 mg via INTRAVENOUS

## 2018-08-10 SURGICAL SUPPLY — 84 items
APPLICATOR COTTON TIP 6 STRL (MISCELLANEOUS) ×2 IMPLANT
APPLICATOR COTTON TIP 6IN STRL (MISCELLANEOUS) ×3
APPLICATOR SURGIFLO ENDO (HEMOSTASIS) IMPLANT
BAG LAPAROSCOPIC 12 15 PORT 16 (BASKET) ×2 IMPLANT
BAG RETRIEVAL 12/15 (BASKET) ×3
BLADE HEX COATED 2.75 (ELECTRODE) ×3 IMPLANT
BLADE SURG SZ10 CARB STEEL (BLADE) IMPLANT
CATH FOLEY 2WAY SLVR 18FR 30CC (CATHETERS) ×3 IMPLANT
CELLS DAT CNTRL 66122 CELL SVR (MISCELLANEOUS) ×2 IMPLANT
CHLORAPREP W/TINT 26ML (MISCELLANEOUS) ×3 IMPLANT
CLIP VESOLOCK LG 6/CT PURPLE (CLIP) ×6 IMPLANT
CLIP VESOLOCK MED LG 6/CT (CLIP) ×3 IMPLANT
CLIP VESOLOCK XL 6/CT (CLIP) ×9 IMPLANT
COVER SURGICAL LIGHT HANDLE (MISCELLANEOUS) ×3 IMPLANT
COVER TIP SHEARS 8 DVNC (MISCELLANEOUS) ×4 IMPLANT
COVER TIP SHEARS 8MM DA VINCI (MISCELLANEOUS) ×2
COVER WAND RF STERILE (DRAPES) ×3 IMPLANT
DECANTER SPIKE VIAL GLASS SM (MISCELLANEOUS) ×3 IMPLANT
DERMABOND ADVANCED (GAUZE/BANDAGES/DRESSINGS) ×1
DERMABOND ADVANCED .7 DNX12 (GAUZE/BANDAGES/DRESSINGS) ×2 IMPLANT
DRAIN CHANNEL RND F F (WOUND CARE) ×3 IMPLANT
DRAIN PENROSE 18X1/2 LTX STRL (DRAIN) IMPLANT
DRAPE ARM DVNC X/XI (DISPOSABLE) ×8 IMPLANT
DRAPE COLUMN DVNC XI (DISPOSABLE) ×2 IMPLANT
DRAPE DA VINCI XI ARM (DISPOSABLE) ×4
DRAPE DA VINCI XI COLUMN (DISPOSABLE) ×1
ELECT REM PT RETURN 15FT ADLT (MISCELLANEOUS) ×3 IMPLANT
EVACUATOR SILICONE 100CC (DRAIN) ×3 IMPLANT
GLOVE BIO SURGEON STRL SZ 6.5 (GLOVE) ×6 IMPLANT
GLOVE BIOGEL M STRL SZ7.5 (GLOVE) ×9 IMPLANT
GOWN STRL REUS W/TWL LRG LVL3 (GOWN DISPOSABLE) ×15 IMPLANT
IRRIG SUCT STRYKERFLOW 2 WTIP (MISCELLANEOUS) ×3
IRRIGATION SUCT STRKRFLW 2 WTP (MISCELLANEOUS) ×2 IMPLANT
KIT PROCEDURE DA VINCI SI (MISCELLANEOUS) ×1
KIT PROCEDURE DVNC SI (MISCELLANEOUS) ×2 IMPLANT
LOOP VESSEL MAXI BLUE (MISCELLANEOUS) ×3 IMPLANT
NEEDLE ASPIRATION 22 (NEEDLE) ×3 IMPLANT
NEEDLE INSUFFLATION 14GA 120MM (NEEDLE) ×3 IMPLANT
PACK ROBOT UROLOGY CUSTOM (CUSTOM PROCEDURE TRAY) ×3 IMPLANT
PAD POSITIONING PINK XL (MISCELLANEOUS) ×3 IMPLANT
PORT ACCESS TROCAR AIRSEAL 12 (TROCAR) ×2 IMPLANT
PORT ACCESS TROCAR AIRSEAL 5M (TROCAR) ×1
RELOAD STAPLER GREEN 60MM (STAPLE) ×10 IMPLANT
RELOAD STAPLER WHITE 60MM (STAPLE) ×16 IMPLANT
RETRACTOR LONRSTAR 16.6X16.6CM (MISCELLANEOUS) IMPLANT
RETRACTOR STAY HOOK 5MM (MISCELLANEOUS) IMPLANT
RETRACTOR STER APS 16.6X16.6CM (MISCELLANEOUS)
RTRCTR WOUND ALEXIS 18CM MED (MISCELLANEOUS) ×3
SEAL CANN UNIV 5-8 DVNC XI (MISCELLANEOUS) ×8 IMPLANT
SEAL XI 5MM-8MM UNIVERSAL (MISCELLANEOUS) ×4
SET TRI-LUMEN FLTR TB AIRSEAL (TUBING) ×3 IMPLANT
SLEEVE SURGEON STRL (DRAPES) ×3 IMPLANT
SOLUTION ELECTROLUBE (MISCELLANEOUS) ×3 IMPLANT
SPONGE LAP 18X18 RF (DISPOSABLE) ×6 IMPLANT
SPONGE LAP 4X18 RFD (DISPOSABLE) ×3 IMPLANT
STAPLER ECHELON LONG 60 440 (INSTRUMENTS) ×3 IMPLANT
STAPLER RELOAD GREEN 60MM (STAPLE) ×15
STAPLER RELOAD WHITE 60MM (STAPLE) ×24
STENT SET URETHERAL LEFT 7FR (STENTS) ×3 IMPLANT
STENT SET URETHERAL RIGHT 7FR (STENTS) ×3 IMPLANT
SURGIFLO W/THROMBIN 8M KIT (HEMOSTASIS) IMPLANT
SUT CHROMIC 4 0 RB 1X27 (SUTURE) ×3 IMPLANT
SUT ETHIBOND 0 (SUTURE) ×15 IMPLANT
SUT ETHILON 3 0 PS 1 (SUTURE) ×3 IMPLANT
SUT MNCRL AB 4-0 PS2 18 (SUTURE) ×6 IMPLANT
SUT PDS AB 0 CTX 36 PDP370T (SUTURE) ×9 IMPLANT
SUT SILK 3 0 SH 30 (SUTURE) IMPLANT
SUT SILK 3 0 SH CR/8 (SUTURE) ×3 IMPLANT
SUT VIC AB 2-0 SH 18 (SUTURE) IMPLANT
SUT VIC AB 2-0 UR5 27 (SUTURE) ×12 IMPLANT
SUT VIC AB 3-0 SH 27 (SUTURE) ×4
SUT VIC AB 3-0 SH 27X BRD (SUTURE) ×4 IMPLANT
SUT VIC AB 3-0 SH 27XBRD (SUTURE) ×4 IMPLANT
SUT VIC AB 4-0 RB1 27 (SUTURE) ×4
SUT VIC AB 4-0 RB1 27XBRD (SUTURE) ×8 IMPLANT
SUT VICRYL 0 UR6 27IN ABS (SUTURE) ×3 IMPLANT
SUT VLOC BARB 180 ABS3/0GR12 (SUTURE) ×3
SUTURE VLOC BRB 180 ABS3/0GR12 (SUTURE) ×2 IMPLANT
SYR 10ML LL (SYRINGE) ×3 IMPLANT
SYSTEM UROSTOMY GENTLE TOUCH (WOUND CARE) ×3 IMPLANT
TOWEL OR NON WOVEN STRL DISP B (DISPOSABLE) ×3 IMPLANT
TROCAR BLADELESS 15MM (ENDOMECHANICALS) ×3 IMPLANT
WATER STERILE IRR 1000ML POUR (IV SOLUTION) ×3 IMPLANT
YANKAUER SUCT BULB TIP 10FT TU (MISCELLANEOUS) IMPLANT

## 2018-08-10 NOTE — Transfer of Care (Signed)
Immediate Anesthesia Transfer of Care Note  Patient: Steve Gilbert  Procedure(s) Performed: ROBOT ASSISTED LAPAROSCOPIC RADICAL CYSTOPROSTATECTOMY BILATERAL PELVIC LYMPHADENECTOMY, ILEAL CONDUIT, RIGHT INGUINAL HERNIA REPAIR (N/A ) CYSTOSCOPY WITH STENT REMOVAL (Right )  Patient Location: PACU  Anesthesia Type:General  Level of Consciousness: sedated  Airway & Oxygen Therapy: Patient Spontanous Breathing and Patient connected to face mask oxygen  Post-op Assessment: Report given to RN and Post -op Vital signs reviewed and stable  Post vital signs: Reviewed and stable  Last Vitals:  Vitals Value Taken Time  BP 148/94 08/10/2018  2:03 PM  Temp    Pulse 108 08/10/2018  2:04 PM  Resp 20 08/10/2018  2:04 PM  SpO2 100 % 08/10/2018  2:04 PM  Vitals shown include unvalidated device data.  Last Pain:  Vitals:   08/10/18 0627  TempSrc:   PainSc: 0-No pain         Complications: No apparent anesthesia complications

## 2018-08-10 NOTE — Brief Op Note (Signed)
08/10/2018  4:02 PM  PATIENT:  Steve Gilbert  68 y.o. male  PRE-OPERATIVE DIAGNOSIS:  BLADDER CANCER  POST-OPERATIVE DIAGNOSIS:  BLADDER CANCER  PROCEDURE:  Procedure(s) with comments: ROBOT ASSISTED LAPAROSCOPIC RADICAL CYSTOPROSTATECTOMY BILATERAL PELVIC LYMPHADENECTOMY, ILEAL CONDUIT, RIGHT INGUINAL HERNIA REPAIR (N/A) - 5.5 HRS CYSTOSCOPY WITH STENT REMOVAL (Right)  SURGEON:  Surgeon(s) and Role:    * Alexis Frock, MD - Primary  PHYSICIAN ASSISTANT:   ASSISTANTS: Debbrah Alar PA   ANESTHESIA:   local and general  EBL:  350 mL   BLOOD ADMINISTERED:none  DRAINS: Urostomy wtih bander stents, JP to bulb   LOCAL MEDICATIONS USED:  MARCAINE     SPECIMEN:  Source of Specimen:  bladder + prostate, lymph nodes, ureteral margins  DISPOSITION OF SPECIMEN:  PATHOLOGY  COUNTS:  YES  TOURNIQUET:  * No tourniquets in log *  DICTATION: .Other Dictation: Dictation Number [id number]  PLAN OF CARE: Admit to inpatient   PATIENT DISPOSITION:  PACU - hemodynamically stable.   Delay start of Pharmacological VTE agent (>24hrs) due to surgical blood loss or risk of bleeding: yes

## 2018-08-10 NOTE — Discharge Instructions (Signed)

## 2018-08-10 NOTE — Progress Notes (Signed)
Day of Surgery   Subjective/Chief Complaint: 1 - High Grade Mus1 - High Grade Muscle Invasive BLadder Cancer with Right Malignant Hydronephrosis - s/p TURBT / Rt ureteroscopy / Rt stent 03/2018 for invasive bladder cancer by Dr. Erlene Quan in [place]. CT chest/abd/pelvis clinically localized, clinically stage 3 due tomalig hydro. Received 4 cycles dose-dense MVAC by Dr. Rogue Bussing at [facility] cancer center.   Restaging imaging 06/2018 with stable disease / not overtly metastatic.    Today " Steve Gilbert " is ready to undergo curative intent cystectomy. Completed majority (but not all) of bowel prep last night.  Hgb 13.9. Cr 1.19.    Objective: Vital signs in last 24 hours: Temp:  [98.3 F (36.8 C)-98.4 F (36.9 C)] 98.3 F (36.8 C) (01/24 0445) Pulse Rate:  [79-89] 87 (01/24 0445) Resp:  [16-20] 18 (01/24 0445) BP: (120-133)/(87-90) 133/90 (01/24 0445) SpO2:  [96 %-98 %] 96 % (01/24 0445) Weight:  [97.5 kg] 97.5 kg (01/23 1424) Last BM Date: 08/09/18  Intake/Output from previous day: 01/23 0701 - 01/24 0700 In: 938.7 [P.O.:720; I.V.:218.7] Out: -  Intake/Output this shift: No intake/output data recorded.   Physical Exam  Constitutional: He appears well-developed.  HENT:  Head: Normocephalic.  Eyes: Pupils are equal, round, and reactive to light.  Neck: Normal range of motion.  Cardiovascular: Normal rate.  Respiratory: Effort normal.  GI: Soft.  Stomal marking site note Rt uperior to umbilicus.   Genitourinary:    Genitourinary Comments: No CVAT at present.   Musculoskeletal: Normal range of motion.  Neurological: He is alert.  Skin: Skin is warm.  Psychiatric: He has a normal mood and affect.   Lab Results:  Recent Labs    08/09/18 1401  WBC 3.9*  HGB 13.9  HCT 44.0  PLT 204   BMET Recent Labs    08/09/18 1401  NA 139  K 4.0  CL 103  CO2 29  GLUCOSE 124*  BUN 20  CREATININE 1.19  CALCIUM 9.3   PT/INR No results for input(s): LABPROT, INR in the last 72  hours. ABG No results for input(s): PHART, HCO3 in the last 72 hours.  Invalid input(s): PCO2, PO2  Studies/Results: No results found.  Anti-infectives: Anti-infectives (From admission, onward)   Start     Dose/Rate Route Frequency Ordered Stop   08/10/18 0600  piperacillin-tazobactam (ZOSYN) IVPB 3.375 g  Status:  Discontinued     3.375 g 100 mL/hr over 30 Minutes Intravenous 30 min pre-op 08/09/18 0826 08/09/18 0828   08/10/18 0600  [MAR Hold]  piperacillin-tazobactam (ZOSYN) IVPB 3.375 g     (MAR Hold since Fri 08/10/2018 at 0616. Reason: Transfer to a Procedural area.)   3.375 g 100 mL/hr over 30 Minutes Intravenous 30 min pre-op 08/09/18 6659        Assessment/Plan:  Proceed as planned with curative intent cystoprostatectomy with node dissectio and conduit diversion. Risks, benefits, expected peri-op course reiterated today.   Alexis Frock 08/10/2018

## 2018-08-10 NOTE — Anesthesia Procedure Notes (Signed)
Procedure Name: Intubation Date/Time: 08/10/2018 7:35 AM Performed by: Lind Covert, CRNA Pre-anesthesia Checklist: Patient identified, Emergency Drugs available, Suction available and Patient being monitored Patient Re-evaluated:Patient Re-evaluated prior to induction Oxygen Delivery Method: Circle system utilized Preoxygenation: Pre-oxygenation with 100% oxygen Induction Type: IV induction Ventilation: Mask ventilation without difficulty Laryngoscope Size: Mac and 4 Grade View: Grade I Tube size: 7.5 mm Number of attempts: 1 Airway Equipment and Method: Stylet Placement Confirmation: ETT inserted through vocal cords under direct vision,  positive ETCO2 and breath sounds checked- equal and bilateral Secured at: 22 cm Tube secured with: Tape Dental Injury: Teeth and Oropharynx as per pre-operative assessment

## 2018-08-11 LAB — BASIC METABOLIC PANEL
Anion gap: 11 (ref 5–15)
BUN: 21 mg/dL (ref 8–23)
CO2: 24 mmol/L (ref 22–32)
Calcium: 8.6 mg/dL — ABNORMAL LOW (ref 8.9–10.3)
Chloride: 102 mmol/L (ref 98–111)
Creatinine, Ser: 1.5 mg/dL — ABNORMAL HIGH (ref 0.61–1.24)
GFR calc Af Amer: 55 mL/min — ABNORMAL LOW (ref >60)
GFR calc non Af Amer: 47 mL/min — ABNORMAL LOW (ref >60)
Glucose, Bld: 171 mg/dL — ABNORMAL HIGH (ref 70–99)
Potassium: 4.2 mmol/L (ref 3.5–5.1)
Sodium: 137 mmol/L (ref 135–145)

## 2018-08-11 LAB — TYPE AND SCREEN
ABO/RH(D): B POS
Antibody Screen: NEGATIVE

## 2018-08-11 LAB — HEMOGLOBIN AND HEMATOCRIT, BLOOD
HCT: 35.8 % — ABNORMAL LOW (ref 39.0–52.0)
Hemoglobin: 11.9 g/dL — ABNORMAL LOW (ref 13.0–17.0)

## 2018-08-11 MED ORDER — BISACODYL 10 MG RE SUPP
10.0000 mg | Freq: Once | RECTAL | Status: AC
  Administered 2018-08-11: 10 mg via RECTAL
  Filled 2018-08-11: qty 1

## 2018-08-11 MED ORDER — ALBUTEROL SULFATE (2.5 MG/3ML) 0.083% IN NEBU: 2.5000 mg | INHALATION_SOLUTION | Freq: Four times a day (QID) | RESPIRATORY_TRACT | Status: DC | PRN

## 2018-08-11 MED ORDER — TIOTROPIUM BROMIDE MONOHYDRATE 18 MCG IN CAPS: 18.0000 ug | ORAL_CAPSULE | Freq: Every day | RESPIRATORY_TRACT | Status: DC | PRN

## 2018-08-11 MED ORDER — AZELASTINE HCL 0.1 % NA SOLN
1.0000 | Freq: Two times a day (BID) | NASAL | Status: DC | PRN
  Administered 2018-08-11 – 2018-08-12 (×2): 1 via NASAL
  Filled 2018-08-11: qty 30

## 2018-08-11 MED ORDER — METOPROLOL TARTRATE 5 MG/5ML IV SOLN
5.0000 mg | Freq: Three times a day (TID) | INTRAVENOUS | Status: DC
  Administered 2018-08-11 – 2018-08-14 (×10): 5 mg via INTRAVENOUS
  Filled 2018-08-11 (×10): qty 5

## 2018-08-11 MED ORDER — PROMETHAZINE HCL 25 MG/ML IJ SOLN
12.5000 mg | Freq: Four times a day (QID) | INTRAMUSCULAR | Status: DC | PRN
  Administered 2018-08-11: 12.5 mg via INTRAVENOUS
  Filled 2018-08-11: qty 1

## 2018-08-11 MED ORDER — UMECLIDINIUM BROMIDE 62.5 MCG/INH IN AEPB
1.0000 | INHALATION_SPRAY | Freq: Every day | RESPIRATORY_TRACT | Status: DC | PRN
  Filled 2018-08-11: qty 7

## 2018-08-11 MED ORDER — HEPARIN SODIUM (PORCINE) 5000 UNIT/ML IJ SOLN
5000.0000 [IU] | Freq: Three times a day (TID) | INTRAMUSCULAR | Status: DC
  Administered 2018-08-11 – 2018-08-16 (×15): 5000 [IU] via SUBCUTANEOUS
  Filled 2018-08-11 (×15): qty 1

## 2018-08-11 MED ORDER — PANTOPRAZOLE SODIUM 40 MG IV SOLR
40.0000 mg | Freq: Once | INTRAVENOUS | Status: AC
  Administered 2018-08-11: 40 mg via INTRAVENOUS
  Filled 2018-08-11: qty 40

## 2018-08-11 NOTE — Progress Notes (Signed)
Spoke with Urology in regards to new onset vomiting and tachycardia (150s). Zofran given with no relief. MD verbally ordered phenegran and protonix IV. Will continue to monitor

## 2018-08-11 NOTE — Progress Notes (Signed)
Pt HR sustaining 130s. Resp 24. Labored breathing. Oxygen saturations 95% on RA. Notified MD. Will continue to monitor.

## 2018-08-11 NOTE — Progress Notes (Signed)
1 Day Post-Op   Subjective/Chief Complaint:  1 - Bladder Cancer - s/p robotic cystoprostatectomy with ICG sentinal + template lymphadenectomy 08/10/18 for invasive bladder cancer. Path pending. Admitted 1/23 for bowel prep/ stomal marking. Watched stepdown POD 0.  2 - Post-op Ileus - bowel anastomosis as part of surgery 1/24. Received entereg peri-op. NPO with ice chips initially post-op.  3 - Disposition / Rehab - pt independent in all ADL's at baseline. PT eval pending.   Today "Steve Gilbert" is stable. 1 Episode emesis overnight and some abd distension. Hgg, Cr, UOP acceptable.    Objective: Vital signs in last 24 hours: Temp:  [96.8 F (36 C)-98.8 F (37.1 C)] 98.6 F (37 C) (01/25 0717) Pulse Rate:  [100-134] 121 (01/25 0613) Resp:  [9-23] 16 (01/25 0613) BP: (125-168)/(92-106) 152/103 (01/25 0613) SpO2:  [93 %-100 %] 96 % (01/25 0613) Last BM Date: 08/09/18  Intake/Output from previous day: 01/24 0701 - 01/25 0700 In: 6853.2 [I.V.:5888.7; IV Piggyback:964.5] Out: 2470 [Urine:1525; Emesis/NG output:300; Drains:295; Blood:350] Intake/Output this shift: No intake/output data recorded.  General appearance: alert, cooperative and appears stated age Eyes: negative Nose: Nares normal. Septum midline. Mucosa normal. No drainage or sinus tenderness. Throat: lips, mucosa, and tongue normal; teeth and gums normal Neck: supple, symmetrical, trachea midline and Rt [place] port w/o site reaction. Not accessed.  Back: symmetric, no curvature. ROM normal. No CVA tenderness. Resp: non-labored on minimal [place] O2 Cardio: regulat tachycardia by bedside monitor.  GI: Distended abd c/w ileus. RLQ urostomy pink / patent with Rt (red) and Lt (blue) bander stents. JP with serosanguinous output that is non foul.  Male genitalia: normal, mild penoscrotal swelling as anticipated.  Extremities: extremities normal, atraumatic, no cyanosis or edema Skin: Skin color, texture, turgor normal. No rashes or  lesions Lymph nodes: Cervical, supraclavicular, and axillary nodes normal. Neurologic: Grossly normal Incision/Wound: recent port and extraction sites c/d/i, no hernias.   Lab Results:  Recent Labs    08/09/18 1401 08/10/18 1445 08/11/18 0317  WBC 3.9*  --   --   HGB 13.9 11.7* 11.9*  HCT 44.0 35.3* 35.8*  PLT 204  --   --    BMET Recent Labs    08/09/18 1401 08/11/18 0317  NA 139 137  K 4.0 4.2  CL 103 102  CO2 29 24  GLUCOSE 124* 171*  BUN 20 21  CREATININE 1.19 1.50*  CALCIUM 9.3 8.6*   PT/INR No results for input(s): LABPROT, INR in the last 72 hours. ABG No results for input(s): PHART, HCO3 in the last 72 hours.  Invalid input(s): PCO2, PO2  Studies/Results: No results found.  Anti-infectives: Anti-infectives (From admission, onward)   Start     Dose/Rate Route Frequency Ordered Stop   08/10/18 0600  piperacillin-tazobactam (ZOSYN) IVPB 3.375 g  Status:  Discontinued     3.375 g 100 mL/hr over 30 Minutes Intravenous 30 min pre-op 08/09/18 0826 08/09/18 0828   08/10/18 0600  piperacillin-tazobactam (ZOSYN) IVPB 3.375 g     3.375 g 100 mL/hr over 30 Minutes Intravenous 30 min pre-op 08/09/18 3383 08/10/18 0736      Assessment/Plan:  Transfer to med-surg. Remain NPO, PT eval. Change Metop to IV given recent emesis.  Ducolax. Tachycardia from pain / ileus. No anemia or sig infectious parameters.   Alexis Frock 08/11/2018

## 2018-08-11 NOTE — Evaluation (Signed)
Physical Therapy Evaluation Patient Details Name: Steve Gilbert MRN: [id number] DOB: 1951-07-16 Today's Date: 08/11/2018   History of Present Illness  Pt s/p cystoprostatectomy 08/10/18 with post op ileus.  Pt with hx of bladder CA  Clinical Impression  Pt admitted as above and presenting with functional mobility limitations 2* post op discomfort, ltd endurance and mild balance deficits.  Pt should progress to dc home with family assist.    Follow Up Recommendations No PT follow up    Equipment Recommendations  None recommended by PT    Recommendations for Other Services       Precautions / Restrictions Precautions Precautions: Fall;Other (comment) Precaution Comments: JP drain in place on L Restrictions Weight Bearing Restrictions: No      Mobility  Bed Mobility               General bed mobility comments: NT - pt up in chair with nursing and requests back to same  Transfers Overall transfer level: Needs assistance Equipment used: Rolling walker (2 wheeled) Transfers: Sit to/from Stand Sit to Stand: Min guard         General transfer comment: steady assist  Ambulation/Gait Ambulation/Gait assistance: Min guard;+2 safety/equipment Gait Distance (Feet): 430 Feet Assistive device: Rolling walker (2 wheeled) Gait Pattern/deviations: Step-through pattern;Decreased step length - right;Decreased step length - left;Shuffle;Trunk flexed Gait velocity: decr   General Gait Details: cues for posture and position from RW; several standing rest breaks required to complete task  Stairs            Wheelchair Mobility    Modified Rankin (Stroke Patients Only)       Balance Overall balance assessment: Mild deficits observed, not formally tested                                           Pertinent Vitals/Pain Pain Assessment: No/denies pain    Home Living Family/patient expects to be discharged to:: Private residence Living  Arrangements: Spouse/significant other Available Help at Discharge: Family Type of Home: House Home Access: Stairs to enter Entrance Stairs-Rails: Right Entrance Stairs-Number of Steps: 3 Home Layout: One level Home Equipment: Environmental consultant - 2 wheels      Prior Function Level of Independence: Independent               Hand Dominance        Extremity/Trunk Assessment   Upper Extremity Assessment Upper Extremity Assessment: Overall WFL for tasks assessed    Lower Extremity Assessment Lower Extremity Assessment: Overall WFL for tasks assessed    Cervical / Trunk Assessment Cervical / Trunk Assessment: Normal  Communication   Communication: No difficulties  Cognition Arousal/Alertness: Awake/alert Behavior During Therapy: WFL for tasks assessed/performed Overall Cognitive Status: Within Functional Limits for tasks assessed                                        General Comments      Exercises     Assessment/Plan    PT Assessment Patient needs continued PT services  PT Problem List Decreased strength;Decreased activity tolerance;Decreased balance;Decreased knowledge of use of DME;Decreased mobility;Obesity       PT Treatment Interventions DME instruction;Gait training;Stair training;Functional mobility training;Therapeutic activities;Therapeutic exercise;Patient/family education    PT Goals (Current goals can be found in the  Care Plan section)  Acute Rehab PT Goals Patient Stated Goal: Regain IND PT Goal Formulation: With patient Time For Goal Achievement: 08/25/18 Potential to Achieve Goals: Good    Frequency 7X/week   Barriers to discharge        Co-evaluation               AM-PAC PT "6 Clicks" Mobility  Outcome Measure Help needed turning from your back to your side while in a flat bed without using bedrails?: A Little Help needed moving from lying on your back to sitting on the side of a flat bed without using bedrails?: A  Little Help needed moving to and from a bed to a chair (including a wheelchair)?: A Little Help needed standing up from a chair using your arms (e.g., wheelchair or bedside chair)?: A Little Help needed to walk in hospital room?: A Little Help needed climbing 3-5 steps with a railing? : A Little 6 Click Score: 18    End of Session   Activity Tolerance: Patient tolerated treatment well Patient left: in chair;with call bell/phone within reach Nurse Communication: Mobility status PT Visit Diagnosis: Difficulty in walking, not elsewhere classified (R26.2)    Time: 5686-1683 PT Time Calculation (min) (ACUTE ONLY): 22 min   Charges:   PT Evaluation $PT Eval Low Complexity: 1 Low          [place] Pager [phone] Office [phone]   Jnaya Butrick 08/11/2018, 11:39 AM

## 2018-08-12 LAB — BASIC METABOLIC PANEL
Anion gap: 8 (ref 5–15)
BUN: 20 mg/dL (ref 8–23)
CO2: 28 mmol/L (ref 22–32)
Calcium: 8.5 mg/dL — ABNORMAL LOW (ref 8.9–10.3)
Chloride: 102 mmol/L (ref 98–111)
Creatinine, Ser: 1.38 mg/dL — ABNORMAL HIGH (ref 0.61–1.24)
GFR calc Af Amer: >60 mL/min (ref >60)
GFR calc non Af Amer: 53 mL/min — ABNORMAL LOW (ref >60)
Glucose, Bld: 127 mg/dL — ABNORMAL HIGH (ref 70–99)
POTASSIUM: 3.4 mmol/L — AB (ref 3.5–5.1)
Sodium: 138 mmol/L (ref 135–145)

## 2018-08-12 LAB — HEMOGLOBIN AND HEMATOCRIT, BLOOD
HCT: 32.6 % — ABNORMAL LOW (ref 39.0–52.0)
Hemoglobin: 10.6 g/dL — ABNORMAL LOW (ref 13.0–17.0)

## 2018-08-12 MED ORDER — PROMETHAZINE HCL 25 MG/ML IJ SOLN: 12.5000 mg | Freq: Four times a day (QID) | INTRAMUSCULAR | Status: DC | PRN

## 2018-08-12 MED ORDER — ONDANSETRON HCL 4 MG/2ML IJ SOLN: 4.0000 mg | INTRAMUSCULAR | Status: DC | PRN

## 2018-08-12 MED ORDER — ACETAMINOPHEN 325 MG PO TABS
650.0000 mg | ORAL_TABLET | Freq: Four times a day (QID) | ORAL | Status: DC | PRN
  Administered 2018-08-12 – 2018-08-16 (×2): 650 mg via ORAL
  Filled 2018-08-12 (×2): qty 2

## 2018-08-12 NOTE — Progress Notes (Signed)
Encouraging patient to ambulate in the hall but he kept saying later, he is not ready at the moment. Only walked once on my shift.

## 2018-08-12 NOTE — Anesthesia Postprocedure Evaluation (Signed)
Anesthesia Post Note  Patient: Steve Gilbert  Procedure(s) Performed: ROBOT ASSISTED LAPAROSCOPIC RADICAL CYSTOPROSTATECTOMY BILATERAL PELVIC LYMPHADENECTOMY, ILEAL CONDUIT, RIGHT INGUINAL HERNIA REPAIR (N/A ) CYSTOSCOPY WITH STENT REMOVAL (Right )     Patient location during evaluation: PACU Anesthesia Type: General Level of consciousness: sedated and patient uncooperative Pain management: pain level controlled Vital Signs Assessment: post-procedure vital signs reviewed and stable Respiratory status: spontaneous breathing Cardiovascular status: stable Anesthetic complications: no    Last Vitals:  Vitals:   08/12/18 2004 08/12/18 2034  BP:  (!) 145/87  Pulse:  (!) 107  Resp:  20  Temp:  36.9 C  SpO2: 96% 95%    Last Pain:  Vitals:   08/12/18 2034  TempSrc: Oral  PainSc:                  Nolon Nations

## 2018-08-12 NOTE — Progress Notes (Signed)
2 Days Post-Op   Subjective/Chief Complaint:  1 - Bladder Cancer - s/p robotic cystoprostatectomy with ICG sentinal + template lymphadenectomy 08/10/18 for invasive bladder cancer. Path pending. Admitted 1/23 for bowel prep/ stomal marking. Watched stepdown POD 0.  2 - Post-op Ileus - bowel anastomosis as part of surgery 1/24. Received entereg peri-op. NPO with ice chips initially post-op. No nausea/vomiting overnight. Passing flatus. Recorded BM was more likely passage of suppository.  3 - Disposition / Rehab - pt independent in all ADL's at baseline. PT evaluated 1/25 with no additional treatment needed.  NAEON. Some SOB improved with use of home inhalers. AF, mildly tachycardic but other vitals stable. Denies nausea/vomiting. Ambulating. Labs WNL.   Objective: Vital signs in last 24 hours: Temp:  [98.5 F (36.9 C)-99.7 F (37.6 C)] 99 F (37.2 C) (01/26 0617) Pulse Rate:  [106-134] 109 (01/26 0617) Resp:  [17-24] 22 (01/26 0617) BP: (135-148)/(84-104) 136/84 (01/26 0617) SpO2:  [90 %-98 %] 91 % (01/26 0820) Last BM Date: 08/11/18  Intake/Output from previous day: 01/25 0701 - 01/26 0700 In: 2368.2 [I.V.:2368.2] Out: 0300 [Urine:2650; Drains:725] Intake/Output this shift: No intake/output data recorded.  General appearance: alert, cooperative and appears stated age Back: No CVA tenderness. Resp: non-labored on room air Cardio: mildly tachycardic GI: Distended abd c/w ileus. RLQ urostomy pink / patent with Rt (red) and Lt (blue) bander stents. JP with serosanguinous output Extremities: extremities normal, atraumatic, no cyanosis or edema Skin: Skin color, texture, turgor normal. No rashes or lesions Neurologic: Grossly normal Incision/Wound: recent port and extraction sites c/d/i, no hernias.   Lab Results:  Recent Labs    08/09/18 1401  08/11/18 0317 08/12/18 0434  WBC 3.9*  --   --   --   HGB 13.9   < > 11.9* 10.6*  HCT 44.0   < > 35.8* 32.6*  PLT 204  --   --    --    < > = values in this interval not displayed.   BMET Recent Labs    08/11/18 0317 08/12/18 0434  NA 137 138  K 4.2 3.4*  CL 102 102  CO2 24 28  GLUCOSE 171* 127*  BUN 21 20  CREATININE 1.50* 1.38*  CALCIUM 8.6* 8.5*   PT/INR No results for input(s): LABPROT, INR in the last 72 hours. ABG No results for input(s): PHART, HCO3 in the last 72 hours.  Invalid input(s): PCO2, PO2  Studies/Results: No results found.  Anti-infectives: Anti-infectives (From admission, onward)   Start     Dose/Rate Route Frequency Ordered Stop   08/10/18 0600  piperacillin-tazobactam (ZOSYN) IVPB 3.375 g  Status:  Discontinued     3.375 g 100 mL/hr over 30 Minutes Intravenous 30 min pre-op 08/09/18 0826 08/09/18 0828   08/10/18 0600  piperacillin-tazobactam (ZOSYN) IVPB 3.375 g     3.375 g 100 mL/hr over 30 Minutes Intravenous 30 min pre-op 08/09/18 9233 08/10/18 0736      Assessment/Plan:  68 y.o. male with history of MIBC s/p NAC, now POD#2 s/p RC/IC.  - Continue pain control with PO/IV meds. - Advance to CLD, continue IVFs at 100 mL/hr. - Tachycardia improving and likely secondary to pain / ileus. - Continue IV metop until patient taking consistent PO. - Continue Entereg. - AM labs. - Wound ostomy teaching. - OOB, SCDs, IS, SQH.   Case Rob Bunting 08/12/2018

## 2018-08-13 ENCOUNTER — Encounter (HOSPITAL_COMMUNITY): Payer: Self-pay | Admitting: Urology

## 2018-08-13 LAB — BASIC METABOLIC PANEL
ANION GAP: 8 (ref 5–15)
BUN: 14 mg/dL (ref 8–23)
CO2: 28 mmol/L (ref 22–32)
Calcium: 8.5 mg/dL — ABNORMAL LOW (ref 8.9–10.3)
Chloride: 100 mmol/L (ref 98–111)
Creatinine, Ser: 1.2 mg/dL (ref 0.61–1.24)
GFR calc Af Amer: >60 mL/min (ref >60)
GFR calc non Af Amer: >60 mL/min (ref >60)
Glucose, Bld: 151 mg/dL — ABNORMAL HIGH (ref 70–99)
Potassium: 3 mmol/L — ABNORMAL LOW (ref 3.5–5.1)
Sodium: 136 mmol/L (ref 135–145)

## 2018-08-13 LAB — CREATININE, FLUID (PLEURAL, PERITONEAL, JP DRAINAGE): CREAT FL: 1.2 mg/dL

## 2018-08-13 LAB — HEMOGLOBIN AND HEMATOCRIT, BLOOD
HCT: 35.1 % — ABNORMAL LOW (ref 39.0–52.0)
Hemoglobin: 11.6 g/dL — ABNORMAL LOW (ref 13.0–17.0)

## 2018-08-13 MED ORDER — POTASSIUM & SODIUM PHOSPHATES 280-160-250 MG PO PACK
1.0000 | PACK | Freq: Three times a day (TID) | ORAL | Status: DC
  Administered 2018-08-13: 1 via ORAL
  Filled 2018-08-13 (×2): qty 1

## 2018-08-13 MED ORDER — POTASSIUM CHLORIDE IN NACL 40-0.9 MEQ/L-% IV SOLN
INTRAVENOUS | Status: DC
  Administered 2018-08-13 – 2018-08-14 (×3): 75 mL/h via INTRAVENOUS
  Filled 2018-08-13 (×4): qty 1000

## 2018-08-13 NOTE — Consult Note (Addendum)
[place] Nurse ostomy consult note Stoma type/location: New IC created by Dr. Tresa Moore on 08/10/18 Stomal assessment/size: 1 and 1/2 inch round, red, budded, os at center, moist. Two stents intact, Red = right, Blue = left Peristomal assessment: Peristomal medical adhesive related skin injury (pMARSI) located nearly circumferentially at adhesive edge Treatment options for stomal/peristomal skin: covered areas with thin film transparent dressing (Tegaderm) prior to pouching Output: dark yellow urine Ostomy pouching: 1pc./convex with skin barrier ring.  Education provided: Patient reassured that stoma was round, budded. Has read booklet.  Requests to have session tomorrow when wife visits after work (1:45pm) Enrolled patient in [place] program: No  [facility] nursing team will follow, and will remain available to this patient, the nursing and medical teams.   Thanks, Maudie Flakes, MSN, RN, [place], Arther Abbott  Pager# [phone]

## 2018-08-13 NOTE — Progress Notes (Signed)
3 Days Post-Op   Subjective/Chief Complaint:  1 - Bladder Cancer - s/p robotic cystoprostatectomy with ICG sentinal + template lymphadenectomy 08/10/18 for invasive bladder cancer. Path pending. Admitted 1/23 for bowel prep/ stomal marking. Watched in stepdown POD 0.  2 - Post-op Ileus - bowel anastomosis as part of surgery 1/24. Received entereg peri-op. NPO with ice chips initially post-op. Recorded BM was more likely passage of suppository. Now tolerating CLD and consistently passing flatus.  3 - Disposition / Rehab - pt independent in all ADL's at baseline. PT evaluated 1/25 with no additional treatment needed.  NAEON. AF with improving tachycardia to low 100s. Denies nausea/vomiting. Ambulating. Labs WNL. Drain output elevated.   Objective: Vital signs in last 24 hours: Temp:  [97.9 F (36.6 C)-100.2 F (37.9 C)] 97.9 F (36.6 C) (01/27 0509) Pulse Rate:  [101-113] 101 (01/27 0509) Resp:  [14-20] 18 (01/27 0509) BP: (129-152)/(87-95) 152/95 (01/27 0509) SpO2:  [91 %-97 %] 95 % (01/27 0509) Last BM Date: 08/11/18  Intake/Output from previous day: 01/26 0701 - 01/27 0700 In: 2370.8 [I.V.:2370.8] Out: 2810 [Urine:2175; Drains:635] Intake/Output this shift: No intake/output data recorded.  General appearance: alert, cooperative and appears stated age Back: No CVA tenderness. Resp: non-labored on room air Cardio: mildly tachycardic GI: Distended abd c/w ileus. RLQ urostomy pink / patent with Rt (red) and Lt (blue) bander stents. JP with serosanguinous output Extremities: extremities normal, atraumatic, no cyanosis or edema Skin: Skin color, texture, turgor normal. No rashes or lesions Neurologic: Grossly normal Incision/Wound: recent port and extraction sites c/d/i, no hernias.   Lab Results:  Recent Labs    08/12/18 0434 08/13/18 0413  HGB 10.6* 11.6*  HCT 32.6* 35.1*   BMET Recent Labs    08/12/18 0434 08/13/18 0413  NA 138 136  K 3.4* 3.0*  CL 102 100  CO2  28 28  GLUCOSE 127* 151*  BUN 20 14  CREATININE 1.38* 1.20  CALCIUM 8.5* 8.5*   PT/INR No results for input(s): LABPROT, INR in the last 72 hours. ABG No results for input(s): PHART, HCO3 in the last 72 hours.  Invalid input(s): PCO2, PO2  Studies/Results: No results found.  Anti-infectives: Anti-infectives (From admission, onward)   Start     Dose/Rate Route Frequency Ordered Stop   08/10/18 0600  piperacillin-tazobactam (ZOSYN) IVPB 3.375 g  Status:  Discontinued     3.375 g 100 mL/hr over 30 Minutes Intravenous 30 min pre-op 08/09/18 0826 08/09/18 0828   08/10/18 0600  piperacillin-tazobactam (ZOSYN) IVPB 3.375 g     3.375 g 100 mL/hr over 30 Minutes Intravenous 30 min pre-op 08/09/18 8032 08/10/18 0736      Assessment/Plan:  68 y.o. male with history of MIBC s/p NAC, now POD#3 s/p RC/IC.  - Continue pain control with PO/IV meds. - Continue CLD, decrease IVFs to 75 mL/hr. - Tachycardia improving and likely secondary to pain / ileus. - Continue IV metop until patient taking consistent PO. - Continue Entereg. - Send JP fluid for Cr today - AM labs. - Wound ostomy teaching. - OOB, SCDs, IS, SQH.   Steve Gilbert Rob Bunting 08/13/2018

## 2018-08-13 NOTE — Care Management Important Message (Signed)
Important Message  Patient Details  Name: Steve Gilbert MRN: [id number] Date of Birth: [birth year]   Medicare Important Message Given:  Yes    Kerin Salen 08/13/2018, 11:46 AMImportant Message  Patient Details  Name: Steve Gilbert MRN: [id number] Date of Birth: March 25, 1951   Medicare Important Message Given:  Yes    Kerin Salen 08/13/2018, 11:46 AM

## 2018-08-13 NOTE — Op Note (Signed)
NAME: Steve Gilbert, Steve Gilbert MEDICAL RECORD DG:[id number] ACCOUNT [id number] DATE OF BIRTH:1951/06/30 FACILITY: [facility] LOCATION: [facility] PHYSICIAN:Jaslen Adcox, MD  OPERATIVE REPORT  DATE OF PROCEDURE:  08/10/2018  PREOPERATIVE DIAGNOSIS:  Muscle invasive bladder cancer with  malignant obstruction, large right umbilical hernia.  PROCEDURE: 1. Cystoscopy with an indocyanine green dye injection. 2. Robotic-assisted laparoscopic radical cystoprostatectomy with ileal conduit diversion and pelvic lymphadenectomy. 3. Laparoscopic right inguinal hernia repair. 4. Right ureteral stent removal.  ASSISTANT:  Amanda Dancy, PA-C  ESTIMATED BLOOD LOSS:  350 mL.  DRAINS: 1. Jackson-Pratt drain to bulb suction. 2. Right lower quadrant urostomy to gravity drainage with right (red) and left (blue) Bander stents.  FINDINGS: 1. Residual nodular tissue at the right bladder base close to the ureteral orifice consistent with known bladder cancer. 2. Multifocal sentinel lymph nodes throughout the pelvis, demarcated on pathology requisition. 3. Large right inguinal hernia containing fat as well as some bladder contents. 4. Right hydroureteronephrosis.  INDICATIONS:  The patient is a pleasant 68 year old gentleman with history of muscle invasive bladder cancer.  It was initially found and treated in [place].  He underwent neoadjuvant chemotherapy with curative intent given localized disease.  He has  been stented on the right side given history of hydronephrosis.  He tolerated his chemotherapy quite well.  Restaging imaging was unremarkable for any locally advanced or distant disease.  Further options were discussed including continuative curative  intent path with surgery, cystoprostatectomy with curative intent, and he wished to proceed.  Preoperative imaging also corroborated a fairly large right inguinal hernia, likely also containing some bladder contents.  Would need to be dealt with  concomitantly.   The patient was admitted to the hospital yesterday for bowel prepping and labs, and he presents for surgery today.  Informed consent was obtained and placed in medical record.  PROCEDURE IN DETAIL:  The patient was identified and the procedure being robotic cystoprostatectomy with lymph node dissection, ileal conduit diversion, right inguinal hernia repair, right stent removal was confirmed.  Procedure timeout was performed.   Intravenous antibiotics were administered.  General endotracheal anesthesia induced.  The patient was placed into a low lithotomy position.  A sterile field was created.  Prepped and draped the base of the penis, perineum and proximal thighs using  iodine.  Cystourethroscopy was performed with a 24-French injection scope sheath.  Inspection of the anterior and posterior urethra revealed a prior prostatic biopsy, and resection of the bladder neck revealed some residual nodular tissue in the area of  the bladder neck close to the right ureteral orifice.  The distal right stent was seen in situ.  It was grasped and brought out in its entirety and set aside for discard.  Next, 2 mL of indocyanine green dye was injected across approximately 6 submucosal  blebs in the area of prior nodular tumor for sentinel lymph node angiography, and a Foley catheter was placed free to straight drain, 15 mL sterile water in the balloon.  The usual sterile field was created by prepping and draping the patient's  infra-xiphoid abdomen using chlorhexidine gluconate after clipper shaving and further fastened to the table using 3-inch tape over foam padding, A test of steep Trendelenburg positioning was performed, and he was found to be suitably positioned.  His  arms were tucked to his side using gel rolls.  Sequential compression devices were confirmed, and a high-flow, low-pressure pneumoperitoneum was obtained with Veress technique in the supraumbilical midline and passed the aspiration and drop test.  An 8  mm robotic camera port was then placed in the same location.  Laparoscopic examination of the peritoneal cavity showed no significant adhesions, no visceral injury.  Distal ports were placed as follows:  Right paramedian 8 mm robotic port, right far  lateral 12 mm AirSeal assist port, right paramedian 15 mm assistant port sites, the previously marked stomal site, left paramedian 8 mm robotic port, left far lateral 8 mm robotic port.  Robot was docked and passed the electronic checks.  Attention was  directed at development of the right retroperitoneum.  Incision was made lateral to the ascending colon from the area of the cecum superiorly and distally, lateral to the right medial umbilical ligament.  A large wide-mouth inguinal hernia was  immediately noted.  This was containing mostly loose fatty contents but also likely some right lateral bladder dome area.  Hernia sac was reduced with the bowel contents as the right medial umbilical ligaments were swept away from the lateral aspect of  the abdominal wall.  This peritoneal incision was also included and extended to the area of the iliac vessels towards the area of the iliac bifurcation.  The ureter was encountered in this location where it was marked with a vessel loop and dissected  distally to the area of the ureterovesical junction.  Given the large caliber of the ureter, it was controlled using a stapling technique distally and a small tagged suture proximally with white color tag.  Frozen section was negative for carcinoma.  It  was then dissected proximally for a distance of approximately 4 cm above the iliac crossing and then placed in orientation outside of the true pelvis.  Attention was then directed to the right-sided pelvic lymphadenectomy.  The pelvis was inspected under  near infrared fluorescent light, and multiple sentinel ____ lymph nodes were noted, some in perivesical location, others within the standard template lymph nodes.  As such,  template lymphadenectomy was performed, first on the right external iliac group  with the boundaries being right external iliac artery, vein, pelvic sidewall, iliac bifurcation.  Lymphostasis achieved with cold clips, set aside, and labeled as such.  Next, right obturator group was dissected free with the boundaries being right  external iliac vein, pelvic sidewall, obturator nerve.  Lymphostasis was achieved with cold clips, set aside and labeled right obturator lymph nodes.  A sentinel lymph node in right perivesical location was dissected free and labeled as such.   Appropriate tissue overlying the area between the aortic bifurcation and iliac bifurcation was dissected free and labeled as such, and finally internal iliac tissue from the iliac bifurcation to the area of the superior vesicle artery was dissected free  and labeled as such.  Attention was directed at the left-sided retroperitoneal dissection.  Incision was made lateral to the left medial umbilical ligament from the anterior abdominal wall, coursing along the iliac vessels and then lateral to the  ascending colon, only a posterior peritoneal flap to be developed.  This was also carried over the iliac vessels towards the area of the aortic bifurcation, and the left ureter was encountered.  It was marked with a vessel loop, dissected distally to the  ureterovesical junction, which was doubly clipped and ligated.  Frozen section was negative for carcinoma.  Proximal marked with a dyed tag suture.  It was then dissected for a distance of approximately 6 cm above the area of the iliac crossing, taking  exquisite care to avoid devascularization of the ureter.  It was then put  in position outside of the true pelvis.  Left-sided lymphadenectomy was performed at the left external, left obturator, left common iliac lymph node groups, respectively.   Attention was directed at posterior dissection.  A posterior peritoneal flap was created just posterior  to the seminal vesicles, and the flap was carried behind the bladder and prostate towards the area of the prostatic apex.  This exposed the vascular  pedicles of the bladder and prostate on each side.  The endopelvic fascia was carefully swept away from the lateral aspect of the prostate and base to apex orientation on both sides.  A pedicle control was performed using 2 fires of the white load  stapler on each side, taking exquisite care to avoid any bowel injury, nerve injury, which did not occur.  Anterior attachments were taken down using cautery scissors, exposing the space of Retzius.  The dorsal venous complex was visualized and  controlled using a green load stapler.  Final apical was performed by placing the cystoprostatectomy specimen on gentle superior traction, transecting the membranous urethra coldly, placing 2 extra-large clips on the Foley catheter balloon, which was then  used as a bucket handle.  The cystoprostatectomy specimen was then placed in an extra-large EndoCatch bag for later retrieval.  Digital rectal exam was then performed using indicator glove under laparoscopic vision.  No evidence of rectal violation was  noted.  The membranous urethral stump was oversewn using 3-0 V-Loc to prevent copious penile drainage.  Attention was directed at the transition of the left ureter.  A posterior peritoneal window was created just above the area of the aortic bifurcation  behind the level of the colon via the right-sided posterior peritoneal incision, and the left ureter was identified and brought via this window to the right side.  The ileocecal junction was then visualized, and a tagged suture was applied to a segment  of the distal ileum approximately 14 cm proximally to the ileocecal junction with another clip applied distal to this to allow proximal distal orientation.  Attention was directed at the hernia repair on the right inguinal area.  Three interrupted  sutures of Ethibond were  used to reapproximate the fascia of the inguinal ring, taking exquisite care to avoid excessive tension on the iliac vessels or cord structures, which resulted in excellent reduction of the hernia defect.  Next, the previously  marked bilateral ureters and bowel segment were marked using a clip and laparoscopic needle driver via the 15 mm port site.  A closed suction drain was brought to the previous left lateral-most robotic port site near the peritoneal cavity.  The specimen  bag was taken out via the left paramedian port site.  The robot was then undocked.  The specimen was retrieved by extending the previous camera port site inferiorly, erring on the left side of the abdomen for a distance of approximately 6 cm, removing  the cystoprostatectomy specimen.  It was set aside for permanent pathology.  A wound protector was then applied via this.  Bilateral ureters were brought out through this, as was the demarcated ileal segment.  The ileal segment was then inspected and  felt to be suitable for conduit diversion.  A segment approximately 14 cm in length was taken into continuity keeping proximal distal orientation intact.  This was performed using a green load stapler proximally and distally.  The mesentery was further  developed with a white load stapler x2 distally, 1 proximally, taking exquisite care to avoid devascularization of the conduit  or anastomotic segments.  The conduit was then put into a retroperitoneal orientation and bowel-to-bowel anastomosis was  performed using side-to-side fire of the renal stapler x2 with oversewing the free end with running Vicryl and permanent running silk in a separate layer of imbricating silk.  The acute angle of the bowel anastomosis was bolstered using interrupted silk.   Mesenteric defect was closed using interrupted silk.  The bowel anastomosis was palpably patent, visibly viable.  It was put back into abdominal cavity.  Attention was directed to the  ureteroileal anastomosis.  A segment of proximal bowel in the  conduit segment was excised approximately 4 mm in length, and 4 mucosal everting sutures of 4-0 Vicryl were applied.  First, the right ureter was anastomosed after spatulating for a distance of approximately 6 mm at this site.  First, the heel stitch of  4-0 Vicryl followed by placement of a red-colored Bander stent to 26 cm anastomosis and then 2 separate running suture lines of running 4-0 Vicryl resulted in excellent tension-free apposition of the ureter and bowel.  The proximal staple line of the  conduit was excluded using running Vicryl.  Attention was directed at left-sided ureteroileal anastomosis.  It was performed as per the right, but a blue-colored Bander stent 26 cm anastomosis.  Next, a column of approximately a quarter-sized diameter of  the skin and subcutaneous fatty tissue was excised at the previously marked stomal site.  It was also used as a 15 mm port site at the level of the fascia, which was then dilated to accommodate 2 surgeon's fingers, and the distal conduit segment was  brought through this and appeared again to be viable and healthy.  It was anchored to the fascia using interrupted Vicryl x4.  Omentum was then brought over the extraction site, and the extraction site fascia was reapproximated using interrupted  figure-of-eight PDS x6 followed by reapproximation of Scarpa's with a running Vicryl.  All skin sites were closed using subcuticular Monocryl and Dermabond.  Final stoma maturation was then performed using 4 rosebudding sutures of Vicryl followed by  interrupted Vicryl in between these.  It resulted in excellent skin apposition and rosebudding of the stoma.  An ostomy appliance was applied.  There was copious efflux of light pink urine.  The needle counts were correct.  Hemostasis appeared excellent.   The patient was awakened and taken to postanesthesia care in stable condition.  Please note, first  assistant Debbrah Alar was crucial for all portions of the procedure today.  She provided invaluable retraction, vascular clipping, vascular stapling, specimen manipulation and general first assistance.     LN/NUANCE  D:08/10/2018 T:08/10/2018 JOB:[id number]

## 2018-08-14 LAB — BASIC METABOLIC PANEL
Anion gap: 9 (ref 5–15)
BUN: 16 mg/dL (ref 8–23)
CHLORIDE: 101 mmol/L (ref 98–111)
CO2: 27 mmol/L (ref 22–32)
Calcium: 8.3 mg/dL — ABNORMAL LOW (ref 8.9–10.3)
Creatinine, Ser: 1.26 mg/dL — ABNORMAL HIGH (ref 0.61–1.24)
GFR calc Af Amer: >60 mL/min (ref >60)
GFR calc non Af Amer: 59 mL/min — ABNORMAL LOW (ref >60)
Glucose, Bld: 104 mg/dL — ABNORMAL HIGH (ref 70–99)
Potassium: 3.2 mmol/L — ABNORMAL LOW (ref 3.5–5.1)
Sodium: 137 mmol/L (ref 135–145)

## 2018-08-14 LAB — HEMOGLOBIN AND HEMATOCRIT, BLOOD
HEMATOCRIT: 34.8 % — AB (ref 39.0–52.0)
Hemoglobin: 11.3 g/dL — ABNORMAL LOW (ref 13.0–17.0)

## 2018-08-14 MED ORDER — METOPROLOL TARTRATE 25 MG PO TABS
25.0000 mg | ORAL_TABLET | Freq: Two times a day (BID) | ORAL | Status: DC
  Administered 2018-08-14 – 2018-08-16 (×5): 25 mg via ORAL
  Filled 2018-08-14 (×5): qty 1

## 2018-08-14 NOTE — Consult Note (Addendum)
[place] Nurse ostomy follow up Stoma type/location: RUQ ileal conduit (08/10/2018 by Dr. Tresa Moore) Stomal assessment/size: 1 and 1/2 inches round, red, moist os at center, 2 stents in tact.Peristomal assessment:  Treatment options for stomal/peristomal skin: Thin film dressings applied over areas of pMARSI yesterday. Plan to change/assess/reapply with tomorrow/s pouch change. Output: dark yellow urine Ostomy pouching: 1pc.convex pouch with skin barrier ring applied 1/27  Education provided: Explained role of ostomy nurse and creation of stoma  Explained stoma characteristics (budded, flush, color, texture, care) Education on emptying when 1/3 to 1/2 full and how to empty Education on urine characteristics (sediment, mucous) Demonstrated hooking pouch to nighttime drainage bag Discussed bathing dehydration  Discussed risk of peristomal hernia Answered patient/family questions:   Enrolled patient in [employer] Discharge program: Yes  Plan for pouch change with wife attending tomorrow at 1:30pm  Request consideration for [facility] for continues support and reinforcement of education, also assistance with resizing of stoma in first weeks post op.If you agree, please order/arrange with Case Management.   [facility] nursing team will follow, and will remain available to this patient, the nursing and medical teams.  Thanks, Maudie Flakes, MSN, RN, [place], Arther Abbott  Pager# [phone]

## 2018-08-14 NOTE — Progress Notes (Addendum)
4 Days Post-Op   Subjective/Chief Complaint:  1 - Bladder Cancer - s/p robotic cystoprostatectomy with ICG sentinal + template lymphadenectomy 08/10/18 for invasive bladder cancer. Path pending. Admitted 1/23 for bowel prep/ stomal marking. Watched in stepdown POD 0.  2 - Post-op Ileus - bowel anastomosis as part of surgery 1/24. Received entereg peri-op. NPO with ice chips initially post-op. Now tolerating CLD and consistently passing flatus with occasional small BMs.  3 - Disposition / Rehab - pt independent in all ADL's at baseline. PT evaluated 1/25 with no additional treatment needed. WOCN saw yesterday, no additional teaching needed.  NAEON. AF with improving tachycardia to low 90s. Denies nausea/vomiting. Tolerating CLD. Ambulating. Labs WNL. Drain fluid sent for Cr yesterday and returned same as serum.   Objective: Vital signs in last 24 hours: Temp:  [97.7 F (36.5 C)-98.6 F (37 C)] 98.6 F (37 C) (01/28 0523) Pulse Rate:  [90-107] 90 (01/28 0523) Resp:  [18-20] 20 (01/28 0523) BP: (126-153)/(86-100) 126/86 (01/28 0523) SpO2:  [95 %-98 %] 96 % (01/28 0523) Last BM Date: 08/13/18  Intake/Output from previous day: 01/27 0701 - 01/28 0700 In: 1798.5 [P.O.:720; I.V.:1078.5] Out: 2660 [Urine:2025; Drains:635] Intake/Output this shift: No intake/output data recorded.  General appearance: alert, cooperative and appears stated age Back: No CVA tenderness. Resp: non-labored on room air Cardio: mildly tachycardic GI: improved abdominal distension. RLQ urostomy pink / patent with Rt (red) and Lt (blue) bander stents. JP with serosanguinous output Extremities: extremities normal, atraumatic, no cyanosis or edema Skin: Skin color, texture, turgor normal. No rashes or lesions Neurologic: Grossly normal Incision/Wound: recent port and extraction sites c/d/i, no hernias.   Lab Results:  Recent Labs    08/13/18 0413 08/14/18 0442  HGB 11.6* 11.3*  HCT 35.1* 34.8*    BMET Recent Labs    08/13/18 0413 08/14/18 0442  NA 136 137  K 3.0* 3.2*  CL 100 101  CO2 28 27  GLUCOSE 151* 104*  BUN 14 16  CREATININE 1.20 1.26*  CALCIUM 8.5* 8.3*   PT/INR No results for input(s): LABPROT, INR in the last 72 hours. ABG No results for input(s): PHART, HCO3 in the last 72 hours.  Invalid input(s): PCO2, PO2  Studies/Results: No results found.  Anti-infectives: Anti-infectives (From admission, onward)   Start     Dose/Rate Route Frequency Ordered Stop   08/10/18 0600  piperacillin-tazobactam (ZOSYN) IVPB 3.375 g  Status:  Discontinued     3.375 g 100 mL/hr over 30 Minutes Intravenous 30 min pre-op 08/09/18 0826 08/09/18 0828   08/10/18 0600  piperacillin-tazobactam (ZOSYN) IVPB 3.375 g     3.375 g 100 mL/hr over 30 Minutes Intravenous 30 min pre-op 08/09/18 7494 08/10/18 0736      Assessment/Plan:  68 y.o. male with history of MIBC s/p NAC, now POD#4 s/p RC/IC.  - Continue pain control with PO/IV meds. - Advance to full liquid diet, continue IVFs at 75 mL/hr. - Tachycardia improved - Change metop to home PO dose - Discontinue entereg. - AM labs. - WOCN teaching complete - OOB, SCDs, IS, SQH.   Case Rob Bunting 08/14/2018   I have seen and examined the patient and agree with Dr. Madelin Rear plan.  Ileus resolving, ambulating well.  [place] 1/30.

## 2018-08-14 NOTE — Progress Notes (Signed)
Physical Therapy Treatment/Discharge Patient Details Name: Steve Gilbert MRN: [id number] DOB: 02-21-1951 Today's Date: 08/14/2018    History of Present Illness Pt s/p cystoprostatectomy 08/10/18 with post op ileus.  Pt with hx of bladder CA    PT Comments    Pt is independent/mod I with all gait and mobility.  He was able to demonstrate hallway gait with no assistive device without LOB and stairs simulating home entry.  PT to sign off.  Pt does not need any therapy follow up at discharge.  HR max 108 during gait.   Follow Up Recommendations  No PT follow up     Equipment Recommendations  None recommended by PT    Recommendations for Other Services   NA     Precautions / Restrictions Precautions Precautions: Fall;Other (comment) Precaution Comments: JP drain in place on L, Urostomy R LQ    Mobility  Bed Mobility               General bed mobility comments: Pt was OOB in the chair  Transfers Overall transfer level: Independent                  Ambulation/Gait Ambulation/Gait assistance: Independent Gait Distance (Feet): 430 Feet Assistive device: None Gait Pattern/deviations: WFL(Within Functional Limits)   Gait velocity interpretation: >2.62 ft/sec, indicative of community ambulatory     Stairs Stairs: Yes Stairs assistance: Modified independent (Device/Increase time) Stair Management: One rail Right;One rail Left;Alternating pattern;Forwards Number of Stairs: 3 General stair comments: mod I with rail             Cognition Arousal/Alertness: Awake/alert Behavior During Therapy: WFL for tasks assessed/performed Overall Cognitive Status: Within Functional Limits for tasks assessed                                           General Comments General comments (skin integrity, edema, etc.): HR up to 108 during gait      Pertinent Vitals/Pain Pain Assessment: No/denies pain           PT Goals (current goals can now be  found in the care plan section) Progress towards PT goals: Goals met/education completed, patient discharged from PT    Frequency    7X/week      PT Plan Current plan remains appropriate       AM-PAC PT "6 Clicks" Mobility   Outcome Measure  Help needed turning from your back to your side while in a flat bed without using bedrails?: None Help needed moving from lying on your back to sitting on the side of a flat bed without using bedrails?: None Help needed moving to and from a bed to a chair (including a wheelchair)?: None Help needed standing up from a chair using your arms (e.g., wheelchair or bedside chair)?: None Help needed to walk in hospital room?: None Help needed climbing 3-5 steps with a railing? : None 6 Click Score: 24    End of Session   Activity Tolerance: Patient tolerated treatment well Patient left: in chair;with call bell/phone within reach;with family/visitor present   PT Visit Diagnosis: Difficulty in walking, not elsewhere classified (R26.2)     Time: 8563-1497 PT Time Calculation (min) (ACUTE ONLY): 12 min  Charges:  $Gait Training: 8-22 mins                    Wells Guiles  Hervey Ard, PT, DPT  Acute Rehabilitation [phone] pager #[phone] office   08/14/2018, 3:12 PM

## 2018-08-15 LAB — BASIC METABOLIC PANEL
Anion gap: 8 (ref 5–15)
BUN: 16 mg/dL (ref 8–23)
CO2: 29 mmol/L (ref 22–32)
Calcium: 8.6 mg/dL — ABNORMAL LOW (ref 8.9–10.3)
Chloride: 102 mmol/L (ref 98–111)
Creatinine, Ser: 1.4 mg/dL — ABNORMAL HIGH (ref 0.61–1.24)
GFR calc Af Amer: 60 mL/min — ABNORMAL LOW (ref >60)
GFR calc non Af Amer: 52 mL/min — ABNORMAL LOW (ref >60)
Glucose, Bld: 97 mg/dL (ref 70–99)
Potassium: 3.1 mmol/L — ABNORMAL LOW (ref 3.5–5.1)
Sodium: 139 mmol/L (ref 135–145)

## 2018-08-15 LAB — HEMOGLOBIN AND HEMATOCRIT, BLOOD
HCT: 35.4 % — ABNORMAL LOW (ref 39.0–52.0)
Hemoglobin: 11.4 g/dL — ABNORMAL LOW (ref 13.0–17.0)

## 2018-08-15 MED ORDER — SULFAMETHOXAZOLE-TRIMETHOPRIM 800-160 MG PO TABS
1.0000 | ORAL_TABLET | Freq: Two times a day (BID) | ORAL | Status: DC
  Administered 2018-08-15 – 2018-08-16 (×3): 1 via ORAL
  Filled 2018-08-15 (×3): qty 1

## 2018-08-15 MED ORDER — POTASSIUM CHLORIDE 20 MEQ PO PACK
20.0000 meq | PACK | Freq: Three times a day (TID) | ORAL | Status: AC
  Administered 2018-08-15 (×3): 20 meq via ORAL
  Filled 2018-08-15 (×4): qty 1

## 2018-08-15 NOTE — Consult Note (Addendum)
[place] Nurse ostomy follow up: visit with patient and wife Stoma type/location: RUQ ileal conduit Stomal assessment/size: 1 and 1/2 inches round, edematous, 2 stents intact Peristomal assessment: pMARSI areas are healing, not healed Treatment options for stomal/peristomal skin: convex pouch, skin barrier ring, thin film transparent dressings  Output: clear yellow urine Ostomy pouching: 1pc.convex with skin barrier ring  Education provided:  Enrolled patient in [employer] Discharge program: Yes Education: Demonstrated pouch change (cutting new barrier, measuring stoma, cleaning peristomal skin and stoma, use of barrier ring) Education on use wick in stoma to keep skin dry with pouch change Education on urine characteristics (sediment, mucous) Discussed treatment of peristomal skin thin film transparent dressing until pMARSI heals. Provided patient with names of [employer] and [employer].  They will discuss further with [employer] representative.  Sent home with 6 pouches, 6 rings, 2 adapters, 2 leg straps, 1 bedside urinary drainage bag and 10 transparent film dressings.  Also sent with medical adhesive releaser spray and an ostomy appliance belt. Answered patient/family questions.  [place] nursing team will remain available to this patient, the nursing and medical teams.   Thanks, Maudie Flakes, MSN, RN, [place], Arther Abbott  Pager# [phone]

## 2018-08-15 NOTE — Progress Notes (Signed)
5 Days Post-Op   Subjective/Chief Complaint:  1 - Bladder Cancer - s/p robotic cystoprostatectomy with ICG sentinal + template lymphadenectomy 08/10/18 for invasive bladder cancer. Path pending. Admitted 1/23 for bowel prep/ stomal marking. Watched in stepdown POD 0.  2 - Post-op Ileus - bowel anastomosis as part of surgery 1/24. Received entereg peri-op. NPO with ice chips initially post-op. Now tolerating full liquids and consistently passing flatus with regular BMs.  3 - Disposition / Rehab - pt independent in all ADL's at baseline. PT evaluated 1/25 with no additional treatment needed. WOCN following.  NAEON. AFVSS. Tolerating diet. Denies nausea/vomiting. Ambulating. Labs WNL. Regular BMs.   Objective: Vital signs in last 24 hours: Temp:  [99.5 F (37.5 C)-100.2 F (37.9 C)] 99.5 F (37.5 C) (01/29 0520) Pulse Rate:  [86-109] 87 (01/29 0520) Resp:  [20] 20 (01/29 0520) BP: (124-130)/(80-88) 130/80 (01/29 0520) SpO2:  [94 %-97 %] 96 % (01/29 0953) Last BM Date: 08/14/18  Intake/Output from previous day: 01/28 0701 - 01/29 0700 In: 3472.5 [P.O.:1935; I.V.:1537.5] Out: 1970 [Urine:1450; Drains:520] Intake/Output this shift: Total I/O In: 600 [P.O.:600] Out: 245 [Drains:245]  General appearance: alert, cooperative and appears stated age Back: No CVA tenderness. Resp: non-labored on room air Cardio: mildly tachycardic GI: non-distended. RLQ urostomy pink / patent with Rt (red) and Lt (blue) bander stents. JP with serosanguinous output Extremities: extremities normal, atraumatic, no cyanosis or edema Skin: Skin color, texture, turgor normal. No rashes or lesions Neurologic: Grossly normal Incision/Wound: recent port and extraction sites c/d/i, no hernias.   Lab Results:  Recent Labs    08/14/18 0442 08/15/18 0408  HGB 11.3* 11.4*  HCT 34.8* 35.4*   BMET Recent Labs    08/14/18 0442 08/15/18 0408  NA 137 139  K 3.2* 3.1*  CL 101 102  CO2 27 29  GLUCOSE 104*  97  BUN 16 16  CREATININE 1.26* 1.40*  CALCIUM 8.3* 8.6*   PT/INR No results for input(s): LABPROT, INR in the last 72 hours. ABG No results for input(s): PHART, HCO3 in the last 72 hours.  Invalid input(s): PCO2, PO2  Studies/Results: No results found.  Anti-infectives: Anti-infectives (From admission, onward)   Start     Dose/Rate Route Frequency Ordered Stop   08/15/18 1000  sulfamethoxazole-trimethoprim (BACTRIM DS,SEPTRA DS) 800-160 MG per tablet 1 tablet     1 tablet Oral Every 12 hours 08/15/18 0749     08/10/18 0600  piperacillin-tazobactam (ZOSYN) IVPB 3.375 g  Status:  Discontinued     3.375 g 100 mL/hr over 30 Minutes Intravenous 30 min pre-op 08/09/18 0826 08/09/18 0828   08/10/18 0600  piperacillin-tazobactam (ZOSYN) IVPB 3.375 g     3.375 g 100 mL/hr over 30 Minutes Intravenous 30 min pre-op 08/09/18 8546 08/10/18 0736      Assessment/Plan:  68 y.o. male with history of MIBC s/p NAC, now POD#5 s/p RC/IC.  - Continue pain control with PO/IV meds. - Advance to regular diet, medlocked. - WOCN following - OOB, SCDs, IS, [facility] - Anticipate discharge tomorrow   Case Rob Bunting 08/15/2018

## 2018-08-16 MED ORDER — SULFAMETHOXAZOLE-TRIMETHOPRIM 800-160 MG PO TABS: 1.0000 | ORAL_TABLET | Freq: Two times a day (BID) | ORAL | 0 refills | Status: AC

## 2018-08-16 MED ORDER — HYDROCODONE-ACETAMINOPHEN 5-325 MG PO TABS: 2.0000 | ORAL_TABLET | Freq: Four times a day (QID) | ORAL | 0 refills | Status: DC | PRN

## 2018-08-16 NOTE — Care Management Important Message (Signed)
Important Message  Patient Details  Name: Steve Gilbert MRN: [id number] Date of Birth: [birth year]   Medicare Important Message Given:  Yes    Kerin Salen 08/16/2018, 10:38 AMImportant Message  Patient Details  Name: Steve Gilbert MRN: [id number] Date of Birth: Feb 19, 1951   Medicare Important Message Given:  Yes    Kerin Salen 08/16/2018, 10:38 AM

## 2018-08-16 NOTE — Discharge Summary (Signed)
[facility] Discharge Summary  Admit date: 08/09/2018  Discharge date and time: 08/16/18   Discharge to: Home  Discharge Service: Urology  Discharge Attending Physician: Alexis Frock, MD  Discharge  Diagnoses: <principal problem not specified>  Secondary Diagnosis: Active Problems:   Bladder cancer ([place])   OR Procedures: Procedure(s): ROBOT ASSISTED LAPAROSCOPIC RADICAL CYSTOPROSTATECTOMY BILATERAL PELVIC LYMPHADENECTOMY, ILEAL CONDUIT, RIGHT INGUINAL HERNIA REPAIR CYSTOSCOPY WITH STENT REMOVAL 08/10/2018   Ancillary Procedures: None   Discharge Day Services: The patient was seen and examined by the Urology team both in the morning and immediately prior to discharge.  Vital signs and laboratory values were stable and within normal limits.  The physical exam was benign and unchanged and all surgical wounds were examined.  Discharge instructions were explained and all questions answered.  Subjective  No acute events overnight. Pain Controlled. No fever or chills.  Objective Patient Vitals for the past 8 hrs:  BP Temp Temp src Pulse Resp SpO2  08/16/18 0602 115/82 98.6 F (37 C) Oral 82 18 97 %   Total I/O In: -  Out: 20 [Drains:20]  General Appearance:        No acute distress Lungs:                       Normal work of breathing on room air Heart:                                Regular rate and rhythm Abdomen:                         Soft, non-tender, non-distended, incisions c/d/i, RLQ ostomy pink/patent and outputting clear yellow urine with bilateral stents visible Extremities:                      Warm and well perfused   Hospital Course:  The patient underwent radical cystectomy with ileal conduit and bilateral PLND on 08/10/2018.  The patient tolerated the procedure well, was extubated in the OR, and afterwards was taken to the PACU for routine post-surgical care. When stable the patient was transferred to the floor.   The patient did well postoperatively.   The patient's diet was slowly advanced and at the time of discharge was tolerating a regular diet.  The patient was discharged home 6 Days Post-Op, at which point was tolerating a regular solid diet, was making adequate urine, having adequate pain control with P.O. pain medication, and could ambulate without difficulty. Home health nursing was arranged. The patient will follow up with [name] for post op check.   Condition at Discharge: Improved  Discharge Medications:  Allergies as of 08/16/2018      Reactions   Contrast Media [iodinated Diagnostic Agents] Itching, Rash      Medication List    STOP taking these medications   Saw Palmetto 450 MG Caps   silodosin 4 MG Caps capsule Commonly known as:  RAPAFLO   tamsulosin 0.4 MG Caps capsule Commonly known as:  FLOMAX     TAKE these medications   albuterol 108 (90 Base) MCG/ACT inhaler Commonly known as:  PROVENTIL HFA;VENTOLIN HFA Inhale 2 puffs into the lungs every 6 (six) hours as needed for wheezing or shortness of breath.   amLODipine 10 MG tablet Commonly known as:  NORVASC Take 10 mg by mouth daily.   aspirin EC 81 MG  tablet Take 81 mg by mouth daily.   atorvastatin 20 MG tablet Commonly known as:  LIPITOR Take 20 mg by mouth daily.   azelastine 0.1 % nasal spray Commonly known as:  ASTELIN Place 1 spray into both nostrils 2 (two) times daily as needed (congestion). Use in each nostril as directed   diphenhydrAMINE 25 MG tablet Commonly known as:  BENADRYL Take 25 mg by mouth daily as needed for allergies.   docusate sodium 100 MG capsule Commonly known as:  COLACE TAKE 1 CAPSULE BY MOUTH TWICE A DAY   Fluticasone-Salmeterol 250-50 MCG/DOSE Aepb Commonly known as:  ADVAIR Inhale 1 puff into the lungs 2 (two) times daily as needed (shortness of breath).   hydrochlorothiazide 12.5 MG tablet Commonly known as:  HYDRODIURIL Take 12.5 mg by mouth daily.   HYDROcodone-acetaminophen 5-325 MG tablet Commonly known  as:  NORCO Take 2 tablets by mouth every 6 (six) hours as needed for moderate pain.   lidocaine-prilocaine cream Commonly known as:  EMLA Apply 1 application topically as needed. What changed:  reasons to take this   metoprolol tartrate 25 MG tablet Commonly known as:  LOPRESSOR Take 1 tablet (25 mg total) by mouth 2 (two) times daily.   multivitamin with minerals tablet Take 1 tablet by mouth daily.   ondansetron 8 MG tablet Commonly known as:  ZOFRAN Take 1 tablet (8 mg total) by mouth every 8 (eight) hours as needed for nausea or vomiting.   pantoprazole 40 MG tablet Commonly known as:  PROTONIX Take 40 mg by mouth every evening.   prochlorperazine 10 MG tablet Commonly known as:  COMPAZINE Take 1 tablet (10 mg total) by mouth every 6 (six) hours as needed for nausea or vomiting.   sulfamethoxazole-trimethoprim 800-160 MG tablet Commonly known as:  BACTRIM DS,SEPTRA DS Take 1 tablet by mouth 2 (two) times daily for 7 days.   tiotropium 18 MCG inhalation capsule Commonly known as:  SPIRIVA Place 18 mcg into inhaler and inhale daily as needed (congestion).

## 2018-08-16 NOTE — Consult Note (Signed)
   [facility] Inpatient Consult   08/16/2018  AMIRR ACHORD 01/15/1951 [id number]    Spoke with Mr. Fitzhenry about potential [facility] Care Management needs prior to hospital discharge. Mr. Aro pleasantly declines [facility] Care Management services. He reports he has good follow up with his physicians and cannot think of anything he needs. Accepted [place] Management brochure with contact information to call should he change his mind.   Made inpatient RNCM aware that [place] Management services were declined.   Marthenia Rolling, MSN-Ed, RN,BSN [facility] Liaison [phone]

## 2018-08-16 NOTE — Discharge Summary (Signed)
Physician Discharge Summary  Patient ID: Steve Gilbert MRN: [id number] DOB/AGE: 68-Jan-1952 68 y.o.  Admit date: 08/09/2018 Discharge date: 08/16/2018  Admission Diagnoses: Bladder Cancer  Discharge Diagnoses:  Active Problems:   Bladder cancer [facility])   Discharged Condition: good  Hospital Course:   1 - Metastatic Bladder Cancer - s/p robotic cystoprostatectomy with ICG sentinal + template lymphadenectomy 08/10/18 for invasive bladder cancer. Pat [id number] with NEGATIVE margins. Admitted 1/23 for bowel prep/ stomal marking. Watched in stepdown POD 0. Trasferred to med-surg POD 1.   2 - Post-op Ileus - bowel anastomosis as part of surgery 1/24. Received entereg peri-op. NPO with ice chips initially post-op. Advanced to regular diet by time of discharge with resumed bowel function.   3 - Disposition / Rehab - pt independent in all ADL's at baseline. PT evaluated 1/25 with no additional treatment needed. HHRN arranged at discharge for continued new ostomy education that was start by hospital ostomy team.   By 08/16/18, the day of discharge, he is ambulatory, pain controlled on PO meds, resumed bowel function, and felt to be adequate for discharge.   Consults: case managment, osotmy RN, Physical therapy.   Significant Diagnostic Studies: labs: Hgb 11. Cr 1.4, JP Cr same as serum prior to removal.   Treatments: surgery: as per above.   Discharge Exam: Blood pressure 115/82, pulse 82, temperature 98.6 F (37 C), temperature source Oral, resp. rate 18, height 5\' 7"  (1.702 m), weight 97.5 kg, SpO2 97 %.  General appearance: alert, cooperative and appears stated age Back: No CVA tenderness. Resp: non-labored on room air Cardio: mildly tachycardic GI: non-distended. RLQ urostomy pink / patent with Rt (red) and Lt (blue) bander stents. JP with serosanguinous output that is non-foul, removed and dry dressing placed. GU: stable mild penoscrotal edema that is soft Extremities: extremities  normal, atraumatic, no cyanosis or edema Skin: Skin color, texture, turgor normal. No rashes or lesions Neurologic: Grossly normal Incision/Wound: recent port and extraction sites c/d/i, no hernias.    Disposition: Discharge disposition: 01-Home or Self Care       Discharge Instructions    Discharge instructions   Complete by:  As directed    Activity:  You are encouraged to ambulate frequently (about every hour during waking hours) to help prevent blood clots from forming in your legs or lungs.  However, you should not engage in any heavy lifting (> 10-15 lbs), strenuous activity, or straining. Diet: You should advance your diet as instructed by your physician.  It will be normal to have some bloating, nausea, and abdominal discomfort intermittently. Prescriptions:  Please continue taking antibiotics (Bactrim) as directed. You will be provided a prescription for pain medication to take as needed.  If your pain is not severe enough to require the prescription pain medication, you may take extra strength Tylenol instead which will have less side effects.  You should also take a prescribed stool softener to avoid straining with bowel movements as the prescription pain medication may constipate you. Incisions: You may start showering (but not soaking or bathing in water) the 2nd day after surgery and the incisions simply need to be patted dry after the shower.  No additional care is needed. What to call us about: You should call the office [phone]) if you develop fever > 101 or develop persistent vomiting.     Allergies as of 08/16/2018      Reactions   Contrast Media [iodinated Diagnostic Agents] Itching, Rash      Medication  List    STOP taking these medications   Saw Palmetto 450 MG Caps   silodosin 4 MG Caps capsule Commonly known as:  RAPAFLO   tamsulosin 0.4 MG Caps capsule Commonly known as:  FLOMAX     TAKE these medications   albuterol 108 (90 Base) MCG/ACT  inhaler Commonly known as:  PROVENTIL HFA;VENTOLIN HFA Inhale 2 puffs into the lungs every 6 (six) hours as needed for wheezing or shortness of breath.   amLODipine 10 MG tablet Commonly known as:  NORVASC Take 10 mg by mouth daily.   aspirin EC 81 MG tablet Take 81 mg by mouth daily.   atorvastatin 20 MG tablet Commonly known as:  LIPITOR Take 20 mg by mouth daily.   azelastine 0.1 % nasal spray Commonly known as:  ASTELIN Place 1 spray into both nostrils 2 (two) times daily as needed (congestion). Use in each nostril as directed   diphenhydrAMINE 25 MG tablet Commonly known as:  BENADRYL Take 25 mg by mouth daily as needed for allergies.   docusate sodium 100 MG capsule Commonly known as:  COLACE TAKE 1 CAPSULE BY MOUTH TWICE A DAY   Fluticasone-Salmeterol 250-50 MCG/DOSE Aepb Commonly known as:  ADVAIR Inhale 1 puff into the lungs 2 (two) times daily as needed (shortness of breath).   hydrochlorothiazide 12.5 MG tablet Commonly known as:  HYDRODIURIL Take 12.5 mg by mouth daily.   HYDROcodone-acetaminophen 5-325 MG tablet Commonly known as:  NORCO Take 2 tablets by mouth every 6 (six) hours as needed for moderate pain.   lidocaine-prilocaine cream Commonly known as:  EMLA Apply 1 application topically as needed. What changed:  reasons to take this   metoprolol tartrate 25 MG tablet Commonly known as:  LOPRESSOR Take 1 tablet (25 mg total) by mouth 2 (two) times daily.   multivitamin with minerals tablet Take 1 tablet by mouth daily.   ondansetron 8 MG tablet Commonly known as:  ZOFRAN Take 1 tablet (8 mg total) by mouth every 8 (eight) hours as needed for nausea or vomiting.   pantoprazole 40 MG tablet Commonly known as:  PROTONIX Take 40 mg by mouth every evening.   prochlorperazine 10 MG tablet Commonly known as:  COMPAZINE Take 1 tablet (10 mg total) by mouth every 6 (six) hours as needed for nausea or vomiting.   sulfamethoxazole-trimethoprim  800-160 MG tablet Commonly known as:  BACTRIM DS,SEPTRA DS Take 1 tablet by mouth 2 (two) times daily for 7 days.   tiotropium 18 MCG inhalation capsule Commonly known as:  SPIRIVA Place 18 mcg into inhaler and inhale daily as needed (congestion).      Follow-up Information    Alexis Frock, MD On 09/03/2018.   Specialty:  Urology Why:  at 10:15 for MD visit.  Contact information: [place] [address] [phone]           Signed: Alexis Frock 08/16/2018, 7:50 AM

## 2018-08-16 NOTE — Care Management Note (Signed)
Case Management Note  Patient Details  Name: Steve Gilbert MRN: [id number] Date of Birth: 1951-03-20  Subjective/Objective:                    Action/Plan:Pt discharging home with Ben [place] and his wife.   Expected Discharge Date:  08/16/18               Expected Discharge Plan:  [place]  In-House Referral:     Discharge planning Services  CM Consult  Post Acute Care Choice:    Choice offered to:  Patient  DME Arranged:    DME Agency:     HH Arranged:  RN [place] Agency:  [place]  Status of Service:  Completed, signed off  If discussed at [place] of Stay Meetings, dates discussed:    Additional CommentsPurcell Mouton, RN 08/16/2018, 2:25 PM

## 2018-08-17 DIAGNOSIS — K219 Gastro-esophageal reflux disease without esophagitis: Secondary | ICD-10-CM | POA: Diagnosis not present

## 2018-08-17 DIAGNOSIS — Z7951 Long term (current) use of inhaled steroids: Secondary | ICD-10-CM | POA: Diagnosis not present

## 2018-08-17 DIAGNOSIS — C679 Malignant neoplasm of bladder, unspecified: Secondary | ICD-10-CM | POA: Diagnosis not present

## 2018-08-17 DIAGNOSIS — Z7982 Long term (current) use of aspirin: Secondary | ICD-10-CM | POA: Diagnosis not present

## 2018-08-17 DIAGNOSIS — Z436 Encounter for attention to other artificial openings of urinary tract: Secondary | ICD-10-CM | POA: Diagnosis not present

## 2018-08-17 DIAGNOSIS — Z8701 Personal history of pneumonia (recurrent): Secondary | ICD-10-CM | POA: Diagnosis not present

## 2018-08-17 DIAGNOSIS — J449 Chronic obstructive pulmonary disease, unspecified: Secondary | ICD-10-CM | POA: Diagnosis not present

## 2018-08-17 DIAGNOSIS — Z87891 Personal history of nicotine dependence: Secondary | ICD-10-CM | POA: Diagnosis not present

## 2018-08-17 DIAGNOSIS — I10 Essential (primary) hypertension: Secondary | ICD-10-CM | POA: Diagnosis not present

## 2018-08-17 DIAGNOSIS — Z792 Long term (current) use of antibiotics: Secondary | ICD-10-CM | POA: Diagnosis not present

## 2018-08-17 DIAGNOSIS — E785 Hyperlipidemia, unspecified: Secondary | ICD-10-CM | POA: Diagnosis not present

## 2018-08-17 DIAGNOSIS — Z48815 Encounter for surgical aftercare following surgery on the digestive system: Secondary | ICD-10-CM | POA: Diagnosis not present

## 2018-08-17 DIAGNOSIS — Z483 Aftercare following surgery for neoplasm: Secondary | ICD-10-CM | POA: Diagnosis not present

## 2018-08-22 ENCOUNTER — Other Ambulatory Visit: Payer: Self-pay | Admitting: Internal Medicine

## 2018-08-22 DIAGNOSIS — Z483 Aftercare following surgery for neoplasm: Secondary | ICD-10-CM | POA: Diagnosis not present

## 2018-08-22 DIAGNOSIS — C679 Malignant neoplasm of bladder, unspecified: Secondary | ICD-10-CM | POA: Diagnosis not present

## 2018-08-22 DIAGNOSIS — Z8701 Personal history of pneumonia (recurrent): Secondary | ICD-10-CM | POA: Diagnosis not present

## 2018-08-22 DIAGNOSIS — Z7982 Long term (current) use of aspirin: Secondary | ICD-10-CM | POA: Diagnosis not present

## 2018-08-22 DIAGNOSIS — Z87891 Personal history of nicotine dependence: Secondary | ICD-10-CM | POA: Diagnosis not present

## 2018-08-22 DIAGNOSIS — Z48815 Encounter for surgical aftercare following surgery on the digestive system: Secondary | ICD-10-CM | POA: Diagnosis not present

## 2018-08-22 DIAGNOSIS — Z7951 Long term (current) use of inhaled steroids: Secondary | ICD-10-CM | POA: Diagnosis not present

## 2018-09-02 ENCOUNTER — Encounter: Payer: Self-pay | Admitting: Emergency Medicine

## 2018-09-02 ENCOUNTER — Other Ambulatory Visit: Payer: Self-pay

## 2018-09-02 ENCOUNTER — Emergency Department: Payer: PPO

## 2018-09-02 ENCOUNTER — Inpatient Hospital Stay
Admission: EM | Admit: 2018-09-02 | Discharge: 2018-09-06 | DRG: 871 | Disposition: A | Discharge status: Home Health | Payer: PPO | Attending: Internal Medicine | Admitting: Internal Medicine

## 2018-09-02 DIAGNOSIS — R0689 Other abnormalities of breathing: Secondary | ICD-10-CM | POA: Diagnosis not present

## 2018-09-02 DIAGNOSIS — R509 Fever, unspecified: Secondary | ICD-10-CM | POA: Diagnosis not present

## 2018-09-02 DIAGNOSIS — R110 Nausea: Secondary | ICD-10-CM | POA: Diagnosis not present

## 2018-09-02 DIAGNOSIS — E876 Hypokalemia: Secondary | ICD-10-CM | POA: Diagnosis not present

## 2018-09-02 DIAGNOSIS — R6520 Severe sepsis without septic shock: Secondary | ICD-10-CM | POA: Diagnosis present

## 2018-09-02 DIAGNOSIS — C675 Malignant neoplasm of bladder neck: Secondary | ICD-10-CM | POA: Diagnosis not present

## 2018-09-02 DIAGNOSIS — I10 Essential (primary) hypertension: Secondary | ICD-10-CM | POA: Diagnosis not present

## 2018-09-02 DIAGNOSIS — Z87891 Personal history of nicotine dependence: Secondary | ICD-10-CM | POA: Diagnosis not present

## 2018-09-02 DIAGNOSIS — N136 Pyonephrosis: Secondary | ICD-10-CM | POA: Diagnosis present

## 2018-09-02 DIAGNOSIS — Z452 Encounter for adjustment and management of vascular access device: Secondary | ICD-10-CM | POA: Diagnosis not present

## 2018-09-02 DIAGNOSIS — Z79899 Other long term (current) drug therapy: Secondary | ICD-10-CM

## 2018-09-02 DIAGNOSIS — Z906 Acquired absence of other parts of urinary tract: Secondary | ICD-10-CM

## 2018-09-02 DIAGNOSIS — Z7951 Long term (current) use of inhaled steroids: Secondary | ICD-10-CM

## 2018-09-02 DIAGNOSIS — A419 Sepsis, unspecified organism: Secondary | ICD-10-CM | POA: Diagnosis not present

## 2018-09-02 DIAGNOSIS — K219 Gastro-esophageal reflux disease without esophagitis: Secondary | ICD-10-CM | POA: Diagnosis present

## 2018-09-02 DIAGNOSIS — Z8701 Personal history of pneumonia (recurrent): Secondary | ICD-10-CM

## 2018-09-02 DIAGNOSIS — C679 Malignant neoplasm of bladder, unspecified: Secondary | ICD-10-CM | POA: Diagnosis not present

## 2018-09-02 DIAGNOSIS — R531 Weakness: Secondary | ICD-10-CM | POA: Diagnosis not present

## 2018-09-02 DIAGNOSIS — N179 Acute kidney failure, unspecified: Secondary | ICD-10-CM | POA: Diagnosis not present

## 2018-09-02 DIAGNOSIS — Z7982 Long term (current) use of aspirin: Secondary | ICD-10-CM

## 2018-09-02 DIAGNOSIS — Z9089 Acquired absence of other organs: Secondary | ICD-10-CM | POA: Diagnosis not present

## 2018-09-02 DIAGNOSIS — E785 Hyperlipidemia, unspecified: Secondary | ICD-10-CM | POA: Diagnosis present

## 2018-09-02 DIAGNOSIS — Z91041 Radiographic dye allergy status: Secondary | ICD-10-CM

## 2018-09-02 DIAGNOSIS — N1330 Unspecified hydronephrosis: Secondary | ICD-10-CM | POA: Diagnosis not present

## 2018-09-02 DIAGNOSIS — Z8551 Personal history of malignant neoplasm of bladder: Secondary | ICD-10-CM | POA: Diagnosis not present

## 2018-09-02 DIAGNOSIS — Z8249 Family history of ischemic heart disease and other diseases of the circulatory system: Secondary | ICD-10-CM | POA: Diagnosis not present

## 2018-09-02 DIAGNOSIS — J329 Chronic sinusitis, unspecified: Secondary | ICD-10-CM | POA: Diagnosis not present

## 2018-09-02 DIAGNOSIS — C678 Malignant neoplasm of overlapping sites of bladder: Secondary | ICD-10-CM | POA: Diagnosis not present

## 2018-09-02 DIAGNOSIS — N12 Tubulo-interstitial nephritis, not specified as acute or chronic: Secondary | ICD-10-CM | POA: Diagnosis not present

## 2018-09-02 DIAGNOSIS — B9561 Methicillin susceptible Staphylococcus aureus infection as the cause of diseases classified elsewhere: Secondary | ICD-10-CM | POA: Diagnosis not present

## 2018-09-02 DIAGNOSIS — R000 Tachycardia, unspecified: Secondary | ICD-10-CM | POA: Diagnosis not present

## 2018-09-02 DIAGNOSIS — Z9079 Acquired absence of other genital organ(s): Secondary | ICD-10-CM

## 2018-09-02 DIAGNOSIS — E872 Acidosis: Secondary | ICD-10-CM | POA: Diagnosis present

## 2018-09-02 DIAGNOSIS — R4182 Altered mental status, unspecified: Secondary | ICD-10-CM | POA: Diagnosis not present

## 2018-09-02 DIAGNOSIS — Z932 Ileostomy status: Secondary | ICD-10-CM | POA: Diagnosis not present

## 2018-09-02 DIAGNOSIS — J449 Chronic obstructive pulmonary disease, unspecified: Secondary | ICD-10-CM | POA: Diagnosis present

## 2018-09-02 DIAGNOSIS — A4101 Sepsis due to Methicillin susceptible Staphylococcus aureus: Secondary | ICD-10-CM | POA: Diagnosis not present

## 2018-09-02 DIAGNOSIS — R404 Transient alteration of awareness: Secondary | ICD-10-CM | POA: Diagnosis not present

## 2018-09-02 DIAGNOSIS — N132 Hydronephrosis with renal and ureteral calculous obstruction: Secondary | ICD-10-CM | POA: Diagnosis not present

## 2018-09-02 DIAGNOSIS — D638 Anemia in other chronic diseases classified elsewhere: Secondary | ICD-10-CM | POA: Diagnosis not present

## 2018-09-02 DIAGNOSIS — R7881 Bacteremia: Secondary | ICD-10-CM | POA: Diagnosis not present

## 2018-09-02 DIAGNOSIS — N390 Urinary tract infection, site not specified: Secondary | ICD-10-CM | POA: Diagnosis not present

## 2018-09-02 DIAGNOSIS — N19 Unspecified kidney failure: Secondary | ICD-10-CM | POA: Diagnosis not present

## 2018-09-02 DIAGNOSIS — G9341 Metabolic encephalopathy: Secondary | ICD-10-CM | POA: Diagnosis present

## 2018-09-02 DIAGNOSIS — R0902 Hypoxemia: Secondary | ICD-10-CM | POA: Diagnosis not present

## 2018-09-02 LAB — COMPREHENSIVE METABOLIC PANEL
ALBUMIN: 3.5 g/dL (ref 3.5–5.0)
ALT: 22 U/L (ref 0–44)
AST: 39 U/L (ref 15–41)
Alkaline Phosphatase: 64 U/L (ref 38–126)
Anion gap: 12 (ref 5–15)
BILIRUBIN TOTAL: 1.7 mg/dL — AB (ref 0.3–1.2)
BUN: 35 mg/dL — ABNORMAL HIGH (ref 8–23)
CO2: 27 mmol/L (ref 22–32)
Calcium: 8.2 mg/dL — ABNORMAL LOW (ref 8.9–10.3)
Chloride: 100 mmol/L (ref 98–111)
Creatinine, Ser: 2.56 mg/dL — ABNORMAL HIGH (ref 0.61–1.24)
GFR calc Af Amer: 29 mL/min — ABNORMAL LOW (ref >60)
GFR calc non Af Amer: 25 mL/min — ABNORMAL LOW (ref >60)
Glucose, Bld: 124 mg/dL — ABNORMAL HIGH (ref 70–99)
POTASSIUM: 3.1 mmol/L — AB (ref 3.5–5.1)
Sodium: 139 mmol/L (ref 135–145)
Total Protein: 6.6 g/dL (ref 6.5–8.1)

## 2018-09-02 LAB — CBC WITH DIFFERENTIAL/PLATELET
Abs Immature Granulocytes: 0.14 10*3/uL — ABNORMAL HIGH (ref 0.00–0.07)
Basophils Absolute: 0 10*3/uL (ref 0.0–0.1)
Basophils Relative: 0 %
Eosinophils Absolute: 0 10*3/uL (ref 0.0–0.5)
Eosinophils Relative: 0 %
HCT: 36.7 % — ABNORMAL LOW (ref 39.0–52.0)
Hemoglobin: 12.1 g/dL — ABNORMAL LOW (ref 13.0–17.0)
Immature Granulocytes: 1 %
Lymphocytes Relative: 3 %
Lymphs Abs: 0.3 10*3/uL — ABNORMAL LOW (ref 0.7–4.0)
MCH: 30.4 pg (ref 26.0–34.0)
MCHC: 33 g/dL (ref 30.0–36.0)
MCV: 92.2 fL (ref 80.0–100.0)
MONO ABS: 1.4 10*3/uL — AB (ref 0.1–1.0)
Monocytes Relative: 14 %
Neutro Abs: 8.2 10*3/uL — ABNORMAL HIGH (ref 1.7–7.7)
Neutrophils Relative %: 82 %
Platelets: 154 10*3/uL (ref 150–400)
RBC: 3.98 MIL/uL — ABNORMAL LOW (ref 4.22–5.81)
RDW: 12.4 % (ref 11.5–15.5)
SMEAR REVIEW: NORMAL
WBC: 10 10*3/uL (ref 4.0–10.5)
nRBC: 0 % (ref 0.0–0.2)

## 2018-09-02 LAB — PROTIME-INR
INR: 1.27
Prothrombin Time: 15.8 seconds — ABNORMAL HIGH (ref 11.4–15.2)

## 2018-09-02 LAB — INFLUENZA PANEL BY PCR (TYPE A & B)
Influenza A By PCR: NEGATIVE
Influenza B By PCR: NEGATIVE

## 2018-09-02 LAB — LACTIC ACID, PLASMA: Lactic Acid, Venous: 2.5 mmol/L (ref 0.5–1.9)

## 2018-09-02 MED ORDER — SODIUM CHLORIDE 0.9 % IV SOLN
2.0000 g | Freq: Once | INTRAVENOUS | Status: AC
  Administered 2018-09-02: 2 g via INTRAVENOUS
  Filled 2018-09-02: qty 2

## 2018-09-02 MED ORDER — VANCOMYCIN HCL IN DEXTROSE 1-5 GM/200ML-% IV SOLN: 1000.0000 mg | Freq: Once | INTRAVENOUS | Status: DC

## 2018-09-02 MED ORDER — SODIUM CHLORIDE 0.9 % IV BOLUS (SEPSIS)
1000.0000 mL | Freq: Once | INTRAVENOUS | Status: AC
  Administered 2018-09-02: 1000 mL via INTRAVENOUS

## 2018-09-02 MED ORDER — METRONIDAZOLE IN NACL 5-0.79 MG/ML-% IV SOLN
500.0000 mg | Freq: Three times a day (TID) | INTRAVENOUS | Status: DC
  Administered 2018-09-02: 500 mg via INTRAVENOUS
  Filled 2018-09-02: qty 100

## 2018-09-02 MED ORDER — PIPERACILLIN-TAZOBACTAM 3.375 G IVPB 30 MIN: 3.3750 g | Freq: Three times a day (TID) | INTRAVENOUS | Status: DC

## 2018-09-02 MED ORDER — VANCOMYCIN HCL 10 G IV SOLR
1750.0000 mg | Freq: Once | INTRAVENOUS | Status: AC
  Administered 2018-09-02: 1750 mg via INTRAVENOUS
  Filled 2018-09-02: qty 1750

## 2018-09-02 MED ORDER — METRONIDAZOLE IN NACL 5-0.79 MG/ML-% IV SOLN: 500.0000 mg | Freq: Once | INTRAVENOUS | Status: DC

## 2018-09-02 MED ORDER — ACETAMINOPHEN 650 MG RE SUPP
650.0000 mg | Freq: Once | RECTAL | Status: AC
  Administered 2018-09-02: 650 mg via RECTAL
  Filled 2018-09-02: qty 1

## 2018-09-02 MED ORDER — PIPERACILLIN-TAZOBACTAM 3.375 G IVPB
3.3750 g | Freq: Three times a day (TID) | INTRAVENOUS | Status: DC
  Administered 2018-09-03: 3.375 g via INTRAVENOUS
  Filled 2018-09-02: qty 50

## 2018-09-02 NOTE — ED Notes (Signed)
Cleaned pt

## 2018-09-02 NOTE — ED Provider Notes (Signed)
[facility] Emergency Department Provider Note   ____________________________________________   I have reviewed the triage vital signs and the nursing notes.   HISTORY  Chief Complaint Code Sepsis   History limited by and level 5 caveat due to: Altered Mental Status, history obtained from family   HPI Steve Gilbert is a 68 y.o. male who presents to the emergency department today via EMS because of concerns for altered mental status.  Patient family stated that this started today.  He did have one episode of vomiting earlier.  Today noticed patient became even more weak and less responsive.  Family did notice that the patient felt warm to touch.  Patient did undergo an ileal conduit surgery last month.  Was scheduled to have the stents removed tomorrow.  Per medical record review patient has a history of recent ileal conduit.   Past Medical History:  Diagnosis Date  . Acid reflux   . Allergy   . Cancer ([place])   . COPD (chronic obstructive pulmonary disease) ([place])   . High cholesterol   . Hyperlipidemia 04/03/2018  . Hypertension   . Pneumonia     Patient Active Problem List   Diagnosis Date Noted  . Bladder cancer ([place]) 08/10/2018  . Cancer of bladder neck ([place]) 04/16/2018  . Hydronephrosis of right kidney 04/04/2018  . Right inguinal hernia 04/03/2018  . Hyperlipidemia 04/03/2018  . Constipation 09/30/2015  . Vasomotor rhinitis 05/30/2014  . COPD, mild ([place]) 05/17/2014  . Essential hypertension 05/17/2014  . GERD without esophagitis 05/17/2014    Past Surgical History:  Procedure Laterality Date  . CYSTOSCOPY W/ URETERAL STENT REMOVAL Right 08/10/2018   Procedure: CYSTOSCOPY WITH STENT REMOVAL;  Surgeon: Alexis Frock, MD;  Location: [facility];  Service: Urology;  Laterality: Right;  . CYSTOSCOPY WITH BIOPSY N/A 04/04/2018   Procedure: CYSTOSCOPY WITH TURBT;  Surgeon: Hollice Espy, MD;  Location: [facility] OR[facility];  Service: Urology;  Laterality:  N/A;  . CYSTOSCOPY WITH URETEROSCOPY AND STENT PLACEMENT Right 04/04/2018   Procedure: CYSTOSCOPY WITH URETEROSCOPY AND STENT PLACEMENT;  Surgeon: Hollice Espy, MD;  Location: [facility] OR[facility];  Service: Urology;  Laterality: Right;  . NASAL SINUS SURGERY    . PORTA CATH INSERTION N/A 04/23/2018   Procedure: PORTA CATH INSERTION;  Surgeon: Algernon Huxley, MD;  Location: [facility];  Service: Cardiovascular;  Laterality: N/A;  . TRANSURETHRAL RESECTION OF PROSTATE N/A 04/04/2018   Procedure: TRANSURETHRAL RESECTION OF THE PROSTATE (TURP);  Surgeon: Hollice Espy, MD;  Location: [facility];  Service: Urology;  Laterality: N/A;  . URETERAL BIOPSY Right 04/04/2018   Procedure: URETERAL BIOPSY;  Surgeon: Hollice Espy, MD;  Location: [facility] OR[facility];  Service: Urology;  Laterality: Right;    Prior to Admission medications   Medication Sig Start Date End Date Taking? Authorizing Provider  albuterol (PROVENTIL HFA;VENTOLIN HFA) 108 (90 Base) MCG/ACT inhaler Inhale 2 puffs into the lungs every 6 (six) hours as needed for wheezing or shortness of breath. Patient not taking: Reported on 08/06/2018 05/09/18   Cammie Sickle, MD  amLODipine (NORVASC) 10 MG tablet Take 10 mg by mouth daily.    [provider]  aspirin EC 81 MG tablet Take 81 mg by mouth daily.    [provider]  atorvastatin (LIPITOR) 20 MG tablet Take 20 mg by mouth daily.    [provider]  azelastine (ASTELIN) 0.1 % nasal spray Place 1 spray into both nostrils 2 (two) times daily as needed (congestion). Use in  each nostril as directed     [provider]  diphenhydrAMINE (BENADRYL) 25 MG tablet Take 25 mg by mouth daily as needed for allergies.    [provider]  docusate sodium (COLACE) 100 MG capsule TAKE 1 CAPSULE BY MOUTH TWICE A DAY Patient not taking: Reported on 08/06/2018 05/04/18   Zara Council A, PA-C  Fluticasone-Salmeterol (ADVAIR) 250-50 MCG/DOSE AEPB Inhale 1 puff into the  lungs 2 (two) times daily as needed (shortness of breath).     [provider]  hydrochlorothiazide (HYDRODIURIL) 12.5 MG tablet Take 12.5 mg by mouth daily.  03/21/18   [provider]  HYDROcodone-acetaminophen (NORCO) 5-325 MG tablet Take 2 tablets by mouth every 6 (six) hours as needed for moderate pain. 08/16/18   Alexis Frock, MD  lidocaine-prilocaine (EMLA) cream Apply 1 application topically as needed. Patient taking differently: Apply 1 application topically as needed (port access).  04/17/18   Cammie Sickle, MD  metoprolol tartrate (LOPRESSOR) 25 MG tablet TAKE 1 TABLET(25 MG) BY MOUTH TWICE DAILY 08/22/18   Cammie Sickle, MD  Multiple Vitamins-Minerals (MULTIVITAMIN WITH MINERALS) tablet Take 1 tablet by mouth daily.    [provider]  ondansetron (ZOFRAN) 8 MG tablet Take 1 tablet (8 mg total) by mouth every 8 (eight) hours as needed for nausea or vomiting. Patient not taking: Reported on 07/04/2018 04/17/18   Cammie Sickle, MD  pantoprazole (PROTONIX) 40 MG tablet Take 40 mg by mouth every evening.     [provider]  prochlorperazine (COMPAZINE) 10 MG tablet Take 1 tablet (10 mg total) by mouth every 6 (six) hours as needed for nausea or vomiting. Patient not taking: Reported on 06/18/2018 04/17/18   Cammie Sickle, MD  tiotropium (SPIRIVA) 18 MCG inhalation capsule Place 18 mcg into inhaler and inhale daily as needed (congestion).     [provider]    Allergies Contrast media [iodinated diagnostic agents]  Family History  Problem Relation Age of Onset  . Diabetes Mother   . Heart disease Father   . Diabetes Brother   . Heart attack Brother   . Heart disease Brother     Social History Social History   Tobacco Use  . Smoking status: Former Smoker    Packs/day: 1.50    Years: 30.00    Pack years: 45.00    Types: Cigarettes    Last attempt to quit: 2009    Years since quitting: 11.1  . Smokeless  tobacco: Former Network engineer Use Topics  . Alcohol use: Yes    Alcohol/week: 7.0 standard drinks    Types: 5 Cans of beer, 2 Standard drinks or equivalent per week    Comment: ocassional   . Drug use: Not Currently    Review of Systems Unable to obtain secondary to AMS.  Fluorine ____________________________________________   PHYSICAL EXAM:  VITAL SIGNS: ED Triage Vitals  Enc Vitals Group     BP 09/02/18 2020 (!) 161/85     Pulse Rate 09/02/18 2020 (!) 140     Resp 09/02/18 2020 (!) 31     Temp 09/02/18 2020 (!) 103.1 F (39.5 C)     Temp Source 09/02/18 2051 Rectal     SpO2 09/02/18 2020 98 %     Weight 09/02/18 2023 208 lb 12.4 oz (94.7 kg)     Height 09/02/18 2023 5\' 7"  (1.702 m)     Head Circumference --      Peak Flow --  Constitutional: Awake, alert. Not oriented.  Eyes: Conjunctivae are normal.  ENT      Head: Normocephalic and atraumatic.      Nose: No congestion/rhinnorhea.      Mouth/Throat: Mucous membranes are moist.      Neck: No stridor. Hematological/Lymphatic/Immunilogical: No cervical lymphadenopathy. Cardiovascular: Tachycardic, regular rhythm.  No murmurs, rubs, or gallops.  Respiratory: Tachypnea.  Gastrointestinal: Soft. Ileostomy in place. No surrounding erythema.  Genitourinary: Deferred Musculoskeletal: Normal range of motion in all extremities. No lower extremity edema. Neurologic:  Altered mental status.  Appears to be moving all extremities. Mumbling.  Skin:  Skin is warm, dry and intact.   ____________________________________________    LABS (pertinent positives/negatives)  Influenza negative CMP k 3.1, na 139, cr 2.56 CBC wbc 10.0,hgb 12.1, plt 154 Lactic 2.5 ____________________________________________   EKG  I, Nance Pear, attending physician, personally viewed and interpreted this EKG  EKG Time: 2017 Rate: 136 Rhythm: sinus tachycardia Axis: normal Intervals: qtc 468 QRS: narrow ST changes: no st  elevation Impression: abnormal ekg   ____________________________________________    RADIOLOGY  CXR No acute findings  CT abd/pel Concern for urinary tract infection  ____________________________________________   PROCEDURES  Procedures  CRITICAL CARE Performed by: Nance Pear   Total critical care time: 40 minutes  Critical care time was exclusive of separately billable procedures and treating other patients.  Critical care was necessary to treat or prevent imminent or life-threatening deterioration.  Critical care was time spent personally by me on the following activities: development of treatment plan with patient and/or surrogate as well as nursing, discussions with consultants, evaluation of patient's response to treatment, examination of patient, obtaining history from patient or surrogate, ordering and performing treatments and interventions, ordering and review of laboratory studies, ordering and review of radiographic studies, pulse oximetry and re-evaluation of patient's condition.  ____________________________________________   INITIAL IMPRESSION / ASSESSMENT AND PLAN / ED COURSE  Pertinent labs & imaging results that were available during my care of the patient were reviewed by me and considered in my medical decision making (see chart for details).   Patient presented to the emergency department today because of concerns for altered mental status and fever.  Initial vital signs were concerning for significant tachycardia and fever.  Did raise concerns for sepsis.  Patient did have an elevated lactic acid level.  He was ordered for broad-spectrum antibiotics and IV fluids.  While here he did pull off his ostomy bag as well as pull it out he stands.  I did discuss with Dr. Junious Silk with urology.  Additionally discussed with hospitalist for admission given infection.    ____________________________________________   FINAL CLINICAL IMPRESSION(S) / ED  DIAGNOSES  Final diagnoses:  Sepsis, due to unspecified organism, unspecified whether acute organ dysfunction present ([place])  Altered mental status, unspecified altered mental status type     Note: This dictation was prepared with Dragon dictation. Any transcriptional errors that result from this process are unintentional     Nance Pear, MD 09/03/18 [phone]

## 2018-09-02 NOTE — Consult Note (Signed)
Consult: sepsis, post-op cystectomy ileal conduit, hydronephrosis  Requested by: Dr. Nance Pear   History of Present Illness: [name] a 68 yo WM who underwent robotic cystoprostatectomy with ICG sentinal + template lymphadenectomy and ileal conduit 08/10/18 for for Stage III High Grade Muscle Invasive BLadder Cancer with Right Malignant Hydronephrosis. He underwent neoadjuvant chemo with 4 cycles dose-dense MVAC by Dr. Rogue Bussing at [facility]. Final path TisN1. He was on po Bactrim through 08/23/2018.   He presented tonight with fever to 103, altered mental status and somnolence noted by his family over the past few days. The patient was confused and pulled out his bander stents on the way or in CT, but was supposed to be seen tomorrow in the office for stent removal. CT A/P was done which shows mild to moderate bilateral hydronephrosis one would expect from stent/ileal conduit surgery as well as left PN stranding likely pyelonephritis. There was no fluid collections/abscess or free air. One of the stents was still partially seen in the conduit. No dilated large or small bowel loops. His wbc was 10, hgb stable at 12.1 but Cr bumped up to 2.5 from post-op 1.2 - 1.4. He has responded to bolus with UOP visible at ileostomy and heart rate coming down.   Pt's wife contributed to the history.   Past Medical History:  Diagnosis Date  . Acid reflux   . Allergy   . Cancer ([place])   . COPD (chronic obstructive pulmonary disease) ([place])   . High cholesterol   . Hyperlipidemia 04/03/2018  . Hypertension   . Pneumonia    Past Surgical History:  Procedure Laterality Date  . CYSTOSCOPY W/ URETERAL STENT REMOVAL Right 08/10/2018   Procedure: CYSTOSCOPY WITH STENT REMOVAL;  Surgeon: Alexis Frock, MD;  [facility]: [facility];  Service: Urology;  Laterality: Right;  . CYSTOSCOPY WITH BIOPSY N/A 04/04/2018   Procedure: CYSTOSCOPY WITH TURBT;  Surgeon: Hollice Espy, MD;  Location: [facility];  Service:  Urology;  Laterality: N/A;  . CYSTOSCOPY WITH URETEROSCOPY AND STENT PLACEMENT Right 04/04/2018   Procedure: CYSTOSCOPY WITH URETEROSCOPY AND STENT PLACEMENT;  Surgeon: Hollice Espy, MD;  Location: [facility];  Service: Urology;  Laterality: Right;  . NASAL SINUS SURGERY    . PORTA CATH INSERTION N/A 04/23/2018   Procedure: PORTA CATH INSERTION;  Surgeon: Algernon Huxley, MD;  Location: [facility];  Service: Cardiovascular;  Laterality: N/A;  . TRANSURETHRAL RESECTION OF PROSTATE N/A 04/04/2018   Procedure: TRANSURETHRAL RESECTION OF THE PROSTATE (TURP);  Surgeon: Hollice Espy, MD;  Location: [facility];  Service: Urology;  Laterality: N/A;  . URETERAL BIOPSY Right 04/04/2018   Procedure: URETERAL BIOPSY;  Surgeon: Hollice Espy, MD;  Location: [facility];  Service: Urology;  Laterality: Right;    Home Medications:  (Not in a hospital admission)  Allergies:  Allergies  Allergen Reactions  . Contrast Media [Iodinated Diagnostic Agents] Itching and Rash    Family History  Problem Relation Age of Onset  . Diabetes Mother   . Heart disease Father   . Diabetes Brother   . Heart attack Brother   . Heart disease Brother    Social History:  reports that he quit smoking about 11 years ago. His smoking use included cigarettes. He has a 45.00 pack-year smoking history. He has quit using smokeless tobacco. He reports current alcohol use of about 7.0 standard drinks of alcohol per week. He reports previous drug use.  ROS: A complete review of systems was performed.  All systems are negative except for pertinent findings as noted. Review of Systems  Unable to perform ROS: Acuity of condition     Physical Exam:  Vital signs in last 24 hours: Temp:  [103.1 F (39.5 C)-105.1 F (40.6 C)] 103.1 F (39.5 C) (02/16 2253) Pulse Rate:  [122-140] 132 (02/16 2300) Resp:  [18-31] 18 (02/16 2300) BP: (106-161)/(82-92) 106/92 (02/16 2300) SpO2:  [93 %-98 %] 98 % (02/16 2300) Weight:  [93.9  kg-94.7 kg] 93.9 kg (02/16 2025) General:  No acute distress, sleepy but arousable HEENT: Normocephalic, atraumatic Cardiovascular: Regular rate and rhythm Lungs: Regular rate and effort Abdomen: Soft, nontender, nondistended, no abdominal masses, ileostomy pink and viable, visibly emanating good UOP and his gown is soaked (pt took his ostomy bag off too), incision C/D/I with one small granulating area.  Back: No CVA tenderness Extremities: No edema Neurologic: Grossly intact  Laboratory Data:  Results for orders placed or performed during the hospital encounter of 09/02/18 (from the past 24 hour(s))  Comprehensive metabolic panel     Status: Abnormal   Collection Time: 09/02/18  8:42 PM  Result Value Ref Range   Sodium 139 135 - 145 mmol/L   Potassium 3.1 (L) 3.5 - 5.1 mmol/L   Chloride 100 98 - 111 mmol/L   CO2 27 22 - 32 mmol/L   Glucose, Bld 124 (H) 70 - 99 mg/dL   BUN 35 (H) 8 - 23 mg/dL   Creatinine, Ser 2.56 (H) 0.61 - 1.24 mg/dL   Calcium 8.2 (L) 8.9 - 10.3 mg/dL   Total Protein 6.6 6.5 - 8.1 g/dL   Albumin 3.5 3.5 - 5.0 g/dL   AST 39 15 - 41 U/L   ALT 22 0 - 44 U/L   Alkaline Phosphatase 64 38 - 126 U/L   Total Bilirubin 1.7 (H) 0.3 - 1.2 mg/dL   GFR calc non Af Amer 25 (L) >60 mL/min   GFR calc Af Amer 29 (L) >60 mL/min   Anion gap 12 5 - 15  Lactic acid, plasma     Status: Abnormal   Collection Time: 09/02/18  8:42 PM  Result Value Ref Range   Lactic Acid, Venous 2.5 (HH) 0.5 - 1.9 mmol/L  CBC with Differential     Status: Abnormal   Collection Time: 09/02/18  8:42 PM  Result Value Ref Range   WBC 10.0 4.0 - 10.5 K/uL   RBC 3.98 (L) 4.22 - 5.81 MIL/uL   Hemoglobin 12.1 (L) 13.0 - 17.0 g/dL   HCT 36.7 (L) 39.0 - 52.0 %   MCV 92.2 80.0 - 100.0 fL   MCH 30.4 26.0 - 34.0 pg   MCHC 33.0 30.0 - 36.0 g/dL   RDW 12.4 11.5 - 15.5 %   Platelets 154 150 - 400 K/uL   nRBC 0.0 0.0 - 0.2 %   Neutrophils Relative % 82 %   Neutro Abs 8.2 (H) 1.7 - 7.7 K/uL   Lymphocytes  Relative 3 %   Lymphs Abs 0.3 (L) 0.7 - 4.0 K/uL   Monocytes Relative 14 %   Monocytes Absolute 1.4 (H) 0.1 - 1.0 K/uL   Eosinophils Relative 0 %   Eosinophils Absolute 0.0 0.0 - 0.5 K/uL   Basophils Relative 0 %   Basophils Absolute 0.0 0.0 - 0.1 K/uL   WBC Morphology VACUOLATED NEUTROPHILS    RBC Morphology MORPHOLOGY UNREMARKABLE    Smear Review Normal platelet morphology    Immature Granulocytes 1 %   Abs Immature  Granulocytes 0.14 (H) 0.00 - 0.07 K/uL  Protime-INR     Status: Abnormal   Collection Time: 09/02/18  8:42 PM  Result Value Ref Range   Prothrombin Time 15.8 (H) 11.4 - 15.2 seconds   INR 1.27   Influenza panel by PCR (type A & B)     Status: None   Collection Time: 09/02/18  8:43 PM  Result Value Ref Range   Influenza A By PCR NEGATIVE NEGATIVE   Influenza B By PCR NEGATIVE NEGATIVE   No results found for this or any previous visit (from the past 240 hour(s)). Creatinine: Recent Labs    09/02/18 2042  CREATININE 2.56*    Impression/Assessment/plan:  1) sepsis - appreciate excellent ED and hospitalist care. Agree with fluid rescucitation, IV abx and blood/urine cultures. No fluid collections or dilated bowel to suggest anything other than urosepsis and CXR was clear.   2) bilateral hydronephrosis - expected with this type of sugery. He is visibly making urine. Do not feel he needs nephrostomy tubes at this point. Will follow UOP and Cr.    Festus Aloe 09/02/2018, 11:35 PM

## 2018-09-02 NOTE — Progress Notes (Signed)
Pharmacy Antibiotic Note  Steve Gilbert is a 68 y.o. male admitted on 09/02/2018 with Urosepsis.  Pharmacy has been consulted for Zosyn dosing.  Plan: Zosyn 3.375g IV q8h (4 hour infusion).  Height: 5\' 7"  (170.2 cm) Weight: 207 lb 0.2 oz (93.9 kg) IBW/kg (Calculated) : 66.1  Temp (24hrs), Avg:103.8 F (39.9 C), Min:103.1 F (39.5 C), Max:105.1 F (40.6 C)  Recent Labs  Lab 09/02/18 2042  WBC 10.0  CREATININE 2.56*  LATICACIDVEN 2.5*    Estimated Creatinine Clearance: 30.6 mL/min (A) (by C-G formula based on SCr of 2.56 mg/dL (H)).    Allergies  Allergen Reactions  . Contrast Media [Iodinated Diagnostic Agents] Itching and Rash    Thank you for allowing pharmacy to be a part of this patient's care.  Tobie Lords, PharmD, BCPS Clinical Pharmacist 09/02/2018

## 2018-09-02 NOTE — ED Triage Notes (Signed)
Pt had bladder removed jan 14th for bladder cancer. Has been sleeping often the last few days and when family checked today his temp was 103. Altered mental status.

## 2018-09-02 NOTE — ED Notes (Signed)
ED TO INPATIENT HANDOFF REPORT  Name/Age/Gender Steve Gilbert 68 y.o. male  Code Status Code Status History    Date Active Date Inactive Code Status Order ID Comments User Context   08/10/2018 1754 08/16/2018 1750 Full Code [id number]  Lattie Corns Inpatient   04/04/2018 1935 04/05/2018 1945 Full Code [id number]  Hollice Espy, MD Inpatient    Advance Directive Documentation     Most Recent Value  Type of Advance Directive  Healthcare Power of Attorney  Pre-existing out of facility DNR order (yellow form or pink MOST form)  -  "MOST" Form in Place?  -      Home/SNF/Other home  Chief Complaint possible sepsis  Level of Care/Admitting Diagnosis ED Disposition    ED Disposition Condition [place]: [place] [[id number]]  Level of Care: Stepdown [14]  Diagnosis: Severe sepsis [facility]) [[id number]]  Admitting Physician: Arta Silence [[id number]]  Attending Physician: Arta Silence [[id number]]  Estimated length of stay: past midnight tomorrow  Certification:: I certify this patient will need inpatient services for at least 2 midnights  PT Class (Do Not Modify): Inpatient [101]  PT Acc Code (Do Not Modify): Private [1]       Medical History Past Medical History:  Diagnosis Date  . Acid reflux   . Allergy   . Cancer ([place])   . COPD (chronic obstructive pulmonary disease) ([place])   . High cholesterol   . Hyperlipidemia 04/03/2018  . Hypertension   . Pneumonia     Allergies Allergies  Allergen Reactions  . Contrast Media [Iodinated Diagnostic Agents] Itching and Rash    IV Location/Drains/Wounds Patient Lines/Drains/Airways Status   Active Line/Drains/Airways    Name:   Placement date:   Placement time:   Site:   Days:   Implanted Port 04/25/18 Right Chest   04/25/18    -    Chest   130   Peripheral IV 08/09/18 Right Hand   08/09/18    1730    Hand   24   Peripheral IV 09/02/18 Right Forearm   09/02/18    2048     Forearm   less than 1   Peripheral IV 09/02/18 Left Wrist   09/02/18    2049    Wrist   less than 1   Urostomy Ileal conduit RUQ   08/10/18    -    RUQ   23   Ureteral Drain/Stent Left ureter 7 Fr.   08/10/18    1240    Left ureter   23   Ureteral Drain/Stent Right ureter 7 Fr.   08/10/18    1300    Right ureter   23   Incision (Closed) 08/10/18 N/A Other (Comment)   08/10/18    1346     23   Incision (Closed) 08/10/18 Abdomen Other (Comment)   08/10/18    1346     23   Incision - 4 Ports Abdomen 1: Right;Upper;Lateral 2: Right;Mid;Lateral 3: Left;Mid;Lateral 4: Left;Lateral   08/10/18    -     23          Labs/Imaging Results for orders placed or performed during the hospital encounter of 09/02/18 (from the past 48 hour(s))  Comprehensive metabolic panel     Status: Abnormal   Collection Time: 09/02/18  8:42 PM  Result Value Ref Range   Sodium 139 135 - 145 mmol/L   Potassium 3.1 (L) 3.5 - 5.1 mmol/L  Chloride 100 98 - 111 mmol/L   CO2 27 22 - 32 mmol/L   Glucose, Bld 124 (H) 70 - 99 mg/dL   BUN 35 (H) 8 - 23 mg/dL   Creatinine, Ser 2.56 (H) 0.61 - 1.24 mg/dL   Calcium 8.2 (L) 8.9 - 10.3 mg/dL   Total Protein 6.6 6.5 - 8.1 g/dL   Albumin 3.5 3.5 - 5.0 g/dL   AST 39 15 - 41 U/L   ALT 22 0 - 44 U/L   Alkaline Phosphatase 64 38 - 126 U/L   Total Bilirubin 1.7 (H) 0.3 - 1.2 mg/dL   GFR calc non Af Amer 25 (L) >60 mL/min   GFR calc Af Amer 29 (L) >60 mL/min   Anion gap 12 5 - 15    Comment: Performed at [facility], [address] [place], [place] [address]  Lactic acid, plasma     Status: Abnormal   Collection Time: 09/02/18  8:42 PM  Result Value Ref Range   Lactic Acid, Venous 2.5 (HH) 0.5 - 1.9 mmol/L    Comment: CRITICAL RESULT CALLED TO, READ BACK BY AND VERIFIED WITH SUSAN NEAL RN AT 2115 09/02/2018. MSS Performed at [facility], [address] [place], [place] [address]   CBC with Differential     Status: Abnormal   Collection Time: 09/02/18   8:42 PM  Result Value Ref Range   WBC 10.0 4.0 - 10.5 K/uL   RBC 3.98 (L) 4.22 - 5.81 MIL/uL   Hemoglobin 12.1 (L) 13.0 - 17.0 g/dL   HCT 36.7 (L) 39.0 - 52.0 %   MCV 92.2 80.0 - 100.0 fL   MCH 30.4 26.0 - 34.0 pg   MCHC 33.0 30.0 - 36.0 g/dL   RDW 12.4 11.5 - 15.5 %   Platelets 154 150 - 400 K/uL   nRBC 0.0 0.0 - 0.2 %   Neutrophils Relative % 82 %   Neutro Abs 8.2 (H) 1.7 - 7.7 K/uL   Lymphocytes Relative 3 %   Lymphs Abs 0.3 (L) 0.7 - 4.0 K/uL   Monocytes Relative 14 %   Monocytes Absolute 1.4 (H) 0.1 - 1.0 K/uL   Eosinophils Relative 0 %   Eosinophils Absolute 0.0 0.0 - 0.5 K/uL   Basophils Relative 0 %   Basophils Absolute 0.0 0.0 - 0.1 K/uL   WBC Morphology VACUOLATED NEUTROPHILS    RBC Morphology MORPHOLOGY UNREMARKABLE    Smear Review Normal platelet morphology    Immature Granulocytes 1 %   Abs Immature Granulocytes 0.14 (H) 0.00 - 0.07 K/uL    Comment: Performed at [facility], [address] [place], [place] [address]  Protime-INR     Status: Abnormal   Collection Time: 09/02/18  8:42 PM  Result Value Ref Range   Prothrombin Time 15.8 (H) 11.4 - 15.2 seconds   INR 1.27     Comment: Performed at [facility], [address], [place],  [address]  Influenza panel by PCR (type A & B)     Status: None   Collection Time: 09/02/18  8:43 PM  Result Value Ref Range   Influenza A By PCR NEGATIVE NEGATIVE   Influenza B By PCR NEGATIVE NEGATIVE    Comment: (NOTE) The Xpert Xpress Flu assay is intended as an aid in the diagnosis of  influenza and should not be used as a sole basis for treatment.  This  assay is FDA approved for nasopharyngeal swab specimens only. Nasal  washings and aspirates are unacceptable for Xpert Xpress Flu testing. Performed at [facility], [place], [place] [address]    Ct Abdomen Pelvis Wo Contrast  Result Date: 09/02/2018 CLINICAL DATA:  Bladder removed on January 14th for bladder cancer.  Now with fever and vomiting. Altered mental status. EXAM: CT ABDOMEN AND PELVIS WITHOUT CONTRAST TECHNIQUE: Multidetector CT imaging of the abdomen and pelvis was performed following the standard protocol without IV contrast. COMPARISON:  CT abdomen dated 07/06/2018. FINDINGS: Lower chest: No acute abnormality. Hepatobiliary: No focal liver abnormality is seen. No gallstones, gallbladder wall thickening, or biliary dilatation. Pancreas: Pancreas is unremarkable. Spleen: Normal in size without focal abnormality. Adrenals/Urinary Tract: Status post bladder resection and ileal conduit creation. Catheter in place at the ileal conduit outlet at the RIGHT lower abdominal wall. Bilateral hydronephrosis and hydroureter, mild to moderate in degree. LEFT perinephric inflammation/fluid stranding. Stomach/Bowel: No dilated large or small bowel loops. Stomach appears normal. Appendix is normal. Vascular/Lymphatic: Aortic atherosclerosis. No enlarged lymph nodes seen. Reproductive: Apparent prostatectomy with the aforementioned bladder resection. Other: No abscess collection seen. No free intraperitoneal air. Expected postsurgical changes within the subcutaneous soft tissues of the lower abdomen and pelvis. Musculoskeletal: No acute or suspicious osseous finding. IMPRESSION: 1. Status post bladder resection and ileal conduit creation. Catheter in place at the ileal conduit outlet at the RIGHT lower abdominal wall. 2. Prominent LEFT perinephric inflammation/fluid stranding. Findings are suspicious for ascending urinary tract infection. Recommend correlation with urinalysis. 3. Bilateral hydronephrosis and hydroureter, mild to moderate in degree. This may be expected given the recent ileal conduit creation. Normal urine output? 4. No abscess collection seen. No free intraperitoneal air. No bowel obstruction. Aortic Atherosclerosis (ICD10-I70.0). Electronically Signed   By: Franki Cabot M.D.   On: 09/02/2018 23:07   Dg Chest Port  1 View  Result Date: 09/02/2018 CLINICAL DATA:  Bladder removed on January 14th for bladder cancer. Fever and altered mental status. EXAM: PORTABLE CHEST 1 VIEW COMPARISON:  Chest x-ray dated 05/15/2018. FINDINGS: RIGHT IJ Port-A-Cath appears stable in position with tip at the level of the upper SVC. Heart size and mediastinal contours are stable. Lungs are clear. No pleural effusions seen. Osseous structures about the chest are unremarkable. IMPRESSION: No active disease.  No evidence of pneumonia. Electronically Signed   By: Franki Cabot M.D.   On: 09/02/2018 21:31    Pending Labs Unresulted Labs (From admission, onward)    Start     Ordered   09/04/18 0500  Procalcitonin  Daily,   STAT    Question:  Specimen collection method  Answer:  Unit=Unit collect   09/02/18 2350   09/03/18 0500  Lactic acid, plasma  Tomorrow morning,   STAT    Question:  Specimen collection method  Answer:  Unit=Unit collect   09/02/18 2350   09/02/18 2353  RPR  Add-on,   AD     09/02/18 2352   09/02/18 2353  Rapid HIV screen (HIV 1/2 Ab+Ag) ([place] Only)  Add-on,   AD     09/02/18 2352   09/02/18 2352  TSH  Add-on,   AD     09/02/18 2352   09/02/18 2352  Vitamin B12  Add-on,   AD     09/02/18 2352   09/02/18 2352  Folate  Add-on,   AD     09/02/18 2352   09/02/18 2352  Ammonia  Once,   STAT     09/02/18 2352   09/02/18 2351  Procalcitonin - Baseline  Add-on,   AD    Question:  Specimen collection method  Answer:  Unit=Unit collect   09/02/18 2350   09/02/18 2350  Magnesium  Add-on,   AD    Question:  Specimen collection method  Answer:  Unit=Unit collect   09/02/18 2350   09/02/18 2350  Phosphorus  Add-on,   AD    Question:  Specimen collection method  Answer:  Unit=Unit collect   09/02/18 2350   09/02/18 2350  Calcium, ionized  Once,   STAT    Question:  Specimen collection method  Answer:  Unit=Unit collect   09/02/18 2350   09/02/18 2037  Culture, blood (Routine x 2)  BLOOD CULTURE X 2,   STAT      09/02/18 2037   09/02/18 2037  Urinalysis, Complete w Microscopic  ONCE - STAT,   STAT     09/02/18 2037   Signed and Held  Basic metabolic panel  Tomorrow morning,   R     Signed and Held          Vitals/Pain Today's Vitals   09/02/18 2130 09/02/18 2200 09/02/18 2253 09/02/18 2300  BP: (!) 148/82 (!) 147/86  (!) 106/92  Pulse: (!) 133 (!) 122  (!) 132  Resp: 18 (!) 23  18  Temp:   (!) 103.1 F (39.5 C)   TempSrc:   Oral   SpO2: 96% 93%  98%  Weight:      Height:      PainSc:        Isolation Precautions No active isolations  Medications Medications  vancomycin (VANCOCIN) 1,750 mg in sodium chloride 0.9 % 500 mL IVPB (1,750 mg Intravenous New Bag/Given 09/02/18 2302)  piperacillin-tazobactam (ZOSYN) IVPB 3.375 g (has no administration in time range)  acetaminophen (TYLENOL) suppository 650 mg (650 mg Rectal Given 09/02/18 2050)  sodium chloride 0.9 % bolus 1,000 mL (1,000 mLs Intravenous New Bag/Given 09/02/18 2128)    And  sodium chloride 0.9 % bolus 1,000 mL (1,000 mLs Intravenous New Bag/Given 09/02/18 2225)    And  sodium chloride 0.9 % bolus 1,000 mL (0 mLs Intravenous Stopped 09/02/18 2224)  ceFEPIme (MAXIPIME) 2 g in sodium chloride 0.9 % 100 mL IVPB (0 g Intravenous Stopped 09/02/18 2224)    Mobility  walks

## 2018-09-03 ENCOUNTER — Inpatient Hospital Stay: Payer: PPO

## 2018-09-03 DIAGNOSIS — B9561 Methicillin susceptible Staphylococcus aureus infection as the cause of diseases classified elsewhere: Secondary | ICD-10-CM

## 2018-09-03 DIAGNOSIS — A419 Sepsis, unspecified organism: Secondary | ICD-10-CM

## 2018-09-03 DIAGNOSIS — Z9079 Acquired absence of other genital organ(s): Secondary | ICD-10-CM

## 2018-09-03 DIAGNOSIS — N1330 Unspecified hydronephrosis: Secondary | ICD-10-CM

## 2018-09-03 DIAGNOSIS — R509 Fever, unspecified: Secondary | ICD-10-CM

## 2018-09-03 DIAGNOSIS — Z91041 Radiographic dye allergy status: Secondary | ICD-10-CM

## 2018-09-03 DIAGNOSIS — C678 Malignant neoplasm of overlapping sites of bladder: Secondary | ICD-10-CM

## 2018-09-03 DIAGNOSIS — R6520 Severe sepsis without septic shock: Secondary | ICD-10-CM

## 2018-09-03 DIAGNOSIS — Z9089 Acquired absence of other organs: Secondary | ICD-10-CM

## 2018-09-03 DIAGNOSIS — Z932 Ileostomy status: Secondary | ICD-10-CM

## 2018-09-03 DIAGNOSIS — J329 Chronic sinusitis, unspecified: Secondary | ICD-10-CM

## 2018-09-03 DIAGNOSIS — N179 Acute kidney failure, unspecified: Secondary | ICD-10-CM

## 2018-09-03 DIAGNOSIS — C679 Malignant neoplasm of bladder, unspecified: Secondary | ICD-10-CM

## 2018-09-03 DIAGNOSIS — Z87891 Personal history of nicotine dependence: Secondary | ICD-10-CM

## 2018-09-03 DIAGNOSIS — R7881 Bacteremia: Secondary | ICD-10-CM

## 2018-09-03 DIAGNOSIS — N12 Tubulo-interstitial nephritis, not specified as acute or chronic: Secondary | ICD-10-CM

## 2018-09-03 LAB — BLOOD CULTURE ID PANEL (REFLEXED)
Acinetobacter baumannii: NOT DETECTED
CANDIDA GLABRATA: NOT DETECTED
Candida albicans: NOT DETECTED
Candida krusei: NOT DETECTED
Candida parapsilosis: NOT DETECTED
Candida tropicalis: NOT DETECTED
Enterobacter cloacae complex: NOT DETECTED
Enterobacteriaceae species: NOT DETECTED
Enterococcus species: NOT DETECTED
Escherichia coli: NOT DETECTED
Haemophilus influenzae: NOT DETECTED
KLEBSIELLA PNEUMONIAE: NOT DETECTED
Klebsiella oxytoca: NOT DETECTED
Listeria monocytogenes: NOT DETECTED
Methicillin resistance: NOT DETECTED
NEISSERIA MENINGITIDIS: NOT DETECTED
Proteus species: NOT DETECTED
Pseudomonas aeruginosa: NOT DETECTED
Serratia marcescens: NOT DETECTED
Staphylococcus aureus (BCID): DETECTED — AB
Staphylococcus species: DETECTED — AB
Streptococcus agalactiae: NOT DETECTED
Streptococcus pneumoniae: NOT DETECTED
Streptococcus pyogenes: NOT DETECTED
Streptococcus species: NOT DETECTED

## 2018-09-03 LAB — URINALYSIS, COMPLETE (UACMP) WITH MICROSCOPIC
Bacteria, UA: NONE SEEN
Bilirubin Urine: NEGATIVE
Glucose, UA: NEGATIVE mg/dL
Ketones, ur: NEGATIVE mg/dL
NITRITE: NEGATIVE
Protein, ur: 100 mg/dL — AB
RBC / HPF: >50 RBC/hpf — ABNORMAL HIGH (ref 0–5)
SPECIFIC GRAVITY, URINE: 1.01 (ref 1.005–1.030)
WBC, UA: >50 WBC/hpf — ABNORMAL HIGH (ref 0–5)
pH: 7 (ref 5.0–8.0)

## 2018-09-03 LAB — CBC
HCT: 34.5 % — ABNORMAL LOW (ref 39.0–52.0)
HEMOGLOBIN: 10.9 g/dL — AB (ref 13.0–17.0)
MCH: 30.3 pg (ref 26.0–34.0)
MCHC: 31.6 g/dL (ref 30.0–36.0)
MCV: 95.8 fL (ref 80.0–100.0)
Platelets: 121 10*3/uL — ABNORMAL LOW (ref 150–400)
RBC: 3.6 MIL/uL — ABNORMAL LOW (ref 4.22–5.81)
RDW: 12.4 % (ref 11.5–15.5)
WBC: 8.9 10*3/uL (ref 4.0–10.5)
nRBC: 0 % (ref 0.0–0.2)

## 2018-09-03 LAB — LACTIC ACID, PLASMA
LACTIC ACID, VENOUS: 2.3 mmol/L — AB (ref 0.5–1.9)
Lactic Acid, Venous: 1.1 mmol/L (ref 0.5–1.9)

## 2018-09-03 LAB — AMMONIA: Ammonia: 38 umol/L — ABNORMAL HIGH (ref 9–35)

## 2018-09-03 LAB — BLOOD GAS, ARTERIAL
Acid-base deficit: 0.5 mmol/L (ref 0.0–2.0)
Bicarbonate: 23.1 mmol/L (ref 20.0–28.0)
FIO2: 0.28
O2 Saturation: 98.8 %
PCO2 ART: 34 mmHg (ref 32.0–48.0)
PH ART: 7.44 (ref 7.350–7.450)
Patient temperature: 37
pO2, Arterial: 121 mmHg — ABNORMAL HIGH (ref 83.0–108.0)

## 2018-09-03 LAB — BASIC METABOLIC PANEL
Anion gap: 7 (ref 5–15)
BUN: 31 mg/dL — ABNORMAL HIGH (ref 8–23)
CO2: 25 mmol/L (ref 22–32)
Calcium: 7.4 mg/dL — ABNORMAL LOW (ref 8.9–10.3)
Chloride: 109 mmol/L (ref 98–111)
Creatinine, Ser: 2.25 mg/dL — ABNORMAL HIGH (ref 0.61–1.24)
GFR calc Af Amer: 34 mL/min — ABNORMAL LOW (ref >60)
GFR calc non Af Amer: 29 mL/min — ABNORMAL LOW (ref >60)
Glucose, Bld: 107 mg/dL — ABNORMAL HIGH (ref 70–99)
Potassium: 3.3 mmol/L — ABNORMAL LOW (ref 3.5–5.1)
Sodium: 141 mmol/L (ref 135–145)

## 2018-09-03 LAB — PHOSPHORUS
Phosphorus: 2.6 mg/dL (ref 2.5–4.6)
Phosphorus: 3.3 mg/dL (ref 2.5–4.6)

## 2018-09-03 LAB — GLUCOSE, CAPILLARY: Glucose-Capillary: 109 mg/dL — ABNORMAL HIGH (ref 70–99)

## 2018-09-03 LAB — NA AND K (SODIUM & POTASSIUM), RAND UR
Potassium Urine: 35 mmol/L
Sodium, Ur: 59 mmol/L

## 2018-09-03 LAB — MAGNESIUM
Magnesium: 1.2 mg/dL — ABNORMAL LOW (ref 1.7–2.4)
Magnesium: 1.4 mg/dL — ABNORMAL LOW (ref 1.7–2.4)

## 2018-09-03 LAB — OCCULT BLOOD X 1 CARD TO LAB, STOOL: FECAL OCCULT BLD: NEGATIVE

## 2018-09-03 LAB — PROTEIN, URINE, RANDOM: Total Protein, Urine: 129 mg/dL

## 2018-09-03 LAB — PROCALCITONIN: Procalcitonin: 23.99 ng/mL

## 2018-09-03 LAB — FOLATE: Folate: 25 ng/mL (ref >5.9)

## 2018-09-03 LAB — MRSA PCR SCREENING: MRSA by PCR: NEGATIVE

## 2018-09-03 LAB — VITAMIN B12: Vitamin B-12: 335 pg/mL (ref 180–914)

## 2018-09-03 LAB — TSH: TSH: 0.908 u[IU]/mL (ref 0.350–4.500)

## 2018-09-03 LAB — POTASSIUM: Potassium: 3 mmol/L — ABNORMAL LOW (ref 3.5–5.1)

## 2018-09-03 LAB — CREATININE, URINE, RANDOM: Creatinine, Urine: 85 mg/dL

## 2018-09-03 MED ORDER — ONDANSETRON HCL 4 MG/2ML IJ SOLN: 4.0000 mg | Freq: Four times a day (QID) | INTRAMUSCULAR | Status: DC | PRN

## 2018-09-03 MED ORDER — ATORVASTATIN CALCIUM 20 MG PO TABS
20.0000 mg | ORAL_TABLET | Freq: Every day | ORAL | Status: DC
  Administered 2018-09-03 – 2018-09-05 (×2): 20 mg via ORAL
  Filled 2018-09-03 (×2): qty 1

## 2018-09-03 MED ORDER — BISACODYL 5 MG PO TBEC: 5.0000 mg | DELAYED_RELEASE_TABLET | Freq: Every day | ORAL | Status: DC | PRN

## 2018-09-03 MED ORDER — TIOTROPIUM BROMIDE MONOHYDRATE 18 MCG IN CAPS
18.0000 ug | ORAL_CAPSULE | Freq: Every day | RESPIRATORY_TRACT | Status: DC | PRN
  Filled 2018-09-03: qty 5

## 2018-09-03 MED ORDER — ASPIRIN EC 81 MG PO TBEC
81.0000 mg | DELAYED_RELEASE_TABLET | Freq: Every day | ORAL | Status: DC
  Administered 2018-09-03 – 2018-09-06 (×4): 81 mg via ORAL
  Filled 2018-09-03 (×4): qty 1

## 2018-09-03 MED ORDER — SODIUM CHLORIDE 0.9% FLUSH
10.0000 mL | Freq: Two times a day (BID) | INTRAVENOUS | Status: DC
  Administered 2018-09-03: 10 mL
  Administered 2018-09-03: 20 mL
  Administered 2018-09-04: 30 mL
  Administered 2018-09-05: 10:00:00 10 mL

## 2018-09-03 MED ORDER — MOMETASONE FURO-FORMOTEROL FUM 200-5 MCG/ACT IN AERO
2.0000 | INHALATION_SPRAY | Freq: Two times a day (BID) | RESPIRATORY_TRACT | Status: DC
  Administered 2018-09-03 – 2018-09-06 (×7): 2 via RESPIRATORY_TRACT
  Filled 2018-09-03: qty 8.8

## 2018-09-03 MED ORDER — POTASSIUM CHLORIDE 10 MEQ/100ML IV SOLN
10.0000 meq | INTRAVENOUS | Status: AC
  Administered 2018-09-03 (×3): 10 meq via INTRAVENOUS
  Filled 2018-09-03 (×3): qty 100

## 2018-09-03 MED ORDER — ENOXAPARIN SODIUM 40 MG/0.4ML ~~LOC~~ SOLN: 40.0000 mg | SUBCUTANEOUS | Status: DC

## 2018-09-03 MED ORDER — METOPROLOL TARTRATE 25 MG PO TABS
12.5000 mg | ORAL_TABLET | Freq: Two times a day (BID) | ORAL | Status: DC
  Administered 2018-09-03 – 2018-09-06 (×7): 12.5 mg via ORAL
  Filled 2018-09-03 (×7): qty 1

## 2018-09-03 MED ORDER — PANTOPRAZOLE SODIUM 40 MG PO TBEC
40.0000 mg | DELAYED_RELEASE_TABLET | Freq: Every evening | ORAL | Status: DC
  Administered 2018-09-03 – 2018-09-05 (×2): 40 mg via ORAL
  Filled 2018-09-03 (×2): qty 1

## 2018-09-03 MED ORDER — LEVOFLOXACIN IN D5W 750 MG/150ML IV SOLN
750.0000 mg | Freq: Once | INTRAVENOUS | Status: AC
  Administered 2018-09-03: 750 mg via INTRAVENOUS
  Filled 2018-09-03: qty 150

## 2018-09-03 MED ORDER — POTASSIUM CHLORIDE 10 MEQ/100ML IV SOLN
10.0000 meq | INTRAVENOUS | Status: AC
  Administered 2018-09-03 (×4): 10 meq via INTRAVENOUS
  Filled 2018-09-03 (×4): qty 100

## 2018-09-03 MED ORDER — SODIUM CHLORIDE 0.9% FLUSH: 10.0000 mL | INTRAVENOUS | Status: DC | PRN

## 2018-09-03 MED ORDER — SODIUM CHLORIDE 0.9 % IV SOLN
INTRAVENOUS | Status: DC
  Administered 2018-09-03: 02:00:00 via INTRAVENOUS

## 2018-09-03 MED ORDER — ACETAMINOPHEN 325 MG PO TABS
650.0000 mg | ORAL_TABLET | Freq: Four times a day (QID) | ORAL | Status: DC | PRN
  Administered 2018-09-03 – 2018-09-04 (×3): 650 mg via ORAL
  Filled 2018-09-03 (×3): qty 2

## 2018-09-03 MED ORDER — ACETAMINOPHEN 650 MG RE SUPP
650.0000 mg | Freq: Four times a day (QID) | RECTAL | Status: DC | PRN
  Administered 2018-09-03: 650 mg via RECTAL
  Filled 2018-09-03: qty 1

## 2018-09-03 MED ORDER — LEVALBUTEROL HCL 0.63 MG/3ML IN NEBU
0.6300 mg | INHALATION_SOLUTION | Freq: Four times a day (QID) | RESPIRATORY_TRACT | Status: DC | PRN
  Filled 2018-09-03: qty 3

## 2018-09-03 MED ORDER — MAGNESIUM SULFATE 2 GM/50ML IV SOLN
2.0000 g | Freq: Once | INTRAVENOUS | Status: AC
  Administered 2018-09-03: 2 g via INTRAVENOUS
  Filled 2018-09-03: qty 50

## 2018-09-03 MED ORDER — LACTATED RINGERS IV SOLN
INTRAVENOUS | Status: DC
  Administered 2018-09-03 – 2018-09-06 (×5): via INTRAVENOUS

## 2018-09-03 MED ORDER — ALBUTEROL SULFATE (2.5 MG/3ML) 0.083% IN NEBU
2.5000 mg | INHALATION_SOLUTION | Freq: Four times a day (QID) | RESPIRATORY_TRACT | Status: DC | PRN
  Administered 2018-09-05: 2.5 mg via RESPIRATORY_TRACT
  Filled 2018-09-03: qty 3

## 2018-09-03 MED ORDER — SENNOSIDES-DOCUSATE SODIUM 8.6-50 MG PO TABS: 1.0000 | ORAL_TABLET | Freq: Every evening | ORAL | Status: DC | PRN

## 2018-09-03 MED ORDER — HEPARIN SODIUM (PORCINE) 5000 UNIT/ML IJ SOLN
5000.0000 [IU] | Freq: Three times a day (TID) | INTRAMUSCULAR | Status: DC
  Administered 2018-09-03 – 2018-09-06 (×7): 5000 [IU] via SUBCUTANEOUS
  Filled 2018-09-03 (×8): qty 1

## 2018-09-03 MED ORDER — CEFAZOLIN SODIUM-DEXTROSE 2-4 GM/100ML-% IV SOLN
2.0000 g | Freq: Two times a day (BID) | INTRAVENOUS | Status: DC
  Administered 2018-09-03 – 2018-09-04 (×3): 2 g via INTRAVENOUS
  Filled 2018-09-03 (×4): qty 100

## 2018-09-03 MED ORDER — ONDANSETRON HCL 4 MG PO TABS: 4.0000 mg | ORAL_TABLET | Freq: Four times a day (QID) | ORAL | Status: DC | PRN

## 2018-09-03 MED ORDER — POTASSIUM CHLORIDE CRYS ER 10 MEQ PO TBCR
30.0000 meq | EXTENDED_RELEASE_TABLET | ORAL | Status: AC
  Administered 2018-09-03 – 2018-09-04 (×2): 30 meq via ORAL
  Filled 2018-09-03 (×2): qty 3

## 2018-09-03 NOTE — ED Notes (Addendum)
Date and time results received: 09/03/18 0025(use smartphrase ".now" to insert current time)  Test: Lactic Acid Critical Value: 2.3  Name of Provider Notified: SS Orders Received? No new orders Or Actions Taken?:

## 2018-09-03 NOTE — Consult Note (Signed)
PULMONARY / CRITICAL CARE MEDICINE  Name: Steve Gilbert MRN: [id number] DOB: 09/02/1950    LOS: 1  Referring Provider: Dr. Junious Silk Reason for Referral: Urosepsis  HPI:  Steve Gilbert is a 68 yo WM who underwentrobotic cystoprostatectomy with ICG sentinal + template lymphadenectomyand ileal conduit1/24/20 forfor Stage IIIHigh Grade Muscle Invasive BLadder Cancer with Right Malignant Hydronephrosis. He underwent neoadjuvant chemo with4 cycles dose-dense MVAC by Dr. Rogue Bussing at [facility].Final path TisN1. He was on po Bactrim through 08/23/2018.   He presented to the ED with chills, fever, and altered mental status.  At the ED he had a temperature of 103.  He was tachycardic and but his blood pressure was stable.  His creatinine increased from 1.2.  To 2.5.  His CT abdomen showed left perinephric inflammation and fluid stranding suggestive of ascending urinary tract infection.  CT also showed bilateral hydronephrosis and hydroureter.  He met criteria for urosepsis and is being admitted to the ICU for further management.  He has been started on broad-spectrum antibiotics and IV fluids.  He remains somnolent but will provide yes or no answers to questions.  He denies chest pain, shortness of breath, abdominal pain, nausea and vomiting.  Past Medical History:  Diagnosis Date  . Acid reflux   . Allergy   . Cancer ([place])   . COPD (chronic obstructive pulmonary disease) ([place])   . High cholesterol   . Hyperlipidemia 04/03/2018  . Hypertension   . Pneumonia    Past Surgical History:  Procedure Laterality Date  . CYSTOSCOPY W/ URETERAL STENT REMOVAL Right 08/10/2018   Procedure: CYSTOSCOPY WITH STENT REMOVAL;  Surgeon: Alexis Frock, MD;  Location: [facility];  Service: Urology;  Laterality: Right;  . CYSTOSCOPY WITH BIOPSY N/A 04/04/2018   Procedure: CYSTOSCOPY WITH TURBT;  Surgeon: Hollice Espy, MD;  Location: [facility] OR[facility];  Service: Urology;  Laterality: N/A;  . CYSTOSCOPY WITH  URETEROSCOPY AND STENT PLACEMENT Right 04/04/2018   Procedure: CYSTOSCOPY WITH URETEROSCOPY AND STENT PLACEMENT;  Surgeon: Hollice Espy, MD;  Location: [facility] OR[facility];  Service: Urology;  Laterality: Right;  . NASAL SINUS SURGERY    . PORTA CATH INSERTION N/A 04/23/2018   Procedure: PORTA CATH INSERTION;  Surgeon: Algernon Huxley, MD;  Location: [facility];  Service: Cardiovascular;  Laterality: N/A;  . TRANSURETHRAL RESECTION OF PROSTATE N/A 04/04/2018   Procedure: TRANSURETHRAL RESECTION OF THE PROSTATE (TURP);  Surgeon: Hollice Espy, MD;  Location: [facility];  Service: Urology;  Laterality: N/A;  . URETERAL BIOPSY Right 04/04/2018   Procedure: URETERAL BIOPSY;  Surgeon: Hollice Espy, MD;  Location: [facility] OR[facility];  Service: Urology;  Laterality: Right;   Prior to Admission medications   Medication Sig Start Date End Date Taking? Authorizing Provider  amLODipine (NORVASC) 5 MG tablet Take 5 mg by mouth daily.   Yes [provider]  clopidogrel (PLAVIX) 75 MG tablet Take 75 mg by mouth daily.   Yes [provider]  donepezil (ARICEPT) 5 MG tablet Take 1 tablet (5 mg total) by mouth at bedtime. 03/12/18 04/21/18 Yes Sowles, Drue Stager, MD  empagliflozin (JARDIANCE) 25 MG TABS tablet Take 25 mg by mouth daily.   Yes [provider]  glycopyrrolate (ROBINUL) 1 MG tablet Take 1 mg by mouth 2 (two) times daily.   Yes [provider]  insulin aspart (NOVOLOG FLEXPEN) 100 UNIT/ML FlexPen Inject 12 Units into the skin 2 (two) times daily.   Yes [provider]  insulin aspart (NOVOLOG) 100 UNIT/ML FlexPen Inject  18 Units into the skin daily. At 1700   Yes [provider]  Insulin Degludec-Liraglutide (XULTOPHY) 100-3.6 UNIT-MG/ML SOPN Inject 50 Units into the skin daily.   Yes [provider]  levETIRAcetam (KEPPRA) 500 MG tablet Take 500 mg by mouth 2 (two) times daily.   Yes [provider]  lipase/protease/amylase (CREON) 12000 units  CPEP capsule Take 6,000 Units by mouth 3 (three) times daily before meals.   Yes [provider]  lipase/protease/amylase (CREON) 12000 units CPEP capsule Take 3,000 Units by mouth at bedtime. With snack   Yes [provider]  lisinopril (PRINIVIL,ZESTRIL) 5 MG tablet Take 5 mg by mouth daily.   Yes [provider]  metoprolol succinate (TOPROL-XL) 25 MG 24 hr tablet Take 1 tablet (25 mg total) by mouth daily. 03/12/18  Yes Sowles, Drue Stager, MD  rosuvastatin (CRESTOR) 40 MG tablet Take 1 tablet (40 mg total) by mouth daily. 03/12/18 04/21/18 Yes Steele Sizer, MD  aspirin EC 81 MG tablet Take 81 mg by mouth daily.    [provider]  famotidine (PEPCID) 20 MG tablet Take 1 tablet (20 mg total) by mouth 2 (two) times daily. 03/12/18 04/11/18  Steele Sizer, MD  gabapentin (NEURONTIN) 300 MG capsule Take 1 capsule (300 mg total) by mouth 2 (two) times daily. 03/12/18 04/11/18  Steele Sizer, MD  insulin glargine (LANTUS) 100 UNIT/ML injection Inject 0.1 mLs (10 Units total) into the skin daily. 03/12/18 04/11/18  Steele Sizer, MD  lacosamide 100 MG TABS Take 1 tablet (100 mg total) by mouth 2 (two) times daily. Patient not taking: Reported on 04/21/2018 07/28/17   Fritzi Mandes, MD  promethazine (PHENERGAN) 12.5 MG tablet Take 1 tablet (12.5 mg total) by mouth every 6 (six) hours as needed for nausea or vomiting. Patient not taking: Reported on 04/21/2018 05/16/17   Stark Klein, MD  sertraline (ZOLOFT) 25 MG tablet Take 1 tablet (25 mg total) by mouth daily. Patient not taking: Reported on 04/21/2018 03/12/18   Steele Sizer, MD   Allergies Allergies  Allergen Reactions  . Contrast Media [Iodinated Diagnostic Agents] Itching and Rash    Family History Family History  Problem Relation Age of Onset  . Diabetes Mother   . Heart disease Father   . Diabetes Brother   . Heart attack Brother   . Heart disease Brother    Social History  reports that he quit  smoking about 11 years ago. His smoking use included cigarettes. He has a 45.00 pack-year smoking history. He has quit using smokeless tobacco. He reports current alcohol use of about 7.0 standard drinks of alcohol per week. He reports previous drug use.  Review Of Systems:   Constitutional: Positive for fever and chills.  HENT: Negative for congestion and rhinorrhea.  Eyes: Negative for redness and visual disturbance.  Respiratory: Negative for shortness of breath and wheezing.  Cardiovascular: Negative for chest pain and palpitations.  Gastrointestinal: Negative  for nausea , vomiting and abdominal pain and  Loose stools but positive for anorexia Genitourinary: Negative for dysuria and urgency.  Endocrine: Denies polyuria, polyphagia and heat intolerance Musculoskeletal: Negative for myalgias and arthralgias.  Skin: Negative for pallor and wound.  Neurological: Positive for somnolence  VITAL SIGNS: BP 126/68   Pulse (!) 132   Temp (!) 103.1 F (39.5 C) (Oral)   Resp (!) 0   Ht _0  (1.702 m)   Wt 93.9 kg   SpO2 98%   BMI 32.42 kg/m   HEMODYNAMICS:  VENTILATOR SETTINGS:    INTAKE / OUTPUT: No intake/output data recorded.  PHYSICAL EXAMINATION: General: Appears acutely ill HEENT: PERRLA, trachea midline, no JVD Neuro: Somnolent, moves all extremities Cardiovascular: Apical pulse regular, tachycardic, S1-S2, no murmur regurg or gallop, +2 pulses Lungs: Clear to auscultation bilaterally with breath sounds diminished in the bases Abdomen: Distended, warm to touch, hypoactive bowel sounds, diffuse tenderness with gentle palpation Musculoskeletal: Positive range of motion no joint deformities Skin: Warm and dry  LABS:  BMET Recent Labs  Lab 09/02/18 2042  NA 139  K 3.1*  CL 100  CO2 27  BUN 35*  CREATININE 2.56*  GLUCOSE 124*    Electrolytes Recent Labs  Lab 09/02/18 2042  CALCIUM 8.2*    CBC Recent Labs  Lab 09/02/18 2042  WBC 10.0  HGB 12.1*   HCT 36.7*  PLT 154    Coag's Recent Labs  Lab 09/02/18 2042  INR 1.27    Sepsis Markers Recent Labs  Lab 09/02/18 2042 09/02/18 2349 09/03/18 0020  LATICACIDVEN 2.5* 2.3*  --   PROCALCITON  --   --  23.99    ABG No results for input(s): PHART, PCO2ART, PO2ART in the last 168 hours.  Liver Enzymes Recent Labs  Lab 09/02/18 2042  AST 39  ALT 22  ALKPHOS 64  BILITOT 1.7*  ALBUMIN 3.5    Cardiac Enzymes No results for input(s): TROPONINI, PROBNP in the last 168 hours.  Glucose Recent Labs  Lab 09/03/18 0102  GLUCAP 109*    Imaging Ct Abdomen Pelvis Wo Contrast  Result Date: 09/02/2018 CLINICAL DATA:  Bladder removed on January 14th for bladder cancer. Now with fever and vomiting. Altered mental status. EXAM: CT ABDOMEN AND PELVIS WITHOUT CONTRAST TECHNIQUE: Multidetector CT imaging of the abdomen and pelvis was performed following the standard protocol without IV contrast. COMPARISON:  CT abdomen dated 07/06/2018. FINDINGS: Lower chest: No acute abnormality. Hepatobiliary: No focal liver abnormality is seen. No gallstones, gallbladder wall thickening, or biliary dilatation. Pancreas: Pancreas is unremarkable. Spleen: Normal in size without focal abnormality. Adrenals/Urinary Tract: Status post bladder resection and ileal conduit creation. Catheter in place at the ileal conduit outlet at the RIGHT lower abdominal wall. Bilateral hydronephrosis and hydroureter, mild to moderate in degree. LEFT perinephric inflammation/fluid stranding. Stomach/Bowel: No dilated large or small bowel loops. Stomach appears normal. Appendix is normal. Vascular/Lymphatic: Aortic atherosclerosis. No enlarged lymph nodes seen. Reproductive: Apparent prostatectomy with the aforementioned bladder resection. Other: No abscess collection seen. No free intraperitoneal air. Expected postsurgical changes within the subcutaneous soft tissues of the lower abdomen and pelvis. Musculoskeletal: No acute or  suspicious osseous finding. IMPRESSION: 1. Status post bladder resection and ileal conduit creation. Catheter in place at the ileal conduit outlet at the RIGHT lower abdominal wall. 2. Prominent LEFT perinephric inflammation/fluid stranding. Findings are suspicious for ascending urinary tract infection. Recommend correlation with urinalysis. 3. Bilateral hydronephrosis and hydroureter, mild to moderate in degree. This may be expected given the recent ileal conduit creation. Normal urine output? 4. No abscess collection seen. No free intraperitoneal air. No bowel obstruction. Aortic Atherosclerosis (ICD10-I70.0). Electronically Signed   By: Franki Cabot M.D.   On: 09/02/2018 23:07   Dg Chest Port 1 View  Result Date: 09/02/2018 CLINICAL DATA:  Bladder removed on January 14th for bladder cancer. Fever and altered mental status. EXAM: PORTABLE CHEST 1 VIEW COMPARISON:  Chest x-ray dated 05/15/2018. FINDINGS: RIGHT IJ Port-A-Cath appears stable in position with tip at the level of the upper SVC.  Heart size and mediastinal contours are stable. Lungs are clear. No pleural effusions seen. Osseous structures about the chest are unremarkable. IMPRESSION: No active disease.  No evidence of pneumonia. Electronically Signed   By: Franki Cabot M.D.   On: 09/02/2018 21:31    STUDIES:  None  CULTURES: Blood cultures x2 Urine culture  ANTIBIOTICS: Zosyn Vancomycin-discontinued, MRSA screen negative  SIGNIFICANT EVENTS: 09/02/2018: Admitted  LINES/TUBES: Right chest wall Mediport Peripheral IVs  DISCUSSION: 68 year old male with bladder cancer status post resection, now presenting with urosepsis  ASSESSMENT  Urosepsis Acute metabolic encephalopathy Bilateral hydronephrosis Acute renal failure Hypokalemia Bladder Ca (s/p recent radical cystectomy + ileal conduit  History of COPD, hypertension and hyperlipidemia  PLAN Hemodynamic monitoring per ICU protocol Continue Zosyn Gentle IV  hydration Urology following Monitor and correct electrolytes Monitor I's and O's and trend creatinine Monitor curve and follow-up cultures Trend procalcitonin Resume all home medications  Best Practice: Code Status: Full code Diet: N.p.o. until mental status improves GI prophylaxis: Protonix VTE prophylaxis: Subcu Lovenox  FAMILY  - Updates: No family at bedside.  Will update when available   S. [facility] ANP-BC Pulmonary and Critical Care Medicine [facility] Pager [phone] or [phone]  NB: This document was prepared using Dragon voice recognition software and may include unintentional dictation errors.    09/03/2018, 1:25 AM

## 2018-09-03 NOTE — Consult Note (Signed)
NAME: Steve Gilbert  DOB: 10-03-1950  MRN: [id number]  Date/Time: 09/03/2018 1:25 PM  REQUESTING PROVIDER: Dr. Brett Albino Subjective:  REASON FOR CONSULT: Staph aureus bacteremia ? Steve Gilbert is a 68 y.o. male with a history of bladder CA status post robot-assisted laparoscopic radical cystoprostatectomy, bilateral pelvic lymphadenectomy, ileal conduit and right inguinal hernia repair on 08/10/2018 presents with weakness and altered mental status.  Patient has high-grade muscle invasive bladder cancer with right malignant hydronephrosis with status post transurethral resection of the bladder tumor, right ureteroscopy, right stent September 2019 for invasive bladder cancer  by Dr. Erlene Quan in [place], followed by port placement on 04/23/2018 and followed by 4 cycles of dose dense MVAC by Dr. Burlene Arnt  at [facility].  He completed his chemotherapy December 2019.  He then underwent on 08/10/2018 by Dr. Bess Harvest a robot-assisted laparoscopic radical cystoprostatectomy, bilateral pelvic lymphadenectomy and ileal conduit creation.  He was discharged from the hospital on 08/16/2018.He was given a week of Bactrim on discharge. As per his wife and the patient he was doing reasonably well until couple of days ago when he started getting some cough which he thought was secondary to his sinusitis.  He went to bed on Saturday and slept the whole day and on Sunday when his wife left for work he was still in bed.  That afternoon when the wife noticed that his bed was wet and ileostomy bag had come loose and she made him get out of bed to clean but he was confused and he almost fell.  So she called EMS and the temperature was recorded as 104. In the ED had a temperature of 105, BP of 161/85, respiratory rate of 31.  Blood cultures were sent.  His white count was 10, Hb of 12.1, creatinine of 2.56, lactate of 2.5 and pro calcitonin of 23.99.  UA showed more than 50 WBC and hence he had a CT abdomen and pelvis and  that showed prominent left perinephric inflammation/fluid stranding and bilateral hydronephrosis and hydroureter.  No abscess collection seen.  He was admitted to the ICU with severe sepsis he was initially started on vancomycin, metronidazole and cefepime.  Blood culture from both his right forearm and left forearm growing gram-positive cocci which is staph aureus.  His antibiotic has been changed to cefazolin and I am asked to see this patient.  No travel history, No recent steroid use   Past medical history Hyperlipidemia Hypertension Bladder CA GERD Seasonal allergy COPD History of sinusitis  Past surgical history Sinus surgery TURBT with rt ureteral stent placement  port placement  08/10/2018: Robotic radical  prostatectomy   Past Medical History:  Diagnosis Date  . Acid reflux   . Allergy   . Cancer ([place])   . COPD (chronic obstructive pulmonary disease) ([place])   . High cholesterol   . Hyperlipidemia 04/03/2018  . Hypertension   . Pneumonia     Past Surgical History:  Procedure Laterality Date  . CYSTOSCOPY W/ URETERAL STENT REMOVAL Right 08/10/2018   Procedure: CYSTOSCOPY WITH STENT REMOVAL;  Surgeon: Alexis Frock, MD;  Location: [facility];  Service: Urology;  Laterality: Right;  . CYSTOSCOPY WITH BIOPSY N/A 04/04/2018   Procedure: CYSTOSCOPY WITH TURBT;  Surgeon: Hollice Espy, MD;  Location: [facility];  Service: Urology;  Laterality: N/A;  . CYSTOSCOPY WITH URETEROSCOPY AND STENT PLACEMENT Right 04/04/2018   Procedure: CYSTOSCOPY WITH URETEROSCOPY AND STENT PLACEMENT;  Surgeon: Hollice Espy, MD;  Location: [facility];  Service: Urology;  Laterality: Right;  . NASAL SINUS SURGERY    . PORTA CATH INSERTION N/A 04/23/2018   Procedure: PORTA CATH INSERTION;  Surgeon: Algernon Huxley, MD;  Location: [facility];  Service: Cardiovascular;  Laterality: N/A;  . TRANSURETHRAL RESECTION OF PROSTATE N/A 04/04/2018   Procedure: TRANSURETHRAL RESECTION OF THE PROSTATE (TURP);   Surgeon: Hollice Espy, MD;  Location: [facility];  Service: Urology;  Laterality: N/A;  . URETERAL BIOPSY Right 04/04/2018   Procedure: URETERAL BIOPSY;  Surgeon: Hollice Espy, MD;  Location: [facility];  Service: Urology;  Laterality: Right;    Social history Lives with his wife Former smoker Occasional alcohol No illicit drug use Family History  Problem Relation Age of Onset  . Diabetes Mother   . Heart disease Father   . Diabetes Brother   . Heart attack Brother   . Heart disease Brother    Allergies  Allergen Reactions  . Contrast Media [Iodinated Diagnostic Agents] Itching and Rash  ? Current Facility-Administered Medications  Medication Dose Route Frequency Provider Last Rate Last Dose  . acetaminophen (TYLENOL) tablet 650 mg  650 mg Oral Q6H PRN Arta Silence, MD       Or  . acetaminophen (TYLENOL) suppository 650 mg  650 mg Rectal Q6H PRN Arta Silence, MD   650 mg at 09/03/18 0503  . albuterol (PROVENTIL) (2.5 MG/3ML) 0.083% nebulizer solution 2.5 mg  2.5 mg Nebulization Q6H PRN Arta Silence, MD      . aspirin EC tablet 81 mg  81 mg Oral Daily Arta Silence, MD   81 mg at 09/03/18 1025  . atorvastatin (LIPITOR) tablet 20 mg  20 mg Oral Daily Arta Silence, MD      . bisacodyl (DULCOLAX) EC tablet 5 mg  5 mg Oral Daily PRN Arta Silence, MD      . ceFAZolin (ANCEF) IVPB 2g/100 mL premix  2 g Intravenous Q12H Wilhelmina Mcardle, MD 200 mL/hr at 09/03/18 1157    . heparin injection 5,000 Units  5,000 Units Subcutaneous Q8H Arta Silence, MD   5,000 Units at 09/03/18 0400  . lactated ringers infusion   Intravenous Continuous Wilhelmina Mcardle, MD 75 mL/hr at 09/03/18 1157    . metoprolol tartrate (LOPRESSOR) tablet 12.5 mg  12.5 mg Oral BID Arta Silence, MD   12.5 mg at 09/03/18 1025  . mometasone-formoterol (DULERA) 200-5 MCG/ACT inhaler 2 puff  2 puff Inhalation BID Arta Silence, MD   2 puff at 09/03/18 1025  .  ondansetron (ZOFRAN) injection 4 mg  4 mg Intravenous Q6H PRN Arta Silence, MD      . pantoprazole (PROTONIX) EC tablet 40 mg  40 mg Oral QPM Sridharan, Prasanna, MD      . senna-docusate (Senokot-S) tablet 1 tablet  1 tablet Oral QHS PRN Arta Silence, MD      . sodium chloride flush (NS) 0.9 % injection 10-40 mL  10-40 mL Intracatheter Q12H Arta Silence, MD   20 mL at 09/03/18 1015  . sodium chloride flush (NS) 0.9 % injection 10-40 mL  10-40 mL Intracatheter PRN Arta Silence, MD         Abtx:  Anti-infectives (From admission, onward)   Start     Dose/Rate Route Frequency Ordered Stop   09/03/18 1015  ceFAZolin (ANCEF) IVPB 2g/100 mL premix     2 g 200 mL/hr over 30 Minutes Intravenous Every 12 hours 09/03/18 1009     09/03/18 0800  piperacillin-tazobactam (ZOSYN) IVPB 3.375 g  Status:  Discontinued     3.375 g 100 mL/hr over 30 Minutes Intravenous Every 8 hours 09/02/18 2348 09/02/18 2352   09/03/18 0000  metroNIDAZOLE (FLAGYL) IVPB 500 mg  Status:  Discontinued     500 mg 100 mL/hr over 60 Minutes Intravenous  Once 09/02/18 2347 09/02/18 2352   09/03/18 0000  piperacillin-tazobactam (ZOSYN) IVPB 3.375 g  Status:  Discontinued     3.375 g 12.5 mL/hr over 240 Minutes Intravenous Every 8 hours 09/02/18 2352 09/03/18 1009   09/02/18 2145  vancomycin (VANCOCIN) 1,750 mg in sodium chloride 0.9 % 500 mL IVPB     1,750 mg 250 mL/hr over 120 Minutes Intravenous  Once 09/02/18 2143 09/03/18 0102   09/02/18 2115  ceFEPIme (MAXIPIME) 2 g in sodium chloride 0.9 % 100 mL IVPB     2 g 200 mL/hr over 30 Minutes Intravenous  Once 09/02/18 2111 09/02/18 2224   09/02/18 2115  metroNIDAZOLE (FLAGYL) IVPB 500 mg  Status:  Discontinued     500 mg 100 mL/hr over 60 Minutes Intravenous Every 8 hours 09/02/18 2111 09/02/18 2347   09/02/18 2115  vancomycin (VANCOCIN) IVPB 1000 mg/200 mL premix  Status:  Discontinued     1,000 mg 200 mL/hr over 60 Minutes Intravenous  Once  09/02/18 2111 09/02/18 2143      REVIEW OF SYSTEMS:  Const:  fever, chills, weight loss Eyes: negative diplopia or visual changes, negative eye pain ENT: coryza, negative sore throat Resp:  Cough,no  hemoptysis, dyspnea Cards: negative for chest pain, palpitations, lower extremity edema GU: Has an ileal conduit GI: Negative for abdominal pain, diarrhea, bleeding, constipation Skin: negative for rash and pruritus Heme: negative for easy bruising and gum/nose bleeding MS: Fatigue and generalized weakness Neurolo headaches, dizziness, no vertigo, memory problems  Psych: No anxiety, depression  Endocrine: No polyuria or polydipsia Allergy/Immunology-contrast media allergy: Objective:  VITALS:  BP 118/85   Pulse (!) 106   Temp 98.2 F (36.8 C) (Oral)   Resp (!) 28   Ht 5\' 7"  (1.702 m)   Wt 93.9 kg   SpO2 98%   BMI 32.42 kg/m  PHYSICAL EXAM:  General: Lethargic cooperative, oriented in place and person and time no distress, appears stated age.  Pale Head: Normocephalic, without obvious abnormality, atraumatic. Eyes: Conjunctivae clear, anicteric sclerae. Pupils are equal ENT Nares normal. No drainage or sinus tenderness. Lips, mucosa, and tongue normal. No Thrush Neck: Supple, symmetrical, no adenopathy, thyroid: non tender no carotid bruit and no JVD. Back: No CVA tenderness. Lungs: Bilateral air entry  heart: S1-S2 tachycardia  Right port site: No erythema or tenderness abdomen: Soft, ileal conduit present clear urine  extremities: atraumatic, no cyanosis. No edema. No clubbing Skin: No rashes or lesions. Or bruising Lymph: Cervical, supraclavicular normal. Neurologic: Grossly non-focal Pertinent Labs Lab Results CBC    Component Value Date/Time   WBC 8.9 09/03/2018 0423   RBC 3.60 (L) 09/03/2018 0423   HGB 10.9 (L) 09/03/2018 0423   HCT 34.5 (L) 09/03/2018 0423   PLT 121 (L) 09/03/2018 0423   MCV 95.8 09/03/2018 0423   MCH 30.3 09/03/2018 0423   MCHC 31.6  09/03/2018 0423   RDW 12.4 09/03/2018 0423   LYMPHSABS 0.3 (L) 09/02/2018 2042   MONOABS 1.4 (H) 09/02/2018 2042   EOSABS 0.0 09/02/2018 2042   BASOSABS 0.0 09/02/2018 2042    CMP Latest Ref Rng & Units 09/03/2018 09/02/2018 08/15/2018  Glucose 70 - 99 mg/dL 107(H) 124(H) 97  BUN  8 - 23 mg/dL 31(H) 35(H) 16  Creatinine 0.61 - 1.24 mg/dL 2.25(H) 2.56(H) 1.40(H)  Sodium 135 - 145 mmol/L 141 139 139  Potassium 3.5 - 5.1 mmol/L 3.3(L) 3.1(L) 3.1(L)  Chloride 98 - 111 mmol/L 109 100 102  CO2 22 - 32 mmol/L 25 27 29   Calcium 8.9 - 10.3 mg/dL 7.4(L) 8.2(L) 8.6(L)  Total Protein 6.5 - 8.1 g/dL - 6.6 -  Total Bilirubin 0.3 - 1.2 mg/dL - 1.7(H) -  Alkaline Phos 38 - 126 U/L - 64 -  AST 15 - 41 U/L - 39 -  ALT 0 - 44 U/L - 22 -      Microbiology: Recent Results (from the past 240 hour(s))  Culture, blood (Routine x 2)     Status: None (Preliminary result)   Collection Time: 09/02/18  8:43 PM  Result Value Ref Range Status   Specimen Description BLOOD RIGHT FOREARM  Final   Special Requests   Final    BOTTLES DRAWN AEROBIC AND ANAEROBIC Blood Culture results may not be optimal due to an excessive volume of blood received in culture bottles   Culture  Setup Time   Final    GRAM POSITIVE COCCI IN BOTH AEROBIC AND ANAEROBIC BOTTLES CRITICAL VALUE NOTED.  VALUE IS CONSISTENT WITH PREVIOUSLY REPORTED AND CALLED VALUE.    Culture   Final    NO GROWTH < 12 HOURS Performed at [facility], [address] [place], [place] [address]    Report Status PENDING  Incomplete  Culture, blood (Routine x 2)     Status: None (Preliminary result)   Collection Time: 09/02/18  8:43 PM  Result Value Ref Range Status   Specimen Description BLOOD LEFT FOREARM  Final   Special Requests   Final    BOTTLES DRAWN AEROBIC AND ANAEROBIC Blood Culture results may not be optimal due to an excessive volume of blood received in culture bottles Performed at [facility], [address]  [place], [place] [address]    Culture  Setup Time   Final    GRAM POSITIVE COCCI IN BOTH AEROBIC AND ANAEROBIC BOTTLES CRITICAL RESULT CALLED TO, READ BACK BY AND VERIFIED WITH: CHRISTINE KATSOUDAS AT 9379 09/03/18 SDR    Culture GRAM POSITIVE COCCI  Final   Report Status PENDING  Incomplete  Blood Culture ID Panel (Reflexed)     Status: Abnormal   Collection Time: 09/02/18  8:43 PM  Result Value Ref Range Status   Enterococcus species NOT DETECTED NOT DETECTED Final   Listeria monocytogenes NOT DETECTED NOT DETECTED Final   Staphylococcus species DETECTED (A) NOT DETECTED Final    Comment: CRITICAL RESULT CALLED TO, READ BACK BY AND VERIFIED WITH:  CHRISTINE KATSOUDAS AT 0240 09/03/18 SDR    Staphylococcus aureus (BCID) DETECTED (A) NOT DETECTED Final    Comment: Methicillin (oxacillin) susceptible Staphylococcus aureus (MSSA). Preferred therapy is anti staphylococcal beta lactam antibiotic (Cefazolin or Nafcillin), unless clinically contraindicated. CRITICAL RESULT CALLED TO, READ BACK BY AND VERIFIED WITH:  CHRISTINE KATSOUDAS AT 9735 09/03/18 SDR    Methicillin resistance NOT DETECTED NOT DETECTED Final   Streptococcus species NOT DETECTED NOT DETECTED Final   Streptococcus agalactiae NOT DETECTED NOT DETECTED Final   Streptococcus pneumoniae NOT DETECTED NOT DETECTED Final   Streptococcus pyogenes NOT DETECTED NOT DETECTED Final   Acinetobacter baumannii NOT DETECTED NOT DETECTED Final   Enterobacteriaceae species NOT DETECTED NOT DETECTED Final   Enterobacter cloacae complex NOT DETECTED NOT DETECTED Final  Escherichia coli NOT DETECTED NOT DETECTED Final   Klebsiella oxytoca NOT DETECTED NOT DETECTED Final   Klebsiella pneumoniae NOT DETECTED NOT DETECTED Final   Proteus species NOT DETECTED NOT DETECTED Final   Serratia marcescens NOT DETECTED NOT DETECTED Final   Haemophilus influenzae NOT DETECTED NOT DETECTED Final   Neisseria meningitidis NOT DETECTED NOT DETECTED Final    Pseudomonas aeruginosa NOT DETECTED NOT DETECTED Final   Candida albicans NOT DETECTED NOT DETECTED Final   Candida glabrata NOT DETECTED NOT DETECTED Final   Candida krusei NOT DETECTED NOT DETECTED Final   Candida parapsilosis NOT DETECTED NOT DETECTED Final   Candida tropicalis NOT DETECTED NOT DETECTED Final    Comment: Performed at [facility], [address] [place], [place] [address]  MRSA PCR Screening     Status: None   Collection Time: 09/03/18  2:15 AM  Result Value Ref Range Status   MRSA by PCR NEGATIVE NEGATIVE Final    Comment:        The GeneXpert MRSA Assay (FDA approved for NASAL specimens only), is one component of a comprehensive MRSA colonization surveillance program. It is not intended to diagnose MRSA infection nor to guide or monitor treatment for MRSA infections. Performed at [facility], [address] [place], [place] [address]     IMAGING RESULTS: CT abdomen Status post bladder resection and ileal conduit creation. Catheter in place at the ileal conduit outlet at the RIGHT lower abdominal wall. 2. Prominent LEFT perinephric inflammation/fluid stranding. Findings are suspicious for ascending urinary tract infection. Recommend correlation with urinalysis. 3. Bilateral hydronephrosis and hydroureter, mild to moderate in degree. This may be expected given the recent ileal conduit creation. 4. No abscess collection seen. No free intraperitoneal air. No bowel obstruction. I have personally reviewed the films ? Impression/Recommendation ?68 y.o. male with a history of bladder CA status post robot-assisted laparoscopic radical cystoprostatectomy, bilateral pelvic lymphadenectomy, ileal conduit and right inguinal hernia repair on 08/10/2018 presents with weakness and altered mental status and found to have a temp of 105, Ct showed b/l Hydronephrosis with some perinephric stranding on the left side. Has a port and Blood culture is positive  for staph aureus  MSSA bacteremia has a port ,which could be the source- do not see any other source like skin, pneumonia Currently on cefazolin Port will have to be removed- need 2 d echo- repeat blood culture until clear of bacteremia  He had a temp of 105 which raises concern for pyelonephritis especially with him having bander stents which were removed inadvertently by him and also Ct showing left perinephric stranding- Staph aureus  less likely to cause pyelonephritis- His antibiotic has been narrowed to cefazolin which may not give adequate gram neg coverage- will give a dose of levaquin Await urine culture   Ca bladder s/p cystoprostatectomy and ileal conduit  AKI- could be from sepsis- as per urology there is no indication for nephrostomy tubes for the mild b/l hydronephrosis? ? ___________________________________________________ Discussed the management with the patient, his wife and the hospitalist  Note:  This document was prepared using Dragon voice recognition software and may include unintentional dictation errors.

## 2018-09-03 NOTE — Progress Notes (Signed)
Urology Consult Follow Up  Subjective: Clinically improving, labs trending back towards baseline today.  Afebrile.  Conversive this afternoon.  Anti-infectives: Anti-infectives (From admission, onward)   Start     Dose/Rate Route Frequency Ordered Stop   09/03/18 1015  ceFAZolin (ANCEF) IVPB 2g/100 mL premix     2 g 200 mL/hr over 30 Minutes Intravenous Every 12 hours 09/03/18 1009     09/03/18 0800  piperacillin-tazobactam (ZOSYN) IVPB 3.375 g  Status:  Discontinued     3.375 g 100 mL/hr over 30 Minutes Intravenous Every 8 hours 09/02/18 2348 09/02/18 2352   09/03/18 0000  metroNIDAZOLE (FLAGYL) IVPB 500 mg  Status:  Discontinued     500 mg 100 mL/hr over 60 Minutes Intravenous  Once 09/02/18 2347 09/02/18 2352   09/03/18 0000  piperacillin-tazobactam (ZOSYN) IVPB 3.375 g  Status:  Discontinued     3.375 g 12.5 mL/hr over 240 Minutes Intravenous Every 8 hours 09/02/18 2352 09/03/18 1009   09/02/18 2145  vancomycin (VANCOCIN) 1,750 mg in sodium chloride 0.9 % 500 mL IVPB     1,750 mg 250 mL/hr over 120 Minutes Intravenous  Once 09/02/18 2143 09/03/18 0102   09/02/18 2115  ceFEPIme (MAXIPIME) 2 g in sodium chloride 0.9 % 100 mL IVPB     2 g 200 mL/hr over 30 Minutes Intravenous  Once 09/02/18 2111 09/02/18 2224   09/02/18 2115  metroNIDAZOLE (FLAGYL) IVPB 500 mg  Status:  Discontinued     500 mg 100 mL/hr over 60 Minutes Intravenous Every 8 hours 09/02/18 2111 09/02/18 2347   09/02/18 2115  vancomycin (VANCOCIN) IVPB 1000 mg/200 mL premix  Status:  Discontinued     1,000 mg 200 mL/hr over 60 Minutes Intravenous  Once 09/02/18 2111 09/02/18 2143      Current Facility-Administered Medications  Medication Dose Route Frequency Provider Last Rate Last Dose  . acetaminophen (TYLENOL) tablet 650 mg  650 mg Oral Q6H PRN Arta Silence, MD   650 mg at 09/03/18 1440   Or  . acetaminophen (TYLENOL) suppository 650 mg  650 mg Rectal Q6H PRN Arta Silence, MD   650 mg at 09/03/18  0503  . albuterol (PROVENTIL) (2.5 MG/3ML) 0.083% nebulizer solution 2.5 mg  2.5 mg Nebulization Q6H PRN Arta Silence, MD      . aspirin EC tablet 81 mg  81 mg Oral Daily Arta Silence, MD   81 mg at 09/03/18 1025  . atorvastatin (LIPITOR) tablet 20 mg  20 mg Oral Daily Arta Silence, MD   20 mg at 09/03/18 1718  . bisacodyl (DULCOLAX) EC tablet 5 mg  5 mg Oral Daily PRN Arta Silence, MD      . ceFAZolin (ANCEF) IVPB 2g/100 mL premix  2 g Intravenous Q12H Wilhelmina Mcardle, MD   Stopped at 09/03/18 1213  . heparin injection 5,000 Units  5,000 Units Subcutaneous Q8H Arta Silence, MD   5,000 Units at 09/03/18 1441  . lactated ringers infusion   Intravenous Continuous Wilhelmina Mcardle, MD 75 mL/hr at 09/03/18 1600    . metoprolol tartrate (LOPRESSOR) tablet 12.5 mg  12.5 mg Oral BID Arta Silence, MD   12.5 mg at 09/03/18 1025  . mometasone-formoterol (DULERA) 200-5 MCG/ACT inhaler 2 puff  2 puff Inhalation BID Arta Silence, MD   2 puff at 09/03/18 1025  . ondansetron (ZOFRAN) injection 4 mg  4 mg Intravenous Q6H PRN Arta Silence, MD      . pantoprazole (PROTONIX) EC tablet 40 mg  40 mg Oral QPM Arta Silence, MD   40 mg at 09/03/18 1718  . senna-docusate (Senokot-S) tablet 1 tablet  1 tablet Oral QHS PRN Arta Silence, MD      . sodium chloride flush (NS) 0.9 % injection 10-40 mL  10-40 mL Intracatheter Q12H Arta Silence, MD   20 mL at 09/03/18 1015  . sodium chloride flush (NS) 0.9 % injection 10-40 mL  10-40 mL Intracatheter PRN Arta Silence, MD         Objective: Vital signs in last 24 hours: Temp:  [98.2 F (36.8 C)-105.1 F (40.6 C)] 98.7 F (37.1 C) (02/17 1600) Pulse Rate:  [66-140] 92 (02/17 1620) Resp:  [0-40] 23 (02/17 1620) BP: (106-166)/(62-112) 114/76 (02/17 1620) SpO2:  [89 %-100 %] 100 % (02/17 1620) Weight:  [93.9 kg-94.7 kg] 93.9 kg (02/16 2025)  Intake/Output from previous day: 02/16 0701 -  02/17 0700 In: 1001.2 [I.V.:291.3; IV Piggyback:710] Out: 100 [Urine:100] Intake/Output this shift: Total I/O In: 946.5 [P.O.:120; I.V.:426.7; IV Piggyback:399.9] Out: 750 [Urine:750]   Physical Exam  Alert and oriented x3.  No acute distress. Abdomen is mildly distended, soft.  Incisions healing well.  Ostomy pink with clear yellow urine.  Slight bruising/blanching around port sites but no concern for cellulitis. Minimal lower extremity edema  Lab Results:  Recent Labs    09/02/18 2042 09/03/18 0423  WBC 10.0 8.9  HGB 12.1* 10.9*  HCT 36.7* 34.5*  PLT 154 121*   BMET Recent Labs    09/02/18 2042 09/03/18 0423  NA 139 141  K 3.1* 3.3*  CL 100 109  CO2 27 25  GLUCOSE 124* 107*  BUN 35* 31*  CREATININE 2.56* 2.25*  CALCIUM 8.2* 7.4*   PT/INR Recent Labs    09/02/18 2042  LABPROT 15.8*  INR 1.27   ABG Recent Labs    09/03/18 0138  PHART 7.44  HCO3 23.1    Studies/Results: Ct Abdomen Pelvis Wo Contrast  Result Date: 09/02/2018 CLINICAL DATA:  Bladder removed on January 14th for bladder cancer. Now with fever and vomiting. Altered mental status. EXAM: CT ABDOMEN AND PELVIS WITHOUT CONTRAST TECHNIQUE: Multidetector CT imaging of the abdomen and pelvis was performed following the standard protocol without IV contrast. COMPARISON:  CT abdomen dated 07/06/2018. FINDINGS: Lower chest: No acute abnormality. Hepatobiliary: No focal liver abnormality is seen. No gallstones, gallbladder wall thickening, or biliary dilatation. Pancreas: Pancreas is unremarkable. Spleen: Normal in size without focal abnormality. Adrenals/Urinary Tract: Status post bladder resection and ileal conduit creation. Catheter in place at the ileal conduit outlet at the RIGHT lower abdominal wall. Bilateral hydronephrosis and hydroureter, mild to moderate in degree. LEFT perinephric inflammation/fluid stranding. Stomach/Bowel: No dilated large or small bowel loops. Stomach appears normal. Appendix is  normal. Vascular/Lymphatic: Aortic atherosclerosis. No enlarged lymph nodes seen. Reproductive: Apparent prostatectomy with the aforementioned bladder resection. Other: No abscess collection seen. No free intraperitoneal air. Expected postsurgical changes within the subcutaneous soft tissues of the lower abdomen and pelvis. Musculoskeletal: No acute or suspicious osseous finding. IMPRESSION: 1. Status post bladder resection and ileal conduit creation. Catheter in place at the ileal conduit outlet at the RIGHT lower abdominal wall. 2. Prominent LEFT perinephric inflammation/fluid stranding. Findings are suspicious for ascending urinary tract infection. Recommend correlation with urinalysis. 3. Bilateral hydronephrosis and hydroureter, mild to moderate in degree. This may be expected given the recent ileal conduit creation. Normal urine output? 4. No abscess collection seen. No free intraperitoneal air. No bowel obstruction. Aortic Atherosclerosis (ICD10-I70.0).  Electronically Signed   By: Franki Cabot M.D.   On: 09/02/2018 23:07   Dg Chest Port 1 View  Result Date: 09/03/2018 CLINICAL DATA:  Sepsis.  Fever.  Follow-up exam. EXAM: PORTABLE CHEST 1 VIEW COMPARISON:  09/02/2018 FINDINGS: Cardiac silhouette is normal in size. No mediastinal or hilar masses. No evidence of adenopathy. Clear lungs.  No pleural effusion or pneumothorax. Skeletal structures are intact. Right internal jugular central venous Port-A-Cath is stable. IMPRESSION: No active disease. Electronically Signed   By: Lajean Manes M.D.   On: 09/03/2018 08:15   Dg Chest Port 1 View  Result Date: 09/02/2018 CLINICAL DATA:  Bladder removed on January 14th for bladder cancer. Fever and altered mental status. EXAM: PORTABLE CHEST 1 VIEW COMPARISON:  Chest x-ray dated 05/15/2018. FINDINGS: RIGHT IJ Port-A-Cath appears stable in position with tip at the level of the upper SVC. Heart size and mediastinal contours are stable. Lungs are clear. No pleural  effusions seen. Osseous structures about the chest are unremarkable. IMPRESSION: No active disease.  No evidence of pneumonia. Electronically Signed   By: Franki Cabot M.D.   On: 09/02/2018 21:31   CT scan personally reviewed.  Assessment/plan:  68 year old male status post cystoprostatectomy last month with Dr. Tresa Moore admitted with sepsis of probable urinary source.  Cultures pending.  Stents inadvertently pulled yesterday, hydronephrosis anticipated secondary to refluxing anastomoses.  Creatinine is trending down. -Continue supportive care -Continue to trend creatinine -Follow-up urine culture and adjust based on sensitivity -No indication for percutaneous nephrostomy tubes or any intervention at this time given that he is improving and his creatinine is downward trending   LOS: 1 day    Hollice Espy 09/03/2018

## 2018-09-03 NOTE — Progress Notes (Signed)
PHARMACY - PHYSICIAN COMMUNICATION CRITICAL VALUE ALERT - BLOOD CULTURE IDENTIFICATION (BCID)  Steve Gilbert is an 68 y.o. male who presented to [facility] on 09/02/2018 with a chief complaint of ?Urosepsis.  Assessment: 4/4 MSSA  Name of physician (or Provider) Contacted: Simonds  Current antibiotics: Zosyn  Changes to prescribed antibiotics recommended:  Cefazolin 2g IV Q12hr.   Results for orders placed or performed during the hospital encounter of 09/02/18  Blood Culture ID Panel (Reflexed) (Collected: 09/02/2018  8:43 PM)  Result Value Ref Range   Enterococcus species NOT DETECTED NOT DETECTED   Listeria monocytogenes NOT DETECTED NOT DETECTED   Staphylococcus species DETECTED (A) NOT DETECTED   Staphylococcus aureus (BCID) DETECTED (A) NOT DETECTED   Methicillin resistance NOT DETECTED NOT DETECTED   Streptococcus species NOT DETECTED NOT DETECTED   Streptococcus agalactiae NOT DETECTED NOT DETECTED   Streptococcus pneumoniae NOT DETECTED NOT DETECTED   Streptococcus pyogenes NOT DETECTED NOT DETECTED   Acinetobacter baumannii NOT DETECTED NOT DETECTED   Enterobacteriaceae species NOT DETECTED NOT DETECTED   Enterobacter cloacae complex NOT DETECTED NOT DETECTED   Escherichia coli NOT DETECTED NOT DETECTED   Klebsiella oxytoca NOT DETECTED NOT DETECTED   Klebsiella pneumoniae NOT DETECTED NOT DETECTED   Proteus species NOT DETECTED NOT DETECTED   Serratia marcescens NOT DETECTED NOT DETECTED   Haemophilus influenzae NOT DETECTED NOT DETECTED   Neisseria meningitidis NOT DETECTED NOT DETECTED   Pseudomonas aeruginosa NOT DETECTED NOT DETECTED   Candida albicans NOT DETECTED NOT DETECTED   Candida glabrata NOT DETECTED NOT DETECTED   Candida krusei NOT DETECTED NOT DETECTED   Candida parapsilosis NOT DETECTED NOT DETECTED   Candida tropicalis NOT DETECTED NOT DETECTED    Simpson,Michael L 09/03/2018  10:21 AM

## 2018-09-03 NOTE — Evaluation (Signed)
Clinical/Bedside Swallow Evaluation Patient Details  Name: Steve Gilbert MRN: [id number] Date of Birth: 08/18/1950  Today's Date: 09/03/2018 Time: SLP Start Time (ACUTE ONLY): 1751 SLP Stop Time (ACUTE ONLY): 0925 SLP Time Calculation (min) (ACUTE ONLY): 30 min  Past Medical History:  Past Medical History:  Diagnosis Date  . Acid reflux   . Allergy   . Cancer ([place])   . COPD (chronic obstructive pulmonary disease) ([place])   . High cholesterol   . Hyperlipidemia 04/03/2018  . Hypertension   . Pneumonia    Past Surgical History:  Past Surgical History:  Procedure Laterality Date  . CYSTOSCOPY W/ URETERAL STENT REMOVAL Right 08/10/2018   Procedure: CYSTOSCOPY WITH STENT REMOVAL;  Surgeon: Steve Frock, MD;  Location: [facility];  Service: Urology;  Laterality: Right;  . CYSTOSCOPY WITH BIOPSY N/A 04/04/2018   Procedure: CYSTOSCOPY WITH TURBT;  Surgeon: Hollice Espy, MD;  Location: [facility];  Service: Urology;  Laterality: N/A;  . CYSTOSCOPY WITH URETEROSCOPY AND STENT PLACEMENT Right 04/04/2018   Procedure: CYSTOSCOPY WITH URETEROSCOPY AND STENT PLACEMENT;  Surgeon: Hollice Espy, MD;  Location: [facility];  Service: Urology;  Laterality: Right;  . NASAL SINUS SURGERY    . PORTA CATH INSERTION N/A 04/23/2018   Procedure: PORTA CATH INSERTION;  Surgeon: Algernon Huxley, MD;  Location: [place] [facility];  Service: Cardiovascular;  Laterality: N/A;  . TRANSURETHRAL RESECTION OF PROSTATE N/A 04/04/2018   Procedure: TRANSURETHRAL RESECTION OF THE PROSTATE (TURP);  Surgeon: Hollice Espy, MD;  Location: [facility];  Service: Urology;  Laterality: N/A;  . URETERAL BIOPSY Right 04/04/2018   Procedure: URETERAL BIOPSY;  Surgeon: Hollice Espy, MD;  Location: [facility];  Service: Urology;  Laterality: Right;   HPI:  Per admitting H&P: Steve Gilbert  is a 68 y.o. male with a known history of bladder Ca (s/p recent radical cystectomy + ileal conduit + B/L PLND, 08/10/2018) p/w AMS, fever, sepsis,  UTI/pyelonephritis, AKI. Pt AAOx0, responses inappropriate, agitated (but non-combative) delirium, unable to provide Hx/ROS. Narrative obtained from pt's wife at bedside. Hx bladder Ca, pt's Urologist is Dr. Tresa Gilbert. TURBT + R ureteroscopy/stent 03/2018. 4 cycles [facility]AC (Dr. Rogue Gilbert). Radical cystectomy + ileal conduit + B/L PLND on 08/10/18, D/Ced home 01/30. Has been well since, up until Sat 02/15. Complained of cough/congestion and "feeling cold" though (-) chills/rigors on Sat 02/15, thought he had a sinus infxn. Slept throughout day Sat 02/15, as well as on Sun 02/16. Wife noticed bed was wet (ileostomy bag came loose and leaked onto bed) on Sun afternoon, asked pt to stand up to get cleaned up. Pt confused, responses, "made no sense". Too weak to stand up, nearly fell out of bed. EMS called. TMax 105.83F, tachycardic, SIRS (+). CT A/P (+) "Prominent left perinephric inflammation/fluid stranding. Findings are suspicious for ascending urinary tract infection." (+) sepsis.   Assessment / Plan / Recommendation Clinical Impression  This 68 y/o male was easily aroused and agreeable to brief bedside swallow exam with encouragement but reported he is not hungry. He appears to present with functional swallowing abilities at bedside. No overt s/s aspiration observed with any consistency tested (thin liquid, puree, solid). Oral phase and oral mech exam WFL. Adequate oral prep/coordination and A-P transit time with all consistencies tested (thin, puree, solid). Very minimal oral residue observed on tongue. Oral cavity was observed to be very dry prior to PO trials. No overt s/s pharyngeal dysphagia observed. Swallow initiation appeared timely. Vocal quality remained clear thoughout evaluation. Pt and  wife deny any hx of dysphagia and deny any s/s aspiration with current regular diet. Educated pt and wife re: aspiration precautions and general safe swallow recommendations. Pt and wife stated agreement. Recommend continue  with current regular (RENAL) diet with thin liquids, may continue to give meds whole with thin liquid. Recommend frequent oral care. Discussed results of evaluation with nursing, nursing in agreement. SLP to f/u with toleration of diet and sign off if pt presents with no further needs identified. SLP Visit Diagnosis: Dysphagia, unspecified (R13.10)    Aspiration Risk  Mild aspiration risk    Diet Recommendation Regular;Thin liquid   Liquid Administration via: Cup;Straw Medication Administration: Whole meds with liquid Supervision: Staff to assist with self feeding Compensations: Minimize environmental distractions;Slow rate;Small sips/bites Postural Changes: Seated upright at 90 degrees;Remain upright for at least 30 minutes after po intake    Other  Recommendations Oral Care Recommendations: Oral care QID   Follow up Recommendations None      Frequency and Duration min 1 x/week  1 week       Prognosis Prognosis for Safe Diet Advancement: Good      Swallow Study   General Date of Onset: 09/03/18 HPI: Per admitting H&P: Steve Gilbert  is a 68 y.o. male with a known history of bladder Ca (s/p recent radical cystectomy + ileal conduit + B/L PLND, 08/10/2018) p/w AMS, fever, sepsis, UTI/pyelonephritis, AKI. Pt AAOx0, responses inappropriate, agitated (but non-combative) delirium, unable to provide Hx/ROS. Narrative obtained from pt's wife at bedside. Hx bladder Ca, pt's Urologist is Dr. Tresa Gilbert. TURBT + R ureteroscopy/stent 03/2018. 4 cycles MVAC (Dr. Rogue Gilbert). Radical cystectomy + ileal conduit + B/L PLND on 08/10/18, D/Ced home 01/30. Has been well since, up until Sat 02/15. Complained of cough/congestion and "feeling cold" though (-) chills/rigors on Sat 02/15, thought he had a sinus infxn. Slept throughout day Sat 02/15, as well as on Sun 02/16. Wife noticed bed was wet (ileostomy bag came loose and leaked onto bed) on Sun afternoon, asked pt to stand up to get cleaned up. Pt  confused, responses, "made no sense". Too weak to stand up, nearly fell out of bed. EMS called. TMax 105.52F, tachycardic, SIRS (+). CT A/P (+) "Prominent left perinephric inflammation/fluid stranding. Findings are suspicious for ascending urinary tract infection." (+) sepsis. Type of Study: Bedside Swallow Evaluation Diet Prior to this Study: Regular;Thin liquids Temperature Spikes Noted: No(declining since admission) Respiratory Status: Nasal cannula History of Recent Intubation: No Behavior/Cognition: Cooperative;Pleasant mood;Lethargic/Drowsy Oral Cavity Assessment: Dry Oral Care Completed by SLP: No Oral Cavity - Dentition: Adequate natural dentition Self-Feeding Abilities: Needs assist(currently wearing mitts due to previous agitation) Patient Positioning: Upright in bed Baseline Vocal Quality: Low vocal intensity Volitional Cough: Strong    Oral/Motor/Sensory Function Overall Oral Motor/Sensory Function: Within functional limits   Ice Chips Ice chips: Within functional limits Presentation: Spoon   Thin Liquid Thin Liquid: Within functional limits Presentation: Cup;Straw    Nectar Thick Nectar Thick Liquid: Not tested   Honey Thick Honey Thick Liquid: Not tested   Puree Puree: Within functional limits Presentation: Spoon   Solid     Solid: Within functional limits Presentation: Spoon      Jamileth Putzier, MA, CCC-SLP 09/03/2018,9:39 AM

## 2018-09-03 NOTE — Progress Notes (Signed)
CODE SEPSIS - PHARMACY COMMUNICATION  **Broad Spectrum Antibiotics should be administered within 1 hour of Sepsis diagnosis**  Time Code Sepsis Called/Page Received: n/a  Antibiotics Ordered: vanc/cefepime  Time of 1st antibiotic administration: 2125  Additional action taken by pharmacy:   If necessary, Name of Provider/Nurse Contacted:     Tobie Lords ,PharmD Clinical Pharmacist  09/03/2018  1:06 AM

## 2018-09-03 NOTE — H&P (Signed)
[facility] NAME: Steve Gilbert    MR#:  [id number]  DATE OF BIRTH:  Nov 27, 1950  DATE OF ADMISSION:  09/02/2018  PRIMARY CARE PHYSICIAN: Kirk Ruths, MD   REQUESTING/REFERRING PHYSICIAN: Nance Pear, MD  CHIEF COMPLAINT:   Chief Complaint  Patient presents with  . Code Sepsis    HISTORY OF PRESENT ILLNESS:  Steve Gilbert  is a 68 y.o. male with a known history of bladder Ca (s/p recent radical cystectomy + ileal conduit + B/L PLND, 08/10/2018) p/w AMS, fever, sepsis, UTI/pyelonephritis, AKI. Pt AAOx0, responses inappropriate, agitated (but non-combative) delirium, unable to provide Hx/ROS. Narrative obtained from pt's wife at bedside. Hx bladder Ca, pt's Urologist is Dr. Tresa Moore. TURBT + R ureteroscopy/stent 03/2018. 4 cycles MVAC (Dr. Rogue Bussing). Radical cystectomy + ileal conduit + B/L PLND on 08/10/18, D/Ced home 01/30. Has been well since, up until Sat 02/15. Complained of cough/congestion and "feeling cold" [though (-) chills/rigors] on Sat 02/15, thought he had a sinus infxn. Slept throughout day Sat 02/15, as well as on Sun 02/16. Wife noticed bed was wet (ileostomy bag came loose and leaked onto bed) on Sun afternoon, asked pt to stand up to get cleaned up. Pt confused, responses, "made no sense". Too weak to stand up, nearly fell out of bed. EMS called. TMax 105.46F, tachycardic, SIRS (+). CT A/P (+) "Prominent left perinephric inflammation/fluid stranding. Findings are suspicious for ascending urinary tract infection." (+) sepsis.  PAST MEDICAL HISTORY:   Past Medical History:  Diagnosis Date  . Acid reflux   . Allergy   . Cancer ([place])   . COPD (chronic obstructive pulmonary disease) ([place])   . High cholesterol   . Hyperlipidemia 04/03/2018  . Hypertension   . Pneumonia     PAST SURGICAL HISTORY:   Past Surgical History:  Procedure Laterality Date  . CYSTOSCOPY W/ URETERAL STENT REMOVAL Right 08/10/2018    Procedure: CYSTOSCOPY WITH STENT REMOVAL;  Surgeon: Alexis Frock, MD;  Location: [facility];  Service: Urology;  Laterality: Right;  . CYSTOSCOPY WITH BIOPSY N/A 04/04/2018   Procedure: CYSTOSCOPY WITH TURBT;  Surgeon: Hollice Espy, MD;  Location: [facility];  Service: Urology;  Laterality: N/A;  . CYSTOSCOPY WITH URETEROSCOPY AND STENT PLACEMENT Right 04/04/2018   Procedure: CYSTOSCOPY WITH URETEROSCOPY AND STENT PLACEMENT;  Surgeon: Hollice Espy, MD;  Location: [facility];  Service: Urology;  Laterality: Right;  . NASAL SINUS SURGERY    . PORTA CATH INSERTION N/A 04/23/2018   Procedure: PORTA CATH INSERTION;  Surgeon: Algernon Huxley, MD;  Location: [facility];  Service: Cardiovascular;  Laterality: N/A;  . TRANSURETHRAL RESECTION OF PROSTATE N/A 04/04/2018   Procedure: TRANSURETHRAL RESECTION OF THE PROSTATE (TURP);  Surgeon: Hollice Espy, MD;  Location: [facility];  Service: Urology;  Laterality: N/A;  . URETERAL BIOPSY Right 04/04/2018   Procedure: URETERAL BIOPSY;  Surgeon: Hollice Espy, MD;  Location: [facility];  Service: Urology;  Laterality: Right;    SOCIAL HISTORY:   Social History   Tobacco Use  . Smoking status: Former Smoker    Packs/day: 1.50    Years: 30.00    Pack years: 45.00    Types: Cigarettes    Last attempt to quit: 2009    Years since quitting: 11.1  . Smokeless tobacco: Former Network engineer Use Topics  . Alcohol use: Yes    Alcohol/week: 7.0 standard drinks    Types: 5 Cans of beer, 2 Standard  drinks or equivalent per week    Comment: ocassional     FAMILY HISTORY:   Family History  Problem Relation Age of Onset  . Diabetes Mother   . Heart disease Father   . Diabetes Brother   . Heart attack Brother   . Heart disease Brother     DRUG ALLERGIES:   Allergies  Allergen Reactions  . Contrast Media [Iodinated Diagnostic Agents] Itching and Rash    REVIEW OF SYSTEMS:   Review of Systems  Unable to perform ROS: Mental status change    Respiratory: Positive for cough.   Neurological: Positive for weakness.   AMS, AAOx0, agitated delirium. MEDICATIONS AT HOME:   Prior to Admission medications   Medication Sig Start Date End Date Taking? Authorizing Provider  albuterol (PROVENTIL HFA;VENTOLIN HFA) 108 (90 Base) MCG/ACT inhaler Inhale 2 puffs into the lungs every 6 (six) hours as needed for wheezing or shortness of breath. 05/09/18  Yes Cammie Sickle, MD  amLODipine (NORVASC) 10 MG tablet Take 10 mg by mouth daily.   Yes [provider]  aspirin EC 81 MG tablet Take 81 mg by mouth daily.   Yes [provider]  atorvastatin (LIPITOR) 20 MG tablet Take 20 mg by mouth daily.   Yes [provider]  azelastine (ASTELIN) 0.1 % nasal spray Place 1 spray into both nostrils 2 (two) times daily as needed (congestion). Use in each nostril as directed    Yes [provider]  diphenhydrAMINE (BENADRYL) 25 MG tablet Take 25 mg by mouth daily as needed for allergies.   Yes [provider]  docusate sodium (COLACE) 100 MG capsule TAKE 1 CAPSULE BY MOUTH TWICE A DAY 05/04/18  Yes McGowan, Shannon A, PA-C  Fluticasone-Salmeterol (ADVAIR) 250-50 MCG/DOSE AEPB Inhale 1 puff into the lungs 2 (two) times daily as needed (shortness of breath).    Yes [provider]  hydrochlorothiazide (HYDRODIURIL) 12.5 MG tablet Take 12.5 mg by mouth daily.  03/21/18  Yes [provider]  metoprolol tartrate (LOPRESSOR) 25 MG tablet TAKE 1 TABLET(25 MG) BY MOUTH TWICE DAILY 08/22/18  Yes Cammie Sickle, MD  pantoprazole (PROTONIX) 40 MG tablet Take 40 mg by mouth every evening.    Yes [provider]  tiotropium (SPIRIVA) 18 MCG inhalation capsule Place 18 mcg into inhaler and inhale daily as needed (congestion).    Yes [provider]  HYDROcodone-acetaminophen (NORCO) 5-325 MG tablet Take 2 tablets by mouth every 6 (six) hours as needed for moderate pain. Patient not  taking: Reported on 09/02/2018 08/16/18   Alexis Frock, MD  lidocaine-prilocaine (EMLA) cream Apply 1 application topically as needed. Patient not taking: Reported on 09/02/2018 04/17/18   Cammie Sickle, MD  Multiple Vitamins-Minerals (MULTIVITAMIN WITH MINERALS) tablet Take 1 tablet by mouth daily.    [provider]  ondansetron (ZOFRAN) 8 MG tablet Take 1 tablet (8 mg total) by mouth every 8 (eight) hours as needed for nausea or vomiting. Patient not taking: Reported on 07/04/2018 04/17/18   Cammie Sickle, MD  prochlorperazine (COMPAZINE) 10 MG tablet Take 1 tablet (10 mg total) by mouth every 6 (six) hours as needed for nausea or vomiting. Patient not taking: Reported on 06/18/2018 04/17/18   Cammie Sickle, MD      VITAL SIGNS:  Blood pressure 126/68, pulse (!) 132, temperature (!) 103.1 F (39.5 C), temperature source Oral, resp. rate (!) 0, height 5\' 7"  (1.702 m), weight 93.9 kg, SpO2 98 %.  PHYSICAL EXAMINATION:  Physical Exam Constitutional:      General: He is not in acute distress.    Appearance: He is ill-appearing, toxic-appearing and diaphoretic.  HENT:     Head: Atraumatic.     Mouth/Throat:     Mouth: Mucous membranes are dry.     Pharynx: Oropharynx is clear.  Eyes:     General: No scleral icterus.    Extraocular Movements: Extraocular movements intact.     Conjunctiva/sclera: Conjunctivae normal.  Neck:     Musculoskeletal: Normal range of motion.  Cardiovascular:     Rate and Rhythm: Regular rhythm. Tachycardia present.     Heart sounds: Normal heart sounds. No murmur. No friction rub. No gallop.   Pulmonary:     Effort: No respiratory distress.     Breath sounds: Normal breath sounds. No stridor. No wheezing, rhonchi or rales.  Abdominal:     General: Bowel sounds are normal. There is no distension.     Palpations: Abdomen is soft.     Tenderness: There is no abdominal tenderness. There is no guarding or rebound.    Musculoskeletal: Normal range of motion.        General: No swelling or tenderness.     Right lower leg: No edema.     Left lower leg: No edema.  Lymphadenopathy:     Cervical: No cervical adenopathy.  Skin:    General: Skin is warm.     Findings: No erythema or rash.  Neurological:     Mental Status: He is disoriented.     Comments: AMS/agitated delirium (AAOx0).  Psychiatric:        Attention and Perception: He is inattentive.        Behavior: Behavior is agitated.        Cognition and Memory: Cognition is impaired. Memory is impaired.        Judgment: Judgment is impulsive and inappropriate.     Comments: AMS/agitated delirium (AAOx0).    LABORATORY PANEL:   CBC Recent Labs  Lab 09/02/18 2042  WBC 10.0  HGB 12.1*  HCT 36.7*  PLT 154   ------------------------------------------------------------------------------------------------------------------  Chemistries  Recent Labs  Lab 09/02/18 2042 09/03/18 0020  NA 139  --   K 3.1*  --   CL 100  --   CO2 27  --   GLUCOSE 124*  --   BUN 35*  --   CREATININE 2.56*  --   CALCIUM 8.2*  --   MG  --  1.2*  AST 39  --   ALT 22  --   ALKPHOS 64  --   BILITOT 1.7*  --    ------------------------------------------------------------------------------------------------------------------  Cardiac Enzymes No results for input(s): TROPONINI in the last 168 hours. ------------------------------------------------------------------------------------------------------------------  RADIOLOGY:  Ct Abdomen Pelvis Wo Contrast  Result Date: 09/02/2018 CLINICAL DATA:  Bladder removed on January 14th for bladder cancer. Now with fever and vomiting. Altered mental status. EXAM: CT ABDOMEN AND PELVIS WITHOUT CONTRAST TECHNIQUE: Multidetector CT imaging of the abdomen and pelvis was performed following the standard protocol without IV contrast. COMPARISON:  CT abdomen dated 07/06/2018. FINDINGS: Lower chest: No acute abnormality.  Hepatobiliary: No focal liver abnormality is seen. No gallstones, gallbladder wall thickening, or biliary dilatation. Pancreas: Pancreas is unremarkable. Spleen: Normal in size without focal abnormality. Adrenals/Urinary Tract: Status post bladder resection and ileal conduit creation. Catheter in place at the ileal conduit outlet at the RIGHT lower abdominal wall. Bilateral hydronephrosis and hydroureter, mild to moderate in degree.  LEFT perinephric inflammation/fluid stranding. Stomach/Bowel: No dilated large or small bowel loops. Stomach appears normal. Appendix is normal. Vascular/Lymphatic: Aortic atherosclerosis. No enlarged lymph nodes seen. Reproductive: Apparent prostatectomy with the aforementioned bladder resection. Other: No abscess collection seen. No free intraperitoneal air. Expected postsurgical changes within the subcutaneous soft tissues of the lower abdomen and pelvis. Musculoskeletal: No acute or suspicious osseous finding. IMPRESSION: 1. Status post bladder resection and ileal conduit creation. Catheter in place at the ileal conduit outlet at the RIGHT lower abdominal wall. 2. Prominent LEFT perinephric inflammation/fluid stranding. Findings are suspicious for ascending urinary tract infection. Recommend correlation with urinalysis. 3. Bilateral hydronephrosis and hydroureter, mild to moderate in degree. This may be expected given the recent ileal conduit creation. Normal urine output? 4. No abscess collection seen. No free intraperitoneal air. No bowel obstruction. Aortic Atherosclerosis (ICD10-I70.0). Electronically Signed   By: Franki Cabot M.D.   On: 09/02/2018 23:07   Dg Chest Port 1 View  Result Date: 09/02/2018 CLINICAL DATA:  Bladder removed on January 14th for bladder cancer. Fever and altered mental status. EXAM: PORTABLE CHEST 1 VIEW COMPARISON:  Chest x-ray dated 05/15/2018. FINDINGS: RIGHT IJ Port-A-Cath appears stable in position with tip at the level of the upper SVC. Heart  size and mediastinal contours are stable. Lungs are clear. No pleural effusions seen. Osseous structures about the chest are unremarkable. IMPRESSION: No active disease.  No evidence of pneumonia. Electronically Signed   By: Franki Cabot M.D.   On: 09/02/2018 21:31   IMPRESSION AND PLAN:   A/P: 26R w/ PMHx bladder Ca (s/p recent radical cystectomy + ileal conduit + B/L PLND, 08/10/2018) p/w AMS, fever, sepsis, UTI/pyelonephritis, AKI. Hypokalemia, hypomagnesemia, hyperglycemia, uremia, hypocalcemia, lactate elevation, normocytic anemia. -Bladder Ca, recent urological surgery, AMS, fever, sepsis, lactate elevation, UTI/pyelonephritis: SIRS (+), AMS, AAOx0. AMS likely 2/2 sepsis + uremia (discussed below). Febrile, toxic. Lactate 2.5. PCT 23.99. CT A/P (+) "Prominent left perinephric inflammation/fluid stranding. Findings are suspicious for ascending urinary tract infection." Zosyn. BCx. Urology consult. CXR (-). Do not suspect meningitis. TSH, B12, Folate, RPR, ammonia. Consider CT head if AMS not improving. -AKI, uremia: AKI and uremia likely contributing to AMS. Cr 2.56 (increased from 1.40 on 08/15/2018). Baseline Cr likely 0.9-1.1 (based on review of prior labwork). IVF. Urine studies (electrolytes, protein, creatinine, urea nitrogen). CT A/P w/, "Bilateral hydronephrosis and hydroureter, mild to moderate in degree. This may be expected given the recent ileal conduit creation." (-) obstructive uropathy. Hold diuretics. Monitor BMP, avoid nephrotoxins. -Hypokalemia, hypomagnesemia: Replete and monitor. -Hypocalcemia: Ionized calcium. -Normocytic anemia: Likely anemia of chronic disease. Stable, low suspicion for active/acute bleed. -c/w other home meds/formulary subs as tolerated. -FEN/GI: Renal diet. -DVT PPx: Heparin. -Code status: Full code. -Disposition: Admission, > 2 midnights.   All the records are reviewed and case discussed with ED provider. Management plans discussed with the patient,  family and they are in agreement.  CODE STATUS: Full code.  TOTAL TIME TAKING CARE OF THIS PATIENT: 75 minutes.    Arta Silence M.D on 09/03/2018 at 2:37 AM  Between 7am to 6pm - Pager - [phone]  After 6pm go to [email or web address] - Technical brewer [facility]  Office  [phone]  CC: Primary care physician; Kirk Ruths, MD   Note: This dictation was prepared with Dragon dictation along with smaller phrase technology. Any transcriptional errors that result from this process are unintentional.

## 2018-09-03 NOTE — Progress Notes (Signed)
[facility] [place] NAME: Steve Gilbert    MR#:  [id number]  DATE OF BIRTH:  Dec 18, 1950  SUBJECTIVE:   Patient states he is feeling much better this morning.  Per family at bedside, patient is back to mental status baseline.  Denies any fevers, chills, nausea, vomiting.  States he is very tired because he did not sleep well last night.  REVIEW OF SYSTEMS:  Review of Systems  Constitutional: Positive for malaise/fatigue. Negative for chills and fever.  HENT: Negative for congestion and sore throat.   Eyes: Negative for blurred vision and double vision.  Respiratory: Negative for cough and shortness of breath.   Cardiovascular: Negative for chest pain and palpitations.  Gastrointestinal: Negative for abdominal pain, nausea and vomiting.  Genitourinary: Negative for flank pain and hematuria.  Musculoskeletal: Negative for back pain and neck pain.  Neurological: Positive for weakness. Negative for dizziness, focal weakness and headaches.  Psychiatric/Behavioral: Negative for depression and suicidal ideas.    DRUG ALLERGIES:   Allergies  Allergen Reactions  . Contrast Media [Iodinated Diagnostic Agents] Itching and Rash   VITALS:  Blood pressure 130/86, pulse 100, temperature 100.3 F (37.9 C), temperature source Rectal, resp. rate (!) 31, height 5\' 7"  (1.702 m), weight 93.9 kg, SpO2 100 %. PHYSICAL EXAMINATION:  Physical Exam  General: Sitting up in bed in no acute distress HEENT: Normocephalic, atraumatic, EOMI, no scleral icterus, moist mucous membranes Cardiovascular: RRR, no murmurs, rubs, gallops Lungs: Lungs clear to auscultation bilaterally, no wheezes or crackles Abdomen: Soft, nontender, + mild distention. Ostomy  Back: No CVA tenderness Extremities: No edema Neurologic: Grossly intact LABORATORY PANEL:  Male CBC Recent Labs  Lab 09/03/18 0423  WBC 8.9  HGB 10.9*  HCT 34.5*  PLT 121*    ------------------------------------------------------------------------------------------------------------------ Chemistries  Recent Labs  Lab 09/02/18 2042  09/03/18 0423  NA 139  --  141  K 3.1*  --  3.3*  CL 100  --  109  CO2 27  --  25  GLUCOSE 124*  --  107*  BUN 35*  --  31*  CREATININE 2.56*  --  2.25*  CALCIUM 8.2*  --  7.4*  MG  --    < > 1.4*  AST 39  --   --   ALT 22  --   --   ALKPHOS 64  --   --   BILITOT 1.7*  --   --    < > = values in this interval not displayed.   RADIOLOGY:  Ct Abdomen Pelvis Wo Contrast  Result Date: 09/02/2018 CLINICAL DATA:  Bladder removed on January 14th for bladder cancer. Now with fever and vomiting. Altered mental status. EXAM: CT ABDOMEN AND PELVIS WITHOUT CONTRAST TECHNIQUE: Multidetector CT imaging of the abdomen and pelvis was performed following the standard protocol without IV contrast. COMPARISON:  CT abdomen dated 07/06/2018. FINDINGS: Lower chest: No acute abnormality. Hepatobiliary: No focal liver abnormality is seen. No gallstones, gallbladder wall thickening, or biliary dilatation. Pancreas: Pancreas is unremarkable. Spleen: Normal in size without focal abnormality. Adrenals/Urinary Tract: Status post bladder resection and ileal conduit creation. Catheter in place at the ileal conduit outlet at the RIGHT lower abdominal wall. Bilateral hydronephrosis and hydroureter, mild to moderate in degree. LEFT perinephric inflammation/fluid stranding. Stomach/Bowel: No dilated large or small bowel loops. Stomach appears normal. Appendix is normal. Vascular/Lymphatic: Aortic atherosclerosis. No enlarged lymph nodes seen. Reproductive: Apparent prostatectomy with the aforementioned bladder resection. Other: No  abscess collection seen. No free intraperitoneal air. Expected postsurgical changes within the subcutaneous soft tissues of the lower abdomen and pelvis. Musculoskeletal: No acute or suspicious osseous finding. IMPRESSION: 1. Status post  bladder resection and ileal conduit creation. Catheter in place at the ileal conduit outlet at the RIGHT lower abdominal wall. 2. Prominent LEFT perinephric inflammation/fluid stranding. Findings are suspicious for ascending urinary tract infection. Recommend correlation with urinalysis. 3. Bilateral hydronephrosis and hydroureter, mild to moderate in degree. This may be expected given the recent ileal conduit creation. Normal urine output? 4. No abscess collection seen. No free intraperitoneal air. No bowel obstruction. Aortic Atherosclerosis (ICD10-I70.0). Electronically Signed   By: Franki Cabot M.D.   On: 09/02/2018 23:07   Dg Chest Port 1 View  Result Date: 09/03/2018 CLINICAL DATA:  Sepsis.  Fever.  Follow-up exam. EXAM: PORTABLE CHEST 1 VIEW COMPARISON:  09/02/2018 FINDINGS: Cardiac silhouette is normal in size. No mediastinal or hilar masses. No evidence of adenopathy. Clear lungs.  No pleural effusion or pneumothorax. Skeletal structures are intact. Right internal jugular central venous Port-A-Cath is stable. IMPRESSION: No active disease. Electronically Signed   By: Lajean Manes M.D.   On: 09/03/2018 08:15   Dg Chest Port 1 View  Result Date: 09/02/2018 CLINICAL DATA:  Bladder removed on January 14th for bladder cancer. Fever and altered mental status. EXAM: PORTABLE CHEST 1 VIEW COMPARISON:  Chest x-ray dated 05/15/2018. FINDINGS: RIGHT IJ Port-A-Cath appears stable in position with tip at the level of the upper SVC. Heart size and mediastinal contours are stable. Lungs are clear. No pleural effusions seen. Osseous structures about the chest are unremarkable. IMPRESSION: No active disease.  No evidence of pneumonia. Electronically Signed   By: Franki Cabot M.D.   On: 09/02/2018 21:31   ASSESSMENT AND PLAN:   Severe sepsis due to UTI and staph aureus bacteremia- meeting sepsis criteria on admission with fever, tachycardia, lactic acidosis. Sepsis improving, lactic acidosis resolved. Patient  had another low grade fever this morning. -CT abd with prominent left perinephric inflammation/stranding suspicious for ascending UTI -Antibiotics narrowed to Ancef today -Follow-up blood cultures -Urine culture not ordered on admission- will try to add this on -ID consult -Urology consult  AKI- likely due to above. Cr improved from 2.56 > 2.25. -Continue IV fluids -Holding home HCTZ -Avoid nephrotoxic agents -Monitor creatinine  History of invasive bladder cancer- s/p radical cystoprostatectomy with ileal conduit diversion 1/24. Follows with Dr. Tresa Moore as an outpatient. -Urology consult -CT abd with bilateral hydronephrosis- per urology, this is expected after his surgery  Hypokalemia/hypomagnesemia -Replete and recheck  Hypertension- BPs normal today -Holding home HCTZ and norvasc -Continue home metoprolol  COPD- stable, no signs of acute exacerbation -Continue home inhalers  Normocytic anemia- likely anemia of chronic disease -Monitor  Hyperlipidemia-stable -Continue home Lipitor   All the records are reviewed and case discussed with Care Management/Social Worker. Management plans discussed with the patient, family and they are in agreement.  CODE STATUS: Full Code  TOTAL TIME TAKING CARE OF THIS PATIENT: 45 minutes.   More than 50% of the time was spent in counseling/coordination of care: YES  POSSIBLE D/C IN 2-3 DAYS, DEPENDING ON CLINICAL CONDITION.   Berna Spare  M.D on 09/03/2018 at 2:19 PM  Between 7am to 6pm - Pager - [phone]  After 6pm go to [email or web address] - Technical brewer [facility]  Office  [phone]  CC: Primary care physician; Kirk Ruths, MD  Note: This dictation was  prepared with Dragon dictation along with smaller phrase technology. Any transcriptional errors that result from this process are unintentional.

## 2018-09-03 NOTE — Progress Notes (Signed)
eLink Physician-Brief Progress Note Patient Name: Steve Gilbert DOB: [birth year] MRN: [id number]   Date of Service  09/03/2018  HPI/Events of Note  Steve Gilbert [name] a 68 yo WM who underwent robotic cystoprostatectomy with ICG sentinal + template lymphadenectomy and ileal conduit 08/10/18 for for Stage III High Grade Muscle Invasive BLadder Cancer with Right Malignant Hydronephrosis. He underwent neoadjuvant chemo with 4 cycles dose-dense MVAC by Dr. Rogue Bussing at [facility].Final path TisN1. He was on po Bactrim through 08/23/2018. Admitted to ICU for severe sepsis  eICU Interventions  Started on Zosyn and receiving 30 cc/kg IV fluids.  Lactic acid 2.3.  Maintain map more than 65.  Consulted urology.  Bilateral hydronephrosis which is expected from the above surgery as per the urologist.      Intervention Category Major Interventions: Sepsis - evaluation and management;Acute renal failure - evaluation and management Intermediate Interventions: Communication with other healthcare providers and/or family Evaluation Type: New Patient Evaluation  Mady Gemma 09/03/2018, 1:38 AM

## 2018-09-04 ENCOUNTER — Inpatient Hospital Stay
Admit: 2018-09-04 | Discharge: 2018-09-04 | Disposition: A | Discharge status: Home / Self Care | Payer: PPO | Attending: Infectious Diseases | Admitting: Infectious Diseases

## 2018-09-04 ENCOUNTER — Encounter
Admission: EM | Disposition: A | Discharge status: Home Health | Payer: Self-pay | Source: Home / Self Care | Attending: Internal Medicine

## 2018-09-04 DIAGNOSIS — Z452 Encounter for adjustment and management of vascular access device: Secondary | ICD-10-CM

## 2018-09-04 DIAGNOSIS — R4182 Altered mental status, unspecified: Secondary | ICD-10-CM

## 2018-09-04 DIAGNOSIS — A419 Sepsis, unspecified organism: Secondary | ICD-10-CM

## 2018-09-04 DIAGNOSIS — I10 Essential (primary) hypertension: Secondary | ICD-10-CM

## 2018-09-04 DIAGNOSIS — E785 Hyperlipidemia, unspecified: Secondary | ICD-10-CM

## 2018-09-04 HISTORY — PX: PORTA CATH REMOVAL: CATH118286

## 2018-09-04 LAB — UREA NITROGEN, URINE: UREA NITROGEN UR: 419 mg/dL

## 2018-09-04 LAB — BASIC METABOLIC PANEL
Anion gap: 6 (ref 5–15)
BUN: 29 mg/dL — ABNORMAL HIGH (ref 8–23)
CO2: 23 mmol/L (ref 22–32)
Calcium: 7.5 mg/dL — ABNORMAL LOW (ref 8.9–10.3)
Chloride: 109 mmol/L (ref 98–111)
Creatinine, Ser: 1.7 mg/dL — ABNORMAL HIGH (ref 0.61–1.24)
GFR calc Af Amer: 47 mL/min — ABNORMAL LOW (ref >60)
GFR calc non Af Amer: 41 mL/min — ABNORMAL LOW (ref >60)
Glucose, Bld: 104 mg/dL — ABNORMAL HIGH (ref 70–99)
Potassium: 3 mmol/L — ABNORMAL LOW (ref 3.5–5.1)
Sodium: 138 mmol/L (ref 135–145)

## 2018-09-04 LAB — C DIFFICILE QUICK SCREEN W PCR REFLEX
C Diff antigen: NEGATIVE
C Diff interpretation: NOT DETECTED
C Diff toxin: NEGATIVE

## 2018-09-04 LAB — CBC
HCT: 29.8 % — ABNORMAL LOW (ref 39.0–52.0)
Hemoglobin: 9.7 g/dL — ABNORMAL LOW (ref 13.0–17.0)
MCH: 30.5 pg (ref 26.0–34.0)
MCHC: 32.6 g/dL (ref 30.0–36.0)
MCV: 93.7 fL (ref 80.0–100.0)
Platelets: 102 10*3/uL — ABNORMAL LOW (ref 150–400)
RBC: 3.18 MIL/uL — ABNORMAL LOW (ref 4.22–5.81)
RDW: 12.5 % (ref 11.5–15.5)
WBC: 5.3 10*3/uL (ref 4.0–10.5)
nRBC: 0 % (ref 0.0–0.2)

## 2018-09-04 LAB — HIV ANTIBODY (ROUTINE TESTING W REFLEX): HIV Screen 4th Generation wRfx: NONREACTIVE

## 2018-09-04 LAB — ECHOCARDIOGRAM COMPLETE
Height: 67 in
Weight: 3312.19 oz

## 2018-09-04 LAB — PROCALCITONIN: Procalcitonin: 24.54 ng/mL

## 2018-09-04 LAB — RPR: RPR Ser Ql: NONREACTIVE

## 2018-09-04 LAB — CALCIUM, IONIZED: Calcium, Ionized, Serum: 4.5 mg/dL (ref 4.5–5.6)

## 2018-09-04 SURGERY — PORTA CATH REMOVAL
Anesthesia: Moderate Sedation

## 2018-09-04 MED ORDER — MIDAZOLAM HCL 5 MG/5ML IJ SOLN
INTRAMUSCULAR | Status: AC
  Filled 2018-09-04: qty 5

## 2018-09-04 MED ORDER — SODIUM CHLORIDE 0.9 % IV SOLN: INTRAVENOUS | Status: DC

## 2018-09-04 MED ORDER — CEFAZOLIN SODIUM-DEXTROSE 2-4 GM/100ML-% IV SOLN
2.0000 g | Freq: Three times a day (TID) | INTRAVENOUS | Status: DC
  Administered 2018-09-04 – 2018-09-06 (×7): 2 g via INTRAVENOUS
  Filled 2018-09-04 (×13): qty 100

## 2018-09-04 MED ORDER — MIDAZOLAM HCL 2 MG/2ML IJ SOLN
INTRAMUSCULAR | Status: DC | PRN
  Administered 2018-09-04 (×2): 2 mg via INTRAVENOUS

## 2018-09-04 MED ORDER — POTASSIUM CHLORIDE CRYS ER 20 MEQ PO TBCR
40.0000 meq | EXTENDED_RELEASE_TABLET | Freq: Two times a day (BID) | ORAL | Status: AC
  Administered 2018-09-04 (×2): 40 meq via ORAL
  Filled 2018-09-04 (×2): qty 2

## 2018-09-04 SURGICAL SUPPLY — 5 items
DERMABOND ADVANCED (GAUZE/BANDAGES/DRESSINGS) ×1
DERMABOND ADVANCED .7 DNX12 (GAUZE/BANDAGES/DRESSINGS) ×1 IMPLANT
PACK ANGIOGRAPHY (CUSTOM PROCEDURE TRAY) ×2 IMPLANT
SUT MNCRL AB 4-0 PS2 18 (SUTURE) ×2 IMPLANT
SUT VICRYL+ 3-0 36IN CT-1 (SUTURE) ×2 IMPLANT

## 2018-09-04 NOTE — Consult Note (Signed)
[facility] Consult Note  MRN : [id number]  Steve Gilbert is a 68 y.o. ([birth year]) male who presents with chief complaint of  Chief Complaint  Patient presents with  . Code Sepsis   History of Present Illness:  The patient is a 68 year old male with a past medical history of COPD, hypertension, GERD, hyperlipidemia, bladder cancer who presented to the [facility] emergency department with altered mental status.  Patient was brought in by his family members who stated his symptoms started yesterday.  Patient notes his family members felt he was confused and lethargic. The patient notes feeling progressively weaker over the last week.  The patient endorses a history of nausea and nonbilious vomiting times once yesterday.  Upon presentation to the emergency department, the patient was tachycardic and febrile to 103.  The patient was admitted for sepsis.   Patient with improved symptoms today.  Denies any fever, nausea vomiting.  Patient stills feels very "tired".  The patient underwent robotic cystoprostatectomy with ICG sentinal + template lymphadenectomy and ileal conduit 08/10/18 for for Stage III High Grade Muscle Invasive BLadder Cancer with Right Malignant Hydronephrosis. He underwent neoadjuvant chemo with 4 cycles dose-dense MVAC by Dr. Rogue Bussing at [facility].Final path TisN1. He was on PO Bactrim through 08/23/2018.  In the ED had a temperature of 105, BP of 161/85, respiratory rate of 31.  Blood cultures were sent.  His white count was 10, Hb of 12.1, creatinine of 2.56, lactate of 2.5 and pro calcitonin of 23.99.  UA showed more than 50 WBC and hence he had a CT abdomen and pelvis and that showed prominent left perinephric inflammation/fluid stranding and bilateral hydronephrosis and hydroureter.  No abscess collection seen.  The patient currently has a Port-A-Cath.  The patient has been seen by multiple specialties.   Possible source of his sepsis could be his Port-A-Cath.  Vascular surgery was consulted by Dr. Brett Albino for Port-A-Cath removal.  Current Facility-Administered Medications  Medication Dose Route Frequency Provider Last Rate Last Dose  . acetaminophen (TYLENOL) tablet 650 mg  650 mg Oral Q6H PRN Arta Silence, MD   650 mg at 09/04/18 1353   Or  . acetaminophen (TYLENOL) suppository 650 mg  650 mg Rectal Q6H PRN Arta Silence, MD   650 mg at 09/03/18 0503  . albuterol (PROVENTIL) (2.5 MG/3ML) 0.083% nebulizer solution 2.5 mg  2.5 mg Nebulization Q6H PRN Arta Silence, MD      . aspirin EC tablet 81 mg  81 mg Oral Daily Arta Silence, MD   81 mg at 09/04/18 0942  . atorvastatin (LIPITOR) tablet 20 mg  20 mg Oral Daily Arta Silence, MD   20 mg at 09/03/18 1718  . bisacodyl (DULCOLAX) EC tablet 5 mg  5 mg Oral Daily PRN Arta Silence, MD      . ceFAZolin (ANCEF) IVPB 2g/100 mL premix  2 g Intravenous Q8H Berton Mount, RPH      . heparin injection 5,000 Units  5,000 Units Subcutaneous Q8H Arta Silence, MD   5,000 Units at 09/04/18 0500  . lactated ringers infusion   Intravenous Continuous Wilhelmina Mcardle, MD 75 mL/hr at 09/04/18 1158    . metoprolol tartrate (LOPRESSOR) tablet 12.5 mg  12.5 mg Oral BID Arta Silence, MD   12.5 mg at 09/04/18 0942  . mometasone-formoterol (DULERA) 200-5 MCG/ACT inhaler 2 puff  2 puff Inhalation BID Arta Silence, MD   2 puff at 09/04/18 0943  .  ondansetron (ZOFRAN) injection 4 mg  4 mg Intravenous Q6H PRN Arta Silence, MD      . pantoprazole (PROTONIX) EC tablet 40 mg  40 mg Oral QPM Arta Silence, MD   40 mg at 09/03/18 1718  . potassium chloride SA (K-DUR,KLOR-CON) CR tablet 40 mEq  40 mEq Oral BID Wilhelmina Mcardle, MD   40 mEq at 09/04/18 1051  . senna-docusate (Senokot-S) tablet 1 tablet  1 tablet Oral QHS PRN Arta Silence, MD      . sodium chloride flush (NS) 0.9 % injection 10-40 mL   10-40 mL Intracatheter Q12H Arta Silence, MD   30 mL at 09/04/18 1059  . sodium chloride flush (NS) 0.9 % injection 10-40 mL  10-40 mL Intracatheter PRN Arta Silence, MD       Past Medical History:  Diagnosis Date  . Acid reflux   . Allergy   . Cancer ([place])   . COPD (chronic obstructive pulmonary disease) ([place])   . High cholesterol   . Hyperlipidemia 04/03/2018  . Hypertension   . Pneumonia    Past Surgical History:  Procedure Laterality Date  . CYSTOSCOPY W/ URETERAL STENT REMOVAL Right 08/10/2018   Procedure: CYSTOSCOPY WITH STENT REMOVAL;  Surgeon: Alexis Frock, MD;  Location: [facility];  Service: Urology;  Laterality: Right;  . CYSTOSCOPY WITH BIOPSY N/A 04/04/2018   Procedure: CYSTOSCOPY WITH TURBT;  Surgeon: Hollice Espy, MD;  Location: [facility];  Service: Urology;  Laterality: N/A;  . CYSTOSCOPY WITH URETEROSCOPY AND STENT PLACEMENT Right 04/04/2018   Procedure: CYSTOSCOPY WITH URETEROSCOPY AND STENT PLACEMENT;  Surgeon: Hollice Espy, MD;  Location: [facility];  Service: Urology;  Laterality: Right;  . NASAL SINUS SURGERY    . PORTA CATH INSERTION N/A 04/23/2018   Procedure: PORTA CATH INSERTION;  Surgeon: Algernon Huxley, MD;  Location: [facility];  Service: Cardiovascular;  Laterality: N/A;  . TRANSURETHRAL RESECTION OF PROSTATE N/A 04/04/2018   Procedure: TRANSURETHRAL RESECTION OF THE PROSTATE (TURP);  Surgeon: Hollice Espy, MD;  Location: [facility];  Service: Urology;  Laterality: N/A;  . URETERAL BIOPSY Right 04/04/2018   Procedure: URETERAL BIOPSY;  Surgeon: Hollice Espy, MD;  Location: [facility];  Service: Urology;  Laterality: Right;   Social History Social History   Tobacco Use  . Smoking status: Former Smoker    Packs/day: 1.50    Years: 30.00    Pack years: 45.00    Types: Cigarettes    Last attempt to quit: 2009    Years since quitting: 11.1  . Smokeless tobacco: Former Network engineer Use Topics  . Alcohol use: Yes     Alcohol/week: 7.0 standard drinks    Types: 5 Cans of beer, 2 Standard drinks or equivalent per week    Comment: ocassional   . Drug use: Not Currently   Family History Family History  Problem Relation Age of Onset  . Diabetes Mother   . Heart disease Father   . Diabetes Brother   . Heart attack Brother   . Heart disease Brother   Patient denies any family history of peripheral artery disease, venous disease or renal disease.  Allergies  Allergen Reactions  . Contrast Media [Iodinated Diagnostic Agents] Itching and Rash   REVIEW OF SYSTEMS (Negative unless checked)  Constitutional: [] Weight loss  [] Fever  [] Chills Cardiac: [] Chest pain   [] Chest pressure   [] Palpitations   [] Shortness of breath when laying flat   [] Shortness of breath at rest   [] Shortness of  breath with exertion. Vascular:  [] Pain in legs with walking   [] Pain in legs at rest   [] Pain in legs when laying flat   [] Claudication   [] Pain in feet when walking  [] Pain in feet at rest  [] Pain in feet when laying flat   [] History of DVT   [] Phlebitis   [x] Swelling in legs   [] Varicose veins   [] Non-healing ulcers Pulmonary:   [] Uses home oxygen   [] Productive cough   [] Hemoptysis   [] Wheeze  [] COPD   [] Asthma Neurologic:  [] Dizziness  [] Blackouts   [] Seizures   [] History of stroke   [] History of TIA  [] Aphasia   [] Temporary blindness   [] Dysphagia   [x] Weakness or numbness in arms   [x] Weakness or numbness in legs Musculoskeletal:  [] Arthritis   [] Joint swelling   [] Joint pain   [] Low back pain Hematologic:  [] Easy bruising  [] Easy bleeding   [] Hypercoagulable state   [] Anemic  [] Hepatitis Gastrointestinal:  [] Blood in stool   [] Vomiting blood  [x] Gastroesophageal reflux/heartburn   [] Difficulty swallowing. Genitourinary:  [] Chronic kidney disease   [] Difficult urination  [] Frequent urination  [] Burning with urination   [] Blood in urine Skin:  [] Rashes   [] Ulcers   [] Wounds Psychological:  [] History of anxiety   []  History  of major depression.  Physical Examination  Vitals:   09/04/18 1000 09/04/18 1100 09/04/18 1200 09/04/18 1340  BP: 118/73 121/84 117/82 (!) 147/89  Pulse: (!) 108 95 94 99  Resp: (!) 31 (!) 32 (!) 28 20  Temp:   98.9 F (37.2 C) (!) 101.5 F (38.6 C)  TempSrc:   Oral Oral  SpO2: 100% 100% 100% 99%  Weight:      Height:       Body mass index is 32.42 kg/m. Gen:  WD/WN, NAD Head: Shorter/AT, No temporalis wasting. Prominent temp pulse not noted. Ear/Nose/Throat: Hearing grossly intact, nares w/o erythema or drainage, oropharynx w/o Erythema/Exudate Eyes: Sclera non-icteric, conjunctiva clear Neck: Trachea midline.  No JVD.  Pulmonary:  Good air movement, respirations not labored, equal bilaterally.  Cardiac: RRR, normal S1, S2. Chest: Right Port: Without erythema or drainage Vascular:  Vessel Right Left  Radial Palpable Palpable  Ulnar Palpable Palpable                               Gastrointestinal: soft, non-tender/non-distended. No guarding/reflex.  Musculoskeletal: M/S 5/5 throughout.  Extremities without ischemic changes.  No deformity or atrophy.  Mild to moderate edema. Neurologic: Sensation grossly intact in extremities.  Symmetrical.  Speech is fluent. Motor exam as listed above. Psychiatric: Judgment intact, Mood & affect appropriate for pt's clinical situation. Dermatologic: No rashes or ulcers noted.  No cellulitis or open wounds. Lymph : No Cervical, Axillary, or Inguinal lymphadenopathy.  CBC Lab Results  Component Value Date   WBC 5.3 09/04/2018   HGB 9.7 (L) 09/04/2018   HCT 29.8 (L) 09/04/2018   MCV 93.7 09/04/2018   PLT 102 (L) 09/04/2018   BMET    Component Value Date/Time   NA 138 09/04/2018 0654   K 3.0 (L) 09/04/2018 0654   CL 109 09/04/2018 0654   CO2 23 09/04/2018 0654   GLUCOSE 104 (H) 09/04/2018 0654   BUN 29 (H) 09/04/2018 0654   CREATININE 1.70 (H) 09/04/2018 0654   CALCIUM 7.5 (L) 09/04/2018 0654   GFRNONAA 41 (L) 09/04/2018 0654    GFRAA 47 (L) 09/04/2018 0654   Estimated Creatinine Clearance:  46 mL/min (A) (by C-G formula based on SCr of 1.7 mg/dL (H)).  COAG Lab Results  Component Value Date   INR 1.27 09/02/2018   Radiology Ct Abdomen Pelvis Wo Contrast  Result Date: 09/02/2018 CLINICAL DATA:  Bladder removed on January 14th for bladder cancer. Now with fever and vomiting. Altered mental status. EXAM: CT ABDOMEN AND PELVIS WITHOUT CONTRAST TECHNIQUE: Multidetector CT imaging of the abdomen and pelvis was performed following the standard protocol without IV contrast. COMPARISON:  CT abdomen dated 07/06/2018. FINDINGS: Lower chest: No acute abnormality. Hepatobiliary: No focal liver abnormality is seen. No gallstones, gallbladder wall thickening, or biliary dilatation. Pancreas: Pancreas is unremarkable. Spleen: Normal in size without focal abnormality. Adrenals/Urinary Tract: Status post bladder resection and ileal conduit creation. Catheter in place at the ileal conduit outlet at the RIGHT lower abdominal wall. Bilateral hydronephrosis and hydroureter, mild to moderate in degree. LEFT perinephric inflammation/fluid stranding. Stomach/Bowel: No dilated large or small bowel loops. Stomach appears normal. Appendix is normal. Vascular/Lymphatic: Aortic atherosclerosis. No enlarged lymph nodes seen. Reproductive: Apparent prostatectomy with the aforementioned bladder resection. Other: No abscess collection seen. No free intraperitoneal air. Expected postsurgical changes within the subcutaneous soft tissues of the lower abdomen and pelvis. Musculoskeletal: No acute or suspicious osseous finding. IMPRESSION: 1. Status post bladder resection and ileal conduit creation. Catheter in place at the ileal conduit outlet at the RIGHT lower abdominal wall. 2. Prominent LEFT perinephric inflammation/fluid stranding. Findings are suspicious for ascending urinary tract infection. Recommend correlation with urinalysis. 3. Bilateral  hydronephrosis and hydroureter, mild to moderate in degree. This may be expected given the recent ileal conduit creation. Normal urine output? 4. No abscess collection seen. No free intraperitoneal air. No bowel obstruction. Aortic Atherosclerosis (ICD10-I70.0). Electronically Signed   By: Franki Cabot M.D.   On: 09/02/2018 23:07   Dg Chest Port 1 View  Result Date: 09/03/2018 CLINICAL DATA:  Sepsis.  Fever.  Follow-up exam. EXAM: PORTABLE CHEST 1 VIEW COMPARISON:  09/02/2018 FINDINGS: Cardiac silhouette is normal in size. No mediastinal or hilar masses. No evidence of adenopathy. Clear lungs.  No pleural effusion or pneumothorax. Skeletal structures are intact. Right internal jugular central venous Port-A-Cath is stable. IMPRESSION: No active disease. Electronically Signed   By: Lajean Manes M.D.   On: 09/03/2018 08:15   Dg Chest Port 1 View  Result Date: 09/02/2018 CLINICAL DATA:  Bladder removed on January 14th for bladder cancer. Fever and altered mental status. EXAM: PORTABLE CHEST 1 VIEW COMPARISON:  Chest x-ray dated 05/15/2018. FINDINGS: RIGHT IJ Port-A-Cath appears stable in position with tip at the level of the upper SVC. Heart size and mediastinal contours are stable. Lungs are clear. No pleural effusions seen. Osseous structures about the chest are unremarkable. IMPRESSION: No active disease.  No evidence of pneumonia. Electronically Signed   By: Franki Cabot M.D.   On: 09/02/2018 21:31   Assessment/Plan The patient is a 68 year old male with a past medical history of COPD, hypertension, GERD, hyperlipidemia, bladder cancer who presented to the [facility] emergency department with altered mental status admitted with sepsis 1. Sepsis: Patient seen by multiple specialties.  Patient with severe sepsis and bacteremia.  Patient has a Port-A-Cath which was being used for chemotherapy.  Consulted by medicine for Port-A-Cath removal as this may possibly be a source of his  sepsis.  We will plan on removal today with Dr. Delana Meyer.  Procedure, risks and benefits explained to the patient.  All questions answered.  Patient wishes  to proceed. 2. Hyperlipidemia: Encouraged good control as its slows the progression of atherosclerotic disease 3. Hypertension: Encouraged good control as its slows the progression of atherosclerotic disease  Discussed with Dr. Francene Castle, PA-C  09/04/2018 3:17 PM  This note was created with Dragon medical transcription system.  Any error is purely unintentional.

## 2018-09-04 NOTE — Progress Notes (Signed)
ID Not in his room Pt getting PORT out

## 2018-09-04 NOTE — Progress Notes (Signed)
Pt is MedSurg floor overflow (awaiting transfer since yesterday).  PCCM service did not see officially on this day.  We are available as needed.  Merton Border, MD PCCM service Mobile [phone] Pager [phone] 09/04/2018 11:44 AM

## 2018-09-04 NOTE — Progress Notes (Addendum)
[facility] [place] NAME: Steve Gilbert    MR#:  [id number]  DATE OF BIRTH:  04-01-1951  SUBJECTIVE:   Patient feeling well this morning.  Denies any fevers, chills, nausea, vomiting.  Still feeling very tired.  REVIEW OF SYSTEMS:  Review of Systems  Constitutional: Positive for malaise/fatigue. Negative for chills and fever.  HENT: Negative for congestion and sore throat.   Eyes: Negative for blurred vision and double vision.  Respiratory: Negative for cough and shortness of breath.   Cardiovascular: Negative for chest pain and palpitations.  Gastrointestinal: Negative for abdominal pain, nausea and vomiting.  Genitourinary: Negative for flank pain and hematuria.  Musculoskeletal: Negative for back pain and neck pain.  Neurological: Positive for weakness. Negative for dizziness, focal weakness and headaches.  Psychiatric/Behavioral: Negative for depression and suicidal ideas.    DRUG ALLERGIES:   Allergies  Allergen Reactions  . Contrast Media [Iodinated Diagnostic Agents] Itching and Rash   VITALS:  Blood pressure (!) 147/89, pulse 99, temperature (!) 101.5 F (38.6 C), temperature source Oral, resp. rate 20, height 5\' 7"  (1.702 m), weight 93.9 kg, SpO2 99 %. PHYSICAL EXAMINATION:  Physical Exam  General: Sitting up in bed in no acute distress HEENT: Normocephalic, atraumatic, EOMI, no scleral icterus, moist mucous membranes Cardiovascular: RRR, no murmurs, rubs, gallops Lungs: Lungs clear to auscultation bilaterally, no wheezes or crackles Abdomen: Soft, nontender, + mild distention. +Ostomy pink with yellow urine present in bag. Back: No CVA tenderness Extremities: No edema, cyanosis, clubbing Neurologic: CN 2-12 grossly intact, no focal deficits, sensation intact throughout Skin: No rashes or lesions on exposed skin LABORATORY PANEL:  Male CBC Recent Labs  Lab 09/04/18 0654  WBC 5.3  HGB 9.7*  HCT 29.8*  PLT  102*   ------------------------------------------------------------------------------------------------------------------ Chemistries  Recent Labs  Lab 09/02/18 2042  09/03/18 0423  09/04/18 0654  NA 139  --  141  --  138  K 3.1*  --  3.3*   < > 3.0*  CL 100  --  109  --  109  CO2 27  --  25  --  23  GLUCOSE 124*  --  107*  --  104*  BUN 35*  --  31*  --  29*  CREATININE 2.56*  --  2.25*  --  1.70*  CALCIUM 8.2*  --  7.4*  --  7.5*  MG  --    < > 1.4*  --   --   AST 39  --   --   --   --   ALT 22  --   --   --   --   ALKPHOS 64  --   --   --   --   BILITOT 1.7*  --   --   --   --    < > = values in this interval not displayed.   RADIOLOGY:  No results found. ASSESSMENT AND PLAN:   Staph aureus UTI and bacteremia- meeting sepsis criteria on admission with fever, tachycardia, lactic acidosis. Sepsis improving. Had a fever to 101.40F this afternoon. -Continue Ancef, given a dose of Levaquin yesterday per ID -Follow-up blood and urine cultures -Repeat blood cultures ordered today -Plan for port removal today -Echo pending -Plan for TEE 2/19 -ID consult -Urology consult  AKI- likely due to above. Cr improved from 2.56 > 2.25 > 1.70 -Continue IV fluids -Holding home HCTZ -Avoid nephrotoxic agents -Monitor creatinine  History  of invasive bladder cancer- s/p radical cystoprostatectomy with ileal conduit diversion 1/24. Follows with Dr. Tresa Moore as an outpatient. -Urology consult -CT abd with bilateral hydronephrosis- per urology, this is expected after his surgery  Hypokalemia/hypomagnesemia -Replete and recheck  Hypertension- BPs normal today -Holding home HCTZ and norvasc -Continue home metoprolol  COPD- stable, no signs of acute exacerbation -Continue home inhalers  Normocytic anemia- likely anemia of chronic disease -Monitor  Hyperlipidemia-stable -Continue home Lipitor   All the records are reviewed and case discussed with Care Management/Social  Worker. Management plans discussed with the patient, family and they are in agreement.  CODE STATUS: Full Code  TOTAL TIME TAKING CARE OF THIS PATIENT: 45 minutes.   More than 50% of the time was spent in counseling/coordination of care: YES  POSSIBLE D/C IN 2-3 DAYS, DEPENDING ON CLINICAL CONDITION.   Berna Spare Mayo M.D on 09/04/2018 at 2:53 PM  Between 7am to 6pm - Pager - [phone]  After 6pm go to [email or web address] - [employer] [facility]  Office  [phone]  CC: Primary care physician; Kirk Ruths, MD  Note: This dictation was prepared with Dragon dictation along with smaller phrase technology. Any transcriptional errors that result from this process are unintentional.

## 2018-09-04 NOTE — Progress Notes (Signed)
SLP Cancellation Note  Patient Details Name: Steve Gilbert MRN: [id number] DOB: 1950/12/07   Cancelled treatment:       Reason Eval/Treat Not Completed: (chart reviewed; consulted NSG then met w/ pt/family). NSG and pt/family reported good oral intake this morning w/ liquids and some food; No overt s/s of aspiration noted during/post oral intake. Pt denied any concerns of swallowing. Briefly discussed general aspiration precautions including monitoring use of Large jug straw on tray table while lying in bed. Pt/family agreed.  No further skilled ST services indicated at this time as pt appears at his baseline re: swallowing. NSG to reconsult if any decline in status while admitted. Pt agreed.     Orinda Kenner, [place], CCC-SLP [name],[name] 09/04/2018, 12:43 PM

## 2018-09-04 NOTE — Op Note (Signed)
  OPERATIVE NOTE   PROCEDURE: Removal of Infuse-a-Port  PRE-OPERATIVE DIAGNOSIS: Sepsis  POST-OPERATIVE DIAGNOSIS: Same  SURGEON: Hortencia Pilar, M.D.  ANESTHESIA: Conscious sedation was administered under my direct supervision by the interventional radiology RN. IV Versed plus fentanyl were utilized. Continuous ECG, pulse oximetry and blood pressure was monitored throughout the entire procedure.  Conscious sedation was for a total of 21 minutes.   ESTIMATED BLOOD LOSS: Minimal   SPECIMEN(S):  Infuse-a-port intact  INDICATIONS:   Steve Gilbert is a 68 y.o. y.o. male who presents with sepsis.  Patient is therefore undergoing removal of the port. The risks and benefits of been reviewed all questions answered patient agrees to proceed with port removal   DESCRIPTION: After obtaining full informed written consent, the patient is brought to special procedures and positioned supine.  The patient received IV antibiotics.  The patient was prepped and draped in the standard fashion appropriate time out is called.    After infiltrating 1% lidocaine with epinephrine into the soft tissues and skin surrounding the port the previous incisional scar is reopened with an 11 blade scalpel.  The port is slipped from the pocket and subsequently removed without difficulty otherwise intact.  Pressure is held at the base of the neck for 5 minutes, a pocket is irrigated. Subtotally the wound is packed with saline moistened gauze and sterile dressings applied.  The patient tolerated the procedure without changes  COMPLICATIONS: None  CONDITION: Good  Hortencia Pilar, M.D. [facility] Office: [phone]   09/04/2018, 6:14 PM

## 2018-09-04 NOTE — Progress Notes (Signed)
Urology Consult Follow Up  Subjective: Urine output excellent.  Creatinine continues to improve.  Being transferred to floor.  Anti-infectives: Anti-infectives (From admission, onward)   Start     Dose/Rate Route Frequency Ordered Stop   09/04/18 1600  ceFAZolin (ANCEF) IVPB 2g/100 mL premix     2 g 200 mL/hr over 30 Minutes Intravenous Every 8 hours 09/04/18 1047     09/03/18 2000  levofloxacin (LEVAQUIN) IVPB 750 mg     750 mg 100 mL/hr over 90 Minutes Intravenous  Once 09/03/18 1904 09/04/18 0732   09/03/18 1015  ceFAZolin (ANCEF) IVPB 2g/100 mL premix  Status:  Discontinued     2 g 200 mL/hr over 30 Minutes Intravenous Every 12 hours 09/03/18 1009 09/04/18 1047   09/03/18 0800  piperacillin-tazobactam (ZOSYN) IVPB 3.375 g  Status:  Discontinued     3.375 g 100 mL/hr over 30 Minutes Intravenous Every 8 hours 09/02/18 2348 09/02/18 2352   09/03/18 0000  metroNIDAZOLE (FLAGYL) IVPB 500 mg  Status:  Discontinued     500 mg 100 mL/hr over 60 Minutes Intravenous  Once 09/02/18 2347 09/02/18 2352   09/03/18 0000  piperacillin-tazobactam (ZOSYN) IVPB 3.375 g  Status:  Discontinued     3.375 g 12.5 mL/hr over 240 Minutes Intravenous Every 8 hours 09/02/18 2352 09/03/18 1009   09/02/18 2145  vancomycin (VANCOCIN) 1,750 mg in sodium chloride 0.9 % 500 mL IVPB     1,750 mg 250 mL/hr over 120 Minutes Intravenous  Once 09/02/18 2143 09/03/18 0102   09/02/18 2115  ceFEPIme (MAXIPIME) 2 g in sodium chloride 0.9 % 100 mL IVPB     2 g 200 mL/hr over 30 Minutes Intravenous  Once 09/02/18 2111 09/02/18 2224   09/02/18 2115  metroNIDAZOLE (FLAGYL) IVPB 500 mg  Status:  Discontinued     500 mg 100 mL/hr over 60 Minutes Intravenous Every 8 hours 09/02/18 2111 09/02/18 2347   09/02/18 2115  vancomycin (VANCOCIN) IVPB 1000 mg/200 mL premix  Status:  Discontinued     1,000 mg 200 mL/hr over 60 Minutes Intravenous  Once 09/02/18 2111 09/02/18 2143      Current Facility-Administered Medications   Medication Dose Route Frequency Provider Last Rate Last Dose  . acetaminophen (TYLENOL) tablet 650 mg  650 mg Oral Q6H PRN Arta Silence, MD   650 mg at 09/03/18 1440   Or  . acetaminophen (TYLENOL) suppository 650 mg  650 mg Rectal Q6H PRN Arta Silence, MD   650 mg at 09/03/18 0503  . albuterol (PROVENTIL) (2.5 MG/3ML) 0.083% nebulizer solution 2.5 mg  2.5 mg Nebulization Q6H PRN Arta Silence, MD      . aspirin EC tablet 81 mg  81 mg Oral Daily Arta Silence, MD   81 mg at 09/04/18 0942  . atorvastatin (LIPITOR) tablet 20 mg  20 mg Oral Daily Arta Silence, MD   20 mg at 09/03/18 1718  . bisacodyl (DULCOLAX) EC tablet 5 mg  5 mg Oral Daily PRN Arta Silence, MD      . ceFAZolin (ANCEF) IVPB 2g/100 mL premix  2 g Intravenous Q8H Berton Mount, RPH      . heparin injection 5,000 Units  5,000 Units Subcutaneous Q8H Arta Silence, MD   5,000 Units at 09/04/18 0500  . lactated ringers infusion   Intravenous Continuous Wilhelmina Mcardle, MD 75 mL/hr at 09/04/18 1158    . metoprolol tartrate (LOPRESSOR) tablet 12.5 mg  12.5 mg Oral BID Arta Silence, MD  12.5 mg at 09/04/18 0942  . mometasone-formoterol (DULERA) 200-5 MCG/ACT inhaler 2 puff  2 puff Inhalation BID Arta Silence, MD   2 puff at 09/04/18 0943  . ondansetron (ZOFRAN) injection 4 mg  4 mg Intravenous Q6H PRN Arta Silence, MD      . pantoprazole (PROTONIX) EC tablet 40 mg  40 mg Oral QPM Arta Silence, MD   40 mg at 09/03/18 1718  . potassium chloride SA (K-DUR,KLOR-CON) CR tablet 40 mEq  40 mEq Oral BID Wilhelmina Mcardle, MD   40 mEq at 09/04/18 1051  . senna-docusate (Senokot-S) tablet 1 tablet  1 tablet Oral QHS PRN Arta Silence, MD      . sodium chloride flush (NS) 0.9 % injection 10-40 mL  10-40 mL Intracatheter Q12H Arta Silence, MD   30 mL at 09/04/18 1059  . sodium chloride flush (NS) 0.9 % injection 10-40 mL  10-40 mL Intracatheter PRN Arta Silence, MD         Objective: Vital signs in last 24 hours: Temp:  [98.6 F (37 C)-100.9 F (38.3 C)] 98.9 F (37.2 C) (02/18 1200) Pulse Rate:  [60-114] 94 (02/18 1200) Resp:  [19-35] 28 (02/18 1200) BP: (108-137)/(64-84) 117/82 (02/18 1200) SpO2:  [95 %-100 %] 100 % (02/18 1200)  Intake/Output from previous day: 02/17 0701 - 02/18 0700 In: 1758.4 [P.O.:720; I.V.:638.6; IV Piggyback:399.9] Out: 1975 [BSJGG:8366] Intake/Output this shift: Total I/O In: 30 [I.V.:30] Out: 750 [Urine:750]   Physical Exam  Alert and oriented x3.  No acute distress. Abdomen is mildly distended, soft.  Incisions healing well.  Ostomy pink with clear yellow urine.  Slight bruising/blanching around port sites but no concern for cellulitis. Minimal lower extremity edema  Lab Results:  Recent Labs    09/03/18 0423 09/04/18 0654  WBC 8.9 5.3  HGB 10.9* 9.7*  HCT 34.5* 29.8*  PLT 121* 102*   BMET Recent Labs    09/03/18 0423 09/03/18 1719 09/04/18 0654  NA 141  --  138  K 3.3* 3.0* 3.0*  CL 109  --  109  CO2 25  --  23  GLUCOSE 107*  --  104*  BUN 31*  --  29*  CREATININE 2.25*  --  1.70*  CALCIUM 7.4*  --  7.5*   PT/INR Recent Labs    09/02/18 2042  LABPROT 15.8*  INR 1.27   ABG Recent Labs    09/03/18 0138  PHART 7.44  HCO3 23.1     Assessment/plan:  68 year old male status post cystoprostatectomy last month with Dr. Tresa Moore admitted with sepsis of probable urinary source.  Cultures pending.  Stents inadvertently pulled 2 days ago hydronephrosis anticipated secondary to refluxing anastomoses.   Creatinine is trending down.  Culture growing staph aureus.  -Continue supportive care -Continue to trend creatinine -Follow-up urine culture and adjust based on sensitivity, would treat for 14-day course    LOS: 2 days    Hollice Espy 09/04/2018   Urology will sign off.  Please page with any questions or concerns.  He may follow-up with Dr. Cathie Olden scheduled.

## 2018-09-04 NOTE — Progress Notes (Signed)
*  PRELIMINARY RESULTS* Echocardiogram 2D Echocardiogram has been performed.  [name] 09/04/2018, 10:20 AM

## 2018-09-05 ENCOUNTER — Inpatient Hospital Stay: Payer: PPO | Admitting: Internal Medicine

## 2018-09-05 ENCOUNTER — Inpatient Hospital Stay (HOSPITAL_COMMUNITY)
Admit: 2018-09-05 | Discharge: 2018-09-05 | Disposition: A | Discharge status: Home / Self Care | Payer: PPO | Attending: Internal Medicine | Admitting: Internal Medicine

## 2018-09-05 ENCOUNTER — Encounter: Payer: Self-pay | Admitting: Vascular Surgery

## 2018-09-05 ENCOUNTER — Encounter
Admission: EM | Disposition: A | Discharge status: Home Health | Payer: Self-pay | Source: Home / Self Care | Attending: Internal Medicine

## 2018-09-05 ENCOUNTER — Inpatient Hospital Stay: Payer: PPO

## 2018-09-05 DIAGNOSIS — R7881 Bacteremia: Secondary | ICD-10-CM

## 2018-09-05 DIAGNOSIS — Z906 Acquired absence of other parts of urinary tract: Secondary | ICD-10-CM

## 2018-09-05 DIAGNOSIS — Z8551 Personal history of malignant neoplasm of bladder: Secondary | ICD-10-CM

## 2018-09-05 HISTORY — PX: TEE WITHOUT CARDIOVERSION: SHX5443

## 2018-09-05 LAB — CBC
HCT: 32.3 % — ABNORMAL LOW (ref 39.0–52.0)
HEMOGLOBIN: 10.3 g/dL — AB (ref 13.0–17.0)
MCH: 30.3 pg (ref 26.0–34.0)
MCHC: 31.9 g/dL (ref 30.0–36.0)
MCV: 95 fL (ref 80.0–100.0)
Platelets: 123 10*3/uL — ABNORMAL LOW (ref 150–400)
RBC: 3.4 MIL/uL — ABNORMAL LOW (ref 4.22–5.81)
RDW: 12.5 % (ref 11.5–15.5)
WBC: 4.2 10*3/uL (ref 4.0–10.5)
nRBC: 0 % (ref 0.0–0.2)

## 2018-09-05 LAB — PROCALCITONIN: Procalcitonin: 13.15 ng/mL

## 2018-09-05 LAB — URINE CULTURE

## 2018-09-05 LAB — BASIC METABOLIC PANEL
Anion gap: 5 (ref 5–15)
BUN: 27 mg/dL — ABNORMAL HIGH (ref 8–23)
CHLORIDE: 114 mmol/L — AB (ref 98–111)
CO2: 25 mmol/L (ref 22–32)
Calcium: 8.6 mg/dL — ABNORMAL LOW (ref 8.9–10.3)
Creatinine, Ser: 1.62 mg/dL — ABNORMAL HIGH (ref 0.61–1.24)
GFR calc Af Amer: 50 mL/min — ABNORMAL LOW (ref >60)
GFR calc non Af Amer: 43 mL/min — ABNORMAL LOW (ref >60)
Glucose, Bld: 107 mg/dL — ABNORMAL HIGH (ref 70–99)
Potassium: 3.9 mmol/L (ref 3.5–5.1)
Sodium: 144 mmol/L (ref 135–145)

## 2018-09-05 LAB — MAGNESIUM: Magnesium: 2.1 mg/dL (ref 1.7–2.4)

## 2018-09-05 LAB — CULTURE, BLOOD (ROUTINE X 2)

## 2018-09-05 SURGERY — ECHOCARDIOGRAM, TRANSESOPHAGEAL
Anesthesia: Moderate Sedation

## 2018-09-05 MED ORDER — FENTANYL CITRATE (PF) 100 MCG/2ML IJ SOLN
INTRAMUSCULAR | Status: AC
  Filled 2018-09-05: qty 2

## 2018-09-05 MED ORDER — FENTANYL CITRATE (PF) 100 MCG/2ML IJ SOLN
INTRAMUSCULAR | Status: AC | PRN
  Administered 2018-09-05 (×6): 25 ug via INTRAVENOUS

## 2018-09-05 MED ORDER — LIDOCAINE VISCOUS HCL 2 % MT SOLN
OROMUCOSAL | Status: AC
  Filled 2018-09-05: qty 15

## 2018-09-05 MED ORDER — SODIUM CHLORIDE FLUSH 0.9 % IV SOLN
INTRAVENOUS | Status: AC
  Filled 2018-09-05: qty 10

## 2018-09-05 MED ORDER — MIDAZOLAM HCL 5 MG/5ML IJ SOLN
INTRAMUSCULAR | Status: AC
  Filled 2018-09-05: qty 5

## 2018-09-05 MED ORDER — BUTAMBEN-TETRACAINE-BENZOCAINE 2-2-14 % EX AERO
INHALATION_SPRAY | CUTANEOUS | Status: AC
  Filled 2018-09-05: qty 5

## 2018-09-05 MED ORDER — MIDAZOLAM HCL 5 MG/5ML IJ SOLN
INTRAMUSCULAR | Status: AC | PRN
  Administered 2018-09-05 (×6): 1 mg via INTRAVENOUS

## 2018-09-05 MED ORDER — SODIUM CHLORIDE 0.9 % IV SOLN: INTRAVENOUS | Status: DC

## 2018-09-05 MED ORDER — BUTAMBEN-TETRACAINE-BENZOCAINE 2-2-14 % EX AERO
INHALATION_SPRAY | CUTANEOUS | Status: AC | PRN
  Administered 2018-09-05: 1 via TOPICAL

## 2018-09-05 MED ORDER — LIDOCAINE VISCOUS HCL 2 % MT SOLN
OROMUCOSAL | Status: AC | PRN
  Administered 2018-09-05: 15 mL via OROMUCOSAL

## 2018-09-05 NOTE — Progress Notes (Signed)
[facility] Infusion Coordinator will follow pt with ID and hospital teams to support home IV ABX at DC.    If patient discharges after hours, please call [phone].   Steve Gilbert 09/05/2018, 6:00 PM

## 2018-09-05 NOTE — Progress Notes (Signed)
[place] [facility] [place] NAME: Steve Gilbert    MR#:  [id number]  DATE OF BIRTH:  02-26-1951  SUBJECTIVE:   Doing well this morning.  States he is "sleepy".  He denies any chest pain, shortness of breath, abdominal pain.  He did not have a fever overnight.  REVIEW OF SYSTEMS:  Review of Systems  Constitutional: Positive for malaise/fatigue. Negative for chills and fever.  HENT: Negative for congestion and sore throat.   Eyes: Negative for blurred vision and double vision.  Respiratory: Negative for cough and shortness of breath.   Cardiovascular: Negative for chest pain and palpitations.  Gastrointestinal: Negative for abdominal pain, nausea and vomiting.  Genitourinary: Negative for flank pain and hematuria.  Musculoskeletal: Negative for back pain and neck pain.  Neurological: Positive for weakness. Negative for dizziness, focal weakness and headaches.  Psychiatric/Behavioral: Negative for depression and suicidal ideas.    DRUG ALLERGIES:   Allergies  Allergen Reactions  . Contrast Media [Iodinated Diagnostic Agents] Itching and Rash   VITALS:  Blood pressure (!) 144/98, pulse 96, temperature 99.5 F (37.5 C), temperature source Oral, resp. rate 18, height 5\' 7"  (1.702 m), weight 92.5 kg, SpO2 95 %. PHYSICAL EXAMINATION:  Physical Exam  General: Laying in bed in no acute distress HEENT: Normocephalic, atraumatic, EOMI, no scleral icterus, moist mucous membranes Cardiovascular: RRR, no murmurs, rubs, gallops Lungs: Lungs clear to auscultation bilaterally, no wheezes or crackles Abdomen: Soft, nontender, + mild distention. +Ostomy pink with yellow urine present in bag. Extremities: No edema, cyanosis, clubbing Neurologic: CN 2-12 grossly intact, no focal deficits, sensation intact throughout Skin: No rashes or lesions on exposed skin LABORATORY PANEL:  Male CBC Recent Labs  Lab 09/05/18 0430  WBC 4.2  HGB 10.3*  HCT 32.3*    PLT 123*   ------------------------------------------------------------------------------------------------------------------ Chemistries  Recent Labs  Lab 09/02/18 2042  09/05/18 0430  NA 139   < > 144  K 3.1*   < > 3.9  CL 100   < > 114*  CO2 27   < > 25  GLUCOSE 124*   < > 107*  BUN 35*   < > 27*  CREATININE 2.56*   < > 1.62*  CALCIUM 8.2*   < > 8.6*  MG  --    < > 2.1  AST 39  --   --   ALT 22  --   --   ALKPHOS 64  --   --   BILITOT 1.7*  --   --    < > = values in this interval not displayed.   RADIOLOGY:  No results found. ASSESSMENT AND PLAN:   Staph aureus UTI and bacteremia- meeting sepsis criteria on admission with fever, tachycardia, lactic acidosis. Sepsis improving.  Afebrile overnight. -Continue Ancef (staph is pansensitive) -Repeat blood cultures ordered 09/04/18- no growth to date -Port removed 09/04/18 -Echo without vegetations -Plan for TEE today -ID consult -Urology consult  AKI- likely due to above.  Creatinine continuing to improve. -Continue IV fluids -Holding home HCTZ -Avoid nephrotoxic agents -Monitor creatinine  History of invasive bladder cancer- s/p radical cystoprostatectomy with ileal conduit diversion 1/24. Follows with Dr. Tresa Moore as an outpatient. -Urology consult -CT abd with bilateral hydronephrosis- per urology, this is expected after his surgery  Hypertension- BPs normal today -Holding home HCTZ and norvasc, may be able to restart Norvasc tomorrow -Continue home metoprolol  COPD- stable, no signs of acute exacerbation -Continue home inhalers  Normocytic anemia- likely anemia of chronic disease -Monitor  Hyperlipidemia-stable -Continue home Lipitor   All the records are reviewed and case discussed with Care Management/Social Worker. Management plans discussed with the patient, family and they are in agreement.  CODE STATUS: Full Code  TOTAL TIME TAKING CARE OF THIS PATIENT: 40 minutes.   More than 50% of the time  was spent in counseling/coordination of care: YES  POSSIBLE D/C IN 1-2 DAYS, DEPENDING ON CLINICAL CONDITION.   Berna Spare  M.D on 09/05/2018 at 11:55 AM  Between 7am to 6pm - Pager - [phone]  After 6pm go to [email or web address] - Technical brewer [facility]  Office  [phone]  CC: Primary care physician; Kirk Ruths, MD  Note: This dictation was prepared with Dragon dictation along with smaller phrase technology. Any transcriptional errors that result from this process are unintentional.

## 2018-09-05 NOTE — Care Management Important Message (Signed)
Important Message  Patient Details  Name: Steve Gilbert MRN: [id number] Date of Birth: Dec 06, 1950   Medicare Important Message Given:  Yes    [name] 09/05/2018, 10:44 AM

## 2018-09-05 NOTE — Procedures (Signed)
Transesophageal Echocardiogram :  Indication: bacteremia, staph aureus,  Requesting/ordering  physician: Lavena Stanford  Procedure: Benzocaine spray x2 and 2 mls x 2 of viscous lidocaine were given orally to provide local anesthesia to the oropharynx. The patient was positioned supine on the left side, bite block provided. The patient was moderately sedated with the doses of versed and fentanyl as detailed below.  Using digital technique an omniplane probe was advanced into the distal esophagus without incident.   Moderate sedation: 1. Sedation used:  Versed: 6 mg, Fentanyl: 150 ug 2. Time administered:  1:30 PM   Time when patient started recovery: 2:30 pm 3. I was face to face during this time: [phone]  See report in EPIC  for complete details: In brief, transgastric imaging revealed normal LV function with no RWMAs and no mural apical thrombus.  .  Estimated ejection fraction was 55%.  Right sided cardiac chambers were normal with no evidence of pulmonary hypertension.  No valve vegetation  Imaging of the septum showed no ASD or VSD Bubble study was negative for shunt 2D and color flow confirmed no PFO  The LA was well visualized in orthogonal views.  There was no spontaneous contrast and no thrombus in the LA and LA appendage   The descending thoracic aorta had no  mural aortic debris with no evidence of aneurysmal dilation or disection   Steve Gilbert 09/05/2018 2:38 PM

## 2018-09-05 NOTE — Progress Notes (Signed)
    [facility] has been requested to perform a transesophageal echocardiogram on Steve Gilbert for transesophageal echocardiogram with Dr Rockey Situ this afternoon - 2.19.2020.  After careful review of history and examination, the risks and benefits of transesophageal echocardiogram have been explained including risks of esophageal damage, perforation (1:10,000 risk), bleeding, pharyngeal hematoma as well as other potential complications associated with conscious sedation including aspiration, arrhythmia, respiratory failure and death. Alternatives to treatment were discussed, questions were answered. Patient is willing to proceed.   Murray Hodgkins, NP  09/05/2018 8:45 AM

## 2018-09-05 NOTE — Plan of Care (Signed)

## 2018-09-05 NOTE — Progress Notes (Signed)
*  PRELIMINARY RESULTS* Echocardiogram Echocardiogram Transesophageal has been performed.  [name] 09/05/2018, 2:44 PM

## 2018-09-05 NOTE — Progress Notes (Signed)
LR infusion stopped waiting for delivery from pharmacy.

## 2018-09-05 NOTE — Progress Notes (Signed)
   Date of Admission:  09/02/2018   ID: Steve Gilbert is a 68 y.o. male Active Problems:   Severe sepsis ([place])   Altered mental status staph aureus bacteremia Port  Ileal conduit   Subjective: Doing much better Had TEE and was neg Port removed yesterday  Medications:  . aspirin EC  81 mg Oral Daily  . atorvastatin  20 mg Oral Daily  . butamben-tetracaine-benzocaine      . fentaNYL      . fentaNYL      . heparin injection (subcutaneous)  5,000 Units Subcutaneous Q8H  . lidocaine      . metoprolol tartrate  12.5 mg Oral BID  . midazolam      . midazolam      . mometasone-formoterol  2 puff Inhalation BID  . pantoprazole  40 mg Oral QPM  . sodium chloride flush  10-40 mL Intracatheter Q12H  . sodium chloride flush        Objective: Vital signs in last 24 hours: Temp:  [98.2 F (36.8 C)-99.5 F (37.5 C)] 98.2 F (36.8 C) (02/19 1533) Pulse Rate:  [92-111] 103 (02/19 1533) Resp:  [10-26] 19 (02/19 1533) BP: (117-158)/(73-98) 131/95 (02/19 1533) SpO2:  [92 %-100 %] 94 % (02/19 1533) Weight:  [92.5 kg] 92.5 kg (02/18 1635)  PHYSICAL EXAM:  General: Alert, cooperative, no distress, .  Head: Normocephalic, without obvious abnormality, atraumatic. Eyes: Conjunctivae clear, anicteric sclerae. Pupils are equal ENT Nares normal. No drainage or sinus tenderness. Lips, mucosa, and tongue normal. No Thrush Neck: Supple, symmetrical, no adenopathy, thyroid: non tender no carotid bruit and no JVD. Back: No CVA tenderness. Lungs: b/la ir entry Port removed- site glued  Heart: s1s2 Abdomen: Soft,ileal conduit with ostomy Extremities: atraumatic, no cyanosis. No edema. No clubbing Skin: No rashes or lesions. Or bruising Lymph: Cervical, supraclavicular normal. Neurologic: Grossly non-focal  Lab Results Recent Labs    09/04/18 0654 09/05/18 0430  WBC 5.3 4.2  HGB 9.7* 10.3*  HCT 29.8* 32.3*  NA 138 144  K 3.0* 3.9  CL 109 114*  CO2 23 25  BUN 29* 27*  CREATININE  1.70* 1.62*   Liver Panel Recent Labs    09/02/18 2042  PROT 6.6  ALBUMIN 3.5  AST 39  ALT 22  ALKPHOS 64  BILITOT 1.7*   Sedimentation Rate No results for input(s): ESRSEDRATE in the last 72 hours. C-Reactive Protein No results for input(s): CRP in the last 72 hours.  Microbiology:  Studies/Results: No results found.   Assessment/Plan:  68 y.o. male with a history of bladder CA status post robot-assisted laparoscopic radical cystoprostatectomy, bilateral pelvic lymphadenectomy, ileal conduit and right inguinal hernia repair on 08/10/2018 presents with weakness and altered mental status and found to have a temp of 105, Ct showed b/l Hydronephrosis with some perinephric stranding on the left side. Has a port and Blood culture is positive for staph aureus  MSSA bacteremia - port has been removed- no pneumonia Also has MSSA in the urine which could be secondary to bacteremia 'Currently on cefazolin  TEE no vegetation - repeat blood culture from 2/18 pending- if it remians negative tomorrow can place PICC. Will need Cefazolin until 10/01/18- will treat like an endovascular infection   Ca bladder s/p cystoprostatectomy and ileal conduit  AKI- improving  Discussed the management with patient and his wife

## 2018-09-05 NOTE — Progress Notes (Signed)
Pt exhibit losses stool whole whole pills excreted in the stool.

## 2018-09-06 ENCOUNTER — Encounter: Payer: Self-pay | Admitting: Cardiovascular Disease

## 2018-09-06 ENCOUNTER — Inpatient Hospital Stay: Payer: Self-pay

## 2018-09-06 LAB — BASIC METABOLIC PANEL
Anion gap: 6 (ref 5–15)
BUN: 25 mg/dL — ABNORMAL HIGH (ref 8–23)
CO2: 25 mmol/L (ref 22–32)
Calcium: 8.3 mg/dL — ABNORMAL LOW (ref 8.9–10.3)
Chloride: 109 mmol/L (ref 98–111)
Creatinine, Ser: 1.39 mg/dL — ABNORMAL HIGH (ref 0.61–1.24)
GFR calc Af Amer: >60 mL/min (ref >60)
GFR calc non Af Amer: 52 mL/min — ABNORMAL LOW (ref >60)
Glucose, Bld: 101 mg/dL — ABNORMAL HIGH (ref 70–99)
POTASSIUM: 3.5 mmol/L (ref 3.5–5.1)
Sodium: 140 mmol/L (ref 135–145)

## 2018-09-06 LAB — CBC
HCT: 31 % — ABNORMAL LOW (ref 39.0–52.0)
Hemoglobin: 9.8 g/dL — ABNORMAL LOW (ref 13.0–17.0)
MCH: 30.2 pg (ref 26.0–34.0)
MCHC: 31.6 g/dL (ref 30.0–36.0)
MCV: 95.7 fL (ref 80.0–100.0)
Platelets: 142 10*3/uL — ABNORMAL LOW (ref 150–400)
RBC: 3.24 MIL/uL — AB (ref 4.22–5.81)
RDW: 12.7 % (ref 11.5–15.5)
WBC: 3.5 10*3/uL — ABNORMAL LOW (ref 4.0–10.5)
nRBC: 0 % (ref 0.0–0.2)

## 2018-09-06 MED ORDER — SODIUM CHLORIDE 0.9% FLUSH: 10.0000 mL | Freq: Two times a day (BID) | INTRAVENOUS | Status: DC

## 2018-09-06 MED ORDER — SODIUM CHLORIDE 0.9% FLUSH: 10.0000 mL | INTRAVENOUS | Status: DC | PRN

## 2018-09-06 MED ORDER — CEFAZOLIN IV (FOR PTA / DISCHARGE USE ONLY): 2.0000 g | Freq: Three times a day (TID) | INTRAVENOUS | 0 refills | Status: AC

## 2018-09-06 NOTE — Progress Notes (Signed)
PHARMACY CONSULT NOTE FOR:  OUTPATIENT  PARENTERAL ANTIBIOTIC THERAPY (OPAT)  Indication: MSSA bacteremia Regimen: Cefazolin 2 gm IV q8h End date: 10/01/2018  IV antibiotic discharge orders are pended. To discharging provider:  please sign these orders via discharge navigator,  Select New Orders & click on the button choice - Manage This Unsigned Work.     Thank you for allowing pharmacy to be a part of this patient's care.  Madellyn Denio A 09/06/2018, 11:13 AM

## 2018-09-06 NOTE — Discharge Instructions (Signed)
It was so nice to meet you during this hospitalization!  You came into the hospital with confusion. We found that you had bacteria in your blood and urine. We removed your port and your transesophageal echo (TEE) did not show any bacteria on your heart. The infectious disease doctor is recommending that you take Ancef (antibiotic) until 10/01/18.  Take care, Dr. Brett Albino

## 2018-09-06 NOTE — Care Management (Signed)
Will be followed by [facility] for IV Antibiotics. Family/friend will transport Shelbie Ammons RN MSN [facility] [phone]

## 2018-09-06 NOTE — Progress Notes (Signed)
Patient discharged home with family. All questions answered. Madlyn Frankel, RN

## 2018-09-06 NOTE — Discharge Summary (Signed)
[facility] [place] NAME: Steve Gilbert    MR#:  [id number]  DATE OF BIRTH:  Sep 20, 1950  DATE OF ADMISSION:  09/02/2018   ADMITTING PHYSICIAN: Arta Silence, MD  DATE OF DISCHARGE: 09/06/18  PRIMARY CARE PHYSICIAN: Kirk Ruths, MD   ADMISSION DIAGNOSIS:  Fever [R50.9] Altered mental status, unspecified altered mental status type [R41.82] Sepsis, due to unspecified organism, unspecified whether acute organ dysfunction present ([place]) [A41.9] DISCHARGE DIAGNOSIS:  Active Problems:   Severe sepsis ([place])   Altered mental status  SECONDARY DIAGNOSIS:   Past Medical History:  Diagnosis Date  . Acid reflux   . Allergy   . Cancer ([place])   . COPD (chronic obstructive pulmonary disease) ([place])   . High cholesterol   . Hyperlipidemia 04/03/2018  . Hypertension   . Pneumonia    HOSPITAL COURSE:   Steve Gilbert is a 68 year old male who presented to the ED with fevers and AMS. He was meeting criteria on admission. He had recently had a radical cystoprostatectomy with ileal conduit diversion 1/24. UA was consistent with infection. He was admitted for further management.  Staph aureus UTI and bacteremia- meeting sepsis criteria on admission with fever, tachycardia, lactic acidosis. Sepsis resolved.  -Repeat blood cultures ordered 09/04/18- no growth to date -Port removed 09/04/18 -Echo and TEE without vegetations -ID recommended ancef for a total 4 week course -PICC line placed and HHRN ordered on discharge -Patient needs to f/u with ID in 2 weeks  AKI- greatly improved with fluids -HCTZ initially held, but was restarted on discharge  History of invasive bladder cancer- s/p radical cystoprostatectomy with ileal conduit diversion 1/24. Follows with Dr. Tresa Moore as an outpatient. -CT abd with bilateral hydronephrosis- per urology, this is expected after his surgery -Needs to f/u with urology as an outpatient  Hypertension -Home BP  meds continued on discharge  COPD- stable, no signs of acute exacerbation -Continued home inhalers  Normocytic anemia- likely anemia of chronic disease -Monitor  Hyperlipidemia-stable -Continued home Lipitor  DISCHARGE CONDITIONS:  Staph aureus bacteremia AKI History of invasive bladder cancer Hypertension COPD Normocytic anemia Hyperlipidemia CONSULTS OBTAINED:  Treatment Team:  Festus Aloe, MD Arta Silence, MD DRUG ALLERGIES:   Allergies  Allergen Reactions  . Contrast Media [Iodinated Diagnostic Agents] Itching and Rash   DISCHARGE MEDICATIONS:   Allergies as of 09/06/2018      Reactions   Contrast Media [iodinated Diagnostic Agents] Itching, Rash      Medication List    STOP taking these medications   HYDROcodone-acetaminophen 5-325 MG tablet Commonly known as:  NORCO   lidocaine-prilocaine cream Commonly known as:  EMLA   ondansetron 8 MG tablet Commonly known as:  ZOFRAN   prochlorperazine 10 MG tablet Commonly known as:  COMPAZINE     TAKE these medications   albuterol 108 (90 Base) MCG/ACT inhaler Commonly known as:  PROVENTIL HFA;VENTOLIN HFA Inhale 2 puffs into the lungs every 6 (six) hours as needed for wheezing or shortness of breath.   amLODipine 10 MG tablet Commonly known as:  NORVASC Take 10 mg by mouth daily.   aspirin EC 81 MG tablet Take 81 mg by mouth daily.   atorvastatin 20 MG tablet Commonly known as:  LIPITOR Take 20 mg by mouth daily.   azelastine 0.1 % nasal spray Commonly known as:  ASTELIN Place 1 spray into both nostrils 2 (two) times daily as needed (congestion). Use in each nostril as directed  ceFAZolin  IVPB Commonly known as:  ANCEF Inject 2 g into the vein every 8 (eight) hours for 25 days. Indication:  MSSA bacteremia Last Day of Therapy:  10/01/2018 Labs weekly while on IV antibiotics: _X_ CBC with differential  _X_ CMP  _X_ Please pull PIC at completion of IV antibiotics _ Fax  weekly labs to [phone]   diphenhydrAMINE 25 MG tablet Commonly known as:  BENADRYL Take 25 mg by mouth daily as needed for allergies.   docusate sodium 100 MG capsule Commonly known as:  COLACE TAKE 1 CAPSULE BY MOUTH TWICE A DAY   Fluticasone-Salmeterol 250-50 MCG/DOSE Aepb Commonly known as:  ADVAIR Inhale 1 puff into the lungs 2 (two) times daily as needed (shortness of breath).   hydrochlorothiazide 12.5 MG tablet Commonly known as:  HYDRODIURIL Take 12.5 mg by mouth daily.   metoprolol tartrate 25 MG tablet Commonly known as:  LOPRESSOR TAKE 1 TABLET(25 MG) BY MOUTH TWICE DAILY   multivitamin with minerals tablet Take 1 tablet by mouth daily.   pantoprazole 40 MG tablet Commonly known as:  PROTONIX Take 40 mg by mouth every evening.   tiotropium 18 MCG inhalation capsule Commonly known as:  SPIRIVA Place 18 mcg into inhaler and inhale daily as needed (congestion).            Home Infusion Instuctions  (From admission, onward)         Start     Ordered   09/06/18 0000  Home infusion instructions [facility] May follow [facility] Dosing Protocol; May administer Cathflo as needed to maintain patency of vascular access device.; Flushing of vascular access device: per [facility] Protocol: 0.9% NaCl pre/post medica...    Question Answer Comment  Instructions May follow [facility] Dosing Protocol   Instructions May administer Cathflo as needed to maintain patency of vascular access device.   Instructions Flushing of vascular access device: per [facility] Protocol: 0.9% NaCl pre/post medication administration and prn patency; Heparin 100 u/ml, 3ml for implanted ports and Heparin 10u/ml, 46ml for all other central venous catheters.   Instructions May follow [facility] Anaphylaxis Protocol for First Dose Administration in the home: 0.9% NaCl at 25-50 ml/hr to maintain IV access for protocol meds. Epinephrine 0.3 ml IV/IM PRN and Benadryl 25-50 IV/IM PRN s/s of anaphylaxis.     Instructions [facility] Infusion Coordinator (RN) to assist per patient IV care needs in the home PRN.      09/06/18 1210           DISCHARGE INSTRUCTIONS:  1.  Follow-up with PCP in 5 days 2.  Follow-up with ID in 2 weeks 3.  Follow-up with urology in 1 to 2 weeks 4.  Take Ancef every 8 hours via PICC until 10/01/2018 DIET:  Cardiac diet DISCHARGE CONDITION:  Stable ACTIVITY:  Activity as tolerated OXYGEN:  Home Oxygen: No.  Oxygen Delivery: room air DISCHARGE LOCATION:  home   If you experience worsening of your admission symptoms, develop shortness of breath, life threatening emergency, suicidal or homicidal thoughts you must seek medical attention immediately by calling 911 or calling your MD immediately  if symptoms less severe.  You Must read complete instructions/literature along with all the possible adverse reactions/side effects for all the Medicines you take and that have been prescribed to you. Take any new Medicines after you have completely understood and accpet all the possible adverse reactions/side effects.   Please note  You were cared for by a hospitalist during  your hospital stay. If you have any questions about your discharge medications or the care you received while you were in the hospital after you are discharged, you can call the unit and asked to speak with the hospitalist on call if the hospitalist that took care of you is not available. Once you are discharged, your primary care physician will handle any further medical issues. Please note that NO REFILLS for any discharge medications will be authorized once you are discharged, as it is imperative that you return to your primary care physician (or establish a relationship with a primary care physician if you do not have one) for your aftercare needs so that they can reassess your need for medications and monitor your lab values.    On the day of Discharge:  VITAL SIGNS:  Blood pressure (!)  142/82, pulse 89, temperature 98.2 F (36.8 C), temperature source Oral, resp. rate 19, height 5\' 7"  (1.702 m), weight 92.5 kg, SpO2 96 %. PHYSICAL EXAMINATION:  General: Laying in bed in no acute distress HEENT: Normocephalic, atraumatic, EOMI, no scleral icterus, moist mucous membranes Cardiovascular: RRR, no murmurs, rubs, gallops Lungs: Lungs clear to auscultation bilaterally, no wheezes or crackles Abdomen: Soft, nontender, nondistended. +Ostomy pink with yellow urine present in bag. Extremities: No edema, cyanosis, clubbing Neurologic: CN 2-12 grossly intact, no focal deficits, sensation intact throughout Skin: No rashes or lesions on exposed skin DATA REVIEW:   CBC Recent Labs  Lab 09/06/18 0501  WBC 3.5*  HGB 9.8*  HCT 31.0*  PLT 142*    Chemistries  Recent Labs  Lab 09/02/18 2042  09/05/18 0430 09/06/18 0501  NA 139   < > 144 140  K 3.1*   < > 3.9 3.5  CL 100   < > 114* 109  CO2 27   < > 25 25  GLUCOSE 124*   < > 107* 101*  BUN 35*   < > 27* 25*  CREATININE 2.56*   < > 1.62* 1.39*  CALCIUM 8.2*   < > 8.6* 8.3*  MG  --    < > 2.1  --   AST 39  --   --   --   ALT 22  --   --   --   ALKPHOS 64  --   --   --   BILITOT 1.7*  --   --   --    < > = values in this interval not displayed.     Microbiology Results  Results for orders placed or performed during the hospital encounter of 09/02/18  Culture, blood (Routine x 2)     Status: Abnormal   Collection Time: 09/02/18  8:43 PM  Result Value Ref Range Status   Specimen Description   Final    BLOOD RIGHT FOREARM Performed at [facility], [address], [place], [place] [address]    Special Requests   Final    BOTTLES DRAWN AEROBIC AND ANAEROBIC Blood Culture results may not be optimal due to an excessive volume of blood received in culture bottles Performed at [facility], [address] [place], [place] [address]    Culture  Setup Time   Final    GRAM POSITIVE COCCI IN BOTH  AEROBIC AND ANAEROBIC BOTTLES CRITICAL VALUE NOTED.  VALUE IS CONSISTENT WITH PREVIOUSLY REPORTED AND CALLED VALUE. Performed at [facility], [address], [place], [place] [address]    Culture (A)  Final    STAPHYLOCOCCUS AUREUS  SUSCEPTIBILITIES PERFORMED ON PREVIOUS CULTURE WITHIN THE LAST 5 DAYS. Performed at [facility], [place] [address] [place], [place] [address]    Report Status 09/05/2018 FINAL  Final  Culture, blood (Routine x 2)     Status: Abnormal   Collection Time: 09/02/18  8:43 PM  Result Value Ref Range Status   Specimen Description   Final    BLOOD LEFT FOREARM Performed at [facility], [address] [place], [place] [address]    Special Requests   Final    BOTTLES DRAWN AEROBIC AND ANAEROBIC Blood Culture results may not be optimal due to an excessive volume of blood received in culture bottles Performed at [facility], [address] [place], [place] [address]    Culture  Setup Time   Final    GRAM POSITIVE COCCI IN BOTH AEROBIC AND ANAEROBIC BOTTLES CRITICAL RESULT CALLED TO, READ BACK BY AND VERIFIED WITH: CHRISTINE KATSOUDAS AT 0814 09/03/18 SDR    Culture STAPHYLOCOCCUS AUREUS (A)  Final   Report Status 09/05/2018 FINAL  Final   Organism ID, Bacteria STAPHYLOCOCCUS AUREUS  Final      Susceptibility   Staphylococcus aureus - MIC*    CIPROFLOXACIN <=0.5 SENSITIVE Sensitive     ERYTHROMYCIN <=0.25 SENSITIVE Sensitive     GENTAMICIN <=0.5 SENSITIVE Sensitive     OXACILLIN <=0.25 SENSITIVE Sensitive     TETRACYCLINE <=1 SENSITIVE Sensitive     VANCOMYCIN <=0.5 SENSITIVE Sensitive     TRIMETH/SULFA <=10 SENSITIVE Sensitive     CLINDAMYCIN <=0.25 SENSITIVE Sensitive     RIFAMPIN <=0.5 SENSITIVE Sensitive     Inducible Clindamycin NEGATIVE Sensitive     * STAPHYLOCOCCUS AUREUS  Blood Culture ID Panel (Reflexed)     Status: Abnormal   Collection Time: 09/02/18  8:43 PM  Result Value Ref Range Status   Enterococcus  species NOT DETECTED NOT DETECTED Final   Listeria monocytogenes NOT DETECTED NOT DETECTED Final   Staphylococcus species DETECTED (A) NOT DETECTED Final    Comment: CRITICAL RESULT CALLED TO, READ BACK BY AND VERIFIED WITH:  CHRISTINE KATSOUDAS AT 4818 09/03/18 SDR    Staphylococcus aureus (BCID) DETECTED (A) NOT DETECTED Final    Comment: Methicillin (oxacillin) susceptible Staphylococcus aureus (MSSA). Preferred therapy is anti staphylococcal beta lactam antibiotic (Cefazolin or Nafcillin), unless clinically contraindicated. CRITICAL RESULT CALLED TO, READ BACK BY AND VERIFIED WITH:  CHRISTINE KATSOUDAS AT 5631 09/03/18 SDR    Methicillin resistance NOT DETECTED NOT DETECTED Final   Streptococcus species NOT DETECTED NOT DETECTED Final   Streptococcus agalactiae NOT DETECTED NOT DETECTED Final   Streptococcus pneumoniae NOT DETECTED NOT DETECTED Final   Streptococcus pyogenes NOT DETECTED NOT DETECTED Final   Acinetobacter baumannii NOT DETECTED NOT DETECTED Final   Enterobacteriaceae species NOT DETECTED NOT DETECTED Final   Enterobacter cloacae complex NOT DETECTED NOT DETECTED Final   Escherichia coli NOT DETECTED NOT DETECTED Final   Klebsiella oxytoca NOT DETECTED NOT DETECTED Final   Klebsiella pneumoniae NOT DETECTED NOT DETECTED Final   Proteus species NOT DETECTED NOT DETECTED Final   Serratia marcescens NOT DETECTED NOT DETECTED Final   Haemophilus influenzae NOT DETECTED NOT DETECTED Final   Neisseria meningitidis NOT DETECTED NOT DETECTED Final   Pseudomonas aeruginosa NOT DETECTED NOT DETECTED Final   Candida albicans NOT DETECTED NOT DETECTED Final   Candida glabrata NOT DETECTED NOT DETECTED Final   Candida krusei NOT DETECTED NOT DETECTED Final   Candida parapsilosis NOT DETECTED NOT DETECTED Final  Candida tropicalis NOT DETECTED NOT DETECTED Final    Comment: Performed at [facility], [address] [place], [place] [address]  MRSA PCR Screening      Status: None   Collection Time: 09/03/18  2:15 AM  Result Value Ref Range Status   MRSA by PCR NEGATIVE NEGATIVE Final    Comment:        The GeneXpert MRSA Assay (FDA approved for NASAL specimens only), is one component of a comprehensive MRSA colonization surveillance program. It is not intended to diagnose MRSA infection nor to guide or monitor treatment for MRSA infections. Performed at [facility], [address], [place], [place] [address]   Urine Culture     Status: Abnormal   Collection Time: 09/03/18  3:00 PM  Result Value Ref Range Status   Specimen Description   Final    URINE, RANDOM Performed at [facility], [address] [place], [place] [address]    Special Requests   Final    NONE Performed at [facility], [place], [place] [address]    Culture >=100,000 COLONIES/mL STAPHYLOCOCCUS AUREUS (A)  Final   Report Status 09/05/2018 FINAL  Final   Organism ID, Bacteria STAPHYLOCOCCUS AUREUS (A)  Final      Susceptibility   Staphylococcus aureus - MIC*    CIPROFLOXACIN <=0.5 SENSITIVE Sensitive     GENTAMICIN <=0.5 SENSITIVE Sensitive     NITROFURANTOIN 32 SENSITIVE Sensitive     OXACILLIN <=0.25 SENSITIVE Sensitive     TETRACYCLINE <=1 SENSITIVE Sensitive     VANCOMYCIN <=0.5 SENSITIVE Sensitive     TRIMETH/SULFA <=10 SENSITIVE Sensitive     CLINDAMYCIN <=0.25 SENSITIVE Sensitive     RIFAMPIN <=0.5 SENSITIVE Sensitive     Inducible Clindamycin NEGATIVE Sensitive     * >=100,000 COLONIES/mL STAPHYLOCOCCUS AUREUS  C difficile quick scan w PCR reflex     Status: None   Collection Time: 09/04/18  1:49 AM  Result Value Ref Range Status   C Diff antigen NEGATIVE NEGATIVE Final   C Diff toxin NEGATIVE NEGATIVE Final   C Diff interpretation No C. difficile detected.  Final    Comment: Performed at [facility], [address] [place], [place] [address]  CULTURE, BLOOD (ROUTINE X 2) w Reflex to ID Panel      Status: None (Preliminary result)   Collection Time: 09/04/18  2:08 PM  Result Value Ref Range Status   Specimen Description BLOOD BLOOD LEFT ARM  Final   Special Requests   Final    BOTTLES DRAWN AEROBIC AND ANAEROBIC Blood Culture results may not be optimal due to an excessive volume of blood received in culture bottles   Culture   Final    NO GROWTH 2 DAYS Performed at [facility], [address], [place], [place] [address]    Report Status PENDING  Incomplete  CULTURE, BLOOD (ROUTINE X 2) w Reflex to ID Panel     Status: None (Preliminary result)   Collection Time: 09/04/18  2:14 PM  Result Value Ref Range Status   Specimen Description BLOOD BLOOD LEFT HAND  Final   Special Requests   Final    BOTTLES DRAWN AEROBIC AND ANAEROBIC Blood Culture results may not be optimal due to an excessive volume of blood received in culture bottles   Culture   Final    NO GROWTH 2 DAYS Performed at [facility], [address], [place], [place] [address]  Report Status PENDING  Incomplete    RADIOLOGY:  [name] Ekg Site Rite  Result Date: 09/06/2018 If [facility] image not attached, placement could not be confirmed due to current cardiac rhythm.    Management plans discussed with the patient, family and they are in agreement.  CODE STATUS: Full Code   TOTAL TIME TAKING CARE OF THIS PATIENT: 45 minutes.    Berna Spare Dickie Cloe M.D on 09/06/2018 at 1:55 PM  Between 7am to 6pm - Pager [phone]  After 6pm go to [email or web address] - [employer] [facility]  Office  [phone]  CC: Primary care physician; Kirk Ruths, MD   Note: This dictation was prepared with Dragon dictation along with smaller phrase technology. Any transcriptional errors that result from this process are unintentional.

## 2018-09-06 NOTE — Treatment Plan (Signed)
Diagnosis: MSSA bacteremia Baseline Creatinine 1.40  Culture Result: MSSA  Allergies  Allergen Reactions  . Contrast Media [Iodinated Diagnostic Agents] Itching and Rash    OPAT Orders Cefazolin 2 grams IV every 8 hours   Until 10/01/18 Duration :4 weeks  [facility] Care Per Protocol including placement of biopatch:  Labs weekly while on IV antibiotics: _X_ CBC with differential  _X_ CMP   _X_ Please pull PIC at completion of IV antibiotics _ Fax weekly labs to [phone]  Clinic Follow Up Appt:on 09/25/18 at [place]  Call [phone] to change appt

## 2018-09-06 NOTE — Progress Notes (Signed)
Peripherally Inserted Central Catheter/Midline Placement  The IV Nurse has discussed with the patient and/or persons authorized to consent for the patient, the purpose of this procedure and the potential benefits and risks involved with this procedure.  The benefits include less needle sticks, lab draws from the catheter, and the patient may be discharged home with the catheter. Risks include, but not limited to, infection, bleeding, blood clot (thrombus formation), and puncture of an artery; nerve damage and irregular heartbeat and possibility to perform a PICC exchange if needed/ordered by physician.  Alternatives to this procedure were also discussed.  Bard Power PICC patient education guide, fact sheet on infection prevention and patient information card has been provided to patient /or left at bedside.    PICC/Midline Placement Documentation  PICC Single Lumen 09/06/18 PICC Left Brachial 45 cm 0 cm (Active)  Indication for Insertion or Continuance of Line Home intravenous therapies (PICC only) 09/06/2018 11:23 AM  Exposed Catheter (cm) 0 cm 09/06/2018 11:23 AM  Site Assessment Clean;Dry;Intact 09/06/2018 11:23 AM  Line Status Flushed;Saline locked;Blood return noted 09/06/2018 11:23 AM  Dressing Type Transparent 09/06/2018 11:23 AM  Dressing Status Clean;Dry;Intact;Antimicrobial disc in place 09/06/2018 11:23 AM  Dressing Change Due 09/13/18 09/06/2018 11:23 AM       [facility] Payment 09/06/2018, 11:24 AM

## 2018-09-07 DIAGNOSIS — J449 Chronic obstructive pulmonary disease, unspecified: Secondary | ICD-10-CM | POA: Diagnosis not present

## 2018-09-07 DIAGNOSIS — Z9181 History of falling: Secondary | ICD-10-CM | POA: Diagnosis not present

## 2018-09-07 DIAGNOSIS — Z87891 Personal history of nicotine dependence: Secondary | ICD-10-CM | POA: Diagnosis not present

## 2018-09-07 DIAGNOSIS — I10 Essential (primary) hypertension: Secondary | ICD-10-CM | POA: Diagnosis not present

## 2018-09-07 DIAGNOSIS — E785 Hyperlipidemia, unspecified: Secondary | ICD-10-CM | POA: Diagnosis not present

## 2018-09-07 DIAGNOSIS — Z8701 Personal history of pneumonia (recurrent): Secondary | ICD-10-CM | POA: Diagnosis not present

## 2018-09-07 DIAGNOSIS — C679 Malignant neoplasm of bladder, unspecified: Secondary | ICD-10-CM | POA: Diagnosis not present

## 2018-09-07 DIAGNOSIS — A4101 Sepsis due to Methicillin susceptible Staphylococcus aureus: Secondary | ICD-10-CM | POA: Diagnosis not present

## 2018-09-07 DIAGNOSIS — Z452 Encounter for adjustment and management of vascular access device: Secondary | ICD-10-CM | POA: Diagnosis not present

## 2018-09-07 DIAGNOSIS — Z792 Long term (current) use of antibiotics: Secondary | ICD-10-CM | POA: Diagnosis not present

## 2018-09-07 DIAGNOSIS — K219 Gastro-esophageal reflux disease without esophagitis: Secondary | ICD-10-CM | POA: Diagnosis not present

## 2018-09-07 DIAGNOSIS — Z436 Encounter for attention to other artificial openings of urinary tract: Secondary | ICD-10-CM | POA: Diagnosis not present

## 2018-09-07 DIAGNOSIS — Z483 Aftercare following surgery for neoplasm: Secondary | ICD-10-CM | POA: Diagnosis not present

## 2018-09-07 DIAGNOSIS — D630 Anemia in neoplastic disease: Secondary | ICD-10-CM | POA: Diagnosis not present

## 2018-09-07 DIAGNOSIS — N136 Pyonephrosis: Secondary | ICD-10-CM | POA: Diagnosis not present

## 2018-09-07 DIAGNOSIS — Z48815 Encounter for surgical aftercare following surgery on the digestive system: Secondary | ICD-10-CM | POA: Diagnosis not present

## 2018-09-07 DIAGNOSIS — Z7982 Long term (current) use of aspirin: Secondary | ICD-10-CM | POA: Diagnosis not present

## 2018-09-09 LAB — CULTURE, BLOOD (ROUTINE X 2)
Culture: NO GROWTH
Culture: NO GROWTH

## 2018-09-11 ENCOUNTER — Encounter: Payer: Self-pay | Admitting: Infectious Diseases

## 2018-09-11 DIAGNOSIS — I10 Essential (primary) hypertension: Secondary | ICD-10-CM | POA: Diagnosis not present

## 2018-09-11 DIAGNOSIS — K219 Gastro-esophageal reflux disease without esophagitis: Secondary | ICD-10-CM | POA: Diagnosis not present

## 2018-09-11 DIAGNOSIS — R7881 Bacteremia: Secondary | ICD-10-CM | POA: Diagnosis not present

## 2018-09-11 DIAGNOSIS — J449 Chronic obstructive pulmonary disease, unspecified: Secondary | ICD-10-CM | POA: Diagnosis not present

## 2018-09-11 DIAGNOSIS — B9561 Methicillin susceptible Staphylococcus aureus infection as the cause of diseases classified elsewhere: Secondary | ICD-10-CM | POA: Diagnosis not present

## 2018-09-11 DIAGNOSIS — E7800 Pure hypercholesterolemia, unspecified: Secondary | ICD-10-CM | POA: Diagnosis not present

## 2018-09-13 ENCOUNTER — Other Ambulatory Visit: Payer: Self-pay | Admitting: Licensed Clinical Social Worker

## 2018-09-13 ENCOUNTER — Telehealth: Payer: Self-pay | Admitting: Licensed Clinical Social Worker

## 2018-09-13 DIAGNOSIS — E876 Hypokalemia: Secondary | ICD-10-CM

## 2018-09-13 MED ORDER — POTASSIUM CHLORIDE CRYS ER 20 MEQ PO TBCR: 20.0000 meq | EXTENDED_RELEASE_TABLET | Freq: Every day | ORAL | 0 refills | Status: DC

## 2018-09-13 NOTE — Telephone Encounter (Signed)
Can you send a prescription for 86meq KCL for 7-days. He needs an appt to see me next week. thx

## 2018-09-13 NOTE — Telephone Encounter (Signed)
[facility] shows low potassium at 3.2. I called the patient and he states it has been low and he has never been on potassium supplements. He states he thinks it is from having diarrhea since being on the antibiotics. He has watery and loose stools 3 times daily. He has been increasing fiber with no help.

## 2018-09-13 NOTE — Telephone Encounter (Signed)
Called patient to inform him that I called in potassium 20 meg for 7 days to his pharmacy. Patient states he will pick them up today. He is also scheduled to come in on 09/20/2018

## 2018-09-18 ENCOUNTER — Encounter: Payer: Self-pay | Admitting: Infectious Diseases

## 2018-09-18 DIAGNOSIS — J449 Chronic obstructive pulmonary disease, unspecified: Secondary | ICD-10-CM | POA: Diagnosis not present

## 2018-09-18 DIAGNOSIS — Z9181 History of falling: Secondary | ICD-10-CM | POA: Diagnosis not present

## 2018-09-18 DIAGNOSIS — Z452 Encounter for adjustment and management of vascular access device: Secondary | ICD-10-CM | POA: Diagnosis not present

## 2018-09-18 DIAGNOSIS — Z8701 Personal history of pneumonia (recurrent): Secondary | ICD-10-CM | POA: Diagnosis not present

## 2018-09-18 DIAGNOSIS — B9561 Methicillin susceptible Staphylococcus aureus infection as the cause of diseases classified elsewhere: Secondary | ICD-10-CM | POA: Diagnosis not present

## 2018-09-18 DIAGNOSIS — D630 Anemia in neoplastic disease: Secondary | ICD-10-CM | POA: Diagnosis not present

## 2018-09-18 DIAGNOSIS — Z48815 Encounter for surgical aftercare following surgery on the digestive system: Secondary | ICD-10-CM | POA: Diagnosis not present

## 2018-09-18 DIAGNOSIS — Z7982 Long term (current) use of aspirin: Secondary | ICD-10-CM | POA: Diagnosis not present

## 2018-09-18 DIAGNOSIS — Z436 Encounter for attention to other artificial openings of urinary tract: Secondary | ICD-10-CM | POA: Diagnosis not present

## 2018-09-18 DIAGNOSIS — C679 Malignant neoplasm of bladder, unspecified: Secondary | ICD-10-CM | POA: Diagnosis not present

## 2018-09-18 DIAGNOSIS — I10 Essential (primary) hypertension: Secondary | ICD-10-CM | POA: Diagnosis not present

## 2018-09-18 DIAGNOSIS — E785 Hyperlipidemia, unspecified: Secondary | ICD-10-CM | POA: Diagnosis not present

## 2018-09-18 DIAGNOSIS — Z792 Long term (current) use of antibiotics: Secondary | ICD-10-CM | POA: Diagnosis not present

## 2018-09-18 DIAGNOSIS — Z483 Aftercare following surgery for neoplasm: Secondary | ICD-10-CM | POA: Diagnosis not present

## 2018-09-18 DIAGNOSIS — K219 Gastro-esophageal reflux disease without esophagitis: Secondary | ICD-10-CM | POA: Diagnosis not present

## 2018-09-18 DIAGNOSIS — N136 Pyonephrosis: Secondary | ICD-10-CM | POA: Diagnosis not present

## 2018-09-18 DIAGNOSIS — Z87891 Personal history of nicotine dependence: Secondary | ICD-10-CM | POA: Diagnosis not present

## 2018-09-18 DIAGNOSIS — A4101 Sepsis due to Methicillin susceptible Staphylococcus aureus: Secondary | ICD-10-CM | POA: Diagnosis not present

## 2018-09-20 ENCOUNTER — Ambulatory Visit: Payer: PPO | Attending: Infectious Diseases | Admitting: Infectious Diseases

## 2018-09-20 ENCOUNTER — Encounter: Payer: Self-pay | Admitting: Infectious Diseases

## 2018-09-20 DIAGNOSIS — Z95828 Presence of other vascular implants and grafts: Secondary | ICD-10-CM | POA: Diagnosis not present

## 2018-09-20 DIAGNOSIS — Z932 Ileostomy status: Secondary | ICD-10-CM

## 2018-09-20 DIAGNOSIS — R7881 Bacteremia: Secondary | ICD-10-CM | POA: Diagnosis not present

## 2018-09-20 DIAGNOSIS — Z91041 Radiographic dye allergy status: Secondary | ICD-10-CM

## 2018-09-20 DIAGNOSIS — R197 Diarrhea, unspecified: Secondary | ICD-10-CM

## 2018-09-20 DIAGNOSIS — Z9079 Acquired absence of other genital organ(s): Secondary | ICD-10-CM

## 2018-09-20 DIAGNOSIS — E876 Hypokalemia: Secondary | ICD-10-CM | POA: Diagnosis not present

## 2018-09-20 DIAGNOSIS — Z906 Acquired absence of other parts of urinary tract: Secondary | ICD-10-CM

## 2018-09-20 DIAGNOSIS — Z8551 Personal history of malignant neoplasm of bladder: Secondary | ICD-10-CM

## 2018-09-20 DIAGNOSIS — B9561 Methicillin susceptible Staphylococcus aureus infection as the cause of diseases classified elsewhere: Secondary | ICD-10-CM

## 2018-09-20 MED ORDER — POTASSIUM CHLORIDE CRYS ER 20 MEQ PO TBCR: 20.0000 meq | EXTENDED_RELEASE_TABLET | Freq: Every day | ORAL | 0 refills | Status: DC

## 2018-09-20 MED ORDER — RISAQUAD PO CAPS: 1.0000 | ORAL_CAPSULE | Freq: Every day | ORAL | 0 refills | Status: DC

## 2018-09-20 NOTE — Progress Notes (Signed)
NAME: Steve Gilbert  DOB: 1951/06/14  MRN: [id number]  Date/Time: 09/20/2018 9:22 AM  Subjective:  Follow up visit ? Steve Gilbert is a 68 y.o. with a history of recent hospitalization 2/16-2/20 history ofbladder CA status post robot-assisted laparoscopic radical cystoprostatectomy, bilateral pelvic lymphadenectomy, ileal conduit and right inguinal hernia repair on 08/10/2018  Was hospitalized between for 2/16-2/20  for MSSA bacteremia and PORt was removed, TEE was negative, repeat blood culture from 09/04/18 was negative and he was discharged with 4 weeks of Iv cefazolin- until 10/01/18 He is 100% adherent to meds- one day had a problem with PICC line and it was resolved by his nurse. Doing well, no fever, no side effects from med. Has frequent stools, semiformed , no pain abdomen no skin rash, Eating well  Past Medical History:  Diagnosis Date  . Acid reflux   . Allergy   . Cancer ([place])   . COPD (chronic obstructive pulmonary disease) ([place])   . High cholesterol   . Hyperlipidemia 04/03/2018  . Hypertension   . Pneumonia     Past Surgical History:  Procedure Laterality Date  . CYSTOSCOPY W/ URETERAL STENT REMOVAL Right 08/10/2018   Procedure: CYSTOSCOPY WITH STENT REMOVAL;  Surgeon: Alexis Frock, MD;  [facility]: [facility];  Service: Urology;  Laterality: Right;  . CYSTOSCOPY WITH BIOPSY N/A 04/04/2018   Procedure: CYSTOSCOPY WITH TURBT;  Surgeon: Hollice Espy, MD;  Location: [facility];  Service: Urology;  Laterality: N/A;  . CYSTOSCOPY WITH URETEROSCOPY AND STENT PLACEMENT Right 04/04/2018   Procedure: CYSTOSCOPY WITH URETEROSCOPY AND STENT PLACEMENT;  Surgeon: Hollice Espy, MD;  Location: [facility];  Service: Urology;  Laterality: Right;  . NASAL SINUS SURGERY    . PORTA CATH INSERTION N/A 04/23/2018   Procedure: PORTA CATH INSERTION;  Surgeon: Algernon Huxley, MD;  Location: [facility];  Service: Cardiovascular;  Laterality: N/A;  . PORTA CATH REMOVAL N/A 09/04/2018    Procedure: PORTA CATH REMOVAL;  Surgeon: Katha Cabal, MD;  Location: [facility];  Service: Cardiovascular;  Laterality: N/A;  . TEE WITHOUT CARDIOVERSION N/A 09/05/2018   Procedure: TRANSESOPHAGEAL ECHOCARDIOGRAM (TEE);  Surgeon: Minna Merritts, MD;  Location: [facility];  Service: Cardiovascular;  Laterality: N/A;  . TRANSURETHRAL RESECTION OF PROSTATE N/A 04/04/2018   Procedure: TRANSURETHRAL RESECTION OF THE PROSTATE (TURP);  Surgeon: Hollice Espy, MD;  Location: [facility];  Service: Urology;  Laterality: N/A;  . URETERAL BIOPSY Right 04/04/2018   Procedure: URETERAL BIOPSY;  Surgeon: Hollice Espy, MD;  Location: [facility];  Service: Urology;  Laterality: Right;    [place] Lives with his wife Former smoker  Family History  Problem Relation Age of Onset  . Diabetes Mother   . Heart disease Father   . Diabetes Brother   . Heart attack Brother   . Heart disease Brother    Allergies  Allergen Reactions  . Contrast Media [Iodinated Diagnostic Agents] Itching and Rash   ? Current Outpatient Medications  Medication Sig Dispense Refill  . albuterol (PROVENTIL HFA;VENTOLIN HFA) 108 (90 Base) MCG/ACT inhaler Inhale 2 puffs into the lungs every 6 (six) hours as needed for wheezing or shortness of breath. 1 Inhaler 2  . amLODipine (NORVASC) 10 MG tablet Take 10 mg by mouth daily.    Marland Kitchen aspirin EC 81 MG tablet Take 81 mg by mouth daily.    Marland Kitchen atorvastatin (LIPITOR) 20 MG tablet Take 20 mg by mouth daily.    Marland Kitchen azelastine (ASTELIN) 0.1 %  nasal spray Place 1 spray into both nostrils 2 (two) times daily as needed (congestion). Use in each nostril as directed     . ceFAZolin (ANCEF) IVPB Inject 2 g into the vein every 8 (eight) hours for 25 days. Indication:  MSSA bacteremia Last Day of Therapy:  10/01/2018 Labs weekly while on IV antibiotics: _X_ CBC with differential  _X_ CMP  _X_ Please pull PIC at completion of IV antibiotics _ Fax weekly labs to [phone] Units 0   . Fluticasone-Salmeterol (ADVAIR) 250-50 MCG/DOSE AEPB Inhale 1 puff into the lungs 2 (two) times daily as needed (shortness of breath).     . hydrochlorothiazide (HYDRODIURIL) 12.5 MG tablet Take 12.5 mg by mouth daily.   9  . metoprolol tartrate (LOPRESSOR) 25 MG tablet TAKE 1 TABLET(25 MG) BY MOUTH TWICE DAILY 60 tablet 2  . pantoprazole (PROTONIX) 40 MG tablet Take 40 mg by mouth every evening.     . tiotropium (SPIRIVA) 18 MCG inhalation capsule Place 18 mcg into inhaler and inhale daily as needed (congestion).     . diphenhydrAMINE (BENADRYL) 25 MG tablet Take 25 mg by mouth daily as needed for allergies.    Marland Kitchen docusate sodium (COLACE) 100 MG capsule TAKE 1 CAPSULE BY MOUTH TWICE A DAY 60 capsule 0  . Multiple Vitamins-Minerals (MULTIVITAMIN WITH MINERALS) tablet Take 1 tablet by mouth daily.    . potassium chloride SA (KLOR-CON M20) 20 MEQ tablet Take 1 tablet (20 mEq total) by mouth daily. 7 tablet 0   No current facility-administered medications for this visit.      Abtx:  Anti-infectives (From admission, onward)   None      REVIEW OF SYSTEMS:  Const: negative fever, negative chills, negative weight loss Eyes: negative diplopia or visual changes, negative eye pain ENT: negative coryza, negative sore throat Resp: negative cough, hemoptysis, dyspnea Cards: negative for chest pain, palpitations, lower extremity edema GU: ileal conduit GI: Negative for abdominal pain, diarrhea, bleeding, constipation Skin: negative for rash and pruritus Heme: negative for easy bruising and gum/nose bleeding MS: negative for myalgias, arthralgias, back pain and muscle weakness Neurolo:negative for headaches, dizziness, vertigo, memory problems  Psych: negative for feelings of anxiety, depression  Endocrine: negative for thyroid, diabetes Allergy/Immunology- negative for any medication or food allergies ? Objective:  VITALS:  BP (!) 142/94 (BP Location: Right Arm, Patient Position: Sitting,  Cuff Size: Normal)   Pulse (!) 104   Temp 98.2 F (36.8 C) (Oral)   Wt 199 lb 2 oz (90.3 kg)   BMI 31.19 kg/m  PHYSICAL EXAM:  General: Alert, cooperative, no distress, appears stated age.  Head: Normocephalic, without obvious abnormality, atraumatic. Eyes: Conjunctivae clear, anicteric sclerae. Pupils are equal ENT Nares normal. No drainage or sinus tenderness. Lips, mucosa, and tongue normal. No Thrush Neck: Supple, symmetrical, no adenopathy, thyroid: non tender no carotid bruit and no JVD. Back: No CVA tenderness. Lungs: Clear to auscultation bilaterally. No Wheezing or Rhonchi. No rales. Heart: Regular rate and rhythm, no murmur, rub or gallop. Abdomen: Soft, non-tender,not distended. Bowel sounds normal. No masses, ileal conduit clear urine in the bag Extremities: left PICc line site fine atraumatic, no cyanosis. No edema. No clubbing Skin: No rashes or lesions. Or bruising Lymph: Cervical, supraclavicular normal. Neurologic: Grossly non-focal Pertinent Labs Lab Results  Wbc 5.2 HB 11.7 K 4.2 Cr 1.09  ? Impression/Recommendation ?Recent MSSa bacteremia , source was the port and it was removed, TEE neg and he is now on cefazolin  for 4 weeks- He says he may have trouble taking it three times a day when he will start taking his wife for chmo- if that is the case we will have to change to once a day medication- Like vanco, ceftriaxone or dapto- HE currently has finished 16 days of Iv cefazolin and has 12 more days . HE will let me know when the chemo will start next week and I can make plans accordingly.  Hypokalemia with diarrhea- started K last week and it has resolved- will continue K if diarrhea persist. Diarrhea -antibiotic related, he will start probiotic today. No fever or abdominal pain to suggest cdiff  Ca bladder s/p cystoprostatectomy and ileal conduit  ?AKI when he was in the hospital has  resolved ? ___________________________________________________ Discussed with patient in detail

## 2018-09-20 NOTE — Patient Instructions (Addendum)
You are here for a follow up after hospitlaization- you are on cefazolin until 3/16. You seem to be having difficulty doing it three times a day as you may have to take your wife for chemo- if that is the case let me know when you will start chemo and we will make some changes to your antibiotic to make it once a day- it will be a different antibiotic then. Please take a probioitc-it is over the counter pill , and also continue potassium My phone number is [phone]

## 2018-09-21 ENCOUNTER — Inpatient Hospital Stay: Payer: PPO | Attending: Internal Medicine

## 2018-09-21 ENCOUNTER — Other Ambulatory Visit: Payer: Self-pay

## 2018-09-21 ENCOUNTER — Encounter: Payer: Self-pay | Admitting: Internal Medicine

## 2018-09-21 ENCOUNTER — Inpatient Hospital Stay (HOSPITAL_BASED_OUTPATIENT_CLINIC_OR_DEPARTMENT_OTHER): Payer: PPO | Admitting: Internal Medicine

## 2018-09-21 VITALS — BP 152/90 | HR 87 | Temp 97.6°F | Resp 18 | Ht 67.0 in | Wt 199.0 lb

## 2018-09-21 DIAGNOSIS — Z79899 Other long term (current) drug therapy: Secondary | ICD-10-CM | POA: Diagnosis not present

## 2018-09-21 DIAGNOSIS — C675 Malignant neoplasm of bladder neck: Secondary | ICD-10-CM

## 2018-09-21 DIAGNOSIS — J449 Chronic obstructive pulmonary disease, unspecified: Secondary | ICD-10-CM | POA: Diagnosis not present

## 2018-09-21 DIAGNOSIS — I10 Essential (primary) hypertension: Secondary | ICD-10-CM

## 2018-09-21 DIAGNOSIS — Z7982 Long term (current) use of aspirin: Secondary | ICD-10-CM | POA: Insufficient documentation

## 2018-09-21 DIAGNOSIS — Z87891 Personal history of nicotine dependence: Secondary | ICD-10-CM

## 2018-09-21 LAB — CBC WITH DIFFERENTIAL/PLATELET
Abs Immature Granulocytes: 0.01 10*3/uL (ref 0.00–0.07)
BASOS PCT: 1 %
Basophils Absolute: 0 10*3/uL (ref 0.0–0.1)
Eosinophils Absolute: 0.1 10*3/uL (ref 0.0–0.5)
Eosinophils Relative: 3 %
HCT: 35.8 % — ABNORMAL LOW (ref 39.0–52.0)
Hemoglobin: 11.7 g/dL — ABNORMAL LOW (ref 13.0–17.0)
Immature Granulocytes: 0 %
Lymphocytes Relative: 17 %
Lymphs Abs: 0.8 10*3/uL (ref 0.7–4.0)
MCH: 29.7 pg (ref 26.0–34.0)
MCHC: 32.7 g/dL (ref 30.0–36.0)
MCV: 90.9 fL (ref 80.0–100.0)
Monocytes Absolute: 0.5 10*3/uL (ref 0.1–1.0)
Monocytes Relative: 11 %
Neutro Abs: 3.2 10*3/uL (ref 1.7–7.7)
Neutrophils Relative %: 68 %
Platelets: 270 10*3/uL (ref 150–400)
RBC: 3.94 MIL/uL — ABNORMAL LOW (ref 4.22–5.81)
RDW: 13 % (ref 11.5–15.5)
WBC: 4.7 10*3/uL (ref 4.0–10.5)
nRBC: 0 % (ref 0.0–0.2)

## 2018-09-21 LAB — BASIC METABOLIC PANEL
Anion gap: 5 (ref 5–15)
BUN: 18 mg/dL (ref 8–23)
CO2: 28 mmol/L (ref 22–32)
CREATININE: 1.29 mg/dL — AB (ref 0.61–1.24)
Calcium: 9.2 mg/dL (ref 8.9–10.3)
Chloride: 106 mmol/L (ref 98–111)
GFR calc Af Amer: >60 mL/min (ref >60)
GFR calc non Af Amer: 57 mL/min — ABNORMAL LOW (ref >60)
Glucose, Bld: 138 mg/dL — ABNORMAL HIGH (ref 70–99)
Potassium: 4.2 mmol/L (ref 3.5–5.1)
SODIUM: 139 mmol/L (ref 135–145)

## 2018-09-21 NOTE — Assessment & Plan Note (Addendum)
#  Transitional cell bladder cancer neo-adjuvant chemo- Currently s/p 4 cycles of dose dense MVAC; status post cystectomy-reviewed the pathology in detail ypTis ypN1.   #Recommend continued surveillance.  Recent CT scan in February 2020 NED.  Will repeat CT chest and pelvis in June.  # UTI/sepsis- resolved; s/p PICC IV ABx x4 weeks.   # HTN- continue norvasc/ metoprolol.  Stable  # DISPOSITION: # follow up in mid-June 2020; MD- CT C/A/P 1-2 days prior; labs-cbc/cmp-Dr.B

## 2018-09-21 NOTE — Progress Notes (Signed)
[place] NOTE  Patient Care Team: Kirk Ruths, MD as PCP - General (Internal Medicine)  CHIEF COMPLAINTS/PURPOSE OF CONSULTATION:  Bladder cancer  #  Oncology History   # BLADDER CANCER: pT2/cpT4  prostate/bladder neck Dr.Brandon TURP s/p  - INVASIVE UROTHELIAL CARCINOMA, HIGH-GRADE;  TUMOR INVADES BLADDER NECK MUSCULARIS PROPRIA. Mild-mod Right hydronephrosis [facility] RISK FEATURES]   # OCt 9t ddMVAC x4; Jan 24th cystectomy [Dr.Manny; [place]] ypTis ypN1; suriveillaince  # Feb 2020- UTI/sepsis- ICU  x3 weeks IV antibiotics/ s-p port removal.   # 2 Echo- 50% to 55%. [oct 2019]   # COPD/ not on O2; [quit 2009];    DIAGNOSIS: BLADDER CA  STAGE:  PT2/cT4 cN0    ;GOALS: cure  CURRENT/MOST RECENT THERAPY : MVAC dd      Cancer of bladder neck ([facility])     HISTORY OF PRESENTING ILLNESS:  Steve Gilbert 68 y.o.  male with newly diagnosed muscle invasive bladder cancer currently on neoadjuvant chemotherapy-followed by cystectomy is here for follow-up.  Patient was recently admitted to hospital for UTI with sepsis.  Is currently finishing up antibiotics followed by ID.  Patient significantly improved.  Denies any significant tingling or numbness at this time.  No rash.  Appetite improving.  Mild mild fatigue.   Review of Systems  Constitutional: Positive for malaise/fatigue. Negative for chills, diaphoresis, fever and weight loss.  HENT: Negative for nosebleeds and sore throat.   Eyes: Negative for double vision.  Cardiovascular: Negative for chest pain, palpitations, orthopnea and leg swelling.  Gastrointestinal: Negative for abdominal pain, blood in stool, constipation, diarrhea, heartburn, melena, nausea and vomiting.  Genitourinary: Negative for dysuria, frequency and urgency.  Musculoskeletal: Negative for back pain and joint pain.  Neurological: Negative for dizziness, tingling, focal weakness, weakness and headaches.  Endo/Heme/Allergies:  Does not bruise/bleed easily.  Psychiatric/Behavioral: Negative for depression. The patient is not nervous/anxious and does not have insomnia.      MEDICAL HISTORY:  Past Medical History:  Diagnosis Date  . Acid reflux   . Allergy   . Cancer ([place])   . COPD (chronic obstructive pulmonary disease) ([place])   . High cholesterol   . Hyperlipidemia 04/03/2018  . Hypertension   . Pneumonia     SURGICAL HISTORY: Past Surgical History:  Procedure Laterality Date  . CYSTOSCOPY W/ URETERAL STENT REMOVAL Right 08/10/2018   Procedure: CYSTOSCOPY WITH STENT REMOVAL;  Surgeon: Alexis Frock, MD;  [facility]: [facility];  Service: Urology;  Laterality: Right;  . CYSTOSCOPY WITH BIOPSY N/A 04/04/2018   Procedure: CYSTOSCOPY WITH TURBT;  Surgeon: Hollice Espy, MD;  Location: [facility];  Service: Urology;  Laterality: N/A;  . CYSTOSCOPY WITH URETEROSCOPY AND STENT PLACEMENT Right 04/04/2018   Procedure: CYSTOSCOPY WITH URETEROSCOPY AND STENT PLACEMENT;  Surgeon: Hollice Espy, MD;  Location: [facility];  Service: Urology;  Laterality: Right;  . NASAL SINUS SURGERY    . PORTA CATH INSERTION N/A 04/23/2018   Procedure: PORTA CATH INSERTION;  Surgeon: Algernon Huxley, MD;  Location: [place] [facility];  Service: Cardiovascular;  Laterality: N/A;  . PORTA CATH REMOVAL N/A 09/04/2018   Procedure: PORTA CATH REMOVAL;  Surgeon: Katha Cabal, MD;  Location: [place] [facility];  Service: Cardiovascular;  Laterality: N/A;  . TEE WITHOUT CARDIOVERSION N/A 09/05/2018   Procedure: TRANSESOPHAGEAL ECHOCARDIOGRAM (TEE);  Surgeon: Minna Merritts, MD;  Location: [facility];  Service: Cardiovascular;  Laterality: N/A;  . TRANSURETHRAL RESECTION OF PROSTATE N/A 04/04/2018   Procedure:  TRANSURETHRAL RESECTION OF THE PROSTATE (TURP);  Surgeon: Hollice Espy, MD;  Location: [facility];  Service: Urology;  Laterality: N/A;  . URETERAL BIOPSY Right 04/04/2018   Procedure: URETERAL BIOPSY;  Surgeon: Hollice Espy, MD;   Location: [facility];  Service: Urology;  Laterality: Right;    SOCIAL HISTORY: pt lives with his wife; currently retired.  Works part-time.  Remote history of smoking.  No alcohol. Social History   Socioeconomic History  . Marital status: Married    Spouse name: Not on file  . Number of children: Not on file  . Years of education: Not on file  . Highest education level: Not on file  Occupational History  . Not on file  Social Needs  . Financial resource strain: Not hard at all  . Food insecurity:    Worry: Patient refused    Inability: Patient refused  . Transportation needs:    Medical: Patient refused    Non-medical: Patient refused  Tobacco Use  . Smoking status: Former Smoker    Packs/day: 1.50    Years: 30.00    Pack years: 45.00    Types: Cigarettes    Last attempt to quit: 2009    Years since quitting: 11.2  . Smokeless tobacco: Former Network engineer and Sexual Activity  . Alcohol use: Yes    Alcohol/week: 7.0 standard drinks    Types: 5 Cans of beer, 2 Standard drinks or equivalent per week    Comment: ocassional   . Drug use: Not Currently  . Sexual activity: Not Currently  Lifestyle  . Physical activity:    Days per week: Patient refused    Minutes per session: Patient refused  . Stress: Not at all  Relationships  . Social connections:    Talks on phone: Patient refused    Gets together: Patient refused    Attends religious service: Patient refused    Active member of club or organization: Patient refused    Attends meetings of clubs or organizations: Patient refused    Relationship status: Patient refused  . Intimate partner violence:    Fear of current or ex partner: Patient refused    Emotionally abused: Patient refused    Physically abused: Patient refused    Forced sexual activity: Patient refused  Other Topics Concern  . Not on file  Social History Narrative  . Not on file    FAMILY HISTORY: Family History  Problem Relation Age of Onset   . Diabetes Mother   . Heart disease Father   . Diabetes Brother   . Heart attack Brother   . Heart disease Brother     ALLERGIES:  is allergic to contrast media [iodinated diagnostic agents].  MEDICATIONS:  Current Outpatient Medications  Medication Sig Dispense Refill  . albuterol (PROVENTIL HFA;VENTOLIN HFA) 108 (90 Base) MCG/ACT inhaler Inhale 2 puffs into the lungs every 6 (six) hours as needed for wheezing or shortness of breath. 1 Inhaler 2  . amLODipine (NORVASC) 10 MG tablet Take 10 mg by mouth daily.    Marland Kitchen aspirin EC 81 MG tablet Take 81 mg by mouth daily.    Marland Kitchen atorvastatin (LIPITOR) 20 MG tablet Take 20 mg by mouth daily.    Marland Kitchen azelastine (ASTELIN) 0.1 % nasal spray Place 1 spray into both nostrils 2 (two) times daily as needed (congestion). Use in each nostril as directed     . ceFAZolin (ANCEF) IVPB Inject 2 g into the vein every 8 (eight) hours for 25 days.  Indication:  MSSA bacteremia Last Day of Therapy:  10/01/2018 Labs weekly while on IV antibiotics: _X_ CBC with differential  _X_ CMP  _X_ Please pull PIC at completion of IV antibiotics _ Fax weekly labs to [phone] Units 0  . Fluticasone-Salmeterol (ADVAIR) 250-50 MCG/DOSE AEPB Inhale 1 puff into the lungs 2 (two) times daily as needed (shortness of breath).     . hydrochlorothiazide (HYDRODIURIL) 12.5 MG tablet Take 12.5 mg by mouth daily.   9  . metoprolol tartrate (LOPRESSOR) 25 MG tablet TAKE 1 TABLET(25 MG) BY MOUTH TWICE DAILY 60 tablet 2  . pantoprazole (PROTONIX) 40 MG tablet Take 40 mg by mouth every evening.     . potassium chloride SA (KLOR-CON M20) 20 MEQ tablet Take 1 tablet (20 mEq total) by mouth daily. 14 tablet 0  . tiotropium (SPIRIVA) 18 MCG inhalation capsule Place 18 mcg into inhaler and inhale daily as needed (congestion).     Marland Kitchen acidophilus (RISAQUAD) CAPS capsule Take 1 capsule by mouth daily. (Patient not taking: Reported on 09/21/2018) 30 capsule 0  . diphenhydrAMINE (BENADRYL) 25  MG tablet Take 25 mg by mouth daily as needed for allergies.    Marland Kitchen docusate sodium (COLACE) 100 MG capsule TAKE 1 CAPSULE BY MOUTH TWICE A DAY (Patient not taking: Reported on 09/20/2018) 60 capsule 0  . Multiple Vitamins-Minerals (MULTIVITAMIN WITH MINERALS) tablet Take 1 tablet by mouth daily.     No current facility-administered medications for this visit.       Marland Kitchen  PHYSICAL EXAMINATION: ECOG PERFORMANCE STATUS: 1 - Symptomatic but completely ambulatory  Vitals:   09/21/18 1030  BP: (!) 152/90  Pulse: 87  Resp: 18  Temp: 97.6 F (36.4 C)   Filed Weights   09/21/18 1030  Weight: 199 lb (90.3 kg)    Physical Exam  Constitutional: He is oriented to person, place, and time and well-developed, well-nourished, and in no distress.  He is alone.  Walking himself.  HENT:  Head: Normocephalic and atraumatic.  Mouth/Throat: Oropharynx is clear and moist. No oropharyngeal exudate.  Eyes: Pupils are equal, round, and reactive to light.  Neck: Normal range of motion. Neck supple.  Cardiovascular: Normal rate and regular rhythm.  Pulmonary/Chest: Breath sounds normal. No respiratory distress.  Abdominal: Soft. Bowel sounds are normal. He exhibits no distension and no mass. There is no abdominal tenderness. There is no rebound and no guarding.  Urostomy.   Musculoskeletal: Normal range of motion.        General: No tenderness or edema.  Neurological: He is alert and oriented to person, place, and time.  Skin: Skin is warm.  Psychiatric: Affect normal.     LABORATORY DATA:  I have reviewed the data as listed Lab Results  Component Value Date   WBC 4.7 09/21/2018   HGB 11.7 (L) 09/21/2018   HCT 35.8 (L) 09/21/2018   MCV 90.9 09/21/2018   PLT 270 09/21/2018   Recent Labs    07/04/18 0900 08/09/18 1401  09/02/18 2042  09/05/18 0430 09/06/18 0501 09/21/18 0951  NA 143 139   < > 139   < > 144 140 139  K 4.6 4.0   < > 3.1*   < > 3.9 3.5 4.2  CL 103 103   < > 100   < > 114*  109 106  CO2 28 29   < > 27   < > 25 25 28   GLUCOSE 209* 124*   < > 124*   < >  107* 101* 138*  BUN 13 20   < > 35*   < > 27* 25* 18  CREATININE 1.13 1.19   < > 2.56*   < > 1.62* 1.39* 1.29*  CALCIUM 9.4 9.3   < > 8.2*   < > 8.6* 8.3* 9.2  GFRNONAA >60 >60   < > 25*   < > 43* 52* 57*  GFRAA >60 >60   < > 29*   < > 50* >60 >60  PROT 6.2* 6.6  --  6.6  --   --   --   --   ALBUMIN 3.8 4.2  --  3.5  --   --   --   --   AST 22 23  --  39  --   --   --   --   ALT 23 26  --  22  --   --   --   --   ALKPHOS 79 63  --  64  --   --   --   --   BILITOT 0.5 1.3*  --  1.7*  --   --   --   --    < > = values in this interval not displayed.    RADIOGRAPHIC STUDIES: I have personally reviewed the radiological images as listed and agreed with the findings in the report. Ct Abdomen Pelvis Wo Contrast  Result Date: 09/02/2018 CLINICAL DATA:  Bladder removed on January 14th for bladder cancer. Now with fever and vomiting. Altered mental status. EXAM: CT ABDOMEN AND PELVIS WITHOUT CONTRAST TECHNIQUE: Multidetector CT imaging of the abdomen and pelvis was performed following the standard protocol without IV contrast. COMPARISON:  CT abdomen dated 07/06/2018. FINDINGS: Lower chest: No acute abnormality. Hepatobiliary: No focal liver abnormality is seen. No gallstones, gallbladder wall thickening, or biliary dilatation. Pancreas: Pancreas is unremarkable. Spleen: Normal in size without focal abnormality. Adrenals/Urinary Tract: Status post bladder resection and ileal conduit creation. Catheter in place at the ileal conduit outlet at the RIGHT lower abdominal wall. Bilateral hydronephrosis and hydroureter, mild to moderate in degree. LEFT perinephric inflammation/fluid stranding. Stomach/Bowel: No dilated large or small bowel loops. Stomach appears normal. Appendix is normal. Vascular/Lymphatic: Aortic atherosclerosis. No enlarged lymph nodes seen. Reproductive: Apparent prostatectomy with the aforementioned bladder  resection. Other: No abscess collection seen. No free intraperitoneal air. Expected postsurgical changes within the subcutaneous soft tissues of the lower abdomen and pelvis. Musculoskeletal: No acute or suspicious osseous finding. IMPRESSION: 1. Status post bladder resection and ileal conduit creation. Catheter in place at the ileal conduit outlet at the RIGHT lower abdominal wall. 2. Prominent LEFT perinephric inflammation/fluid stranding. Findings are suspicious for ascending urinary tract infection. Recommend correlation with urinalysis. 3. Bilateral hydronephrosis and hydroureter, mild to moderate in degree. This may be expected given the recent ileal conduit creation. Normal urine output? 4. No abscess collection seen. No free intraperitoneal air. No bowel obstruction. Aortic Atherosclerosis (ICD10-I70.0). Electronically Signed   By: Franki Cabot M.D.   On: 09/02/2018 23:07   Dg Chest Port 1 View  Result Date: 09/03/2018 CLINICAL DATA:  Sepsis.  Fever.  Follow-up exam. EXAM: PORTABLE CHEST 1 VIEW COMPARISON:  09/02/2018 FINDINGS: Cardiac silhouette is normal in size. No mediastinal or hilar masses. No evidence of adenopathy. Clear lungs.  No pleural effusion or pneumothorax. Skeletal structures are intact. Right internal jugular central venous Port-A-Cath is stable. IMPRESSION: No active disease. Electronically Signed   By: Lajean Manes M.D.   On: 09/03/2018  08:15   Dg Chest Port 1 View  Result Date: 09/02/2018 CLINICAL DATA:  Bladder removed on January 14th for bladder cancer. Fever and altered mental status. EXAM: PORTABLE CHEST 1 VIEW COMPARISON:  Chest x-ray dated 05/15/2018. FINDINGS: RIGHT IJ Port-A-Cath appears stable in position with tip at the level of the upper SVC. Heart size and mediastinal contours are stable. Lungs are clear. No pleural effusions seen. Osseous structures about the chest are unremarkable. IMPRESSION: No active disease.  No evidence of pneumonia. Electronically Signed    By: Franki Cabot M.D.   On: 09/02/2018 21:31   [name] Ekg Site Rite  Result Date: 09/06/2018 If [facility] image not attached, placement could not be confirmed due to current cardiac rhythm.   ASSESSMENT & PLAN:   Cancer of bladder neck (HCC) #Transitional cell bladder cancer neo-adjuvant chemo- Currently s/p 4 cycles of dose dense MVAC; status post cystectomy-reviewed the pathology in detail ypTis ypN1.   #Recommend continued surveillance.  Recent CT scan in February 2020 NED.  Will repeat CT chest and pelvis in June.  # UTI/sepsis- resolved; s/p PICC IV ABx x4 weeks.   # HTN- continue norvasc/ metoprolol.  Stable  # DISPOSITION: # follow up in mid-June 2020; MD- CT C/A/P 1-2 days prior; labs-cbc/cmp-Dr.B  All questions were answered. The patient knows to call the clinic with any problems, questions or concerns.     Cammie Sickle, MD 09/27/2018 7:40 PM

## 2018-09-25 DIAGNOSIS — C679 Malignant neoplasm of bladder, unspecified: Secondary | ICD-10-CM | POA: Diagnosis not present

## 2018-09-25 DIAGNOSIS — Z48815 Encounter for surgical aftercare following surgery on the digestive system: Secondary | ICD-10-CM | POA: Diagnosis not present

## 2018-09-25 DIAGNOSIS — N136 Pyonephrosis: Secondary | ICD-10-CM | POA: Diagnosis not present

## 2018-09-25 DIAGNOSIS — I10 Essential (primary) hypertension: Secondary | ICD-10-CM | POA: Diagnosis not present

## 2018-09-25 DIAGNOSIS — B9561 Methicillin susceptible Staphylococcus aureus infection as the cause of diseases classified elsewhere: Secondary | ICD-10-CM | POA: Diagnosis not present

## 2018-09-25 DIAGNOSIS — A4101 Sepsis due to Methicillin susceptible Staphylococcus aureus: Secondary | ICD-10-CM | POA: Diagnosis not present

## 2018-09-25 DIAGNOSIS — Z436 Encounter for attention to other artificial openings of urinary tract: Secondary | ICD-10-CM | POA: Diagnosis not present

## 2018-09-25 DIAGNOSIS — Z483 Aftercare following surgery for neoplasm: Secondary | ICD-10-CM | POA: Diagnosis not present

## 2018-09-25 DIAGNOSIS — J449 Chronic obstructive pulmonary disease, unspecified: Secondary | ICD-10-CM | POA: Diagnosis not present

## 2018-09-28 DIAGNOSIS — N1330 Unspecified hydronephrosis: Secondary | ICD-10-CM | POA: Diagnosis not present

## 2018-10-25 ENCOUNTER — Other Ambulatory Visit: Payer: Self-pay | Admitting: Internal Medicine

## 2018-10-25 NOTE — Progress Notes (Signed)
Please make sure pt has appts with me in mid- June 2020/ labs- cbc/cmp.   Pt will call to set CT scans prior to appt. Discussed with pts wife.

## 2018-11-15 ENCOUNTER — Telehealth: Payer: Self-pay

## 2018-11-15 NOTE — Telephone Encounter (Signed)
Telephone call to patient about SCP visit.  Informed patient I will mail him a copy of SCP and treatment summary along with [facility] answers survivorship booklet and I will call him Wednesday May 6th at 11:30 to dicuss care plan and also APP will talk to him afterwards.  Patient very much in agreement with plan.

## 2018-11-20 ENCOUNTER — Other Ambulatory Visit: Payer: Self-pay

## 2018-11-20 DIAGNOSIS — A4901 Methicillin susceptible Staphylococcus aureus infection, unspecified site: Secondary | ICD-10-CM | POA: Diagnosis not present

## 2018-11-20 DIAGNOSIS — R7881 Bacteremia: Secondary | ICD-10-CM | POA: Diagnosis not present

## 2018-11-20 DIAGNOSIS — C678 Malignant neoplasm of overlapping sites of bladder: Secondary | ICD-10-CM | POA: Diagnosis not present

## 2018-11-20 DIAGNOSIS — C775 Secondary and unspecified malignant neoplasm of intrapelvic lymph nodes: Secondary | ICD-10-CM | POA: Diagnosis not present

## 2018-11-20 DIAGNOSIS — Z936 Other artificial openings of urinary tract status: Secondary | ICD-10-CM | POA: Diagnosis not present

## 2018-11-20 DIAGNOSIS — N130 Hydronephrosis with ureteropelvic junction obstruction: Secondary | ICD-10-CM | POA: Diagnosis not present

## 2018-11-21 ENCOUNTER — Inpatient Hospital Stay: Payer: PPO | Attending: Oncology | Admitting: Oncology

## 2018-11-21 DIAGNOSIS — C675 Malignant neoplasm of bladder neck: Secondary | ICD-10-CM

## 2018-11-21 NOTE — Progress Notes (Signed)
CLINIC:  Survivorship: Telephone visit   REASON FOR VISIT:  Routine follow-up for history of Transitional Cell Bladder cancer.    I connected with Steve Gilbert on 11/21/18 at 12:05 pm by telephone visit and verified that I am speaking with the correct person using two identifiers.   I discussed the limitations, risks, security and privacy concerns of performing an evaluation and management service by telemedicine and the availability of in-person appointments. I also discussed with the patient that there may be a patient responsible charge related to this service. The patient expressed understanding and agreed to proceed.   Other persons participating in the visit and their role in the encounter:   Patient's location: Home  Provider's location: Office  BRIEF ONCOLOGIC HISTORY:  Oncology History   # BLADDER CANCER: pT2/cpT4  prostate/bladder neck Dr.Brandon TURP s/p  - INVASIVE UROTHELIAL CARCINOMA, HIGH-GRADE;  TUMOR INVADES BLADDER NECK MUSCULARIS PROPRIA. Mild-mod Right hydronephrosis [facility] RISK FEATURES]   # OCt 9t ddMVAC x4; Jan 24th cystectomy [Dr.Manny; [place]] ypTis ypN1; suriveillaince  # Feb 2020- UTI/sepsis- ICU  x3 weeks IV antibiotics/ s-p port removal.   # 2 Echo- 50% to 55%. [oct 2019]   # COPD/ not on O2; [quit 2009];    DIAGNOSIS: BLADDER CA  STAGE:  PT2/cT4 cN0    ;GOALS: cure  CURRENT/MOST RECENT THERAPY : MVAC dd      Cancer of bladder neck ([place])    INTERVAL HISTORY:  Steve Gilbert presents to the Survivorship Clinic today for routine follow-up for his history of Transitional Cell Bladder cancer.  Overall, he reports feeling quite well. Appetite has improved. Some taste bud changes. Urinating normal. No fever no infection.   REVIEW OF SYSTEMS:   Review of Systems  Constitutional: Positive for malaise/fatigue. Negative for chills, fever and weight loss.  HENT: Negative for congestion, ear pain and tinnitus.   Eyes: Negative.  Negative for blurred  vision and double vision.  Respiratory: Negative.  Negative for cough, sputum production and shortness of breath.   Cardiovascular: Negative.  Negative for chest pain, palpitations and leg swelling.  Gastrointestinal: Positive for constipation. Negative for abdominal pain, diarrhea, nausea and vomiting.       Taste bud changes  Genitourinary: Negative for dysuria, frequency and urgency.  Musculoskeletal: Negative for back pain and falls.  Skin: Negative.  Negative for rash.  Neurological: Positive for weakness. Negative for headaches.  Endo/Heme/Allergies: Negative.  Does not bruise/bleed easily.  Psychiatric/Behavioral: Negative.  Negative for depression. The patient is not nervous/anxious and does not have insomnia.     PAST MEDICAL/SURGICAL HISTORY:  Past Medical History:  Diagnosis Date  . Acid reflux   . Allergy   . Cancer ([place])   . COPD (chronic obstructive pulmonary disease) ([place])   . High cholesterol   . Hyperlipidemia 04/03/2018  . Hypertension   . Pneumonia    Past Surgical History:  Procedure Laterality Date  . CYSTOSCOPY W/ URETERAL STENT REMOVAL Right 08/10/2018   Procedure: CYSTOSCOPY WITH STENT REMOVAL;  Surgeon: Alexis Frock, MD;  Location: [facility];  Service: Urology;  Laterality: Right;  . CYSTOSCOPY WITH BIOPSY N/A 04/04/2018   Procedure: CYSTOSCOPY WITH TURBT;  Surgeon: Hollice Espy, MD;  Location: [facility] ORS;  Service: Urology;  Laterality: N/A;  . CYSTOSCOPY WITH URETEROSCOPY AND STENT PLACEMENT Right 04/04/2018   Procedure: CYSTOSCOPY WITH URETEROSCOPY AND STENT PLACEMENT;  Surgeon: [place]ice Espy, MD;  Location: [facility] ORS;  Service: Urology;  Laterality: Right;  . NASAL SINUS SURGERY    .  PORTA CATH INSERTION N/A 04/23/2018   Procedure: PORTA CATH INSERTION;  Surgeon: Algernon Huxley, MD;  Location: [place] CV LAB;  Service: Cardiovascular;  Laterality: N/A;  . PORTA CATH REMOVAL N/A 09/04/2018   Procedure: PORTA CATH REMOVAL;  Surgeon: Katha Cabal,  MD;  Location: [place] CV LAB;  Service: Cardiovascular;  Laterality: N/A;  . TEE WITHOUT CARDIOVERSION N/A 09/05/2018   Procedure: TRANSESOPHAGEAL ECHOCARDIOGRAM (TEE);  Surgeon: Minna Merritts, MD;  Location: [facility] ORS;  Service: Cardiovascular;  Laterality: N/A;  . TRANSURETHRAL RESECTION OF PROSTATE N/A 04/04/2018   Procedure: TRANSURETHRAL RESECTION OF THE PROSTATE (TURP);  Surgeon: Hollice Espy, MD;  Location: [facility] ORS;  Service: Urology;  Laterality: N/A;  . URETERAL BIOPSY Right 04/04/2018   Procedure: URETERAL BIOPSY;  Surgeon: [place]ice Espy, MD;  Location: [facility] ORS;  Service: Urology;  Laterality: Right;    ALLERGIES:  Allergies  Allergen Reactions  . Contrast Media [Iodinated Diagnostic Agents] Itching and Rash     CURRENT MEDICATIONS:  Outpatient Encounter Medications as of 11/21/2018  Medication Sig Note  . acidophilus (RISAQUAD) CAPS capsule Take 1 capsule by mouth daily. (Patient not taking: Reported on 09/21/2018)   . albuterol (PROVENTIL HFA;VENTOLIN HFA) 108 (90 Base) MCG/ACT inhaler Inhale 2 puffs into the lungs every 6 (six) hours as needed for wheezing or shortness of breath.   Marland Kitchen amLODipine (NORVASC) 10 MG tablet Take 10 mg by mouth daily.   Marland Kitchen aspirin EC 81 MG tablet Take 81 mg by mouth daily.   Marland Kitchen atorvastatin (LIPITOR) 20 MG tablet Take 20 mg by mouth daily.   Marland Kitchen azelastine (ASTELIN) 0.1 % nasal spray Place 1 spray into both nostrils 2 (two) times daily as needed (congestion). Use in each nostril as directed    . diphenhydrAMINE (BENADRYL) 25 MG tablet Take 25 mg by mouth daily as needed for allergies.   Marland Kitchen docusate sodium (COLACE) 100 MG capsule TAKE 1 CAPSULE BY MOUTH TWICE A DAY (Patient not taking: Reported on 09/20/2018)   . Fluticasone-Salmeterol (ADVAIR) 250-50 MCG/DOSE AEPB Inhale 1 puff into the lungs 2 (two) times daily as needed (shortness of breath).    . hydrochlorothiazide (HYDRODIURIL) 12.5 MG tablet Take 12.5 mg by mouth daily.    . metoprolol  tartrate (LOPRESSOR) 25 MG tablet TAKE 1 TABLET(25 MG) BY MOUTH TWICE DAILY   . Multiple Vitamins-Minerals (MULTIVITAMIN WITH MINERALS) tablet Take 1 tablet by mouth daily. 08/06/2018: On hold for procedure  . pantoprazole (PROTONIX) 40 MG tablet Take 40 mg by mouth every evening.    . potassium chloride SA (KLOR-CON M20) 20 MEQ tablet Take 1 tablet (20 mEq total) by mouth daily.   Marland Kitchen tiotropium (SPIRIVA) 18 MCG inhalation capsule Place 18 mcg into inhaler and inhale daily as needed (congestion).     No facility-administered encounter medications on file as of 11/21/2018.     ONCOLOGIC FAMILY HISTORY:  Family History  Problem Relation Age of Onset  . Diabetes Mother   . Heart disease Father   . Diabetes Brother   . Heart attack Brother   . Heart disease Brother    SOCIAL HISTORY:  Steve Gilbert lives with her spouse in [place], [place].  Steve Gilbert  has 3 children and they live in [place].  Currently working in maintenance and machine work; approximately 4 hours per day. Denies any current or history of tobacco, alcohol, or illicit drug use.   PHYSICAL EXAMINATION:  Vital Signs: There were no vitals filed for this  visit. There were no vitals filed for this visit.  Physical Exam Constitutional:      Appearance: Normal appearance.  HENT:     Head: Normocephalic and atraumatic.  Eyes:     Pupils: Pupils are equal, round, and reactive to light.  Neck:     Musculoskeletal: Normal range of motion.  Cardiovascular:     Rate and Rhythm: Normal rate and regular rhythm.     Heart sounds: Normal heart sounds. No murmur.  Pulmonary:     Effort: Pulmonary effort is normal.     Breath sounds: Normal breath sounds. No wheezing.  Abdominal:     General: Bowel sounds are normal. There is no distension.     Palpations: Abdomen is soft.     Tenderness: There is no abdominal tenderness.  Genitourinary:    Comments: Urostomy in place Musculoskeletal: Normal range of motion.   Skin:    General: Skin is warm and dry.     Findings: No rash.  Neurological:     Mental Status: He is alert and oriented to person, place, and time.  Psychiatric:        Judgment: Judgment normal.    LABORATORY DATA:  Lab Results  Component Value Date   WBC 4.7 09/21/2018   HGB 11.7 (L) 09/21/2018   HCT 35.8 (L) 09/21/2018   MCV 90.9 09/21/2018   PLT 270 09/21/2018     Chemistry      Component Value Date/Time   NA 139 09/21/2018 0951   K 4.2 09/21/2018 0951   CL 106 09/21/2018 0951   CO2 28 09/21/2018 0951   BUN 18 09/21/2018 0951   CREATININE 1.29 (H) 09/21/2018 0951      Component Value Date/Time   CALCIUM 9.2 09/21/2018 0951   ALKPHOS 64 09/02/2018 2042   AST 39 09/02/2018 2042   ALT 22 09/02/2018 2042   BILITOT 1.7 (H) 09/02/2018 2042      DIAGNOSTIC IMAGING:   09/02/18 CT abdomen pelvis WO contrast   IMPRESSION: 1. Status post bladder resection and ileal conduit creation. Catheter in place at the ileal conduit outlet at the RIGHT lower abdominal wall. 2. Prominent LEFT perinephric inflammation/fluid stranding. Findings are suspicious for ascending urinary tract infection. Recommend correlation with urinalysis. 3. Bilateral hydronephrosis and hydroureter, mild to moderate in degree. This may be expected given the recent ileal conduit creation. Normal urine output? 4. No abscess collection seen. No free intraperitoneal air. No bowel obstruction.  Aortic Atherosclerosis (ICD10-I70.0).  ASSESSMENT AND PLAN:  MrHiawatha Gilbert is a pleasant 68 y.o. male with history of Transitional Cell Bladder Cancer , diagnosed on 04/16/18; treated with 4 cycles of methotrexate, vinblastine, Adriamycin and cisplatin.  Treatment from 04/25/2018 through 06/19/2018. SteveGilbert presents to the Survivorship Clinic for surveillance and routine follow-up.   1. History of Transitional Call Bladder Cancer:  Steve Gilbert is currently clinically and radiographically without evidence of  disease or recurrence of Bladder cancer. He will follow-up at the [facility]  in 4 months from his last visit with Dr. Rogue Bussing  with labs, history, and physical exam per surveillance protocol.  I encouraged him to call me with any questions or concerns before his next visit at the cancer center, and I would be happy to see the patient sooner, if needed.    #. Problem(s) at Visit: None  # Urostomy Supplies: In need of urostomy supplies. Waiting on [employer]) for urostomy bags (day and night), barrier rings and connector pieces. Instructed patient to contact  clinic if unable to get supplies and we could help order. States he has inventory being delivered today.  [facility] supply #: [phone] [place]: [phone]  #. Cancer screening:  Due to Steve Gilbert's history and age, he should receive screening for skin cancers, breast cancer, colon cancer. The patient was encouraged to follow-up with his PCP for appropriate cancer screenings.   #. Health maintenance and wellness promotion: Steve Gilbert was encouraged to consume 5-7 servings of fruits and vegetables per day. The patient was also encouraged to engage in moderate to vigorous exercise for 30 minutes per day most days of the week. Steve Gilbert was instructed to limit her alcohol consumption and continue to abstain from tobacco use.    Dispo:  -Return to cancer center to see Survivorship NP as needed.  -RTC as scheduled on 01/02/19 after imaging with labs and MD assessment.  - RTC as scheduled with Dr. Tresa Moore with [facility].  I discussed the assessment and treatment plan with the patient. The patient was provided an opportunity to ask questions and all were answered. The patient agreed with the plan and demonstrated an understanding of the instructions.   The patient was advised to call back or seek an in-person evaluation if the symptoms worsen or if the condition fails to improve as anticipated.   I provided 30 minutes of non  face to face telephone visit time during this encounter, and > 50% was spent counseling as documented under my assessment & plan.  Rulon Abide, NP Survivorship Program [facility] [phone]   Note: PRIMARY CARE PROVIDER Kirk Ruths, [place] [phone]

## 2018-11-21 NOTE — Progress Notes (Signed)
Survivorship Care Plan visit completed via telephone.  Treatment summary reviewed and previously mailed to patient.  ASCO answers booklet reviewed and mailed to patient.  CARE program and Cancer Transitions discussed with patient along with other resources cancer center offers to patients and caregivers.  Patient verbalized understanding.

## 2018-11-30 ENCOUNTER — Ambulatory Visit
Admission: RE | Admit: 2018-11-30 | Discharge: 2018-11-30 | Disposition: A | Discharge status: Home / Self Care | Payer: PPO | Source: Ambulatory Visit | Attending: Oncology | Admitting: Oncology

## 2018-11-30 ENCOUNTER — Other Ambulatory Visit: Payer: Self-pay

## 2018-11-30 DIAGNOSIS — C675 Malignant neoplasm of bladder neck: Secondary | ICD-10-CM

## 2018-11-30 DIAGNOSIS — C679 Malignant neoplasm of bladder, unspecified: Secondary | ICD-10-CM | POA: Diagnosis not present

## 2018-12-03 NOTE — Progress Notes (Signed)
Hey Dr. Jacinto Reap,   You have a televisit with him this week. I saw him in survivorship a few weeks back and saw he did not have a CT scan ordered for your visit per your last note. Got it ordered and here are the results.  Faythe Casa, NP 12/03/2018 10:14 AM

## 2018-12-04 ENCOUNTER — Other Ambulatory Visit: Payer: Self-pay | Admitting: Internal Medicine

## 2018-12-19 DIAGNOSIS — C775 Secondary and unspecified malignant neoplasm of intrapelvic lymph nodes: Secondary | ICD-10-CM | POA: Diagnosis not present

## 2018-12-19 DIAGNOSIS — A4901 Methicillin susceptible Staphylococcus aureus infection, unspecified site: Secondary | ICD-10-CM | POA: Diagnosis not present

## 2018-12-19 DIAGNOSIS — R7881 Bacteremia: Secondary | ICD-10-CM | POA: Diagnosis not present

## 2018-12-19 DIAGNOSIS — Z936 Other artificial openings of urinary tract status: Secondary | ICD-10-CM | POA: Diagnosis not present

## 2018-12-19 DIAGNOSIS — N130 Hydronephrosis with ureteropelvic junction obstruction: Secondary | ICD-10-CM | POA: Diagnosis not present

## 2018-12-19 DIAGNOSIS — C678 Malignant neoplasm of overlapping sites of bladder: Secondary | ICD-10-CM | POA: Diagnosis not present

## 2019-01-02 ENCOUNTER — Inpatient Hospital Stay (HOSPITAL_BASED_OUTPATIENT_CLINIC_OR_DEPARTMENT_OTHER): Payer: PPO | Admitting: Internal Medicine

## 2019-01-02 ENCOUNTER — Inpatient Hospital Stay: Payer: PPO | Attending: Internal Medicine

## 2019-01-02 ENCOUNTER — Other Ambulatory Visit: Payer: Self-pay

## 2019-01-02 DIAGNOSIS — C675 Malignant neoplasm of bladder neck: Secondary | ICD-10-CM | POA: Diagnosis not present

## 2019-01-02 DIAGNOSIS — N183 Chronic kidney disease, stage 3 (moderate): Secondary | ICD-10-CM | POA: Diagnosis not present

## 2019-01-02 DIAGNOSIS — I129 Hypertensive chronic kidney disease with stage 1 through stage 4 chronic kidney disease, or unspecified chronic kidney disease: Secondary | ICD-10-CM

## 2019-01-02 DIAGNOSIS — Z9221 Personal history of antineoplastic chemotherapy: Secondary | ICD-10-CM | POA: Diagnosis not present

## 2019-01-02 DIAGNOSIS — Z79899 Other long term (current) drug therapy: Secondary | ICD-10-CM | POA: Insufficient documentation

## 2019-01-02 LAB — COMPREHENSIVE METABOLIC PANEL
ALT: 22 U/L (ref 0–44)
AST: 22 U/L (ref 15–41)
Albumin: 4.1 g/dL (ref 3.5–5.0)
Alkaline Phosphatase: 79 U/L (ref 38–126)
Anion gap: 10 (ref 5–15)
BUN: 20 mg/dL (ref 8–23)
CO2: 29 mmol/L (ref 22–32)
Calcium: 9.3 mg/dL (ref 8.9–10.3)
Chloride: 101 mmol/L (ref 98–111)
Creatinine, Ser: 1.46 mg/dL — ABNORMAL HIGH (ref 0.61–1.24)
GFR calc Af Amer: 57 mL/min — ABNORMAL LOW (ref >60)
GFR calc non Af Amer: 49 mL/min — ABNORMAL LOW (ref >60)
Glucose, Bld: 149 mg/dL — ABNORMAL HIGH (ref 70–99)
Potassium: 4.3 mmol/L (ref 3.5–5.1)
Sodium: 140 mmol/L (ref 135–145)
Total Bilirubin: 0.7 mg/dL (ref 0.3–1.2)
Total Protein: 7.3 g/dL (ref 6.5–8.1)

## 2019-01-02 LAB — CBC WITH DIFFERENTIAL/PLATELET
Abs Immature Granulocytes: 0.02 10*3/uL (ref 0.00–0.07)
Basophils Absolute: 0 10*3/uL (ref 0.0–0.1)
Basophils Relative: 1 %
Eosinophils Absolute: 0.2 10*3/uL (ref 0.0–0.5)
Eosinophils Relative: 3 %
HCT: 43 % (ref 39.0–52.0)
Hemoglobin: 14.1 g/dL (ref 13.0–17.0)
Immature Granulocytes: 0 %
Lymphocytes Relative: 17 %
Lymphs Abs: 1 10*3/uL (ref 0.7–4.0)
MCH: 28.5 pg (ref 26.0–34.0)
MCHC: 32.8 g/dL (ref 30.0–36.0)
MCV: 86.9 fL (ref 80.0–100.0)
Monocytes Absolute: 0.5 10*3/uL (ref 0.1–1.0)
Monocytes Relative: 10 %
Neutro Abs: 3.8 10*3/uL (ref 1.7–7.7)
Neutrophils Relative %: 69 %
Platelets: 225 10*3/uL (ref 150–400)
RBC: 4.95 MIL/uL (ref 4.22–5.81)
RDW: 13.4 % (ref 11.5–15.5)
WBC: 5.5 10*3/uL (ref 4.0–10.5)
nRBC: 0 % (ref 0.0–0.2)

## 2019-01-02 NOTE — Progress Notes (Signed)
CT chest and abdomen performed on 11/30/18 and does not offer any problems today.

## 2019-01-02 NOTE — Progress Notes (Signed)
[place] NOTE  Patient Care Team: Kirk Ruths, MD as PCP - General (Internal Medicine) Hollice Espy, MD as Consulting Physician (Urology) Cammie Sickle, MD as Consulting Physician (Internal Medicine)  CHIEF COMPLAINTS/PURPOSE OF CONSULTATION:  Bladder cancer  #  Oncology History Overview Note  # BLADDER CANCER: pT2/cpT4  prostate/bladder neck Dr.Brandon TURP s/p  - INVASIVE UROTHELIAL CARCINOMA, HIGH-GRADE;  TUMOR INVADES BLADDER NECK MUSCULARIS PROPRIA. Mild-mod Right hydronephrosis [facility] RISK FEATURES]   # OCt 9t ddMVAC x4; Jan 24th cystectomy [Dr.Manny; [place]] ypTis ypN1; stage III suriveillaince  # Feb 2020- UTI/sepsis- ICU  x3 weeks IV antibiotics/ s-p port removal.   # 2 Echo- 50% to 55%. [oct 6010]   # COPD/ not on O2; [quit 2009];    DIAGNOSIS: BLADDER CA  STAGE: Stage III;GOALS: cure  CURRENT/MOST RECENT THERAPY : Surveillance    Cancer of bladder neck ([facility])     HISTORY OF PRESENTING ILLNESS:  Steve Gilbert 68 y.o.  male with muscle invasive bladder cancer status post neoadjuvant chemotherapy-followed by cystectomy is here for follow-up/review the results of the recent CT scan.  Patient has not had any further infectious episodes.  Status post antibiotics.  He denies any significant tingling or numbness.  Denies any abdominal pain nausea vomiting.  Mild fatigue.  Otherwise doing well.    Review of Systems  Constitutional: Positive for malaise/fatigue. Negative for chills, diaphoresis, fever and weight loss.  HENT: Negative for nosebleeds and sore throat.   Eyes: Negative for double vision.  Cardiovascular: Negative for chest pain, palpitations, orthopnea and leg swelling.  Gastrointestinal: Negative for abdominal pain, blood in stool, constipation, diarrhea, heartburn, melena, nausea and vomiting.  Genitourinary: Negative for dysuria, frequency and urgency.  Musculoskeletal: Negative for back pain and joint  pain.  Neurological: Negative for dizziness, tingling, focal weakness, weakness and headaches.  Endo/Heme/Allergies: Does not bruise/bleed easily.  Psychiatric/Behavioral: Negative for depression. The patient is not nervous/anxious and does not have insomnia.      MEDICAL HISTORY:  Past Medical History:  Diagnosis Date  . Acid reflux   . Allergy   . Cancer of bladder neck ([place]) 02/2018   Chemo tx's and surgical resection.  Marland Kitchen COPD (chronic obstructive pulmonary disease) ([place])   . High cholesterol   . Hyperlipidemia 04/03/2018  . Hypertension   . Pneumonia     SURGICAL HISTORY: Past Surgical History:  Procedure Laterality Date  . CYSTOSCOPY W/ URETERAL STENT REMOVAL Right 08/10/2018   Procedure: CYSTOSCOPY WITH STENT REMOVAL;  Surgeon: Alexis Frock, MD;  Location: [facility];  Service: Urology;  Laterality: Right;  . CYSTOSCOPY WITH BIOPSY N/A 04/04/2018   Procedure: CYSTOSCOPY WITH TURBT;  Surgeon: Hollice Espy, MD;  Location: [facility] ORS;  Service: Urology;  Laterality: N/A;  . CYSTOSCOPY WITH URETEROSCOPY AND STENT PLACEMENT Right 04/04/2018   Procedure: CYSTOSCOPY WITH URETEROSCOPY AND STENT PLACEMENT;  Surgeon: Hollice Espy, MD;  Location: [facility] ORS;  Service: Urology;  Laterality: Right;  . NASAL SINUS SURGERY    . PORTA CATH INSERTION N/A 04/23/2018   Procedure: PORTA CATH INSERTION;  Surgeon: Algernon Huxley, MD;  Location: [facility] CV LAB;  Service: Cardiovascular;  Laterality: N/A;  . PORTA CATH REMOVAL N/A 09/04/2018   Procedure: PORTA CATH REMOVAL;  Surgeon: Katha Cabal, MD;  Location: [place] CV LAB;  Service: Cardiovascular;  Laterality: N/A;  . TEE WITHOUT CARDIOVERSION N/A 09/05/2018   Procedure: TRANSESOPHAGEAL ECHOCARDIOGRAM (TEE);  Surgeon: Minna Merritts, MD;  Location: [facility]  ORS;  Service: Cardiovascular;  Laterality: N/A;  . TRANSURETHRAL RESECTION OF PROSTATE N/A 04/04/2018   Procedure: TRANSURETHRAL RESECTION OF THE PROSTATE (TURP);  Surgeon:  Hollice Espy, MD;  Location: [facility] ORS;  Service: Urology;  Laterality: N/A;  . URETERAL BIOPSY Right 04/04/2018   Procedure: URETERAL BIOPSY;  Surgeon: Hollice Espy, MD;  Location: [facility] ORS;  Service: Urology;  Laterality: Right;    SOCIAL HISTORY: pt lives with his wife; currently retired.  Works part-time.  Remote history of smoking.  No alcohol. Social History   Socioeconomic History  . Marital status: Married    Spouse name: Not on file  . Number of children: Not on file  . Years of education: Not on file  . Highest education level: Not on file  Occupational History  . Not on file  Social Needs  . Financial resource strain: Not hard at all  . Food insecurity    Worry: Patient refused    Inability: Patient refused  . Transportation needs    Medical: Patient refused    Non-medical: Patient refused  Tobacco Use  . Smoking status: Former Smoker    Packs/day: 1.50    Years: 30.00    Pack years: 45.00    Types: Cigarettes    Quit date: 2009    Years since quitting: 11.4  . Smokeless tobacco: Former Network engineer and Sexual Activity  . Alcohol use: Yes    Alcohol/week: 7.0 standard drinks    Types: 5 Cans of beer, 2 Standard drinks or equivalent per week    Comment: ocassional   . Drug use: Not Currently  . Sexual activity: Not Currently  Lifestyle  . Physical activity    Days per week: Patient refused    Minutes per session: Patient refused  . Stress: Not at all  Relationships  . Social Herbalist on phone: Patient refused    Gets together: Patient refused    Attends religious service: Patient refused    Active member of club or organization: Patient refused    Attends meetings of clubs or organizations: Patient refused    Relationship status: Patient refused  . Intimate partner violence    Fear of current or ex partner: Patient refused    Emotionally abused: Patient refused    Physically abused: Patient refused    Forced sexual activity:  Patient refused  Other Topics Concern  . Not on file  Social History Narrative  . Not on file    FAMILY HISTORY: Family History  Problem Relation Age of Onset  . Diabetes Mother   . Heart disease Father   . Diabetes Brother   . Heart attack Brother   . Heart disease Brother     ALLERGIES:  is allergic to contrast media [iodinated diagnostic agents].  MEDICATIONS:  Current Outpatient Medications  Medication Sig Dispense Refill  . amLODipine (NORVASC) 10 MG tablet Take 10 mg by mouth daily.    Marland Kitchen aspirin EC 81 MG tablet Take 81 mg by mouth daily.    Marland Kitchen atorvastatin (LIPITOR) 20 MG tablet Take 20 mg by mouth daily.    Marland Kitchen azelastine (ASTELIN) 0.1 % nasal spray Place 1 spray into both nostrils 2 (two) times daily as needed (congestion). Use in each nostril as directed     . Fluticasone-Salmeterol (ADVAIR) 250-50 MCG/DOSE AEPB Inhale 1 puff into the lungs 2 (two) times daily as needed (shortness of breath).     . hydrochlorothiazide (HYDRODIURIL) 12.5 MG tablet Take 12.5  mg by mouth daily.   9  . metoprolol tartrate (LOPRESSOR) 25 MG tablet TAKE 1 TABLET(25 MG) BY MOUTH TWICE DAILY 60 tablet 2  . pantoprazole (PROTONIX) 40 MG tablet Take 40 mg by mouth every evening.     . tiotropium (SPIRIVA) 18 MCG inhalation capsule Place 18 mcg into inhaler and inhale daily as needed (congestion).     Marland Kitchen acidophilus (RISAQUAD) CAPS capsule Take 1 capsule by mouth daily. (Patient not taking: Reported on 09/21/2018) 30 capsule 0  . albuterol (PROVENTIL HFA;VENTOLIN HFA) 108 (90 Base) MCG/ACT inhaler Inhale 2 puffs into the lungs every 6 (six) hours as needed for wheezing or shortness of breath. 1 Inhaler 2  . diphenhydrAMINE (BENADRYL) 25 MG tablet Take 25 mg by mouth daily as needed for allergies.    Marland Kitchen docusate sodium (COLACE) 100 MG capsule TAKE 1 CAPSULE BY MOUTH TWICE A DAY (Patient not taking: Reported on 09/20/2018) 60 capsule 0  . Multiple Vitamins-Minerals (MULTIVITAMIN WITH MINERALS) tablet Take 1  tablet by mouth daily.    . potassium chloride SA (KLOR-CON M20) 20 MEQ tablet Take 1 tablet (20 mEq total) by mouth daily. 14 tablet 0   No current facility-administered medications for this visit.       Marland Kitchen  PHYSICAL EXAMINATION: ECOG PERFORMANCE STATUS: 1 - Symptomatic but completely ambulatory  Vitals:   01/02/19 1038  BP: 129/80  Pulse: 66  Resp: 16  Temp: (!) 97.1 F (36.2 C)   Filed Weights   01/02/19 1038  Weight: 211 lb (95.7 kg)    Physical Exam  Constitutional: He is oriented to person, place, and time and well-developed, well-nourished, and in no distress.  He is alone.  Walking himself.  HENT:  Head: Normocephalic and atraumatic.  Mouth/Throat: Oropharynx is clear and moist. No oropharyngeal exudate.  Eyes: Pupils are equal, round, and reactive to light.  Neck: Normal range of motion. Neck supple.  Cardiovascular: Normal rate and regular rhythm.  Pulmonary/Chest: Breath sounds normal. No respiratory distress.  Abdominal: Soft. Bowel sounds are normal. He exhibits no distension and no mass. There is no abdominal tenderness. There is no rebound and no guarding.  Urostomy.   Musculoskeletal: Normal range of motion.        General: No tenderness or edema.  Neurological: He is alert and oriented to person, place, and time.  Skin: Skin is warm.  Psychiatric: Affect normal.     LABORATORY DATA:  I have reviewed the data as listed Lab Results  Component Value Date   WBC 5.5 01/02/2019   HGB 14.1 01/02/2019   HCT 43.0 01/02/2019   MCV 86.9 01/02/2019   PLT 225 01/02/2019   Recent Labs    08/09/18 1401  09/02/18 2042  09/06/18 0501 09/21/18 0951 01/02/19 1009  NA 139   < > 139   < > 140 139 140  K 4.0   < > 3.1*   < > 3.5 4.2 4.3  CL 103   < > 100   < > 109 106 101  CO2 29   < > 27   < > 25 28 29   GLUCOSE 124*   < > 124*   < > 101* 138* 149*  BUN 20   < > 35*   < > 25* 18 20  CREATININE 1.19   < > 2.56*   < > 1.39* 1.29* 1.46*  CALCIUM 9.3   < >  8.2*   < > 8.3* 9.2 9.3  GFRNONAA >60   < >  25*   < > 52* 57* 49*  GFRAA >60   < > 29*   < > >60 >60 57*  PROT 6.6  --  6.6  --   --   --  7.3  ALBUMIN 4.2  --  3.5  --   --   --  4.1  AST 23  --  39  --   --   --  22  ALT 26  --  22  --   --   --  22  ALKPHOS 63  --  64  --   --   --  79  BILITOT 1.3*  --  1.7*  --   --   --  0.7   < > = values in this interval not displayed.    RADIOGRAPHIC STUDIES: I have personally reviewed the radiological images as listed and agreed with the findings in the report. No results found.  ASSESSMENT & PLAN:   Cancer of bladder neck (HCC) #Transitional cell bladder cancer neo-adjuvant chemo- Currently s/p 4 cycles of dose dense MVAC; status post cystectomy.  Clinically stable.  #June 2020-CT scan abdomen pelvis negative for any recurrence.  Continue surveillance imaging every 6 months.  Patient encouraged to follow-up with urology also.  # Chronic kidney disease-stage III stable.  #Hypertension-clinically stable; continue antihypertensives.  # DISPOSITION: # follow up in mid-dec 2020; MD- CT C/A/P 1-2 days prior; labs-cbc/cmp-Dr.B  Cc; Dr.manny.   All questions were answered. The patient knows to call the clinic with any problems, questions or concerns.     Cammie Sickle, MD 01/02/2019 5:08 PM

## 2019-01-02 NOTE — Assessment & Plan Note (Addendum)
#  Transitional cell bladder cancer neo-adjuvant chemo- Currently s/p 4 cycles of dose dense MVAC; status post cystectomy.  Clinically stable.  #June 2020-CT scan abdomen pelvis negative for any recurrence.  Continue surveillance imaging every 6 months.  Patient encouraged to follow-up with urology also.  # Chronic kidney disease-stage III stable.  #Hypertension-clinically stable; continue antihypertensives.  # DISPOSITION: # follow up in mid-dec 2020; MD- CT C/A/P 1-2 days prior; labs-cbc/cmp-Dr.B  Cc; Dr.manny.

## 2019-01-24 DIAGNOSIS — Z936 Other artificial openings of urinary tract status: Secondary | ICD-10-CM | POA: Diagnosis not present

## 2019-01-24 DIAGNOSIS — C775 Secondary and unspecified malignant neoplasm of intrapelvic lymph nodes: Secondary | ICD-10-CM | POA: Diagnosis not present

## 2019-01-24 DIAGNOSIS — A4901 Methicillin susceptible Staphylococcus aureus infection, unspecified site: Secondary | ICD-10-CM | POA: Diagnosis not present

## 2019-01-24 DIAGNOSIS — R7881 Bacteremia: Secondary | ICD-10-CM | POA: Diagnosis not present

## 2019-01-24 DIAGNOSIS — C678 Malignant neoplasm of overlapping sites of bladder: Secondary | ICD-10-CM | POA: Diagnosis not present

## 2019-01-24 DIAGNOSIS — N130 Hydronephrosis with ureteropelvic junction obstruction: Secondary | ICD-10-CM | POA: Diagnosis not present

## 2019-03-12 DIAGNOSIS — Z23 Encounter for immunization: Secondary | ICD-10-CM | POA: Diagnosis not present

## 2019-03-12 DIAGNOSIS — J449 Chronic obstructive pulmonary disease, unspecified: Secondary | ICD-10-CM | POA: Diagnosis not present

## 2019-03-12 DIAGNOSIS — Z0000 Encounter for general adult medical examination without abnormal findings: Secondary | ICD-10-CM | POA: Diagnosis not present

## 2019-03-12 DIAGNOSIS — Z933 Colostomy status: Secondary | ICD-10-CM | POA: Insufficient documentation

## 2019-03-12 DIAGNOSIS — Z939 Artificial opening status, unspecified: Secondary | ICD-10-CM | POA: Diagnosis not present

## 2019-03-12 DIAGNOSIS — E7800 Pure hypercholesterolemia, unspecified: Secondary | ICD-10-CM | POA: Diagnosis not present

## 2019-03-12 DIAGNOSIS — I10 Essential (primary) hypertension: Secondary | ICD-10-CM | POA: Diagnosis not present

## 2019-03-12 DIAGNOSIS — K219 Gastro-esophageal reflux disease without esophagitis: Secondary | ICD-10-CM | POA: Diagnosis not present

## 2019-03-14 ENCOUNTER — Other Ambulatory Visit: Payer: Self-pay | Admitting: Internal Medicine

## 2019-04-01 DIAGNOSIS — C775 Secondary and unspecified malignant neoplasm of intrapelvic lymph nodes: Secondary | ICD-10-CM | POA: Diagnosis not present

## 2019-04-01 DIAGNOSIS — C678 Malignant neoplasm of overlapping sites of bladder: Secondary | ICD-10-CM | POA: Diagnosis not present

## 2019-04-01 DIAGNOSIS — Z936 Other artificial openings of urinary tract status: Secondary | ICD-10-CM | POA: Diagnosis not present

## 2019-04-02 DIAGNOSIS — C678 Malignant neoplasm of overlapping sites of bladder: Secondary | ICD-10-CM | POA: Diagnosis not present

## 2019-04-02 DIAGNOSIS — R7881 Bacteremia: Secondary | ICD-10-CM | POA: Diagnosis not present

## 2019-04-02 DIAGNOSIS — A4901 Methicillin susceptible Staphylococcus aureus infection, unspecified site: Secondary | ICD-10-CM | POA: Diagnosis not present

## 2019-04-02 DIAGNOSIS — N130 Hydronephrosis with ureteropelvic junction obstruction: Secondary | ICD-10-CM | POA: Diagnosis not present

## 2019-04-02 DIAGNOSIS — C775 Secondary and unspecified malignant neoplasm of intrapelvic lymph nodes: Secondary | ICD-10-CM | POA: Diagnosis not present

## 2019-04-02 DIAGNOSIS — Z936 Other artificial openings of urinary tract status: Secondary | ICD-10-CM | POA: Diagnosis not present

## 2019-05-06 DIAGNOSIS — C775 Secondary and unspecified malignant neoplasm of intrapelvic lymph nodes: Secondary | ICD-10-CM | POA: Diagnosis not present

## 2019-05-06 DIAGNOSIS — R7881 Bacteremia: Secondary | ICD-10-CM | POA: Diagnosis not present

## 2019-05-06 DIAGNOSIS — N130 Hydronephrosis with ureteropelvic junction obstruction: Secondary | ICD-10-CM | POA: Diagnosis not present

## 2019-05-06 DIAGNOSIS — A4901 Methicillin susceptible Staphylococcus aureus infection, unspecified site: Secondary | ICD-10-CM | POA: Diagnosis not present

## 2019-05-06 DIAGNOSIS — Z936 Other artificial openings of urinary tract status: Secondary | ICD-10-CM | POA: Diagnosis not present

## 2019-05-06 DIAGNOSIS — C678 Malignant neoplasm of overlapping sites of bladder: Secondary | ICD-10-CM | POA: Diagnosis not present

## 2019-05-19 IMAGING — CT CT ABDOMEN AND PELVIS WITHOUT CONTRAST
2 of 4 series · 16 of 46 positions shown, 18 images · non-contrast
Comparison: 09/02/2018 CT abdomen/pelvis.

CLINICAL DATA: Bladder cancer status post neoadjuvant chemotherapy
and cystoprostatectomy. Restaging.

EXAM:
CT ABDOMEN AND PELVIS WITHOUT CONTRAST
TECHNIQUE: Multidetector CT imaging of the abdomen and pelvis was performed
following the standard protocol without IV contrast.

[Series 2: routine abdomen pelvis without · axial · non-contrast · 0.79mm/px · z∈[-1558,-1138]mm · 13 of 92 slices shown, 15 images (1 of 2)]
[im 4/92  soft-tissue]
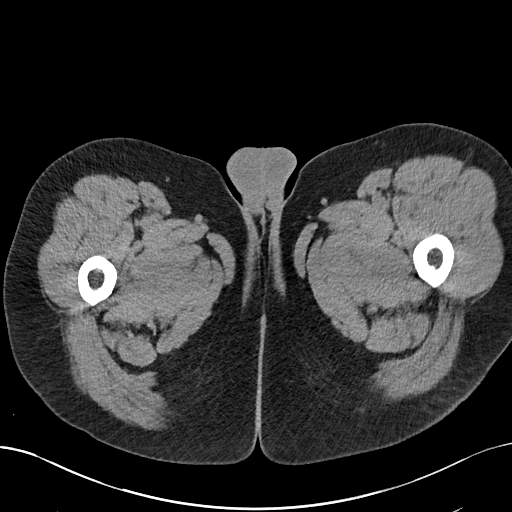
[im 4/92  bone]
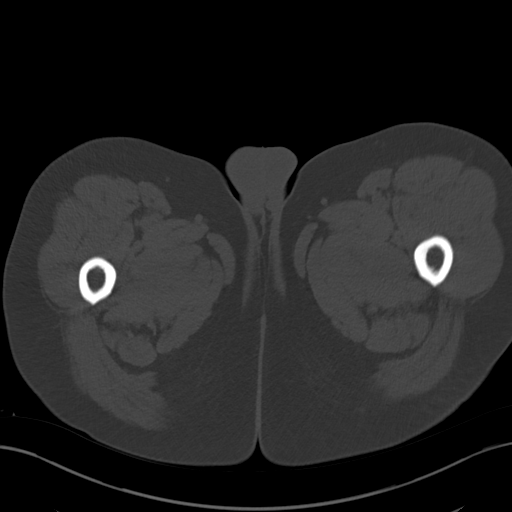
[im 12/92  soft-tissue]
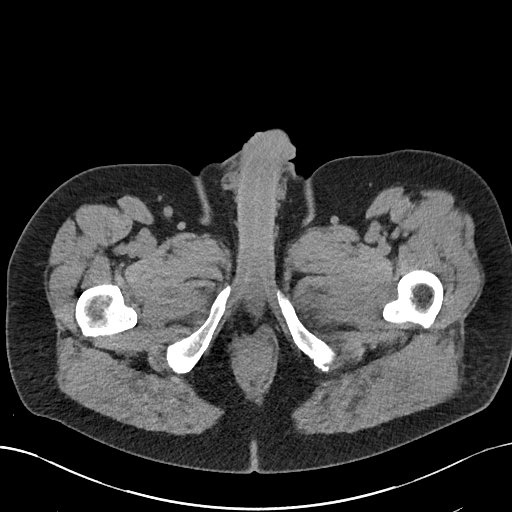
[im 19/92  soft-tissue]
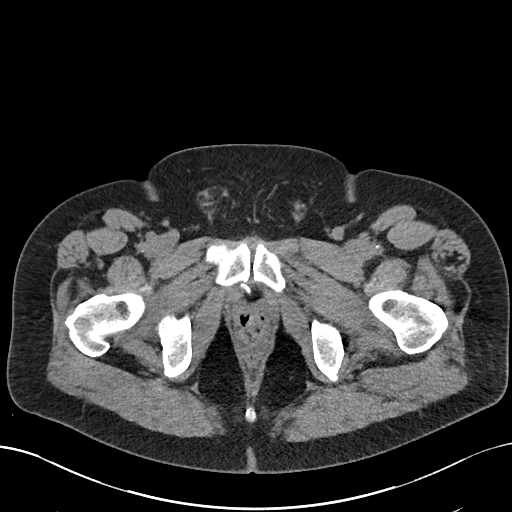
[im 27/92  soft-tissue]
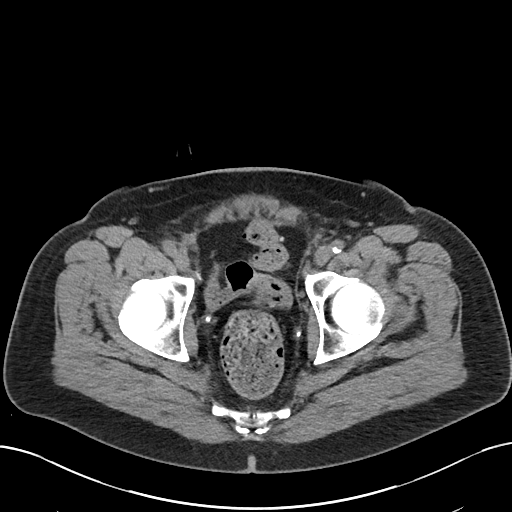
[im 31/92  soft-tissue]
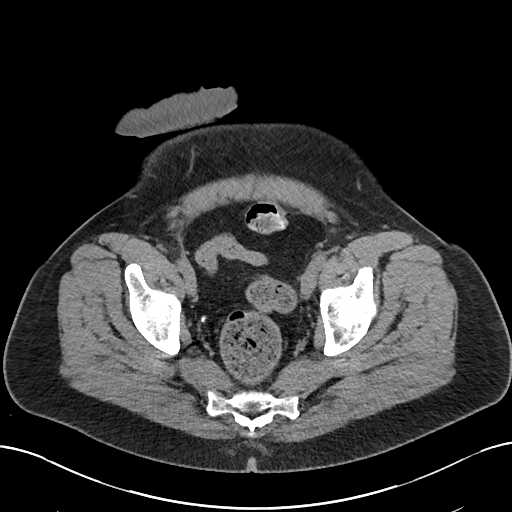
[im 38/92  soft-tissue]
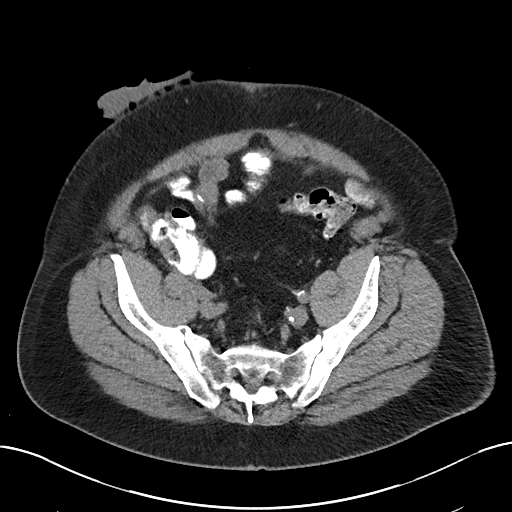
[im 46/92  soft-tissue]
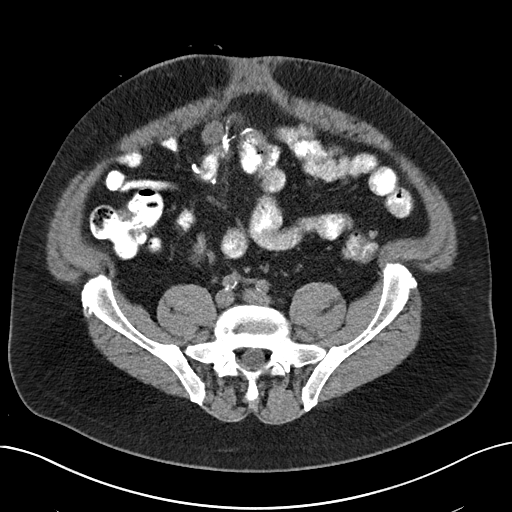
[im 54/92  soft-tissue]
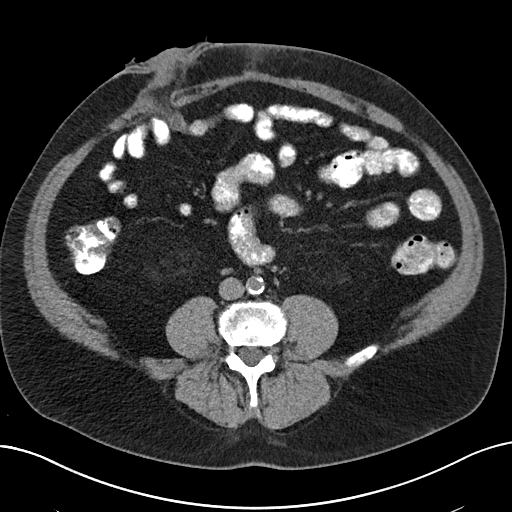
[im 61/92  soft-tissue]
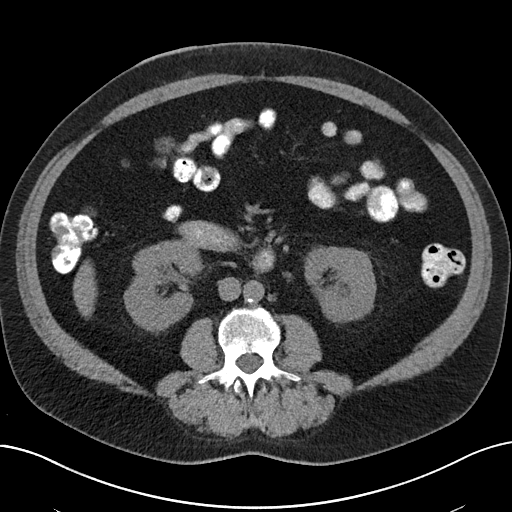
[im 61/92  bone]
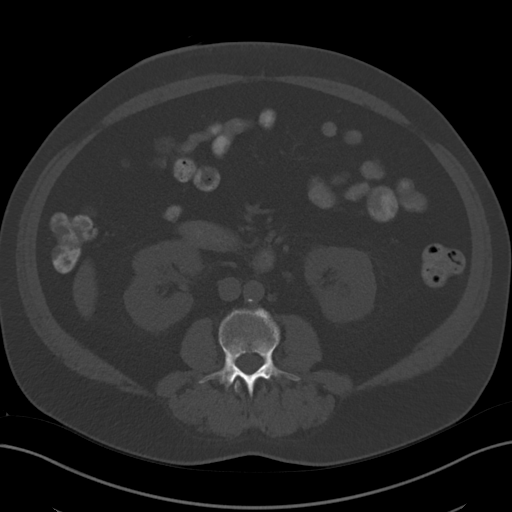
[im 65/92  soft-tissue]
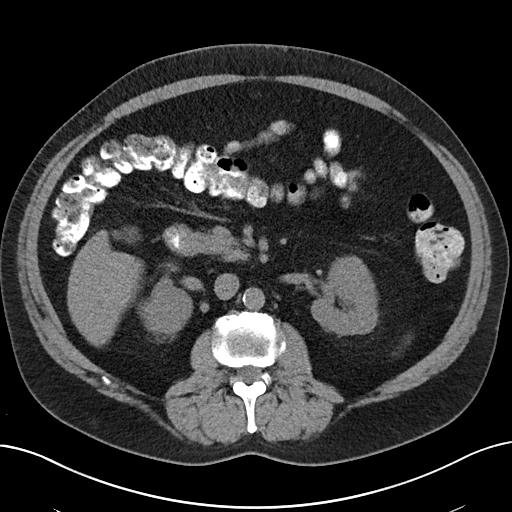
[im 73/92  soft-tissue]
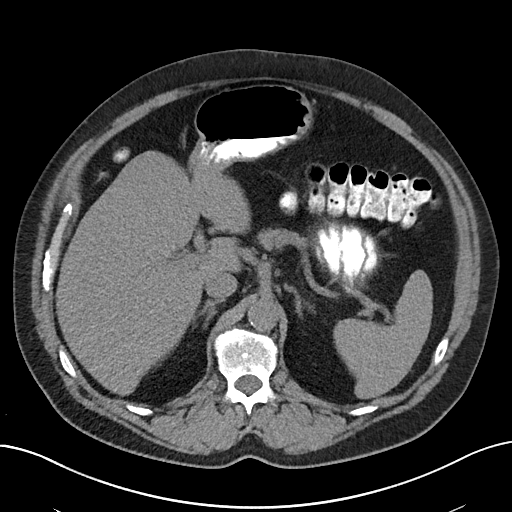
[im 80/92  soft-tissue]
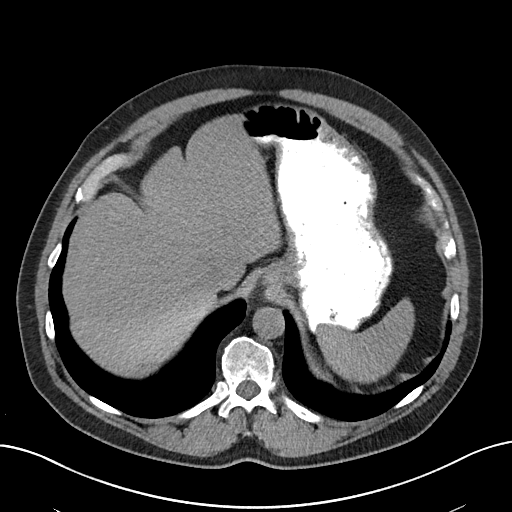
[im 88/92  soft-tissue]
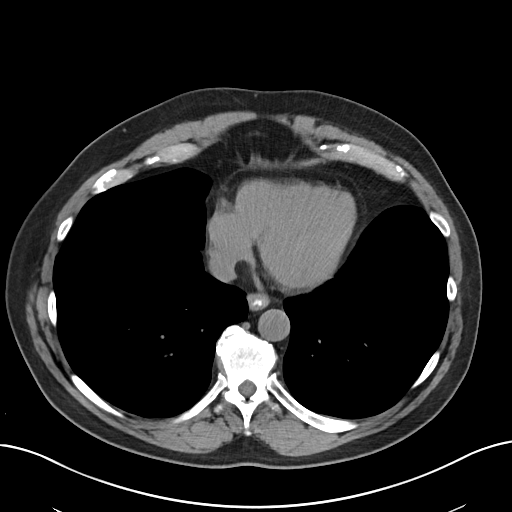

[Series 4: routine abdomen pelvis without · coronal · non-contrast · 0.79mm/px · 3 of 175 slices shown (2 of 2)]
[im 59/175  soft-tissue]
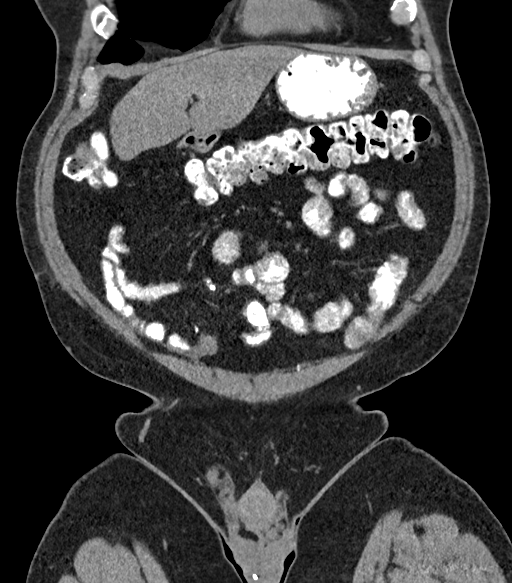
[im 78/175  soft-tissue]
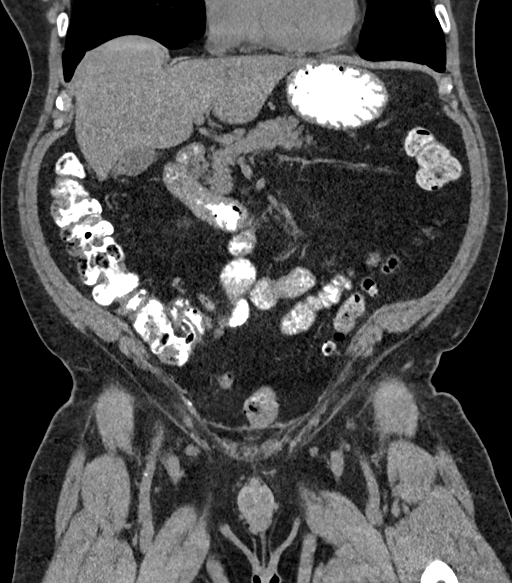
[im 97/175  soft-tissue]
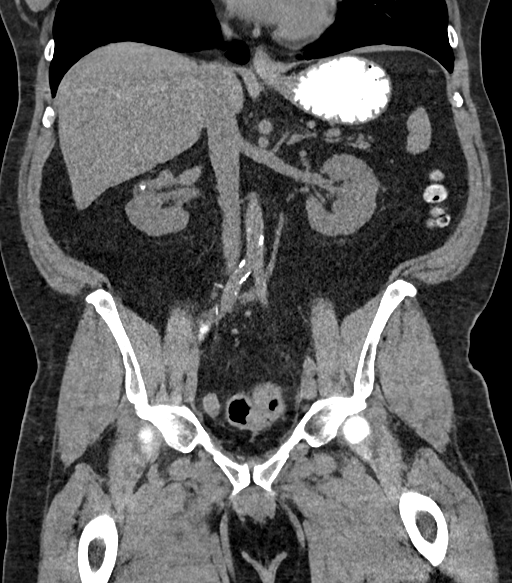

[16 of 46 positions shown; findings below may reference images not displayed]

FINDINGS: Lower chest: No significant pulmonary nodules or acute consolidative
airspace disease.

Hepatobiliary: Normal liver size. Few scattered subcentimeter
hypodense liver lesions are too small to characterize and are
unchanged back to 07/06/2018 CT, probably benign. No appreciable new
liver lesions. Normal gallbladder with no radiopaque cholelithiasis.
No biliary ductal dilatation.

Pancreas: Normal, with no mass or duct dilation.

Spleen: Normal size. No mass.

Adrenals/Urinary Tract: Normal adrenals. No hydronephrosis. No renal
stones. Stable mild renal cortical scarring with cortical
calcification in the upper right kidney. No contour deforming renal
masses. Status post cystectomy with ileal conduit urinary diversion.
No mass or fluid collection in the cystectomy bed. Ureters are
normal caliber. No ureteral stones. Ileal conduit is collapsed and
appears normal.

Stomach/Bowel: Normal non-distended stomach. No unexpected small
bowel dilatation. No small bowel wall thickening. Oral contrast
transits to distal colon. Normal appendix. Mild left colonic
diverticulosis, with no large bowel wall thickening or acute
pericolonic fat stranding.

Vascular/Lymphatic: Atherosclerotic nonaneurysmal abdominal aorta.
No pathologically enlarged lymph nodes in the abdomen or pelvis.

Reproductive: Status post prostatectomy, with no mass or fluid
collection in the prostatectomy bed.

Other: No pneumoperitoneum, ascites or focal fluid collection. No
discrete peritoneal nodularity.

Musculoskeletal: No aggressive appearing focal osseous lesions.
Moderate thoracolumbar spondylosis.
IMPRESSION: 1. Satisfactory appearance status post cystoprostatectomy with ileal
conduit urinary diversion, with no evidence of local tumor
recurrence in cystoprostatectomy bed. Resolved hydronephrosis.
2. No evidence of metastatic disease in the abdomen or pelvis on
this noncontrast scan.
3.  Aortic Atherosclerosis ([id number]).

## 2019-06-20 DIAGNOSIS — R7881 Bacteremia: Secondary | ICD-10-CM | POA: Diagnosis not present

## 2019-06-20 DIAGNOSIS — C678 Malignant neoplasm of overlapping sites of bladder: Secondary | ICD-10-CM | POA: Diagnosis not present

## 2019-06-20 DIAGNOSIS — C775 Secondary and unspecified malignant neoplasm of intrapelvic lymph nodes: Secondary | ICD-10-CM | POA: Diagnosis not present

## 2019-06-20 DIAGNOSIS — N130 Hydronephrosis with ureteropelvic junction obstruction: Secondary | ICD-10-CM | POA: Diagnosis not present

## 2019-06-20 DIAGNOSIS — Z936 Other artificial openings of urinary tract status: Secondary | ICD-10-CM | POA: Diagnosis not present

## 2019-06-20 DIAGNOSIS — A4901 Methicillin susceptible Staphylococcus aureus infection, unspecified site: Secondary | ICD-10-CM | POA: Diagnosis not present

## 2019-07-04 ENCOUNTER — Other Ambulatory Visit: Payer: Self-pay

## 2019-07-04 ENCOUNTER — Ambulatory Visit
Admission: RE | Admit: 2019-07-04 | Discharge: 2019-07-04 | Disposition: A | Discharge status: Home / Self Care | Payer: PPO | Source: Ambulatory Visit | Attending: Internal Medicine | Admitting: Internal Medicine

## 2019-07-04 DIAGNOSIS — J439 Emphysema, unspecified: Secondary | ICD-10-CM | POA: Diagnosis not present

## 2019-07-04 DIAGNOSIS — C675 Malignant neoplasm of bladder neck: Secondary | ICD-10-CM

## 2019-07-04 DIAGNOSIS — C679 Malignant neoplasm of bladder, unspecified: Secondary | ICD-10-CM | POA: Diagnosis not present

## 2019-07-05 ENCOUNTER — Inpatient Hospital Stay: Payer: PPO | Attending: Internal Medicine

## 2019-07-05 ENCOUNTER — Other Ambulatory Visit: Payer: Self-pay

## 2019-07-05 ENCOUNTER — Inpatient Hospital Stay (HOSPITAL_BASED_OUTPATIENT_CLINIC_OR_DEPARTMENT_OTHER): Payer: PPO | Admitting: Internal Medicine

## 2019-07-05 VITALS — BP 141/83 | HR 60 | Temp 97.6°F | Resp 18 | Wt 215.8 lb

## 2019-07-05 DIAGNOSIS — Z9221 Personal history of antineoplastic chemotherapy: Secondary | ICD-10-CM | POA: Insufficient documentation

## 2019-07-05 DIAGNOSIS — N182 Chronic kidney disease, stage 2 (mild): Secondary | ICD-10-CM | POA: Insufficient documentation

## 2019-07-05 DIAGNOSIS — Z79899 Other long term (current) drug therapy: Secondary | ICD-10-CM | POA: Insufficient documentation

## 2019-07-05 DIAGNOSIS — C675 Malignant neoplasm of bladder neck: Secondary | ICD-10-CM

## 2019-07-05 DIAGNOSIS — I129 Hypertensive chronic kidney disease with stage 1 through stage 4 chronic kidney disease, or unspecified chronic kidney disease: Secondary | ICD-10-CM | POA: Insufficient documentation

## 2019-07-05 LAB — COMPREHENSIVE METABOLIC PANEL
ALT: 27 U/L (ref 0–44)
AST: 26 U/L (ref 15–41)
Albumin: 4 g/dL (ref 3.5–5.0)
Alkaline Phosphatase: 77 U/L (ref 38–126)
Anion gap: 9 (ref 5–15)
BUN: 17 mg/dL (ref 8–23)
CO2: 26 mmol/L (ref 22–32)
Calcium: 8.9 mg/dL (ref 8.9–10.3)
Chloride: 103 mmol/L (ref 98–111)
Creatinine, Ser: 1.24 mg/dL (ref 0.61–1.24)
GFR calc Af Amer: >60 mL/min (ref >60)
GFR calc non Af Amer: 59 mL/min — ABNORMAL LOW (ref >60)
Glucose, Bld: 126 mg/dL — ABNORMAL HIGH (ref 70–99)
Potassium: 3.7 mmol/L (ref 3.5–5.1)
Sodium: 138 mmol/L (ref 135–145)
Total Bilirubin: 0.9 mg/dL (ref 0.3–1.2)
Total Protein: 6.8 g/dL (ref 6.5–8.1)

## 2019-07-05 LAB — CBC WITH DIFFERENTIAL/PLATELET
Abs Immature Granulocytes: 0.02 10*3/uL (ref 0.00–0.07)
Basophils Absolute: 0.1 10*3/uL (ref 0.0–0.1)
Basophils Relative: 1 %
Eosinophils Absolute: 0.3 10*3/uL (ref 0.0–0.5)
Eosinophils Relative: 5 %
HCT: 46.2 % (ref 39.0–52.0)
Hemoglobin: 15.2 g/dL (ref 13.0–17.0)
Immature Granulocytes: 0 %
Lymphocytes Relative: 20 %
Lymphs Abs: 1.1 10*3/uL (ref 0.7–4.0)
MCH: 29.6 pg (ref 26.0–34.0)
MCHC: 32.9 g/dL (ref 30.0–36.0)
MCV: 89.9 fL (ref 80.0–100.0)
Monocytes Absolute: 0.7 10*3/uL (ref 0.1–1.0)
Monocytes Relative: 12 %
Neutro Abs: 3.4 10*3/uL (ref 1.7–7.7)
Neutrophils Relative %: 62 %
Platelets: 234 10*3/uL (ref 150–400)
RBC: 5.14 MIL/uL (ref 4.22–5.81)
RDW: 12.6 % (ref 11.5–15.5)
WBC: 5.5 10*3/uL (ref 4.0–10.5)
nRBC: 0 % (ref 0.0–0.2)

## 2019-07-05 MED ORDER — MONTELUKAST SODIUM 10 MG PO TABS: ORAL_TABLET | ORAL | 0 refills | Status: DC

## 2019-07-05 MED ORDER — PREDNISONE 50 MG PO TABS: ORAL_TABLET | ORAL | 0 refills | Status: DC

## 2019-07-05 NOTE — Progress Notes (Signed)
[facility] NOTE  Patient Care Team: Kirk Ruths, MD as PCP - General (Internal Medicine) Hollice Espy, MD as Consulting Physician (Urology) Cammie Sickle, MD as Consulting Physician (Internal Medicine)  CHIEF COMPLAINTS/PURPOSE OF CONSULTATION:  Bladder cancer  #  Oncology History Overview Note  # BLADDER CANCER: pT2/cpT4  prostate/bladder neck Dr.Brandon TURP s/p  - INVASIVE UROTHELIAL CARCINOMA, HIGH-GRADE;  TUMOR INVADES BLADDER NECK MUSCULARIS PROPRIA. Mild-mod Right hydronephrosis [facility] RISK FEATURES]   # OCt 9t ddMVAC x4; Jan 24th cystectomy [Dr.Manny; [place]] ypTis ypN1; stage III suriveillaince  # Feb 2020- UTI/sepsis- ICU  x3 weeks IV antibiotics/ s-p port removal.   # 2 Echo- 50% to 55%. [oct [id number]   # COPD/ not on O2; [quit 2009];    DIAGNOSIS: BLADDER CA  STAGE: Stage III;GOALS: cure  CURRENT/MOST RECENT THERAPY : Surveillance    Cancer of bladder neck (HCC)     HISTORY OF PRESENTING ILLNESS:  Steve Gilbert 68 y.o.  male with muscle invasive bladder cancer status post neoadjuvant chemotherapy-followed by cystectomy is here for follow-up/review the results of the recent CT scan.  In the interim patient has not had any hospitalizations.  Patient denies any bone pain.  Denies any unusual shortness of breath or cough.  No fatigue.  Denies any significant tingling or numbness.  No abdominal pain.  No vomiting.   Review of Systems  Constitutional: Negative for chills, diaphoresis, fever and weight loss.  HENT: Negative for nosebleeds and sore throat.   Eyes: Negative for double vision.  Cardiovascular: Negative for chest pain, palpitations, orthopnea and leg swelling.  Gastrointestinal: Negative for abdominal pain, blood in stool, constipation, diarrhea, heartburn, melena, nausea and vomiting.  Genitourinary: Negative for dysuria, frequency and urgency.  Musculoskeletal: Negative for back pain and joint pain.   Neurological: Negative for dizziness, tingling, focal weakness, weakness and headaches.  Endo/Heme/Allergies: Does not bruise/bleed easily.  Psychiatric/Behavioral: Negative for depression. The patient is not nervous/anxious and does not have insomnia.      MEDICAL HISTORY:  Past Medical History:  Diagnosis Date  . Acid reflux   . Allergy   . Cancer of bladder neck ([place]) 02/2018   Chemo tx's and surgical resection.  Marland Kitchen COPD (chronic obstructive pulmonary disease) ([place])   . High cholesterol   . Hyperlipidemia 04/03/2018  . Hypertension   . Pneumonia     SURGICAL HISTORY: Past Surgical History:  Procedure Laterality Date  . CYSTOSCOPY W/ URETERAL STENT REMOVAL Right 08/10/2018   Procedure: CYSTOSCOPY WITH STENT REMOVAL;  Surgeon: Alexis Frock, MD;  Location: [facility];  Service: Urology;  Laterality: Right;  . CYSTOSCOPY WITH BIOPSY N/A 04/04/2018   Procedure: CYSTOSCOPY WITH TURBT;  Surgeon: Hollice Espy, MD;  Location: [facility];  Service: Urology;  Laterality: N/A;  . CYSTOSCOPY WITH URETEROSCOPY AND STENT PLACEMENT Right 04/04/2018   Procedure: CYSTOSCOPY WITH URETEROSCOPY AND STENT PLACEMENT;  Surgeon: Hollice Espy, MD;  Location: [facility];  Service: Urology;  Laterality: Right;  . NASAL SINUS SURGERY    . PORTA CATH INSERTION N/A 04/23/2018   Procedure: PORTA CATH INSERTION;  Surgeon: Algernon Huxley, MD;  Location: [facility];  Service: Cardiovascular;  Laterality: N/A;  . PORTA CATH REMOVAL N/A 09/04/2018   Procedure: PORTA CATH REMOVAL;  Surgeon: Katha Cabal, MD;  Location: [facility];  Service: Cardiovascular;  Laterality: N/A;  . TEE WITHOUT CARDIOVERSION N/A 09/05/2018   Procedure: TRANSESOPHAGEAL ECHOCARDIOGRAM (TEE);  Surgeon: Minna Merritts, MD;  Location: [facility];  Service: Cardiovascular;  Laterality: N/A;  . TRANSURETHRAL RESECTION OF PROSTATE N/A 04/04/2018   Procedure: TRANSURETHRAL RESECTION OF THE PROSTATE (TURP);  Surgeon: Hollice Espy, MD;  Location: [facility];  Service: Urology;  Laterality: N/A;  . URETERAL BIOPSY Right 04/04/2018   Procedure: URETERAL BIOPSY;  Surgeon: Hollice Espy, MD;  Location: [facility];  Service: Urology;  Laterality: Right;    SOCIAL HISTORY: pt lives with his wife; currently retired.  Works part-time.  Remote history of smoking.  No alcohol. Social History   Socioeconomic History  . Marital status: Married    Spouse name: Not on file  . Number of children: Not on file  . Years of education: Not on file  . Highest education level: Not on file  Occupational History  . Not on file  Tobacco Use  . Smoking status: Former Smoker    Packs/day: 1.50    Years: 30.00    Pack years: 45.00    Types: Cigarettes    Quit date: 2009    Years since quitting: 11.9  . Smokeless tobacco: Former Network engineer and Sexual Activity  . Alcohol use: Yes    Alcohol/week: 7.0 standard drinks    Types: 5 Cans of beer, 2 Standard drinks or equivalent per week    Comment: ocassional   . Drug use: Not Currently  . Sexual activity: Not Currently  Other Topics Concern  . Not on file  Social History Narrative  . Not on file   Social Determinants of Health   Financial Resource Strain: Low Risk   . Difficulty of Paying Living Expenses: Not hard at all  Food Insecurity: Unknown  . Worried About Charity fundraiser in the Last Year: Patient refused  . Ran Out of Food in the Last Year: Patient refused  Transportation Needs: Unknown  . Lack of Transportation (Medical): Patient refused  . Lack of Transportation (Non-Medical): Patient refused  Physical Activity: Unknown  . Days of Exercise per Week: Patient refused  . Minutes of Exercise per Session: Patient refused  Stress: No Stress Concern Present  . Feeling of Stress : Not at all  Social Connections: Unknown  . Frequency of Communication with Friends and Family: Patient refused  . Frequency of Social Gatherings with Friends and Family: Patient  refused  . Attends Religious Services: Patient refused  . Active Member of Clubs or Organizations: Patient refused  . Attends Archivist Meetings: Patient refused  . Marital Status: Patient refused  Intimate Partner Violence: Unknown  . Fear of Current or Ex-Partner: Patient refused  . Emotionally Abused: Patient refused  . Physically Abused: Patient refused  . Sexually Abused: Patient refused    FAMILY HISTORY: Family History  Problem Relation Age of Onset  . Diabetes Mother   . Heart disease Father   . Diabetes Brother   . Heart attack Brother   . Heart disease Brother     ALLERGIES:  is allergic to contrast media [iodinated diagnostic agents].  MEDICATIONS:  Current Outpatient Medications  Medication Sig Dispense Refill  . amLODipine (NORVASC) 10 MG tablet Take 10 mg by mouth daily.    Marland Kitchen aspirin EC 81 MG tablet Take 81 mg by mouth daily.    Marland Kitchen atorvastatin (LIPITOR) 20 MG tablet Take 20 mg by mouth daily.    Marland Kitchen azelastine (ASTELIN) 0.1 % nasal spray Place 1 spray into both nostrils 2 (two) times daily as needed (congestion). Use in each nostril as directed     .  Fluticasone-Salmeterol (ADVAIR) 250-50 MCG/DOSE AEPB Inhale 1 puff into the lungs 2 (two) times daily as needed (shortness of breath).     . hydrochlorothiazide (HYDRODIURIL) 12.5 MG tablet Take 12.5 mg by mouth daily.   9  . metoprolol tartrate (LOPRESSOR) 25 MG tablet TAKE 1 TABLET(25 MG) BY MOUTH TWICE DAILY 60 tablet 3  . pantoprazole (PROTONIX) 40 MG tablet Take 40 mg by mouth every evening.     . tiotropium (SPIRIVA) 18 MCG inhalation capsule Place 18 mcg into inhaler and inhale daily as needed (congestion).     . montelukast (SINGULAIR) 10 MG tablet Take 10 mg orally 24 hours and 1-2 hours prior to scan x 4 days total. 4 tablet 0  . Multiple Vitamins-Minerals (MULTIVITAMIN WITH MINERALS) tablet Take 1 tablet by mouth daily.    . predniSONE (DELTASONE) 50 MG tablet Take 1 pill 50 mg orally 24 hours and  1-2 hours prior to scan. 2 tablet 0   No current facility-administered medications for this visit.      Marland Kitchen  PHYSICAL EXAMINATION: ECOG PERFORMANCE STATUS: 1 - Symptomatic but completely ambulatory  Vitals:   07/05/19 1021  BP: (!) 141/83  Pulse: 60  Resp: 18  Temp: 97.6 F (36.4 C)   Filed Weights   07/05/19 1021  Weight: 215 lb 12.8 oz (97.9 kg)    Physical Exam  Constitutional: He is oriented to person, place, and time and well-developed, well-nourished, and in no distress.  He is alone.  Walking himself.  HENT:  Head: Normocephalic and atraumatic.  Mouth/Throat: Oropharynx is clear and moist. No oropharyngeal exudate.  Eyes: Pupils are equal, round, and reactive to light.  Cardiovascular: Normal rate and regular rhythm.  Pulmonary/Chest: Breath sounds normal. No respiratory distress.  Abdominal: Soft. Bowel sounds are normal. He exhibits no distension and no mass. There is no abdominal tenderness. There is no rebound and no guarding.  Urostomy.   Musculoskeletal:        General: No tenderness or edema. Normal range of motion.     Cervical back: Normal range of motion and neck supple.  Neurological: He is alert and oriented to person, place, and time.  Skin: Skin is warm.  Psychiatric: Affect normal.     LABORATORY DATA:  I have reviewed the data as listed Lab Results  Component Value Date   WBC 5.5 07/05/2019   HGB 15.2 07/05/2019   HCT 46.2 07/05/2019   MCV 89.9 07/05/2019   PLT 234 07/05/2019   Recent Labs    09/02/18 2042 09/21/18 0951 01/02/19 1009 07/05/19 1003  NA 139 139 140 138  K 3.1* 4.2 4.3 3.7  CL 100 106 101 103  CO2 27 28 29 26   GLUCOSE 124* 138* 149* 126*  BUN 35* 18 20 17   CREATININE 2.56* 1.29* 1.46* 1.24  CALCIUM 8.2* 9.2 9.3 8.9  GFRNONAA 25* 57* 49* 59*  GFRAA 29* >60 57* >60  PROT 6.6  --  7.3 6.8  ALBUMIN 3.5  --  4.1 4.0  AST 39  --  22 26  ALT 22  --  22 27  ALKPHOS 64  --  79 77  BILITOT 1.7*  --  0.7 0.9     RADIOGRAPHIC STUDIES: I have personally reviewed the radiological images as listed and agreed with the findings in the report. CT Abdomen Pelvis Wo Contrast  Result Date: 07/04/2019 CLINICAL DATA:  Restaging bladder cancer. EXAM: CT CHEST, ABDOMEN AND PELVIS WITHOUT CONTRAST TECHNIQUE: Multidetector CT imaging of  the chest, abdomen and pelvis was performed following the standard protocol without IV contrast. COMPARISON:  CT AP 11/30/2018 FINDINGS: CT CHEST FINDINGS Cardiovascular: Normal heart size. No pericardial effusion. Aortic atherosclerosis. RCA, LAD and left circumflex coronary artery calcifications. Mediastinum/Nodes: No enlarged mediastinal, hilar, or axillary lymph nodes. Thyroid gland, trachea, and esophagus demonstrate no significant findings. Lungs/Pleura: No pleural effusion identified.Centrilobular emphysema. No airspace consolidation, atelectasis, or pneumothorax identified. Scarring noted within the lingula. Musculoskeletal: No chest wall mass or suspicious bone lesions identified. CT ABDOMEN PELVIS FINDINGS Hepatobiliary: Several small, millimetric low-density foci within the liver are unchanged from previous exam. These are too small to reliably characterize. Gallbladder is within normal limits. No biliary dilatation. Pancreas: Unremarkable. No pancreatic ductal dilatation or surrounding inflammatory changes. Spleen: Normal in size without focal abnormality. Adrenals/Urinary Tract: Normal appearance of the adrenal glands. Cortical scarring and calcification within the lateral cortex of upper pole of right kidney is unchanged. No hydronephrosis identified bilaterally. Status post cystectomy with diverting loop ileostomy. Stomach/Bowel: Stomach is within normal limits. Appendix appears normal. No evidence of bowel wall thickening, distention, or inflammatory changes. Vascular/Lymphatic: Aortic atherosclerosis. No aneurysm. No abdominopelvic adenopathy identified. Reproductive:  Prostatectomy. Other: No abdominal wall hernia or abnormality. No abdominopelvic ascites. Musculoskeletal: No acute or significant osseous findings. IMPRESSION: 1. Stable CT of the chest, abdomen and pelvis. No findings to suggest residual or recurrent tumor or metastatic disease. 2. Emphysema and aortic atherosclerosis. Three vessel coronary artery calcifications noted. 3. Status post cystectomy with diverting loop ileostomy. Aortic Atherosclerosis (ICD10-I70.0) and Emphysema (ICD10-J43.9). Electronically Signed   By: Kerby Moors M.D.   On: 07/04/2019 11:02   CT CHEST WO CONTRAST  Result Date: 07/04/2019 CLINICAL DATA:  Restaging bladder cancer. EXAM: CT CHEST, ABDOMEN AND PELVIS WITHOUT CONTRAST TECHNIQUE: Multidetector CT imaging of the chest, abdomen and pelvis was performed following the standard protocol without IV contrast. COMPARISON:  CT AP 11/30/2018 FINDINGS: CT CHEST FINDINGS Cardiovascular: Normal heart size. No pericardial effusion. Aortic atherosclerosis. RCA, LAD and left circumflex coronary artery calcifications. Mediastinum/Nodes: No enlarged mediastinal, hilar, or axillary lymph nodes. Thyroid gland, trachea, and esophagus demonstrate no significant findings. Lungs/Pleura: No pleural effusion identified.Centrilobular emphysema. No airspace consolidation, atelectasis, or pneumothorax identified. Scarring noted within the lingula. Musculoskeletal: No chest wall mass or suspicious bone lesions identified. CT ABDOMEN PELVIS FINDINGS Hepatobiliary: Several small, millimetric low-density foci within the liver are unchanged from previous exam. These are too small to reliably characterize. Gallbladder is within normal limits. No biliary dilatation. Pancreas: Unremarkable. No pancreatic ductal dilatation or surrounding inflammatory changes. Spleen: Normal in size without focal abnormality. Adrenals/Urinary Tract: Normal appearance of the adrenal glands. Cortical scarring and calcification within  the lateral cortex of upper pole of right kidney is unchanged. No hydronephrosis identified bilaterally. Status post cystectomy with diverting loop ileostomy. Stomach/Bowel: Stomach is within normal limits. Appendix appears normal. No evidence of bowel wall thickening, distention, or inflammatory changes. Vascular/Lymphatic: Aortic atherosclerosis. No aneurysm. No abdominopelvic adenopathy identified. Reproductive: Prostatectomy. Other: No abdominal wall hernia or abnormality. No abdominopelvic ascites. Musculoskeletal: No acute or significant osseous findings. IMPRESSION: 1. Stable CT of the chest, abdomen and pelvis. No findings to suggest residual or recurrent tumor or metastatic disease. 2. Emphysema and aortic atherosclerosis. Three vessel coronary artery calcifications noted. 3. Status post cystectomy with diverting loop ileostomy. Aortic Atherosclerosis (ICD10-I70.0) and Emphysema (ICD10-J43.9). Electronically Signed   By: Kerby Moors M.D.   On: 07/04/2019 11:02    ASSESSMENT & PLAN:   Cancer of bladder neck ([place]) #  Transitional cell bladder cancer neo-adjuvant chemo- Currently s/p 4 cycles of dose dense MVAC; status post cystectomy. # DEC 2020-CT scan non- contrast- abdomen pelvis negative for any recurrence. STABLE.  # Continue surveillance imaging every 6 months; however I would recommend getting a CT scan with contrast at next visit [see discussion below; bite prep]  # Chronic kidney disease-stage II- STABLE.   #Hypertension-clinically stable;- 0000000 systolic-  continue antihypertensives.  # IV dye prep-counseled the patient regarding dye allergy/diaper.  Detailed instructions given.  Prescriptions printed.  Take the following pre-medications prior to your scan: # Prednisone 50 mg orally 24 hours and 1--2 hours prior to scan. [script sent] # Singulair (Generic: montelukast) 10 mg orally 24 hours and 1-2 hours prior to scan. [script sent] # Benadryl 1 pill 50 mg orally 1 hour prior to  scan. [over the counter] # Pepcid (Generic: famotidine) 20 mg orally 1 hour prior to scan. [over the counter] --------------------------  # DISPOSITION: # follow up in mid-June 2020; MD- CT A/P 1-2 days prior; labs-cbc/cmp-Dr.B  Cc; Dr.manny.   All questions were answered. The patient knows to call the clinic with any problems, questions or concerns.     Cammie Sickle, MD 07/05/2019 12:41 PM

## 2019-07-05 NOTE — Assessment & Plan Note (Addendum)
#  Transitional cell bladder cancer neo-adjuvant chemo- Currently s/p 4 cycles of dose dense MVAC; status post cystectomy. # DEC 2020-CT scan non- contrast- abdomen pelvis negative for any recurrence. STABLE.  # Continue surveillance imaging every 6 months; however I would recommend getting a CT scan with contrast at next visit [see discussion below; bite prep]  # Chronic kidney disease-stage II- STABLE.   #Hypertension-clinically stable;- 0000000 systolic-  continue antihypertensives.  # IV dye prep-counseled the patient regarding dye allergy/diaper.  Detailed instructions given.  Prescriptions printed.  Take the following pre-medications prior to your scan: # Prednisone 50 mg orally 24 hours and 1--2 hours prior to scan. [script sent] # Singulair (Generic: montelukast) 10 mg orally 24 hours and 1-2 hours prior to scan. [script sent] # Benadryl 1 pill 50 mg orally 1 hour prior to scan. [over the counter] # Pepcid (Generic: famotidine) 20 mg orally 1 hour prior to scan. [over the counter] --------------------------  # DISPOSITION: # follow up in mid-June 2020; MD- CT A/P 1-2 days prior; labs-cbc/cmp-Dr.B  Cc; Dr.manny.

## 2019-07-05 NOTE — Patient Instructions (Addendum)
#   Prednisone 50 mg orally 24 hours and 1--2 hours prior to scan. [script sent]  # Singulair (Generic: montelukast) 10 mg orally 24 hours and 1-2 hours prior to scan x4 days total. [script sent]  # Benadryl 1 pill 50 mg orally 1 hour prior to scan. [over the counter]  # Pepcid (Generic: famotidine) 20 mg orally 1 hour prior to scan. [over the counter]

## 2019-07-16 ENCOUNTER — Other Ambulatory Visit: Payer: Self-pay | Admitting: *Deleted

## 2019-07-16 MED ORDER — METOPROLOL TARTRATE 25 MG PO TABS: ORAL_TABLET | ORAL | 1 refills | Status: DC

## 2019-07-30 DIAGNOSIS — M25561 Pain in right knee: Secondary | ICD-10-CM | POA: Diagnosis not present

## 2019-07-30 DIAGNOSIS — S86911A Strain of unspecified muscle(s) and tendon(s) at lower leg level, right leg, initial encounter: Secondary | ICD-10-CM | POA: Diagnosis not present

## 2019-08-08 ENCOUNTER — Ambulatory Visit: Payer: PPO

## 2019-08-15 ENCOUNTER — Ambulatory Visit: Payer: PPO

## 2019-08-23 ENCOUNTER — Ambulatory Visit: Payer: PPO | Attending: Internal Medicine

## 2019-08-23 DIAGNOSIS — Z23 Encounter for immunization: Secondary | ICD-10-CM | POA: Insufficient documentation

## 2019-08-23 NOTE — Progress Notes (Signed)
   Covid-19 Vaccination Clinic  Name:  Steve Gilbert    MRN: [id number] DOB: 27-Nov-1950  08/23/2019  Steve Gilbert was observed post Covid-19 immunization for 15 minutes without incidence. He was provided with Vaccine Information Sheet and instruction to access the V-Safe system.   Steve Gilbert was instructed to call 911 with any severe reactions post vaccine: Marland Kitchen Difficulty breathing  . Swelling of your face and throat  . A fast heartbeat  . A bad rash all over your body  . Dizziness and weakness    Immunizations Administered    Name Date Dose VIS Date Route   Pfizer COVID-19 Vaccine 08/23/2019  5:00 PM 0.3 mL 06/28/2019 Intramuscular   Manufacturer: [place]   Lot: [id number]   [place]: [id number]

## 2019-09-10 DIAGNOSIS — C775 Secondary and unspecified malignant neoplasm of intrapelvic lymph nodes: Secondary | ICD-10-CM | POA: Diagnosis not present

## 2019-09-10 DIAGNOSIS — R7881 Bacteremia: Secondary | ICD-10-CM | POA: Diagnosis not present

## 2019-09-10 DIAGNOSIS — Z936 Other artificial openings of urinary tract status: Secondary | ICD-10-CM | POA: Diagnosis not present

## 2019-09-10 DIAGNOSIS — A4901 Methicillin susceptible Staphylococcus aureus infection, unspecified site: Secondary | ICD-10-CM | POA: Diagnosis not present

## 2019-09-10 DIAGNOSIS — C678 Malignant neoplasm of overlapping sites of bladder: Secondary | ICD-10-CM | POA: Diagnosis not present

## 2019-09-10 DIAGNOSIS — N130 Hydronephrosis with ureteropelvic junction obstruction: Secondary | ICD-10-CM | POA: Diagnosis not present

## 2019-09-12 DIAGNOSIS — Z939 Artificial opening status, unspecified: Secondary | ICD-10-CM | POA: Diagnosis not present

## 2019-09-12 DIAGNOSIS — J449 Chronic obstructive pulmonary disease, unspecified: Secondary | ICD-10-CM | POA: Diagnosis not present

## 2019-09-12 DIAGNOSIS — I10 Essential (primary) hypertension: Secondary | ICD-10-CM | POA: Diagnosis not present

## 2019-09-12 DIAGNOSIS — E7800 Pure hypercholesterolemia, unspecified: Secondary | ICD-10-CM | POA: Diagnosis not present

## 2019-09-12 DIAGNOSIS — R7309 Other abnormal glucose: Secondary | ICD-10-CM | POA: Diagnosis not present

## 2019-09-17 ENCOUNTER — Ambulatory Visit: Payer: PPO | Attending: Internal Medicine

## 2019-09-17 DIAGNOSIS — Z23 Encounter for immunization: Secondary | ICD-10-CM

## 2019-09-17 NOTE — Progress Notes (Signed)
   Covid-19 Vaccination Clinic  Name:  Steve Gilbert    MRN: [id number] DOB: [birth year]  09/17/2019  [name] was observed post Covid-19 immunization for 15 minutes without incident. He was provided with Vaccine Information Sheet and instruction to access the V-Safe system.   [name] was instructed to call 911 with any severe reactions post vaccine: Marland Kitchen Difficulty breathing  . Swelling of face and throat  . A fast heartbeat  . A bad rash all over body  . Dizziness and weakness   Immunizations Administered    Name Date Dose VIS Date Route   Pfizer COVID-19 Vaccine 09/17/2019  3:42 PM 0.3 mL 06/28/2019 Intramuscular   Manufacturer: [place]   Lot: [id number]   [place]: [id number]

## 2019-09-20 DIAGNOSIS — H903 Sensorineural hearing loss, bilateral: Secondary | ICD-10-CM | POA: Diagnosis not present

## 2019-10-01 DIAGNOSIS — C775 Secondary and unspecified malignant neoplasm of intrapelvic lymph nodes: Secondary | ICD-10-CM | POA: Diagnosis not present

## 2019-10-01 DIAGNOSIS — C678 Malignant neoplasm of overlapping sites of bladder: Secondary | ICD-10-CM | POA: Diagnosis not present

## 2019-10-25 DIAGNOSIS — A4901 Methicillin susceptible Staphylococcus aureus infection, unspecified site: Secondary | ICD-10-CM | POA: Diagnosis not present

## 2019-10-25 DIAGNOSIS — C775 Secondary and unspecified malignant neoplasm of intrapelvic lymph nodes: Secondary | ICD-10-CM | POA: Diagnosis not present

## 2019-10-25 DIAGNOSIS — Z936 Other artificial openings of urinary tract status: Secondary | ICD-10-CM | POA: Diagnosis not present

## 2019-10-25 DIAGNOSIS — N130 Hydronephrosis with ureteropelvic junction obstruction: Secondary | ICD-10-CM | POA: Diagnosis not present

## 2019-10-25 DIAGNOSIS — C678 Malignant neoplasm of overlapping sites of bladder: Secondary | ICD-10-CM | POA: Diagnosis not present

## 2019-10-25 DIAGNOSIS — R7881 Bacteremia: Secondary | ICD-10-CM | POA: Diagnosis not present

## 2019-12-11 DIAGNOSIS — R7881 Bacteremia: Secondary | ICD-10-CM | POA: Diagnosis not present

## 2019-12-11 DIAGNOSIS — Z936 Other artificial openings of urinary tract status: Secondary | ICD-10-CM | POA: Diagnosis not present

## 2019-12-11 DIAGNOSIS — N130 Hydronephrosis with ureteropelvic junction obstruction: Secondary | ICD-10-CM | POA: Diagnosis not present

## 2019-12-11 DIAGNOSIS — C678 Malignant neoplasm of overlapping sites of bladder: Secondary | ICD-10-CM | POA: Diagnosis not present

## 2019-12-11 DIAGNOSIS — A4901 Methicillin susceptible Staphylococcus aureus infection, unspecified site: Secondary | ICD-10-CM | POA: Diagnosis not present

## 2019-12-11 DIAGNOSIS — C775 Secondary and unspecified malignant neoplasm of intrapelvic lymph nodes: Secondary | ICD-10-CM | POA: Diagnosis not present

## 2019-12-19 ENCOUNTER — Telehealth: Payer: Self-pay | Admitting: *Deleted

## 2019-12-19 NOTE — Telephone Encounter (Signed)
Patient's ct scan was rescheduled and moved to the [facility] location. Patient has a previous history of reactions to contrast dye. I confirmed with patient that he has his premedications prep. He has already picked up these prescriptions. Dr. Yevette Edwards made aware.

## 2019-12-26 ENCOUNTER — Ambulatory Visit: Admission: RE | Admit: 2019-12-26 | Payer: PPO | Source: Ambulatory Visit

## 2019-12-26 ENCOUNTER — Other Ambulatory Visit: Payer: Self-pay

## 2019-12-26 ENCOUNTER — Inpatient Hospital Stay: Payer: PPO | Attending: Internal Medicine

## 2019-12-26 ENCOUNTER — Ambulatory Visit
Admission: RE | Admit: 2019-12-26 | Discharge: 2019-12-26 | Disposition: A | Discharge status: Home / Self Care | Payer: PPO | Source: Ambulatory Visit | Attending: Internal Medicine | Admitting: Internal Medicine

## 2019-12-26 DIAGNOSIS — I129 Hypertensive chronic kidney disease with stage 1 through stage 4 chronic kidney disease, or unspecified chronic kidney disease: Secondary | ICD-10-CM | POA: Diagnosis not present

## 2019-12-26 DIAGNOSIS — C675 Malignant neoplasm of bladder neck: Secondary | ICD-10-CM | POA: Diagnosis not present

## 2019-12-26 DIAGNOSIS — R21 Rash and other nonspecific skin eruption: Secondary | ICD-10-CM | POA: Insufficient documentation

## 2019-12-26 DIAGNOSIS — N200 Calculus of kidney: Secondary | ICD-10-CM | POA: Diagnosis not present

## 2019-12-26 DIAGNOSIS — N134 Hydroureter: Secondary | ICD-10-CM | POA: Diagnosis not present

## 2019-12-26 DIAGNOSIS — N182 Chronic kidney disease, stage 2 (mild): Secondary | ICD-10-CM | POA: Insufficient documentation

## 2019-12-26 DIAGNOSIS — Z79899 Other long term (current) drug therapy: Secondary | ICD-10-CM | POA: Insufficient documentation

## 2019-12-26 LAB — COMPREHENSIVE METABOLIC PANEL
ALT: 30 U/L (ref 0–44)
AST: 24 U/L (ref 15–41)
Albumin: 4.3 g/dL (ref 3.5–5.0)
Alkaline Phosphatase: 74 U/L (ref 38–126)
Anion gap: 11 (ref 5–15)
BUN: 25 mg/dL — ABNORMAL HIGH (ref 8–23)
CO2: 31 mmol/L (ref 22–32)
Calcium: 9.3 mg/dL (ref 8.9–10.3)
Chloride: 100 mmol/L (ref 98–111)
Creatinine, Ser: 1.38 mg/dL — ABNORMAL HIGH (ref 0.61–1.24)
GFR calc Af Amer: >60 mL/min (ref >60)
GFR calc non Af Amer: 52 mL/min — ABNORMAL LOW (ref >60)
Glucose, Bld: 116 mg/dL — ABNORMAL HIGH (ref 70–99)
Potassium: 3.9 mmol/L (ref 3.5–5.1)
Sodium: 142 mmol/L (ref 135–145)
Total Bilirubin: 0.9 mg/dL (ref 0.3–1.2)
Total Protein: 7.7 g/dL (ref 6.5–8.1)

## 2019-12-26 LAB — CBC WITH DIFFERENTIAL/PLATELET
Abs Immature Granulocytes: 0.04 10*3/uL (ref 0.00–0.07)
Basophils Absolute: 0.1 10*3/uL (ref 0.0–0.1)
Basophils Relative: 1 %
Eosinophils Absolute: 0.3 10*3/uL (ref 0.0–0.5)
Eosinophils Relative: 3 %
HCT: 45.8 % (ref 39.0–52.0)
Hemoglobin: 16 g/dL (ref 13.0–17.0)
Immature Granulocytes: 0 %
Lymphocytes Relative: 16 %
Lymphs Abs: 1.8 10*3/uL (ref 0.7–4.0)
MCH: 31.1 pg (ref 26.0–34.0)
MCHC: 34.9 g/dL (ref 30.0–36.0)
MCV: 88.9 fL (ref 80.0–100.0)
Monocytes Absolute: 1.2 10*3/uL — ABNORMAL HIGH (ref 0.1–1.0)
Monocytes Relative: 12 %
Neutro Abs: 7.4 10*3/uL (ref 1.7–7.7)
Neutrophils Relative %: 68 %
Platelets: 215 10*3/uL (ref 150–400)
RBC: 5.15 MIL/uL (ref 4.22–5.81)
RDW: 12.8 % (ref 11.5–15.5)
WBC: 10.8 10*3/uL — ABNORMAL HIGH (ref 4.0–10.5)
nRBC: 0 % (ref 0.0–0.2)

## 2019-12-26 MED ORDER — IOHEXOL 300 MG/ML  SOLN
100.0000 mL | Freq: Once | INTRAMUSCULAR | Status: AC | PRN
  Administered 2019-12-26: 100 mL via INTRAVENOUS

## 2019-12-27 ENCOUNTER — Inpatient Hospital Stay: Payer: PPO

## 2019-12-27 ENCOUNTER — Inpatient Hospital Stay: Payer: PPO | Admitting: Internal Medicine

## 2019-12-27 DIAGNOSIS — C675 Malignant neoplasm of bladder neck: Secondary | ICD-10-CM | POA: Diagnosis not present

## 2019-12-27 DIAGNOSIS — R21 Rash and other nonspecific skin eruption: Secondary | ICD-10-CM

## 2019-12-27 MED ORDER — METHYLPREDNISOLONE SODIUM SUCC 125 MG IJ SOLR
80.0000 mg | Freq: Once | INTRAMUSCULAR | Status: AC
  Administered 2019-12-27: 80 mg via INTRAMUSCULAR
  Filled 2019-12-27: qty 2

## 2019-12-27 NOTE — Progress Notes (Signed)
[place] NOTE  Patient Care Team: Kirk Ruths, MD as PCP - General (Internal Medicine) Hollice Espy, MD as Consulting Physician (Urology) Cammie Sickle, MD as Consulting Physician (Internal Medicine)  CHIEF COMPLAINTS/PURPOSE OF CONSULTATION:  Bladder cancer  #  Oncology History Overview Note  # BLADDER CANCER: pT2/cpT4  prostate/bladder neck Dr.Brandon TURP s/p  - INVASIVE UROTHELIAL CARCINOMA, HIGH-GRADE;  TUMOR INVADES BLADDER NECK MUSCULARIS PROPRIA. Mild-mod Right hydronephrosis [facility] RISK FEATURES]   # OCt 9t ddMVAC x4; Jan 24th cystectomy [Dr.Manny; [place]] ypTis ypN1; stage III suriveillaince  # Feb 2020- UTI/sepsis- ICU  x3 weeks IV antibiotics/ s-p port removal.   # 2 Echo- 50% to 55%. [oct 5397]   # COPD/ not on O2; [quit 2009];    DIAGNOSIS: BLADDER CA  STAGE: Stage III;GOALS: cure  CURRENT/MOST RECENT THERAPY : Surveillance    Cancer of bladder neck (HCC)     HISTORY OF PRESENTING ILLNESS:  Steve Gilbert 69 y.o.  male with muscle invasive bladder cancer status post neoadjuvant chemotherapy-followed by cystectomy is here for follow-up/review the results of the recent CT scan.  Patient is currently going through lot of social stress as his wife is not doing well from her ovarian malignancy.  She is just been enrolled into hospice.  Patient denies any nausea vomiting abdominal pain.  Denies any chest pain or shortness with cough.  Denies any bone pain.  Patient had a dye prep prior to his CT scan.  However notes to have residual mild rash/itching in between his legs buttocks.   Review of Systems  Constitutional: Negative for chills, diaphoresis, fever and weight loss.  HENT: Negative for nosebleeds and sore throat.   Eyes: Negative for double vision.  Cardiovascular: Negative for chest pain, palpitations, orthopnea and leg swelling.  Gastrointestinal: Negative for abdominal pain, blood in stool, constipation,  diarrhea, heartburn, melena, nausea and vomiting.  Genitourinary: Negative for dysuria, frequency and urgency.  Musculoskeletal: Negative for back pain and joint pain.  Skin: Positive for itching and rash.  Neurological: Negative for dizziness, tingling, focal weakness, weakness and headaches.  Endo/Heme/Allergies: Does not bruise/bleed easily.  Psychiatric/Behavioral: Negative for depression. The patient is not nervous/anxious and does not have insomnia.      MEDICAL HISTORY:  Past Medical History:  Diagnosis Date  . Acid reflux   . Allergy   . Cancer of bladder neck ([place]) 02/2018   Chemo tx's and surgical resection.  Marland Kitchen COPD (chronic obstructive pulmonary disease) ([place])   . High cholesterol   . Hyperlipidemia 04/03/2018  . Hypertension   . Pneumonia     SURGICAL HISTORY: Past Surgical History:  Procedure Laterality Date  . CYSTOSCOPY W/ URETERAL STENT REMOVAL Right 08/10/2018   Procedure: CYSTOSCOPY WITH STENT REMOVAL;  Surgeon: Alexis Frock, MD;  Location: [facility];  Service: Urology;  Laterality: Right;  . CYSTOSCOPY WITH BIOPSY N/A 04/04/2018   Procedure: CYSTOSCOPY WITH TURBT;  Surgeon: Hollice Espy, MD;  Location: [facility];  Service: Urology;  Laterality: N/A;  . CYSTOSCOPY WITH URETEROSCOPY AND STENT PLACEMENT Right 04/04/2018   Procedure: CYSTOSCOPY WITH URETEROSCOPY AND STENT PLACEMENT;  Surgeon: Hollice Espy, MD;  Location: [facility];  Service: Urology;  Laterality: Right;  . NASAL SINUS SURGERY    . PORTA CATH INSERTION N/A 04/23/2018   Procedure: PORTA CATH INSERTION;  Surgeon: Algernon Huxley, MD;  Location: [place] [facility];  Service: Cardiovascular;  Laterality: N/A;  . PORTA CATH REMOVAL N/A 09/04/2018   Procedure:  PORTA CATH REMOVAL;  Surgeon: Katha Cabal, MD;  Location: [place] [facility];  Service: Cardiovascular;  Laterality: N/A;  . TEE WITHOUT CARDIOVERSION N/A 09/05/2018   Procedure: TRANSESOPHAGEAL ECHOCARDIOGRAM (TEE);  Surgeon: Minna Merritts, MD;  Location: [facility];  Service: Cardiovascular;  Laterality: N/A;  . TRANSURETHRAL RESECTION OF PROSTATE N/A 04/04/2018   Procedure: TRANSURETHRAL RESECTION OF THE PROSTATE (TURP);  Surgeon: Hollice Espy, MD;  Location: [facility];  Service: Urology;  Laterality: N/A;  . URETERAL BIOPSY Right 04/04/2018   Procedure: URETERAL BIOPSY;  Surgeon: Hollice Espy, MD;  Location: [facility];  Service: Urology;  Laterality: Right;    SOCIAL HISTORY: pt lives with his wife; currently retired.  Works part-time.  Remote history of smoking.  No alcohol. Social History   Socioeconomic History  . Marital status: Married    Spouse name: Not on file  . Number of children: Not on file  . Years of education: Not on file  . Highest education level: Not on file  Occupational History  . Not on file  Tobacco Use  . Smoking status: Former Smoker    Packs/day: 1.50    Years: 30.00    Pack years: 45.00    Types: Cigarettes    Quit date: 2009    Years since quitting: 12.4  . Smokeless tobacco: Former Network engineer  . Vaping Use: Never used  Substance and Sexual Activity  . Alcohol use: Yes    Alcohol/week: 7.0 standard drinks    Types: 5 Cans of beer, 2 Standard drinks or equivalent per week    Comment: ocassional   . Drug use: Not Currently  . Sexual activity: Not Currently  Other Topics Concern  . Not on file  Social History Narrative  . Not on file   Social Determinants of Health   Financial Resource Strain:   . Difficulty of Paying Living Expenses:   Food Insecurity:   . Worried About Charity fundraiser in the Last Year:   . Arboriculturist in the Last Year:   Transportation Needs:   . Film/video editor (Medical):   Marland Kitchen Lack of Transportation (Non-Medical):   Physical Activity:   . Days of Exercise per Week:   . Minutes of Exercise per Session:   Stress:   . Feeling of Stress :   Social Connections:   . Frequency of Communication with Friends and Family:   . Frequency  of Social Gatherings with Friends and Family:   . Attends Religious Services:   . Active Member of Clubs or Organizations:   . Attends Archivist Meetings:   Marland Kitchen Marital Status:   Intimate Partner Violence:   . Fear of Current or Ex-Partner:   . Emotionally Abused:   Marland Kitchen Physically Abused:   . Sexually Abused:     FAMILY HISTORY: Family History  Problem Relation Age of Onset  . Diabetes Mother   . Heart disease Father   . Diabetes Brother   . Heart attack Brother   . Heart disease Brother     ALLERGIES:  is allergic to contrast media [iodinated diagnostic agents].  MEDICATIONS:  Current Outpatient Medications  Medication Sig Dispense Refill  . amLODipine (NORVASC) 10 MG tablet Take 10 mg by mouth daily.    Marland Kitchen aspirin EC 81 MG tablet Take 81 mg by mouth daily.    Marland Kitchen atorvastatin (LIPITOR) 20 MG tablet Take 20 mg by mouth daily.    Marland Kitchen azelastine (  ASTELIN) 0.1 % nasal spray Place 1 spray into both nostrils 2 (two) times daily as needed (congestion). Use in each nostril as directed     . Fluticasone-Salmeterol (ADVAIR) 250-50 MCG/DOSE AEPB Inhale 1 puff into the lungs 2 (two) times daily as needed (shortness of breath).     . hydrochlorothiazide (HYDRODIURIL) 12.5 MG tablet Take 12.5 mg by mouth daily.   9  . metoprolol tartrate (LOPRESSOR) 25 MG tablet TAKE 1 TABLET(25 MG) BY MOUTH TWICE DAILY 180 tablet 1  . montelukast (SINGULAIR) 10 MG tablet Take 10 mg orally 24 hours and 1-2 hours prior to scan x 4 days total. 4 tablet 0  . Multiple Vitamins-Minerals (MULTIVITAMIN WITH MINERALS) tablet Take 1 tablet by mouth daily.    . pantoprazole (PROTONIX) 40 MG tablet Take 40 mg by mouth every evening.     . predniSONE (DELTASONE) 50 MG tablet Take 1 pill 50 mg orally 24 hours and 1-2 hours prior to scan. 2 tablet 0  . tiotropium (SPIRIVA) 18 MCG inhalation capsule Place 18 mcg into inhaler and inhale daily as needed (congestion).      No current facility-administered medications for  this visit.      Marland Kitchen  PHYSICAL EXAMINATION: ECOG PERFORMANCE STATUS: 1 - Symptomatic but completely ambulatory  Vitals:   12/27/19 1026  BP: 135/80  Pulse: 70  Temp: 98.2 F (36.8 C)  SpO2: 95%   Filed Weights   12/27/19 1026  Weight: 218 lb 6.4 oz (99.1 kg)    Physical Exam Constitutional:      Comments: He is alone.  Walking himself.  HENT:     Head: Normocephalic and atraumatic.     Mouth/Throat:     Pharynx: No oropharyngeal exudate.  Eyes:     Pupils: Pupils are equal, round, and reactive to light.  Cardiovascular:     Rate and Rhythm: Normal rate and regular rhythm.  Pulmonary:     Effort: No respiratory distress.     Breath sounds: Normal breath sounds.  Abdominal:     General: Bowel sounds are normal. There is no distension.     Palpations: Abdomen is soft. There is no mass.     Tenderness: There is no abdominal tenderness. There is no guarding or rebound.     Comments: Urostomy.   Musculoskeletal:        General: No tenderness. Normal range of motion.     Cervical back: Normal range of motion and neck supple.  Skin:    General: Skin is warm.     Comments: Mild macular rash bilateral medial thighs and buttocks.  Neurological:     Mental Status: He is alert and oriented to person, place, and time.  Psychiatric:        Mood and Affect: Affect normal.      LABORATORY DATA:  I have reviewed the data as listed Lab Results  Component Value Date   WBC 10.8 (H) 12/26/2019   HGB 16.0 12/26/2019   HCT 45.8 12/26/2019   MCV 88.9 12/26/2019   PLT 215 12/26/2019   Recent Labs    01/02/19 1009 07/05/19 1003 12/26/19 0929  NA 140 138 142  K 4.3 3.7 3.9  CL 101 103 100  CO2 29 26 31   GLUCOSE 149* 126* 116*  BUN 20 17 25*  CREATININE 1.46* 1.24 1.38*  CALCIUM 9.3 8.9 9.3  GFRNONAA 49* 59* 52*  GFRAA 57* >60 >60  PROT 7.3 6.8 7.7  ALBUMIN 4.1 4.0 4.3  AST 22 26 24   ALT 22 27 30   ALKPHOS 79 77 74  BILITOT 0.7 0.9 0.9    RADIOGRAPHIC  STUDIES: I have personally reviewed the radiological images as listed and agreed with the findings in the report. CT ABDOMEN PELVIS W CONTRAST  Result Date: 12/26/2019 CLINICAL DATA:  Follow-up bladder carcinoma. EXAM: CT ABDOMEN AND PELVIS WITH CONTRAST TECHNIQUE: Multidetector CT imaging of the abdomen and pelvis was performed using the standard protocol following bolus administration of intravenous contrast. CONTRAST:  142mL OMNIPAQUE IOHEXOL 300 MG/ML  SOLN COMPARISON:  Noncontrast CT on 07/04/2019 FINDINGS: Lower Chest: No acute findings. Hepatobiliary: No hepatic masses identified. A few tiny sub-cm cysts remains stable. Mild hepatic steatosis again seen. Gallbladder is unremarkable. No evidence of biliary ductal dilatation. Pancreas:  No mass or inflammatory changes. Spleen: Within normal limits in size and appearance. Adrenals/Urinary Tract: No masses identified. Mild right renal scarring and small calyceal diverticula again seen which contains a 4 mm calculus. No evidence of ureteral calculi or hydronephrosis. Stable postop changes from previous cystoprostatectomy and ileal conduit. Stomach/Bowel: No evidence of obstruction, inflammatory process or abnormal fluid collections. Diverticulosis is seen mainly involving the sigmoid colon, however there is no evidence of diverticulitis. Vascular/Lymphatic: No pathologically enlarged lymph nodes. No abdominal aortic aneurysm. Reproductive: Prior cystoprostatectomy. No evidence of recurrent mass. Other: A small midline epigastric ventral hernia is seen which contains only fat, but has increased in size since previous study. Musculoskeletal:  No suspicious bone lesions identified. IMPRESSION: 1. Stable postop changes from previous cystoprostatectomy and ileal conduit. No evidence of recurrent or metastatic carcinoma within the abdomen or pelvis. 2. Increased size of small epigastric ventral hernia containing only fat. 3. Colonic diverticulosis. No radiographic  evidence of diverticulitis. 4. Mild hepatic steatosis. 5. Right nephrolithiasis. No evidence of ureteral calculi or hydronephrosis. Electronically Signed   By: Marlaine Hind M.D.   On: 12/26/2019 16:00    ASSESSMENT & PLAN:   Cancer of bladder neck (HCC) #Transitional cell bladder cancer neo-adjuvant chemo- Currently s/p 4 cycles of dose dense MVAC; status post cystectomy. # June 9th-CT scan contrast- abdomen pelvis negative for any recurrence.  Stable.  #We will continue surveillance imaging every 6 months; will order imaging at next visit.   #Skin rash/buttocks thighs-residual from the IV dye; recommend Solu-Medrol 80 mg x 1  # Chronic kidney disease-stage II- STABLE.   #Hypertension-clinically stable;- 341D systolic-  continue antihypertensives.  # DISPOSITION: # follow up in mid- December 2021- MD; labs-cbc/cmp-Dr.B  Cc; Dr.manny.   # I reviewed the blood work- with the patient in detail; also reviewed the imaging independently [as summarized above]; and with the patient in detail.    All questions were answered. The patient knows to call the clinic with any problems, questions or concerns.     Cammie Sickle, MD 12/27/2019 1:49 PM

## 2019-12-27 NOTE — Assessment & Plan Note (Addendum)
#  Transitional cell bladder cancer neo-adjuvant chemo- Currently s/p 4 cycles of dose dense MVAC; status post cystectomy. # June 9th-CT scan contrast- abdomen pelvis negative for any recurrence.  Stable.  #We will continue surveillance imaging every 6 months; will order imaging at next visit.   #Skin rash/buttocks thighs-residual from the IV dye; recommend Solu-Medrol 80 mg x 1  # Chronic kidney disease-stage II- STABLE.   #Hypertension-clinically stable;- 921J systolic-  continue antihypertensives.  # DISPOSITION: # follow up in mid- December 2021- MD; labs-cbc/cmp-Dr.B  Cc; Dr.manny.   # I reviewed the blood work- with the patient in detail; also reviewed the imaging independently [as summarized above]; and with the patient in detail.

## 2019-12-30 DIAGNOSIS — T508X1A Poisoning by diagnostic agents, accidental (unintentional), initial encounter: Secondary | ICD-10-CM | POA: Diagnosis not present

## 2019-12-30 DIAGNOSIS — T508X5A Adverse effect of diagnostic agents, initial encounter: Secondary | ICD-10-CM | POA: Diagnosis not present

## 2020-01-22 ENCOUNTER — Other Ambulatory Visit: Payer: Self-pay | Admitting: *Deleted

## 2020-01-22 MED ORDER — METOPROLOL TARTRATE 25 MG PO TABS: ORAL_TABLET | ORAL | 1 refills | Status: DC

## 2020-02-12 DIAGNOSIS — C775 Secondary and unspecified malignant neoplasm of intrapelvic lymph nodes: Secondary | ICD-10-CM | POA: Diagnosis not present

## 2020-02-12 DIAGNOSIS — R7881 Bacteremia: Secondary | ICD-10-CM | POA: Diagnosis not present

## 2020-02-12 DIAGNOSIS — C678 Malignant neoplasm of overlapping sites of bladder: Secondary | ICD-10-CM | POA: Diagnosis not present

## 2020-02-12 DIAGNOSIS — Z936 Other artificial openings of urinary tract status: Secondary | ICD-10-CM | POA: Diagnosis not present

## 2020-02-12 DIAGNOSIS — A4901 Methicillin susceptible Staphylococcus aureus infection, unspecified site: Secondary | ICD-10-CM | POA: Diagnosis not present

## 2020-02-12 DIAGNOSIS — N130 Hydronephrosis with ureteropelvic junction obstruction: Secondary | ICD-10-CM | POA: Diagnosis not present

## 2020-03-11 DIAGNOSIS — Z0000 Encounter for general adult medical examination without abnormal findings: Secondary | ICD-10-CM | POA: Diagnosis not present

## 2020-03-11 DIAGNOSIS — J449 Chronic obstructive pulmonary disease, unspecified: Secondary | ICD-10-CM | POA: Diagnosis not present

## 2020-03-11 DIAGNOSIS — Z933 Colostomy status: Secondary | ICD-10-CM | POA: Diagnosis not present

## 2020-03-11 DIAGNOSIS — I10 Essential (primary) hypertension: Secondary | ICD-10-CM | POA: Diagnosis not present

## 2020-03-11 DIAGNOSIS — E7800 Pure hypercholesterolemia, unspecified: Secondary | ICD-10-CM | POA: Diagnosis not present

## 2020-03-11 DIAGNOSIS — R7309 Other abnormal glucose: Secondary | ICD-10-CM | POA: Diagnosis not present

## 2020-03-26 DIAGNOSIS — C678 Malignant neoplasm of overlapping sites of bladder: Secondary | ICD-10-CM | POA: Diagnosis not present

## 2020-03-26 DIAGNOSIS — R7881 Bacteremia: Secondary | ICD-10-CM | POA: Diagnosis not present

## 2020-03-26 DIAGNOSIS — A4901 Methicillin susceptible Staphylococcus aureus infection, unspecified site: Secondary | ICD-10-CM | POA: Diagnosis not present

## 2020-03-26 DIAGNOSIS — Z936 Other artificial openings of urinary tract status: Secondary | ICD-10-CM | POA: Diagnosis not present

## 2020-03-26 DIAGNOSIS — N130 Hydronephrosis with ureteropelvic junction obstruction: Secondary | ICD-10-CM | POA: Diagnosis not present

## 2020-03-26 DIAGNOSIS — C775 Secondary and unspecified malignant neoplasm of intrapelvic lymph nodes: Secondary | ICD-10-CM | POA: Diagnosis not present

## 2020-06-01 DIAGNOSIS — A4901 Methicillin susceptible Staphylococcus aureus infection, unspecified site: Secondary | ICD-10-CM | POA: Diagnosis not present

## 2020-06-01 DIAGNOSIS — C678 Malignant neoplasm of overlapping sites of bladder: Secondary | ICD-10-CM | POA: Diagnosis not present

## 2020-06-01 DIAGNOSIS — N130 Hydronephrosis with ureteropelvic junction obstruction: Secondary | ICD-10-CM | POA: Diagnosis not present

## 2020-06-01 DIAGNOSIS — C775 Secondary and unspecified malignant neoplasm of intrapelvic lymph nodes: Secondary | ICD-10-CM | POA: Diagnosis not present

## 2020-06-01 DIAGNOSIS — Z936 Other artificial openings of urinary tract status: Secondary | ICD-10-CM | POA: Diagnosis not present

## 2020-06-01 DIAGNOSIS — R7881 Bacteremia: Secondary | ICD-10-CM | POA: Diagnosis not present

## 2020-06-13 IMAGING — CT CT ABD-PELV W/ CM
2 of 5 series · 16 of 46 positions shown, 18 images · IV contrast (APPLIED)
Comparison: Noncontrast CT on 07/04/2019

CLINICAL DATA: Follow-up bladder carcinoma.

EXAM:
CT ABDOMEN AND PELVIS WITH CONTRAST
TECHNIQUE: Multidetector CT imaging of the abdomen and pelvis was performed
using the standard protocol following bolus administration of
intravenous contrast.
CONTRAST:  100mL OMNIPAQUE IOHEXOL 300 MG/ML  SOLN

[Series 2: axial st · axial · 0.86mm/px · z∈[-929,-524]mm · 13 of 93 slices shown, 15 images]
[im 6/93  soft-tissue]
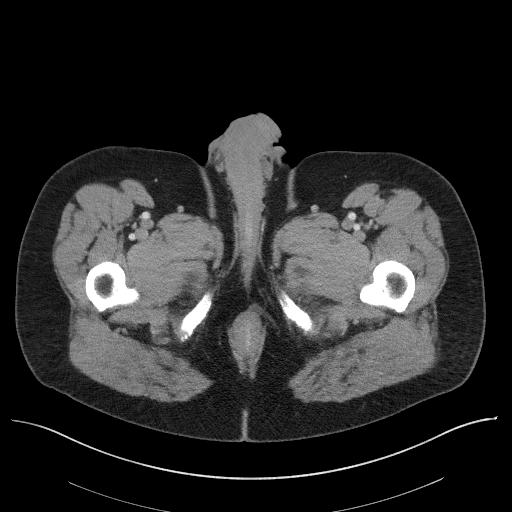
[im 6/93  bone]
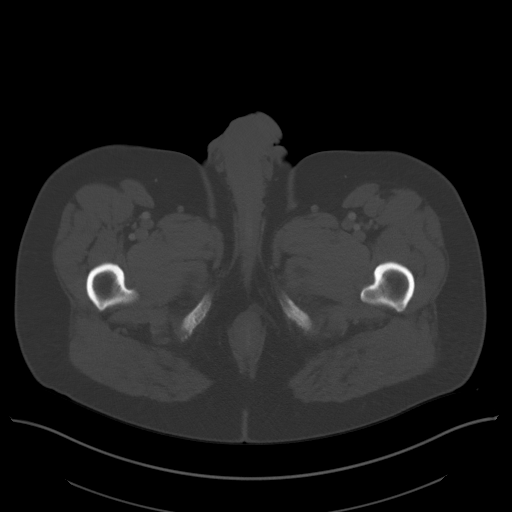
[im 11/93  soft-tissue]
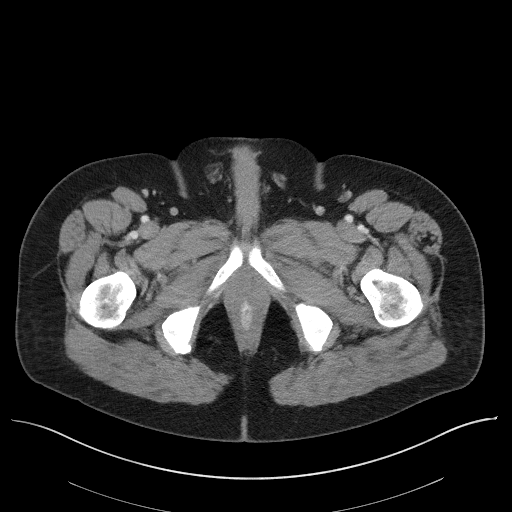
[im 22/93  soft-tissue]
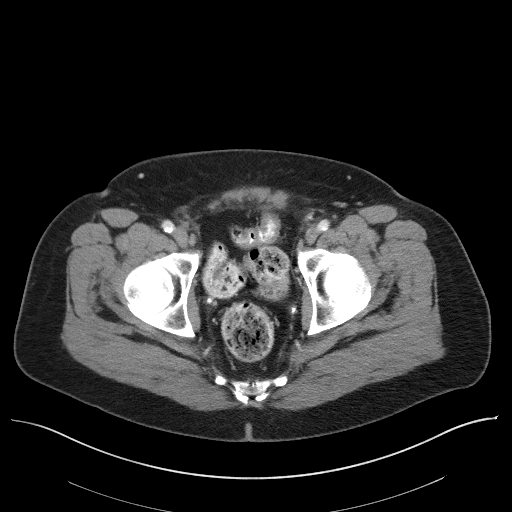
[im 28/93  soft-tissue]
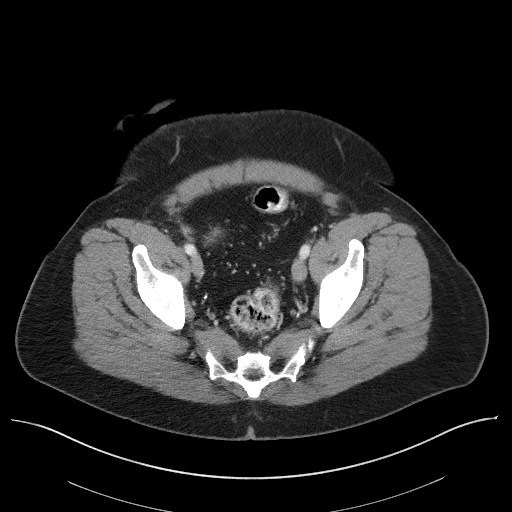
[im 33/93  soft-tissue]
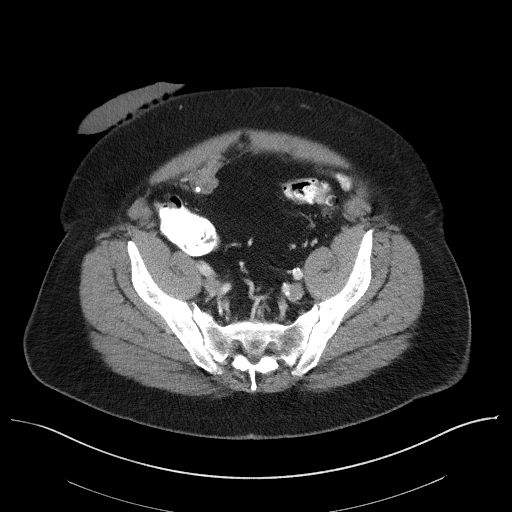
[im 38/93  soft-tissue]
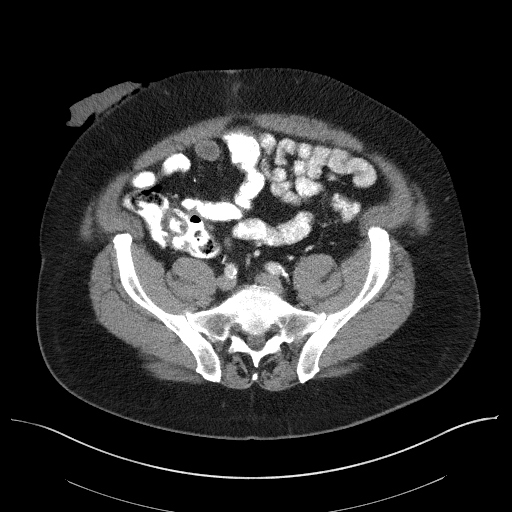
[im 49/93  soft-tissue]
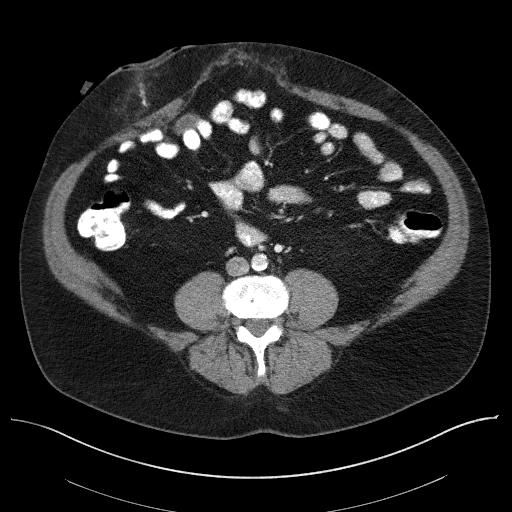
[im 55/93  soft-tissue]
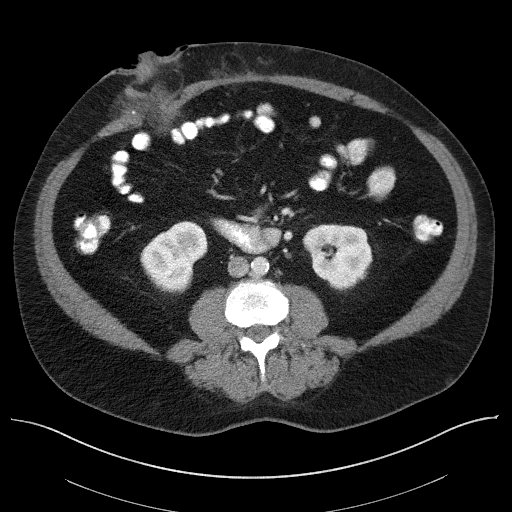
[im 60/93  soft-tissue]
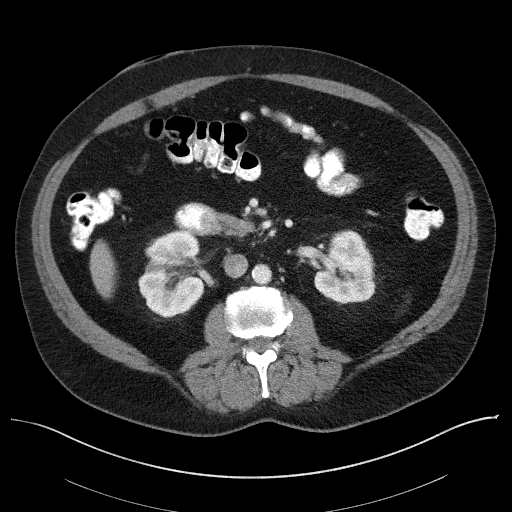
[im 60/93  bone]
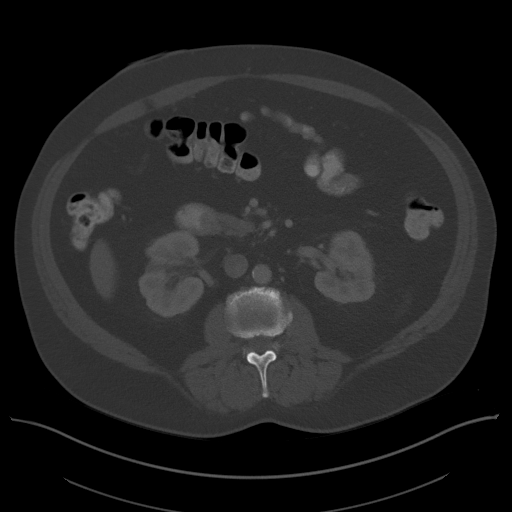
[im 65/93  soft-tissue]
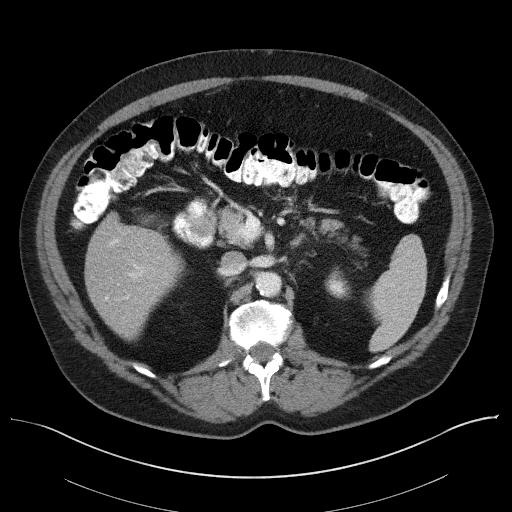
[im 71/93  soft-tissue]
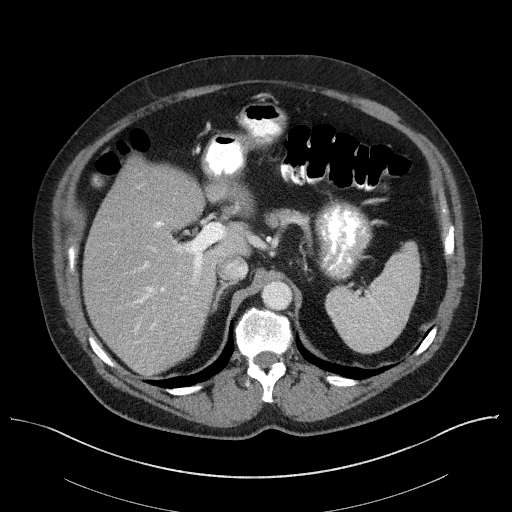
[im 82/93  soft-tissue]
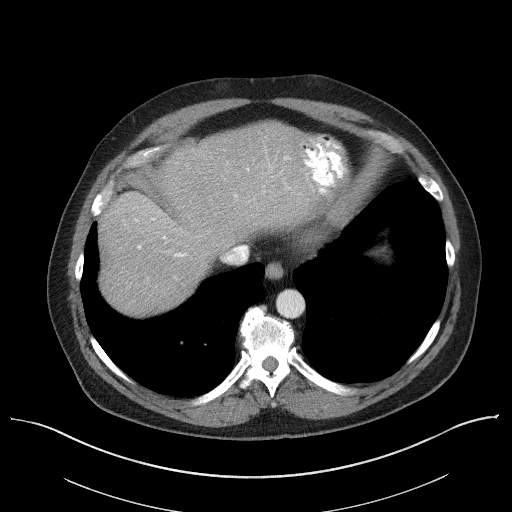
[im 87/93  soft-tissue]
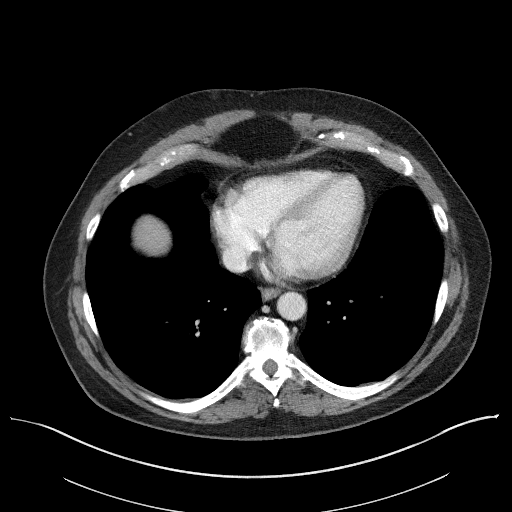

[Series 5: coronal st · coronal · 0.82mm/px · 3 of 116 slices shown]
[im 39/116  soft-tissue]
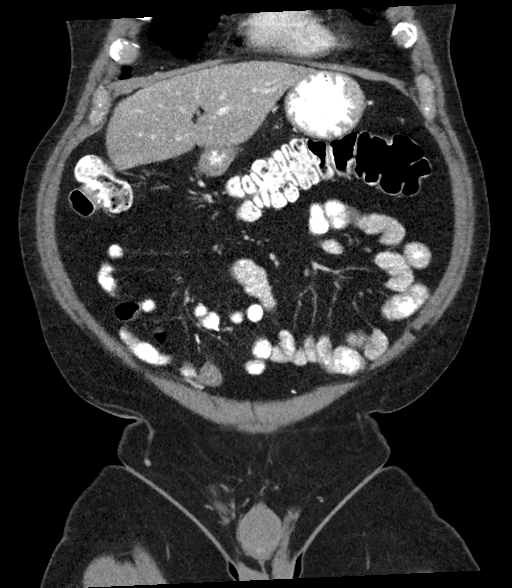
[im 52/116  soft-tissue]
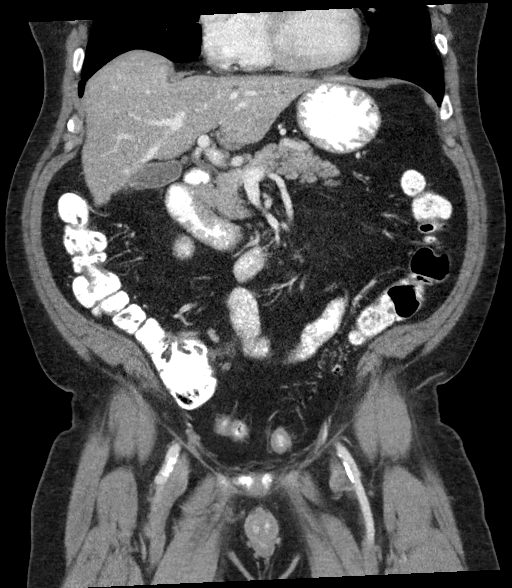
[im 64/116  soft-tissue]
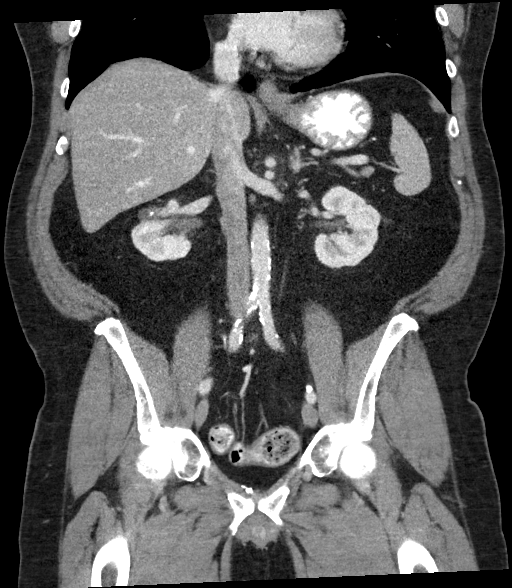

[16 of 46 positions shown; findings below may reference images not displayed]

FINDINGS: Lower Chest: No acute findings.

Hepatobiliary: No hepatic masses identified. A few tiny sub-cm cysts
remains stable. Mild hepatic steatosis again seen. Gallbladder is
unremarkable. No evidence of biliary ductal dilatation.

Pancreas:  No mass or inflammatory changes.

Spleen: Within normal limits in size and appearance.

Adrenals/Urinary Tract: No masses identified. Mild right renal
scarring and small calyceal diverticula again seen which contains a
4 mm calculus. No evidence of ureteral calculi or hydronephrosis.
Stable postop changes from previous cystoprostatectomy and ileal
conduit.

Stomach/Bowel: No evidence of obstruction, inflammatory process or
abnormal fluid collections. Diverticulosis is seen mainly involving
the sigmoid colon, however there is no evidence of diverticulitis.

Vascular/Lymphatic: No pathologically enlarged lymph nodes. No
abdominal aortic aneurysm.

Reproductive: Prior cystoprostatectomy. No evidence of recurrent
mass.

Other: A small midline epigastric ventral hernia is seen which
contains only fat, but has increased in size since previous study.

Musculoskeletal:  No suspicious bone lesions identified.
IMPRESSION: 1. Stable postop changes from previous cystoprostatectomy and ileal
conduit. No evidence of recurrent or metastatic carcinoma within the
abdomen or pelvis.
2. Increased size of small epigastric ventral hernia containing only
fat.
3. Colonic diverticulosis. No radiographic evidence of
diverticulitis.
4. Mild hepatic steatosis.
5. Right nephrolithiasis. No evidence of ureteral calculi or
hydronephrosis.

## 2020-06-29 ENCOUNTER — Telehealth: Payer: Self-pay | Admitting: *Deleted

## 2020-06-29 NOTE — Telephone Encounter (Signed)
Patient called asking if he needs a CT scan fore his appointment on Friday stating that he usually has one. Please advise

## 2020-06-30 NOTE — Telephone Encounter (Signed)
Message left for patient yesterday - 06/29/2020 at 4pm. No answer. Left vm stating that Dr. Rogue Bussing will see him 1'st and then order the subsequent imaging.

## 2020-07-02 ENCOUNTER — Other Ambulatory Visit: Payer: Self-pay

## 2020-07-02 DIAGNOSIS — C675 Malignant neoplasm of bladder neck: Secondary | ICD-10-CM

## 2020-07-03 ENCOUNTER — Other Ambulatory Visit: Payer: Self-pay

## 2020-07-03 ENCOUNTER — Inpatient Hospital Stay: Payer: PPO | Attending: Internal Medicine

## 2020-07-03 ENCOUNTER — Inpatient Hospital Stay: Payer: PPO | Admitting: Internal Medicine

## 2020-07-03 DIAGNOSIS — Z79899 Other long term (current) drug therapy: Secondary | ICD-10-CM | POA: Insufficient documentation

## 2020-07-03 DIAGNOSIS — Z9221 Personal history of antineoplastic chemotherapy: Secondary | ICD-10-CM | POA: Insufficient documentation

## 2020-07-03 DIAGNOSIS — N182 Chronic kidney disease, stage 2 (mild): Secondary | ICD-10-CM | POA: Diagnosis not present

## 2020-07-03 DIAGNOSIS — C675 Malignant neoplasm of bladder neck: Secondary | ICD-10-CM

## 2020-07-03 LAB — CBC WITH DIFFERENTIAL/PLATELET
Abs Immature Granulocytes: 0.03 10*3/uL (ref 0.00–0.07)
Basophils Absolute: 0.1 10*3/uL (ref 0.0–0.1)
Basophils Relative: 1 %
Eosinophils Absolute: 0.4 10*3/uL (ref 0.0–0.5)
Eosinophils Relative: 6 %
HCT: 46.7 % (ref 39.0–52.0)
Hemoglobin: 16.1 g/dL (ref 13.0–17.0)
Immature Granulocytes: 1 %
Lymphocytes Relative: 19 %
Lymphs Abs: 1.1 10*3/uL (ref 0.7–4.0)
MCH: 31.3 pg (ref 26.0–34.0)
MCHC: 34.5 g/dL (ref 30.0–36.0)
MCV: 90.7 fL (ref 80.0–100.0)
Monocytes Absolute: 0.7 10*3/uL (ref 0.1–1.0)
Monocytes Relative: 11 %
Neutro Abs: 3.8 10*3/uL (ref 1.7–7.7)
Neutrophils Relative %: 62 %
Platelets: 207 10*3/uL (ref 150–400)
RBC: 5.15 MIL/uL (ref 4.22–5.81)
RDW: 12.9 % (ref 11.5–15.5)
WBC: 6 10*3/uL (ref 4.0–10.5)
nRBC: 0 % (ref 0.0–0.2)

## 2020-07-03 LAB — COMPREHENSIVE METABOLIC PANEL
ALT: 36 U/L (ref 0–44)
AST: 30 U/L (ref 15–41)
Albumin: 4.2 g/dL (ref 3.5–5.0)
Alkaline Phosphatase: 69 U/L (ref 38–126)
Anion gap: 8 (ref 5–15)
BUN: 20 mg/dL (ref 8–23)
CO2: 32 mmol/L (ref 22–32)
Calcium: 9.1 mg/dL (ref 8.9–10.3)
Chloride: 100 mmol/L (ref 98–111)
Creatinine, Ser: 1.21 mg/dL (ref 0.61–1.24)
GFR, Estimated: >60 mL/min (ref >60)
Glucose, Bld: 139 mg/dL — ABNORMAL HIGH (ref 70–99)
Potassium: 3.9 mmol/L (ref 3.5–5.1)
Sodium: 140 mmol/L (ref 135–145)
Total Bilirubin: 1.1 mg/dL (ref 0.3–1.2)
Total Protein: 7 g/dL (ref 6.5–8.1)

## 2020-07-03 MED ORDER — MONTELUKAST SODIUM 10 MG PO TABS: ORAL_TABLET | ORAL | 0 refills | Status: DC

## 2020-07-03 NOTE — Progress Notes (Signed)
[place] NOTE  Patient Care Team: Kirk Ruths, MD as PCP - General (Internal Medicine) Hollice Espy, MD as Consulting Physician (Urology) Cammie Sickle, MD as Consulting Physician (Internal Medicine)  CHIEF COMPLAINTS/PURPOSE OF CONSULTATION:  Bladder cancer  #  Oncology History Overview Note  # BLADDER CANCER: pT2/cpT4  prostate/bladder neck Dr.Brandon TURP s/p  - INVASIVE UROTHELIAL CARCINOMA, HIGH-GRADE;  TUMOR INVADES BLADDER NECK MUSCULARIS PROPRIA. Mild-mod Right hydronephrosis [facility] RISK FEATURES]   # OCt 9t ddMVAC x4; Jan 24th cystectomy [Dr.Manny; [place]] ypTis ypN1; stage III suriveillaince  # Feb 2020- UTI/sepsis- ICU  x3 weeks IV antibiotics/ s-p port removal.   # 2 Echo- 50% to 55%. [oct 6761]   # COPD/ not on O2; [quit 2009];    DIAGNOSIS: BLADDER CA  STAGE: Stage III;GOALS: cure  CURRENT/MOST RECENT THERAPY : Surveillance    Cancer of bladder neck (HCC)     HISTORY OF PRESENTING ILLNESS:  Steve Gilbert 69 y.o.  male with muscle invasive bladder cancer status post neoadjuvant chemotherapy-followed by cystectomy is here for follow-up.  Patient notes to have pain in his right flank/lower back.  Complains of pain radiating to the right leg. Patient initially attributed this to holster he was wearing during his trip to [place].     Denies any swelling in the legs.  Denies any nausea vomiting diarrhea headaches.     Review of Systems  Constitutional: Negative for chills, diaphoresis, fever and weight loss.  HENT: Negative for nosebleeds and sore throat.   Eyes: Negative for double vision.  Cardiovascular: Negative for chest pain, palpitations, orthopnea and leg swelling.  Gastrointestinal: Negative for abdominal pain, blood in stool, constipation, diarrhea, heartburn, melena, nausea and vomiting.  Genitourinary: Negative for dysuria, frequency and urgency.  Musculoskeletal: Positive for back pain and joint pain.   Neurological: Negative for dizziness, tingling, focal weakness, weakness and headaches.  Endo/Heme/Allergies: Does not bruise/bleed easily.  Psychiatric/Behavioral: Negative for depression. The patient is not nervous/anxious and does not have insomnia.      MEDICAL HISTORY:  Past Medical History:  Diagnosis Date  . Acid reflux   . Allergy   . Cancer of bladder neck ([place]) 02/2018   Chemo tx's and surgical resection.  Marland Kitchen COPD (chronic obstructive pulmonary disease) ([place])   . High cholesterol   . Hyperlipidemia 04/03/2018  . Hypertension   . Pneumonia     SURGICAL HISTORY: Past Surgical History:  Procedure Laterality Date  . CYSTOSCOPY W/ URETERAL STENT REMOVAL Right 08/10/2018   Procedure: CYSTOSCOPY WITH STENT REMOVAL;  Surgeon: Alexis Frock, MD;  Location: [facility];  Service: Urology;  Laterality: Right;  . CYSTOSCOPY WITH BIOPSY N/A 04/04/2018   Procedure: CYSTOSCOPY WITH TURBT;  Surgeon: Hollice Espy, MD;  Location: [facility];  Service: Urology;  Laterality: N/A;  . CYSTOSCOPY WITH URETEROSCOPY AND STENT PLACEMENT Right 04/04/2018   Procedure: CYSTOSCOPY WITH URETEROSCOPY AND STENT PLACEMENT;  Surgeon: Hollice Espy, MD;  Location: [facility];  Service: Urology;  Laterality: Right;  . NASAL SINUS SURGERY    . PORTA CATH INSERTION N/A 04/23/2018   Procedure: PORTA CATH INSERTION;  Surgeon: Algernon Huxley, MD;  Location: [place] [facility];  Service: Cardiovascular;  Laterality: N/A;  . PORTA CATH REMOVAL N/A 09/04/2018   Procedure: PORTA CATH REMOVAL;  Surgeon: Katha Cabal, MD;  Location: [place] [facility];  Service: Cardiovascular;  Laterality: N/A;  . TEE WITHOUT CARDIOVERSION N/A 09/05/2018   Procedure: TRANSESOPHAGEAL ECHOCARDIOGRAM (TEE);  Surgeon:  Minna Merritts, MD;  Location: [facility];  Service: Cardiovascular;  Laterality: N/A;  . TRANSURETHRAL RESECTION OF PROSTATE N/A 04/04/2018   Procedure: TRANSURETHRAL RESECTION OF THE PROSTATE (TURP);  Surgeon: Hollice Espy, MD;  Location: [facility];  Service: Urology;  Laterality: N/A;  . URETERAL BIOPSY Right 04/04/2018   Procedure: URETERAL BIOPSY;  Surgeon: Hollice Espy, MD;  Location: [facility];  Service: Urology;  Laterality: Right;    SOCIAL HISTORY: pt lives with his wife; currently retired.  Works part-time.  Remote history of smoking.  No alcohol. Social History   Socioeconomic History  . Marital status: Widowed    Spouse name: Not on file  . Number of children: Not on file  . Years of education: Not on file  . Highest education level: Not on file  Occupational History  . Not on file  Tobacco Use  . Smoking status: Former Smoker    Packs/day: 1.50    Years: 30.00    Pack years: 45.00    Types: Cigarettes    Quit date: 2009    Years since quitting: 12.9  . Smokeless tobacco: Former Network engineer  . Vaping Use: Never used  Substance and Sexual Activity  . Alcohol use: Yes    Alcohol/week: 7.0 standard drinks    Types: 5 Cans of beer, 2 Standard drinks or equivalent per week    Comment: ocassional   . Drug use: Not Currently  . Sexual activity: Not Currently  Other Topics Concern  . Not on file  Social History Narrative  . Not on file   Social Determinants of Health   Financial Resource Strain: Not on file  Food Insecurity: Not on file  Transportation Needs: Not on file  Physical Activity: Not on file  Stress: Not on file  Social Connections: Not on file  Intimate Partner Violence: Not on file    FAMILY HISTORY: Family History  Problem Relation Age of Onset  . Diabetes Mother   . Heart disease Father   . Diabetes Brother   . Heart attack Brother   . Heart disease Brother     ALLERGIES:  is allergic to contrast media [iodinated diagnostic agents].  MEDICATIONS:  Current Outpatient Medications  Medication Sig Dispense Refill  . amLODipine (NORVASC) 10 MG tablet Take 10 mg by mouth daily.    Marland Kitchen aspirin EC 81 MG tablet Take 81 mg by mouth daily.    Marland Kitchen  atorvastatin (LIPITOR) 20 MG tablet Take 20 mg by mouth daily.    Marland Kitchen azelastine (ASTELIN) 0.1 % nasal spray Place 1 spray into both nostrils 2 (two) times daily as needed (congestion). Use in each nostril as directed    . hydrochlorothiazide (HYDRODIURIL) 12.5 MG tablet Take 12.5 mg by mouth daily.   9  . metoprolol tartrate (LOPRESSOR) 25 MG tablet TAKE 1 TABLET(25 MG) BY MOUTH TWICE DAILY 180 tablet 1  . montelukast (SINGULAIR) 10 MG tablet Take 10 mg orally 24 hours; START 1-2 hours prior to scan x 4 days total. 4 tablet 0  . Multiple Vitamins-Minerals (MULTIVITAMIN WITH MINERALS) tablet Take 1 tablet by mouth daily.    . pantoprazole (PROTONIX) 40 MG tablet Take 40 mg by mouth every evening.     . tiotropium (SPIRIVA) 18 MCG inhalation capsule Place 18 mcg into inhaler and inhale daily as needed (congestion).     . Fluticasone-Salmeterol (ADVAIR) 250-50 MCG/DOSE AEPB Inhale 1 puff into the lungs 2 (two) times daily as needed (shortness of  breath).     . predniSONE (DELTASONE) 50 MG tablet Take 1 pill 50 mg orally 24 hours and 1-2 hours prior to scan. (Patient not taking: Reported on 07/03/2020) 2 tablet 0   No current facility-administered medications for this visit.      Marland Kitchen  PHYSICAL EXAMINATION: ECOG PERFORMANCE STATUS: 1 - Symptomatic but completely ambulatory  Vitals:   07/03/20 1035  BP: 129/84  Pulse: 63  Temp: 97.8 F (36.6 C)  SpO2: 98%   Filed Weights   07/03/20 1035  Weight: 222 lb 3.2 oz (100.8 kg)    Physical Exam Constitutional:      Comments: He is alone.  Walking himself.  HENT:     Head: Normocephalic and atraumatic.     Mouth/Throat:     Pharynx: No oropharyngeal exudate.  Eyes:     Pupils: Pupils are equal, round, and reactive to light.  Cardiovascular:     Rate and Rhythm: Normal rate and regular rhythm.  Pulmonary:     Effort: No respiratory distress.     Breath sounds: Normal breath sounds.  Abdominal:     General: Bowel sounds are normal. There  is no distension.     Palpations: Abdomen is soft. There is no mass.     Tenderness: There is no abdominal tenderness. There is no guarding or rebound.     Comments: Urostomy.   Musculoskeletal:        General: No tenderness. Normal range of motion.     Cervical back: Normal range of motion and neck supple.  Skin:    General: Skin is warm.     Comments: Mild macular rash bilateral medial thighs and buttocks.  Neurological:     Mental Status: He is alert and oriented to person, place, and time.  Psychiatric:        Mood and Affect: Affect normal.      LABORATORY DATA:  I have reviewed the data as listed Lab Results  Component Value Date   WBC 6.0 07/03/2020   HGB 16.1 07/03/2020   HCT 46.7 07/03/2020   MCV 90.7 07/03/2020   PLT 207 07/03/2020   Recent Labs    07/05/19 1003 12/26/19 0929 07/03/20 1013  NA 138 142 140  K 3.7 3.9 3.9  CL 103 100 100  CO2 26 31 32  GLUCOSE 126* 116* 139*  BUN 17 25* 20  CREATININE 1.24 1.38* 1.21  CALCIUM 8.9 9.3 9.1  GFRNONAA 59* 52* >60  GFRAA >60 >60  --   PROT 6.8 7.7 7.0  ALBUMIN 4.0 4.3 4.2  AST 26 24 30   ALT 27 30 36  ALKPHOS 77 74 69  BILITOT 0.9 0.9 1.1    RADIOGRAPHIC STUDIES: I have personally reviewed the radiological images as listed and agreed with the findings in the report. No results found.  ASSESSMENT & PLAN:   Cancer of bladder neck (HCC) #Transitional cell bladder cancer neo-adjuvant chemo- Currently s/p 4 cycles of dose dense MVAC; status post cystectomy. Last CT scan/June 9th, 2021-CT scan contrast- abdomen pelvis negative for any recurrence-question recurrence/progression based on symptoms-see below  # right flank pain [nov 2021]- worsening- ? recurrance vs MSK-recommend CT scan and pelvis ASAP.  #Allergy to dye-prescription of prednisone/Singulair given.  Recommended Pepcid/Benadryl.  # Chronic kidney disease-stage II- STABLE.   # DISPOSITION: # CT A/P ASAP # follow up TBD- Dr.B    All  questions were answered. The patient knows to call the clinic with any problems, questions or  concerns.     Cammie Sickle, MD 07/03/2020 1:36 PM

## 2020-07-03 NOTE — Assessment & Plan Note (Addendum)
#  Transitional cell bladder cancer neo-adjuvant chemo- Currently s/p 4 cycles of dose dense MVAC; status post cystectomy. Last CT scan/June 9th, 2021-CT scan contrast- abdomen pelvis negative for any recurrence-question recurrence/progression based on symptoms-see below  # right flank pain [nov 2021]- worsening- ? recurrance vs MSK-recommend CT scan and pelvis ASAP.  #Allergy to dye-prescription of prednisone/Singulair given.  Recommended Pepcid/Benadryl.  # Chronic kidney disease-stage II- STABLE.   # DISPOSITION: # CT A/P ASAP # follow up TBD- Dr.B

## 2020-07-03 NOTE — Patient Instructions (Signed)
Take the following pre-medications prior to your scan:  # Prednisone 50 mg orally 24 hours and 1-2 hours prior to scan; 2 days. [script sent]  # Singulair (Generic: montelukast) 10 mg orally 24 hours and 1-2 hours prior to scan; 4 days. [script sent]  # Benadryl 1 pill 50 mg orally 1 hour prior to scan x 4 day. [over the counter]  # Pepcid (Generic: famotidine) 20 mg orally 1 hour prior to scan; x 4 day. [over the counter]

## 2020-07-03 NOTE — Progress Notes (Signed)
Patient c/o intermittent right flank pain. He is voiding adequately into urostomy bag.

## 2020-07-06 ENCOUNTER — Telehealth: Payer: Self-pay | Admitting: *Deleted

## 2020-07-06 MED ORDER — PREDNISONE 50 MG PO TABS: ORAL_TABLET | ORAL | 0 refills | Status: DC

## 2020-07-06 NOTE — Telephone Encounter (Signed)
Patient called answering services and reports that he was seen last week and was to have had medicine called in, but it was not sent to pharmacy. I see where prescription was ordered, but printed He said that he was to start taking Prednisone today.

## 2020-07-06 NOTE — Telephone Encounter (Signed)
Prednisone resent. Pt informed.

## 2020-07-07 ENCOUNTER — Telehealth: Payer: Self-pay | Admitting: *Deleted

## 2020-07-07 MED ORDER — PREDNISONE 20 MG PO TABS: 20.0000 mg | ORAL_TABLET | Freq: Every day | ORAL | 0 refills | Status: DC

## 2020-07-07 MED ORDER — MONTELUKAST SODIUM 10 MG PO TABS: 10.0000 mg | ORAL_TABLET | Freq: Every day | ORAL | 0 refills | Status: DC

## 2020-07-07 NOTE — Telephone Encounter (Signed)
Dr. Rogue Bussing received incoming message from radiology team. Patient reports, "after injection of Iodinated contrast even with the PO Prednisone and benadryl he breaks out 3-4 days later and doesn't go away for 2 weeks.

## 2020-07-07 NOTE — Telephone Encounter (Signed)
Spoke with Dr. Rogue Bussing v/o to send prednisone 20 mg daily x 14 days along with singulair 10 mg daily x 14 days after his scans.  Patient made aware that prescriptions were sent to patient's pharmacy. He gave verbal understanding of the plan of care.

## 2020-07-08 ENCOUNTER — Inpatient Hospital Stay: Admission: RE | Admit: 2020-07-08 | Payer: PPO | Source: Ambulatory Visit

## 2020-07-08 ENCOUNTER — Ambulatory Visit
Admission: RE | Admit: 2020-07-08 | Discharge: 2020-07-08 | Disposition: A | Discharge status: Home / Self Care | Payer: PPO | Source: Ambulatory Visit | Attending: Internal Medicine | Admitting: Internal Medicine

## 2020-07-08 ENCOUNTER — Other Ambulatory Visit: Payer: Self-pay

## 2020-07-08 DIAGNOSIS — K7689 Other specified diseases of liver: Secondary | ICD-10-CM | POA: Diagnosis not present

## 2020-07-08 DIAGNOSIS — K439 Ventral hernia without obstruction or gangrene: Secondary | ICD-10-CM | POA: Diagnosis not present

## 2020-07-08 DIAGNOSIS — N2889 Other specified disorders of kidney and ureter: Secondary | ICD-10-CM | POA: Diagnosis not present

## 2020-07-08 DIAGNOSIS — K469 Unspecified abdominal hernia without obstruction or gangrene: Secondary | ICD-10-CM | POA: Diagnosis not present

## 2020-07-08 DIAGNOSIS — C675 Malignant neoplasm of bladder neck: Secondary | ICD-10-CM | POA: Diagnosis not present

## 2020-07-08 MED ORDER — IOHEXOL 300 MG/ML  SOLN
100.0000 mL | Freq: Once | INTRAMUSCULAR | Status: AC | PRN
  Administered 2020-07-08: 100 mL via INTRAVENOUS

## 2020-07-09 ENCOUNTER — Other Ambulatory Visit: Payer: PPO

## 2020-07-09 ENCOUNTER — Telehealth: Payer: Self-pay | Admitting: Internal Medicine

## 2020-07-09 ENCOUNTER — Ambulatory Visit: Admission: RE | Admit: 2020-07-09 | Payer: PPO | Source: Ambulatory Visit

## 2020-07-09 ENCOUNTER — Ambulatory Visit: Payer: PPO

## 2020-07-09 DIAGNOSIS — C675 Malignant neoplasm of bladder neck: Secondary | ICD-10-CM

## 2020-07-09 NOTE — Addendum Note (Signed)
Addended by: Delice Bison E on: 07/09/2020 09:35 AM   Modules accepted: Orders

## 2020-07-09 NOTE — Telephone Encounter (Signed)
Labs entered.

## 2020-07-09 NOTE — Telephone Encounter (Signed)
On 12/22-I spoke to patient regarding the results of the CT scan negative for any malignancy or any explanation for his back pain.  Currently back pain improved but comes and goes.  Question musculoskeletal.  With regards to protracted dye allergy-I would recommend continue Singulair/prednisone for the next 1 week.  If patient was to have worsening of the rash in the next couple of days would recommend increasing the prednisone to 60 mg a day [3 pills]; over the next 2 to 3 days and call cancer center on 12/27-for a steroid injection.  C-schedule follow-up in 2 months-MD labs CBC CMP.   FYI-Lauren/Jenny.

## 2020-07-13 NOTE — Telephone Encounter (Signed)
C-schedule follow-up in 2 months-MD labs CBC CMP.    Colette - please schedule patient for follow-ups

## 2020-07-28 DIAGNOSIS — C678 Malignant neoplasm of overlapping sites of bladder: Secondary | ICD-10-CM | POA: Diagnosis not present

## 2020-07-28 DIAGNOSIS — Z936 Other artificial openings of urinary tract status: Secondary | ICD-10-CM | POA: Diagnosis not present

## 2020-07-28 DIAGNOSIS — C775 Secondary and unspecified malignant neoplasm of intrapelvic lymph nodes: Secondary | ICD-10-CM | POA: Diagnosis not present

## 2020-07-28 DIAGNOSIS — N130 Hydronephrosis with ureteropelvic junction obstruction: Secondary | ICD-10-CM | POA: Diagnosis not present

## 2020-07-28 DIAGNOSIS — A4901 Methicillin susceptible Staphylococcus aureus infection, unspecified site: Secondary | ICD-10-CM | POA: Diagnosis not present

## 2020-07-28 DIAGNOSIS — R7881 Bacteremia: Secondary | ICD-10-CM | POA: Diagnosis not present

## 2020-08-17 ENCOUNTER — Other Ambulatory Visit: Payer: Self-pay | Admitting: Internal Medicine

## 2020-09-03 ENCOUNTER — Encounter: Payer: Self-pay | Admitting: Internal Medicine

## 2020-09-03 ENCOUNTER — Inpatient Hospital Stay: Payer: PPO | Attending: Internal Medicine

## 2020-09-03 ENCOUNTER — Inpatient Hospital Stay: Payer: PPO | Admitting: Internal Medicine

## 2020-09-03 DIAGNOSIS — I10 Essential (primary) hypertension: Secondary | ICD-10-CM | POA: Insufficient documentation

## 2020-09-03 DIAGNOSIS — Z87891 Personal history of nicotine dependence: Secondary | ICD-10-CM | POA: Insufficient documentation

## 2020-09-03 DIAGNOSIS — E7800 Pure hypercholesterolemia, unspecified: Secondary | ICD-10-CM | POA: Insufficient documentation

## 2020-09-03 DIAGNOSIS — C675 Malignant neoplasm of bladder neck: Secondary | ICD-10-CM

## 2020-09-03 DIAGNOSIS — J449 Chronic obstructive pulmonary disease, unspecified: Secondary | ICD-10-CM | POA: Insufficient documentation

## 2020-09-03 DIAGNOSIS — Z7982 Long term (current) use of aspirin: Secondary | ICD-10-CM | POA: Diagnosis not present

## 2020-09-03 DIAGNOSIS — Z9221 Personal history of antineoplastic chemotherapy: Secondary | ICD-10-CM | POA: Insufficient documentation

## 2020-09-03 DIAGNOSIS — Z79899 Other long term (current) drug therapy: Secondary | ICD-10-CM | POA: Diagnosis not present

## 2020-09-03 DIAGNOSIS — E785 Hyperlipidemia, unspecified: Secondary | ICD-10-CM | POA: Insufficient documentation

## 2020-09-03 DIAGNOSIS — C775 Secondary and unspecified malignant neoplasm of intrapelvic lymph nodes: Secondary | ICD-10-CM | POA: Diagnosis not present

## 2020-09-03 DIAGNOSIS — Z936 Other artificial openings of urinary tract status: Secondary | ICD-10-CM | POA: Diagnosis not present

## 2020-09-03 DIAGNOSIS — A4901 Methicillin susceptible Staphylococcus aureus infection, unspecified site: Secondary | ICD-10-CM | POA: Diagnosis not present

## 2020-09-03 DIAGNOSIS — C678 Malignant neoplasm of overlapping sites of bladder: Secondary | ICD-10-CM | POA: Diagnosis not present

## 2020-09-03 DIAGNOSIS — R7881 Bacteremia: Secondary | ICD-10-CM | POA: Diagnosis not present

## 2020-09-03 DIAGNOSIS — N130 Hydronephrosis with ureteropelvic junction obstruction: Secondary | ICD-10-CM | POA: Diagnosis not present

## 2020-09-03 LAB — CBC WITH DIFFERENTIAL/PLATELET
Abs Immature Granulocytes: 0.03 10*3/uL (ref 0.00–0.07)
Basophils Absolute: 0.1 10*3/uL (ref 0.0–0.1)
Basophils Relative: 1 %
Eosinophils Absolute: 0.2 10*3/uL (ref 0.0–0.5)
Eosinophils Relative: 4 %
HCT: 45.3 % (ref 39.0–52.0)
Hemoglobin: 15.7 g/dL (ref 13.0–17.0)
Immature Granulocytes: 1 %
Lymphocytes Relative: 20 %
Lymphs Abs: 1.1 10*3/uL (ref 0.7–4.0)
MCH: 31.5 pg (ref 26.0–34.0)
MCHC: 34.7 g/dL (ref 30.0–36.0)
MCV: 90.8 fL (ref 80.0–100.0)
Monocytes Absolute: 0.7 10*3/uL (ref 0.1–1.0)
Monocytes Relative: 14 %
Neutro Abs: 3.4 10*3/uL (ref 1.7–7.7)
Neutrophils Relative %: 60 %
Platelets: 205 10*3/uL (ref 150–400)
RBC: 4.99 MIL/uL (ref 4.22–5.81)
RDW: 13.4 % (ref 11.5–15.5)
WBC: 5.5 10*3/uL (ref 4.0–10.5)
nRBC: 0 % (ref 0.0–0.2)

## 2020-09-03 LAB — COMPREHENSIVE METABOLIC PANEL
ALT: 29 U/L (ref 0–44)
AST: 24 U/L (ref 15–41)
Albumin: 4.1 g/dL (ref 3.5–5.0)
Alkaline Phosphatase: 65 U/L (ref 38–126)
Anion gap: 9 (ref 5–15)
BUN: 18 mg/dL (ref 8–23)
CO2: 29 mmol/L (ref 22–32)
Calcium: 9.3 mg/dL (ref 8.9–10.3)
Chloride: 104 mmol/L (ref 98–111)
Creatinine, Ser: 1.28 mg/dL — ABNORMAL HIGH (ref 0.61–1.24)
GFR, Estimated: >60 mL/min (ref >60)
Glucose, Bld: 109 mg/dL — ABNORMAL HIGH (ref 70–99)
Potassium: 5 mmol/L (ref 3.5–5.1)
Sodium: 142 mmol/L (ref 135–145)
Total Bilirubin: 1.1 mg/dL (ref 0.3–1.2)
Total Protein: 7.1 g/dL (ref 6.5–8.1)

## 2020-09-03 NOTE — Progress Notes (Signed)
[place] NOTE  Patient Care Team: Kirk Ruths, MD as PCP - General (Internal Medicine) Hollice Espy, MD as Consulting Physician (Urology) Cammie Sickle, MD as Consulting Physician (Internal Medicine)  CHIEF COMPLAINTS/PURPOSE OF CONSULTATION:  Bladder cancer  #  Oncology History Overview Note  # BLADDER CANCER: pT2/cpT4  prostate/bladder neck Dr.Brandon TURP s/p  - INVASIVE UROTHELIAL CARCINOMA, HIGH-GRADE;  TUMOR INVADES BLADDER NECK MUSCULARIS PROPRIA. Mild-mod Right hydronephrosis [facility] RISK FEATURES]   # OCt 9t ddMVAC x4; Jan 24th cystectomy [Dr.Manny; [place]] ypTis ypN1; stage III suriveillaince  # Feb 2020- UTI/sepsis- ICU  x3 weeks IV antibiotics/ s-p port removal.   # 2 Echo- 50% to 55%. [oct 9381]   # COPD/ not on O2; [quit 2009];    DIAGNOSIS: BLADDER CA  STAGE: Stage III;GOALS: cure  CURRENT/MOST RECENT THERAPY : Surveillance    Cancer of bladder neck (HCC)     HISTORY OF PRESENTING ILLNESS:  Steve Gilbert 70 y.o.  male with muscle invasive bladder cancer status post neoadjuvant chemotherapy-followed by cystectomy is here for follow-up/review results of CT scan.  CT scan was ordered for worsening right flank pain.  Patient continues to have right flank pain.  Otherwise denies any significant worsening.  Denies any pain anywhere else.  No appetite loss or weight loss.  No worsening shortness of breath or chest pain.  Review of Systems  Constitutional: Negative for chills, diaphoresis, fever and weight loss.  HENT: Negative for nosebleeds and sore throat.   Eyes: Negative for double vision.  Cardiovascular: Negative for chest pain, palpitations, orthopnea and leg swelling.  Gastrointestinal: Negative for abdominal pain, blood in stool, constipation, diarrhea, heartburn, melena, nausea and vomiting.  Genitourinary: Negative for dysuria, frequency and urgency.  Musculoskeletal: Positive for back pain and joint pain.   Neurological: Negative for dizziness, tingling, focal weakness, weakness and headaches.  Endo/Heme/Allergies: Does not bruise/bleed easily.  Psychiatric/Behavioral: Negative for depression. The patient is not nervous/anxious and does not have insomnia.      MEDICAL HISTORY:  Past Medical History:  Diagnosis Date  . Acid reflux   . Allergy   . Cancer of bladder neck ([place]) 02/2018   Chemo tx's and surgical resection.  Marland Kitchen COPD (chronic obstructive pulmonary disease) ([place])   . High cholesterol   . Hyperlipidemia 04/03/2018  . Hypertension   . Pneumonia     SURGICAL HISTORY: Past Surgical History:  Procedure Laterality Date  . CYSTOSCOPY W/ URETERAL STENT REMOVAL Right 08/10/2018   Procedure: CYSTOSCOPY WITH STENT REMOVAL;  Surgeon: Alexis Frock, MD;  [facility]: [facility];  Service: Urology;  Laterality: Right;  . CYSTOSCOPY WITH BIOPSY N/A 04/04/2018   Procedure: CYSTOSCOPY WITH TURBT;  Surgeon: Hollice Espy, MD;  Location: [facility];  Service: Urology;  Laterality: N/A;  . CYSTOSCOPY WITH URETEROSCOPY AND STENT PLACEMENT Right 04/04/2018   Procedure: CYSTOSCOPY WITH URETEROSCOPY AND STENT PLACEMENT;  Surgeon: Hollice Espy, MD;  Location: [facility];  Service: Urology;  Laterality: Right;  . NASAL SINUS SURGERY    . PORTA CATH INSERTION N/A 04/23/2018   Procedure: PORTA CATH INSERTION;  Surgeon: Algernon Huxley, MD;  Location: [place] [facility];  Service: Cardiovascular;  Laterality: N/A;  . PORTA CATH REMOVAL N/A 09/04/2018   Procedure: PORTA CATH REMOVAL;  Surgeon: Katha Cabal, MD;  Location: [place] [facility];  Service: Cardiovascular;  Laterality: N/A;  . TEE WITHOUT CARDIOVERSION N/A 09/05/2018   Procedure: TRANSESOPHAGEAL ECHOCARDIOGRAM (TEE);  Surgeon: Minna Merritts, MD;  Location: [facility];  Service: Cardiovascular;  Laterality: N/A;  . TRANSURETHRAL RESECTION OF PROSTATE N/A 04/04/2018   Procedure: TRANSURETHRAL RESECTION OF THE PROSTATE (TURP);  Surgeon: Hollice Espy, MD;  Location: [facility];  Service: Urology;  Laterality: N/A;  . URETERAL BIOPSY Right 04/04/2018   Procedure: URETERAL BIOPSY;  Surgeon: Hollice Espy, MD;  Location: [facility];  Service: Urology;  Laterality: Right;     SOCIAL HISTORY:  Social History   Socioeconomic History  . Marital status: Widowed    Spouse name: Not on file  . Number of children: Not on file  . Years of education: Not on file  . Highest education level: Not on file  Occupational History  . Not on file  Tobacco Use  . Smoking status: Former Smoker    Packs/day: 1.50    Years: 30.00    Pack years: 45.00    Types: Cigarettes    Quit date: 2009    Years since quitting: 13.1  . Smokeless tobacco: Former Network engineer  . Vaping Use: Never used  Substance and Sexual Activity  . Alcohol use: Yes    Alcohol/week: 7.0 standard drinks    Types: 5 Cans of beer, 2 Standard drinks or equivalent per week    Comment: ocassional   . Drug use: Not Currently  . Sexual activity: Not Currently  Other Topics Concern  . Not on file  Social History Narrative   currently retired.  Works part-time.  Remote history of smoking.  No alcohol.   Social Determinants of Health   Financial Resource Strain: Not on file  Food Insecurity: Not on file  Transportation Needs: Not on file  Physical Activity: Not on file  Stress: Not on file  Social Connections: Not on file  Intimate Partner Violence: Not on file    FAMILY HISTORY: Family History  Problem Relation Age of Onset  . Diabetes Mother   . Heart disease Father   . Diabetes Brother   . Heart attack Brother   . Heart disease Brother     ALLERGIES:  is allergic to contrast media [iodinated diagnostic agents].  MEDICATIONS:  Current Outpatient Medications  Medication Sig Dispense Refill  . amLODipine (NORVASC) 10 MG tablet Take 10 mg by mouth daily.    Marland Kitchen aspirin EC 81 MG tablet Take 81 mg by mouth daily.    Marland Kitchen atorvastatin (LIPITOR) 20 MG tablet Take  20 mg by mouth daily.    Marland Kitchen azelastine (ASTELIN) 0.1 % nasal spray Place 1 spray into both nostrils 2 (two) times daily as needed (congestion). Use in each nostril as directed    . Fluticasone-Salmeterol (ADVAIR) 250-50 MCG/DOSE AEPB Inhale 1 puff into the lungs 2 (two) times daily as needed (shortness of breath).     . hydrochlorothiazide (HYDRODIURIL) 12.5 MG tablet Take 12.5 mg by mouth daily.   9  . metoprolol tartrate (LOPRESSOR) 25 MG tablet TAKE 1 TABLET(25 MG) BY MOUTH TWICE DAILY 180 tablet 1  . Multiple Vitamins-Minerals (MULTIVITAMIN WITH MINERALS) tablet Take 1 tablet by mouth daily.    . pantoprazole (PROTONIX) 40 MG tablet Take 40 mg by mouth every evening.     . tiotropium (SPIRIVA) 18 MCG inhalation capsule Place 18 mcg into inhaler and inhale daily as needed (congestion).      No current facility-administered medications for this visit.      Marland Kitchen  PHYSICAL EXAMINATION: ECOG PERFORMANCE STATUS: 1 - Symptomatic but completely ambulatory  Vitals:   09/03/20 1015  BP: 131/73  Pulse: 62  Resp: 16  Temp: 97.6 F (36.4 C)  SpO2: 99%   Filed Weights   09/03/20 1015  Weight: 220 lb (99.8 kg)    Physical Exam Constitutional:      Comments: He is alone.  Walking himself.  HENT:     Head: Normocephalic and atraumatic.     Mouth/Throat:     Pharynx: No oropharyngeal exudate.  Eyes:     Pupils: Pupils are equal, round, and reactive to light.  Cardiovascular:     Rate and Rhythm: Normal rate and regular rhythm.  Pulmonary:     Effort: No respiratory distress.     Breath sounds: Normal breath sounds.  Abdominal:     General: Bowel sounds are normal. There is no distension.     Palpations: Abdomen is soft. There is no mass.     Tenderness: There is no abdominal tenderness. There is no guarding or rebound.     Comments: Urostomy.   Musculoskeletal:        General: No tenderness. Normal range of motion.     Cervical back: Normal range of motion and neck supple.   Skin:    General: Skin is warm.     Comments: Mild macular rash bilateral medial thighs and buttocks.  Neurological:     Mental Status: He is alert and oriented to person, place, and time.  Psychiatric:        Mood and Affect: Affect normal.      LABORATORY DATA:  I have reviewed the data as listed Lab Results  Component Value Date   WBC 5.5 09/03/2020   HGB 15.7 09/03/2020   HCT 45.3 09/03/2020   MCV 90.8 09/03/2020   PLT 205 09/03/2020   Recent Labs    12/26/19 0929 07/03/20 1013 09/03/20 1004  NA 142 140 142  K 3.9 3.9 5.0  CL 100 100 104  CO2 31 32 29  GLUCOSE 116* 139* 109*  BUN 25* 20 18  CREATININE 1.38* 1.21 1.28*  CALCIUM 9.3 9.1 9.3  GFRNONAA 52* >60 >60  GFRAA >60  --   --   PROT 7.7 7.0 7.1  ALBUMIN 4.3 4.2 4.1  AST 24 30 24   ALT 30 36 29  ALKPHOS 74 69 65  BILITOT 0.9 1.1 1.1    RADIOGRAPHIC STUDIES: I have personally reviewed the radiological images as listed and agreed with the findings in the report. No results found.  ASSESSMENT & PLAN:   Cancer of bladder neck (HCC) #Transitional cell bladder cancer neo-adjuvant chemo- Currently s/p 4 cycles of dose dense MVAC; status post cystectomy. DEC 22nd, 2021- No acute findings identified within the abdomen or pelvis;  S/p cystoprostatectomy with diverting ileal conduit. No signs of recurrent or metastatic disease within the abdomen or pelvis. STABLE.   # right flank pain [nov 2021]-stable.  Likely musculoskeletal.  Defer to PCP.  # Allergy to dye-prescription of prednisone/Singulair/ Pepcid/Benadryl.  Stable.  # Chronic kidney disease-stage II- STABLE.   # DISPOSITION: # follow up in 6 months/labs- cbc/cmp-Dr.B  # I reviewed the blood work- with the patient in detail; also reviewed the imaging independently [as summarized above]; and with the patient in detail.       All questions were answered. The patient knows to call the clinic with any problems, questions or concerns.     Cammie Sickle, MD 09/03/2020 1:29 PM

## 2020-09-03 NOTE — Assessment & Plan Note (Addendum)
#  Transitional cell bladder cancer neo-adjuvant chemo- Currently s/p 4 cycles of dose dense MVAC; status post cystectomy. DEC 22nd, 2021- No acute findings identified within the abdomen or pelvis;  S/p cystoprostatectomy with diverting ileal conduit. No signs of recurrent or metastatic disease within the abdomen or pelvis. STABLE.   # right flank pain [nov 2021]-stable.  Likely musculoskeletal.  Defer to PCP.  # Allergy to dye-prescription of prednisone/Singulair/ Pepcid/Benadryl.  Stable.  # Chronic kidney disease-stage II- STABLE.   # DISPOSITION: # follow up in 6 months/labs- cbc/cmp-Dr.B  # I reviewed the blood work- with the patient in detail; also reviewed the imaging independently [as summarized above]; and with the patient in detail.

## 2020-09-03 NOTE — Progress Notes (Signed)
Has had what he thinks is sciatic nerve pain for about a month. Primary R side

## 2020-09-10 DIAGNOSIS — R7303 Prediabetes: Secondary | ICD-10-CM | POA: Diagnosis not present

## 2020-09-10 DIAGNOSIS — E7800 Pure hypercholesterolemia, unspecified: Secondary | ICD-10-CM | POA: Diagnosis not present

## 2020-09-10 DIAGNOSIS — J449 Chronic obstructive pulmonary disease, unspecified: Secondary | ICD-10-CM | POA: Diagnosis not present

## 2020-09-10 DIAGNOSIS — I10 Essential (primary) hypertension: Secondary | ICD-10-CM | POA: Diagnosis not present

## 2020-10-05 DIAGNOSIS — J449 Chronic obstructive pulmonary disease, unspecified: Secondary | ICD-10-CM | POA: Diagnosis not present

## 2020-10-05 DIAGNOSIS — J40 Bronchitis, not specified as acute or chronic: Secondary | ICD-10-CM | POA: Diagnosis not present

## 2020-10-08 DIAGNOSIS — Z936 Other artificial openings of urinary tract status: Secondary | ICD-10-CM | POA: Diagnosis not present

## 2020-10-27 DIAGNOSIS — N130 Hydronephrosis with ureteropelvic junction obstruction: Secondary | ICD-10-CM | POA: Diagnosis not present

## 2020-10-27 DIAGNOSIS — Z936 Other artificial openings of urinary tract status: Secondary | ICD-10-CM | POA: Diagnosis not present

## 2020-10-27 DIAGNOSIS — A4901 Methicillin susceptible Staphylococcus aureus infection, unspecified site: Secondary | ICD-10-CM | POA: Diagnosis not present

## 2020-10-27 DIAGNOSIS — C775 Secondary and unspecified malignant neoplasm of intrapelvic lymph nodes: Secondary | ICD-10-CM | POA: Diagnosis not present

## 2020-10-27 DIAGNOSIS — C678 Malignant neoplasm of overlapping sites of bladder: Secondary | ICD-10-CM | POA: Diagnosis not present

## 2020-10-27 DIAGNOSIS — R7881 Bacteremia: Secondary | ICD-10-CM | POA: Diagnosis not present

## 2021-01-07 DIAGNOSIS — N130 Hydronephrosis with ureteropelvic junction obstruction: Secondary | ICD-10-CM | POA: Diagnosis not present

## 2021-01-07 DIAGNOSIS — Z936 Other artificial openings of urinary tract status: Secondary | ICD-10-CM | POA: Diagnosis not present

## 2021-01-07 DIAGNOSIS — C775 Secondary and unspecified malignant neoplasm of intrapelvic lymph nodes: Secondary | ICD-10-CM | POA: Diagnosis not present

## 2021-01-07 DIAGNOSIS — R7881 Bacteremia: Secondary | ICD-10-CM | POA: Diagnosis not present

## 2021-01-07 DIAGNOSIS — A4901 Methicillin susceptible Staphylococcus aureus infection, unspecified site: Secondary | ICD-10-CM | POA: Diagnosis not present

## 2021-01-07 DIAGNOSIS — C678 Malignant neoplasm of overlapping sites of bladder: Secondary | ICD-10-CM | POA: Diagnosis not present

## 2021-02-21 ENCOUNTER — Other Ambulatory Visit: Payer: Self-pay | Admitting: Internal Medicine

## 2021-02-22 ENCOUNTER — Encounter: Payer: Self-pay | Admitting: Internal Medicine

## 2021-02-22 NOTE — Telephone Encounter (Signed)
Dr. Marquis Buggy please advise on RF on metoprolol. You will see patient next week. Should pcp manage bp meds?

## 2021-03-04 ENCOUNTER — Inpatient Hospital Stay: Payer: PPO | Admitting: Internal Medicine

## 2021-03-04 ENCOUNTER — Other Ambulatory Visit: Payer: Self-pay

## 2021-03-04 ENCOUNTER — Inpatient Hospital Stay: Payer: PPO | Attending: Internal Medicine

## 2021-03-04 ENCOUNTER — Encounter: Payer: Self-pay | Admitting: Internal Medicine

## 2021-03-04 VITALS — BP 127/86 | HR 60 | Temp 96.3°F | Resp 20 | Ht 67.0 in | Wt 219.0 lb

## 2021-03-04 DIAGNOSIS — Z87891 Personal history of nicotine dependence: Secondary | ICD-10-CM | POA: Diagnosis not present

## 2021-03-04 DIAGNOSIS — Z79899 Other long term (current) drug therapy: Secondary | ICD-10-CM | POA: Diagnosis not present

## 2021-03-04 DIAGNOSIS — C675 Malignant neoplasm of bladder neck: Secondary | ICD-10-CM

## 2021-03-04 DIAGNOSIS — N182 Chronic kidney disease, stage 2 (mild): Secondary | ICD-10-CM | POA: Insufficient documentation

## 2021-03-04 DIAGNOSIS — Z9079 Acquired absence of other genital organ(s): Secondary | ICD-10-CM | POA: Diagnosis not present

## 2021-03-04 LAB — CBC WITH DIFFERENTIAL/PLATELET
Abs Immature Granulocytes: 0.05 10*3/uL (ref 0.00–0.07)
Basophils Absolute: 0.1 10*3/uL (ref 0.0–0.1)
Basophils Relative: 1 %
Eosinophils Absolute: 0.3 10*3/uL (ref 0.0–0.5)
Eosinophils Relative: 5 %
HCT: 46.8 % (ref 39.0–52.0)
Hemoglobin: 16.3 g/dL (ref 13.0–17.0)
Immature Granulocytes: 1 %
Lymphocytes Relative: 17 %
Lymphs Abs: 1.1 10*3/uL (ref 0.7–4.0)
MCH: 31 pg (ref 26.0–34.0)
MCHC: 34.8 g/dL (ref 30.0–36.0)
MCV: 89 fL (ref 80.0–100.0)
Monocytes Absolute: 0.8 10*3/uL (ref 0.1–1.0)
Monocytes Relative: 12 %
Neutro Abs: 4.3 10*3/uL (ref 1.7–7.7)
Neutrophils Relative %: 64 %
Platelets: 263 10*3/uL (ref 150–400)
RBC: 5.26 MIL/uL (ref 4.22–5.81)
RDW: 12.8 % (ref 11.5–15.5)
WBC: 6.6 10*3/uL (ref 4.0–10.5)
nRBC: 0 % (ref 0.0–0.2)

## 2021-03-04 LAB — COMPREHENSIVE METABOLIC PANEL
ALT: 28 U/L (ref 0–44)
AST: 29 U/L (ref 15–41)
Albumin: 4.1 g/dL (ref 3.5–5.0)
Alkaline Phosphatase: 72 U/L (ref 38–126)
Anion gap: 8 (ref 5–15)
BUN: 21 mg/dL (ref 8–23)
CO2: 31 mmol/L (ref 22–32)
Calcium: 9.2 mg/dL (ref 8.9–10.3)
Chloride: 101 mmol/L (ref 98–111)
Creatinine, Ser: 1.37 mg/dL — ABNORMAL HIGH (ref 0.61–1.24)
GFR, Estimated: 55 mL/min — ABNORMAL LOW (ref >60)
Glucose, Bld: 113 mg/dL — ABNORMAL HIGH (ref 70–99)
Potassium: 4.1 mmol/L (ref 3.5–5.1)
Sodium: 140 mmol/L (ref 135–145)
Total Bilirubin: 0.9 mg/dL (ref 0.3–1.2)
Total Protein: 7.5 g/dL (ref 6.5–8.1)

## 2021-03-04 NOTE — Assessment & Plan Note (Addendum)
#  Transitional cell bladder cancer neo-adjuvant chemo-s/p 4 cycles of dose dense MVAC; status post cystectomy. DEC 22nd, 2021- No acute findings identified within the abdomen or pelvis;  S/p cystoprostatectomy with diverting ileal conduit. No signs of recurrent or metastatic disease within the abdomen or pelvis. STABLE.  We will plan to get a CT scan abdomen pelvis no contrast/next visit in 6 months.  Ordered.  # Allergy to dye-prescription of prednisone/Singulair/ Pepcid/Benadryl.  STABLE.   # Chronic kidney disease-stage II-stable  #  Discussed importance of healthy weight/and weight loss.  Strongly recommend eating more green leafy vegetables and cutting down processed food/ carbohydrates [pasta/eating out].  Instead increasing whole grains / protein in the diet.  Multiple studies have shown that optimal weight would help improve cardiovascular risk.   # DISPOSITION: # follow up in 6 months/labs- cbc/cmp; CT A/P prior--Dr.B

## 2021-03-04 NOTE — Progress Notes (Signed)
[facility] NOTE  Patient Care Team: Kirk Ruths, MD as PCP - General (Internal Medicine) Hollice Espy, MD as Consulting Physician (Urology) Cammie Sickle, MD as Consulting Physician (Internal Medicine)  CHIEF COMPLAINTS/PURPOSE OF CONSULTATION:  Bladder cancer  #  Oncology History Overview Note  # BLADDER CANCER: pT2/cpT4  prostate/bladder neck Dr.Brandon TURP s/p  - INVASIVE UROTHELIAL CARCINOMA, HIGH-GRADE;  TUMOR INVADES BLADDER NECK MUSCULARIS PROPRIA. Mild-mod Right hydronephrosis [facility] RISK FEATURES]   # OCt 9t ddMVAC x4; Jan 24th cystectomy [Dr.Manny; [place]] ypTis ypN1; stage III suriveillaince  # Feb 2020- UTI/sepsis- ICU  x3 weeks IV antibiotics/ s-p port removal.   # 2 Echo- 50% to 55%. [oct [id number]  # COPD/ not on O2; [quit 2009];    DIAGNOSIS: BLADDER CA  STAGE: Stage III;GOALS: cure  CURRENT/MOST RECENT THERAPY : Surveillance    Cancer of bladder neck (HCC)     HISTORY OF PRESENTING ILLNESS:  Steve Gilbert 70 y.o.  male with muscle invasive bladder cancer status post neoadjuvant chemotherapy-followed by cystectomy currently on surveillance is here for follow-up/.  Patient denies any new onset of back pain or joint pains.  Shortness of breath or cough no bone pain.  No nausea no vomiting.  Admits to weight gain over the last many years.  No worsening shortness of breath or chest pain.  Review of Systems  Constitutional:  Negative for chills, diaphoresis, fever and weight loss.  HENT:  Negative for nosebleeds and sore throat.   Eyes:  Negative for double vision.  Cardiovascular:  Negative for chest pain, palpitations, orthopnea and leg swelling.  Gastrointestinal:  Negative for abdominal pain, blood in stool, constipation, diarrhea, heartburn, melena, nausea and vomiting.  Genitourinary:  Negative for dysuria, frequency and urgency.  Musculoskeletal:  Positive for back pain and joint pain.  Neurological:  Negative  for dizziness, tingling, focal weakness, weakness and headaches.  Endo/Heme/Allergies:  Does not bruise/bleed easily.  Psychiatric/Behavioral:  Negative for depression. The patient is not nervous/anxious and does not have insomnia.     MEDICAL HISTORY:  Past Medical History:  Diagnosis Date  . Acid reflux   . Allergy   . Cancer of bladder neck ([place]) 02/2018   Chemo tx's and surgical resection.  Marland Kitchen COPD (chronic obstructive pulmonary disease) ([place])   . High cholesterol   . Hyperlipidemia 04/03/2018  . Hypertension   . Pneumonia     SURGICAL HISTORY: Past Surgical History:  Procedure Laterality Date  . CYSTOSCOPY W/ URETERAL STENT REMOVAL Right 08/10/2018   Procedure: CYSTOSCOPY WITH STENT REMOVAL;  Surgeon: Alexis Frock, MD;  Location: [facility];  Service: Urology;  Laterality: Right;  . CYSTOSCOPY WITH BIOPSY N/A 04/04/2018   Procedure: CYSTOSCOPY WITH TURBT;  Surgeon: Hollice Espy, MD;  Location: [facility];  Service: Urology;  Laterality: N/A;  . CYSTOSCOPY WITH URETEROSCOPY AND STENT PLACEMENT Right 04/04/2018   Procedure: CYSTOSCOPY WITH URETEROSCOPY AND STENT PLACEMENT;  Surgeon: Hollice Espy, MD;  Location: [facility];  Service: Urology;  Laterality: Right;  . NASAL SINUS SURGERY    . PORTA CATH INSERTION N/A 04/23/2018   Procedure: PORTA CATH INSERTION;  Surgeon: Algernon Huxley, MD;  Location: [facility];  Service: Cardiovascular;  Laterality: N/A;  . PORTA CATH REMOVAL N/A 09/04/2018   Procedure: PORTA CATH REMOVAL;  Surgeon: Katha Cabal, MD;  Location: [facility];  Service: Cardiovascular;  Laterality: N/A;  . TEE WITHOUT CARDIOVERSION N/A 09/05/2018   Procedure: TRANSESOPHAGEAL ECHOCARDIOGRAM (TEE);  Surgeon: Minna Merritts, MD;  Location: [facility];  Service: Cardiovascular;  Laterality: N/A;  . TRANSURETHRAL RESECTION OF PROSTATE N/A 04/04/2018   Procedure: TRANSURETHRAL RESECTION OF THE PROSTATE (TURP);  Surgeon: Hollice Espy, MD;  Location: [facility]  [facility];  Service: Urology;  Laterality: N/A;  . URETERAL BIOPSY Right 04/04/2018   Procedure: URETERAL BIOPSY;  Surgeon: Hollice Espy, MD;  Location: [facility];  Service: Urology;  Laterality: Right;     SOCIAL HISTORY:  Social History   Socioeconomic History  . Marital status: Widowed    Spouse name: Not on file  . Number of children: Not on file  . Years of education: Not on file  . Highest education level: Not on file  Occupational History  . Not on file  Tobacco Use  . Smoking status: Former    Packs/day: 1.50    Years: 30.00    Pack years: 45.00    Types: Cigarettes    Quit date: 2009    Years since quitting: 13.6  . Smokeless tobacco: Former  Media planner  . Vaping Use: Never used  Substance and Sexual Activity  . Alcohol use: Yes    Alcohol/week: 7.0 standard drinks    Types: 5 Cans of beer, 2 Standard drinks or equivalent per week    Comment: ocassional   . Drug use: Not Currently  . Sexual activity: Not Currently  Other Topics Concern  . Not on file  Social History Narrative   currently retired.  Works part-time.  Remote history of smoking.  No alcohol.   Social Determinants of Health   Financial Resource Strain: Not on file  Food Insecurity: Not on file  Transportation Needs: Not on file  Physical Activity: Not on file  Stress: Not on file  Social Connections: Not on file  Intimate Partner Violence: Not on file    FAMILY HISTORY: Family History  Problem Relation Age of Onset  . Diabetes Mother   . Heart disease Father   . Diabetes Brother   . Heart attack Brother   . Heart disease Brother     ALLERGIES:  is allergic to contrast media [iodinated diagnostic agents].  MEDICATIONS:  Current Outpatient Medications  Medication Sig Dispense Refill  . amLODipine (NORVASC) 10 MG tablet Take 10 mg by mouth daily.    Marland Kitchen aspirin EC 81 MG tablet Take 81 mg by mouth daily.    Marland Kitchen atorvastatin (LIPITOR) 20 MG tablet Take 20 mg by mouth daily.    Marland Kitchen azelastine  (ASTELIN) 0.1 % nasal spray Place 1 spray into both nostrils 2 (two) times daily as needed (congestion). Use in each nostril as directed    . fluticasone-salmeterol (WIXELA INHUB) 250-50 MCG/ACT AEPB Inhale 1 puff into the lungs in the morning and at bedtime.    . hydrochlorothiazide (HYDRODIURIL) 12.5 MG tablet Take 12.5 mg by mouth daily.   9  . metoprolol tartrate (LOPRESSOR) 25 MG tablet TAKE 1 TABLET(25 MG) BY MOUTH TWICE DAILY 180 tablet 1  . Multiple Vitamins-Minerals (MULTIVITAMIN WITH MINERALS) tablet Take 1 tablet by mouth daily.    . pantoprazole (PROTONIX) 40 MG tablet Take 40 mg by mouth every evening.     . tiotropium (SPIRIVA) 18 MCG inhalation capsule Place 18 mcg into inhaler and inhale daily as needed (congestion).     Marland Kitchen tiotropium (SPIRIVA HANDIHALER) 18 MCG inhalation capsule Spiriva with HandiHaler 18 mcg and inhalation capsules  PLACE 1 CAPSULE INTO INHALER AND INHALE ONCE DAILY     No  current facility-administered medications for this visit.      Marland Kitchen  PHYSICAL EXAMINATION: ECOG PERFORMANCE STATUS: 1 - Symptomatic but completely ambulatory  Vitals:   03/04/21 1043  BP: 127/86  Pulse: 60  Resp: 20  Temp: (!) 96.3 F (35.7 C)   Filed Weights   03/04/21 1043  Weight: 219 lb (99.3 kg)    Physical Exam Constitutional:      Comments: He is alone.  Walking himself.  HENT:     Head: Normocephalic and atraumatic.     Mouth/Throat:     Pharynx: No oropharyngeal exudate.  Eyes:     Pupils: Pupils are equal, round, and reactive to light.  Cardiovascular:     Rate and Rhythm: Normal rate and regular rhythm.  Pulmonary:     Effort: No respiratory distress.     Breath sounds: Normal breath sounds.  Abdominal:     General: Bowel sounds are normal. There is no distension.     Palpations: Abdomen is soft. There is no mass.     Tenderness: There is no abdominal tenderness. There is no guarding or rebound.     Comments: Urostomy.   Musculoskeletal:        General:  No tenderness. Normal range of motion.     Cervical back: Normal range of motion and neck supple.  Skin:    General: Skin is warm.     Comments: Mild macular rash bilateral medial thighs and buttocks.  Neurological:     Mental Status: He is alert and oriented to person, place, and time.  Psychiatric:        Mood and Affect: Affect normal.     LABORATORY DATA:  I have reviewed the data as listed Lab Results  Component Value Date   WBC 6.6 03/04/2021   HGB 16.3 03/04/2021   HCT 46.8 03/04/2021   MCV 89.0 03/04/2021   PLT 263 03/04/2021   Recent Labs    07/03/20 1013 09/03/20 1004 03/04/21 1015  NA 140 142 140  K 3.9 5.0 4.1  CL 100 104 101  CO2 32 29 31  GLUCOSE 139* 109* 113*  BUN '20 18 21  '$ CREATININE 1.21 1.28* 1.37*  CALCIUM 9.1 9.3 9.2  GFRNONAA >60 >60 55*  PROT 7.0 7.1 7.5  ALBUMIN 4.2 4.1 4.1  AST '30 24 29  '$ ALT 36 29 28  ALKPHOS 69 65 72  BILITOT 1.1 1.1 0.9    RADIOGRAPHIC STUDIES: I have personally reviewed the radiological images as listed and agreed with the findings in the report. No results found.  ASSESSMENT & PLAN:   Cancer of bladder neck (HCC) #Transitional cell bladder cancer neo-adjuvant chemo-s/p 4 cycles of dose dense MVAC; status post cystectomy. DEC 22nd, 2021- No acute findings identified within the abdomen or pelvis;  S/p cystoprostatectomy with diverting ileal conduit. No signs of recurrent or metastatic disease within the abdomen or pelvis. STABLE.  We will plan to get a CT scan abdomen pelvis no contrast/next visit in 6 months.  Ordered.  # Allergy to dye-prescription of prednisone/Singulair/ Pepcid/Benadryl.  STABLE.   # Chronic kidney disease-stage II-stable  #  Discussed importance of healthy weight/and weight loss.  Strongly recommend eating more green leafy vegetables and cutting down processed food/ carbohydrates [pasta/eating out].  Instead increasing whole grains / protein in the diet.  Multiple studies have shown that optimal  weight would help improve cardiovascular risk.   # DISPOSITION: # follow up in 6 months/labs- cbc/cmp; CT A/P prior--Dr.B  All questions were answered. The patient knows to call the clinic with any problems, questions or concerns.     Cammie Sickle, MD 03/04/2021 5:56 PM

## 2021-03-09 DIAGNOSIS — I10 Essential (primary) hypertension: Secondary | ICD-10-CM | POA: Diagnosis not present

## 2021-03-09 DIAGNOSIS — Z125 Encounter for screening for malignant neoplasm of prostate: Secondary | ICD-10-CM | POA: Diagnosis not present

## 2021-03-09 DIAGNOSIS — R7303 Prediabetes: Secondary | ICD-10-CM | POA: Diagnosis not present

## 2021-03-09 DIAGNOSIS — E7800 Pure hypercholesterolemia, unspecified: Secondary | ICD-10-CM | POA: Diagnosis not present

## 2021-03-10 DIAGNOSIS — N130 Hydronephrosis with ureteropelvic junction obstruction: Secondary | ICD-10-CM | POA: Diagnosis not present

## 2021-03-10 DIAGNOSIS — C775 Secondary and unspecified malignant neoplasm of intrapelvic lymph nodes: Secondary | ICD-10-CM | POA: Diagnosis not present

## 2021-03-10 DIAGNOSIS — A4901 Methicillin susceptible Staphylococcus aureus infection, unspecified site: Secondary | ICD-10-CM | POA: Diagnosis not present

## 2021-03-10 DIAGNOSIS — R7881 Bacteremia: Secondary | ICD-10-CM | POA: Diagnosis not present

## 2021-03-10 DIAGNOSIS — C678 Malignant neoplasm of overlapping sites of bladder: Secondary | ICD-10-CM | POA: Diagnosis not present

## 2021-03-10 DIAGNOSIS — Z936 Other artificial openings of urinary tract status: Secondary | ICD-10-CM | POA: Diagnosis not present

## 2021-03-16 DIAGNOSIS — I10 Essential (primary) hypertension: Secondary | ICD-10-CM | POA: Diagnosis not present

## 2021-03-16 DIAGNOSIS — J449 Chronic obstructive pulmonary disease, unspecified: Secondary | ICD-10-CM | POA: Diagnosis not present

## 2021-03-16 DIAGNOSIS — Z0000 Encounter for general adult medical examination without abnormal findings: Secondary | ICD-10-CM | POA: Diagnosis not present

## 2021-03-16 DIAGNOSIS — R7303 Prediabetes: Secondary | ICD-10-CM | POA: Diagnosis not present

## 2021-03-16 DIAGNOSIS — E7800 Pure hypercholesterolemia, unspecified: Secondary | ICD-10-CM | POA: Diagnosis not present

## 2021-03-16 DIAGNOSIS — Z933 Colostomy status: Secondary | ICD-10-CM | POA: Diagnosis not present

## 2021-03-31 DIAGNOSIS — Z936 Other artificial openings of urinary tract status: Secondary | ICD-10-CM | POA: Diagnosis not present

## 2021-03-31 DIAGNOSIS — C775 Secondary and unspecified malignant neoplasm of intrapelvic lymph nodes: Secondary | ICD-10-CM | POA: Diagnosis not present

## 2021-03-31 DIAGNOSIS — A4901 Methicillin susceptible Staphylococcus aureus infection, unspecified site: Secondary | ICD-10-CM | POA: Diagnosis not present

## 2021-03-31 DIAGNOSIS — N130 Hydronephrosis with ureteropelvic junction obstruction: Secondary | ICD-10-CM | POA: Diagnosis not present

## 2021-03-31 DIAGNOSIS — C678 Malignant neoplasm of overlapping sites of bladder: Secondary | ICD-10-CM | POA: Diagnosis not present

## 2021-03-31 DIAGNOSIS — R7881 Bacteremia: Secondary | ICD-10-CM | POA: Diagnosis not present

## 2021-04-23 ENCOUNTER — Emergency Department: Payer: PPO

## 2021-04-23 ENCOUNTER — Other Ambulatory Visit: Payer: Self-pay

## 2021-04-23 ENCOUNTER — Emergency Department
Admission: EM | Admit: 2021-04-23 | Discharge: 2021-04-24 | Disposition: A | Discharge status: Other Acute Inpatient Hospital | Payer: PPO | Attending: Emergency Medicine | Admitting: Emergency Medicine

## 2021-04-23 DIAGNOSIS — Z79899 Other long term (current) drug therapy: Secondary | ICD-10-CM | POA: Insufficient documentation

## 2021-04-23 DIAGNOSIS — Z7982 Long term (current) use of aspirin: Secondary | ICD-10-CM | POA: Insufficient documentation

## 2021-04-23 DIAGNOSIS — N1330 Unspecified hydronephrosis: Secondary | ICD-10-CM | POA: Diagnosis not present

## 2021-04-23 DIAGNOSIS — J449 Chronic obstructive pulmonary disease, unspecified: Secondary | ICD-10-CM | POA: Insufficient documentation

## 2021-04-23 DIAGNOSIS — Z7952 Long term (current) use of systemic steroids: Secondary | ICD-10-CM | POA: Diagnosis not present

## 2021-04-23 DIAGNOSIS — I10 Essential (primary) hypertension: Secondary | ICD-10-CM | POA: Insufficient documentation

## 2021-04-23 DIAGNOSIS — K439 Ventral hernia without obstruction or gangrene: Secondary | ICD-10-CM | POA: Diagnosis not present

## 2021-04-23 DIAGNOSIS — Z87891 Personal history of nicotine dependence: Secondary | ICD-10-CM | POA: Diagnosis not present

## 2021-04-23 DIAGNOSIS — R1032 Left lower quadrant pain: Secondary | ICD-10-CM | POA: Diagnosis not present

## 2021-04-23 DIAGNOSIS — R109 Unspecified abdominal pain: Secondary | ICD-10-CM | POA: Diagnosis present

## 2021-04-23 DIAGNOSIS — K56609 Unspecified intestinal obstruction, unspecified as to partial versus complete obstruction: Secondary | ICD-10-CM

## 2021-04-23 DIAGNOSIS — Z20822 Contact with and (suspected) exposure to covid-19: Secondary | ICD-10-CM | POA: Diagnosis not present

## 2021-04-23 DIAGNOSIS — R509 Fever, unspecified: Secondary | ICD-10-CM | POA: Diagnosis not present

## 2021-04-23 DIAGNOSIS — K433 Parastomal hernia with obstruction, without gangrene: Secondary | ICD-10-CM | POA: Diagnosis not present

## 2021-04-23 DIAGNOSIS — K219 Gastro-esophageal reflux disease without esophagitis: Secondary | ICD-10-CM | POA: Insufficient documentation

## 2021-04-23 DIAGNOSIS — R000 Tachycardia, unspecified: Secondary | ICD-10-CM | POA: Diagnosis not present

## 2021-04-23 DIAGNOSIS — R112 Nausea with vomiting, unspecified: Secondary | ICD-10-CM | POA: Diagnosis not present

## 2021-04-23 DIAGNOSIS — Z4682 Encounter for fitting and adjustment of non-vascular catheter: Secondary | ICD-10-CM | POA: Diagnosis not present

## 2021-04-23 DIAGNOSIS — K5730 Diverticulosis of large intestine without perforation or abscess without bleeding: Secondary | ICD-10-CM | POA: Diagnosis not present

## 2021-04-23 LAB — RESP PANEL BY RT-PCR (FLU A&B, COVID) ARPGX2
Influenza A by PCR: NEGATIVE
Influenza B by PCR: NEGATIVE
SARS Coronavirus 2 by RT PCR: NEGATIVE

## 2021-04-23 MED ORDER — LACTATED RINGERS IV BOLUS
1000.0000 mL | Freq: Once | INTRAVENOUS | Status: AC
  Administered 2021-04-23: 1000 mL via INTRAVENOUS

## 2021-04-23 MED ORDER — HYDROMORPHONE HCL 1 MG/ML IJ SOLN
1.0000 mg | Freq: Once | INTRAMUSCULAR | Status: AC | PRN
Start: 2021-04-23 — End: 2021-04-23
  Administered 2021-04-23: 1 mg via INTRAVENOUS
  Filled 2021-04-23: qty 1

## 2021-04-23 MED ORDER — ONDANSETRON HCL 4 MG/2ML IJ SOLN
4.0000 mg | Freq: Once | INTRAMUSCULAR | Status: AC
  Administered 2021-04-23: 4 mg via INTRAVENOUS
  Filled 2021-04-23: qty 2

## 2021-04-23 NOTE — ED Notes (Signed)
Called [facility] for update on bed assignment. Still waiting.

## 2021-04-23 NOTE — ED Notes (Signed)
OK per [place]ord to wait for room for admission orders

## 2021-04-23 NOTE — ED Notes (Signed)
PT NG to Low Suction, draining green fluid. Pt resting in bed no needs at this time

## 2021-04-23 NOTE — ED Provider Notes (Signed)
[facility] Emergency Department Provider Note  ____________________________________________  Time seen: Approximately 7:03 PM  I have reviewed the triage vital signs and the nursing notes.   HISTORY  Chief Complaint Abdominal pain   HPI TRIGGER FRASIER is a 70 y.o. male with a history of COPD hypertension and bladder cancer status post resection and chemotherapy who complains of worsening abdominal pain and distention for the past 4 days.  Constant, waxing waning, severe, nonradiating, no aggravating or alleviating factors.  He has not been able to eat for the last 3 days and is nauseated.  Also reports no bowel movement for the last 3 days and not passing gas.    Past Medical History:  Diagnosis Date  . Acid reflux   . Allergy   . Cancer of bladder neck ([place]) 02/2018   Chemo tx's and surgical resection.  Marland Kitchen COPD (chronic obstructive pulmonary disease) ([place])   . High cholesterol   . Hyperlipidemia 04/03/2018  . Hypertension   . Pneumonia      Patient Active Problem List   Diagnosis Date Noted  . Altered mental status   . Severe sepsis ([place]) 09/02/2018  . Bladder cancer ([place]) 08/10/2018  . Cancer of bladder neck ([place]) 04/16/2018  . Hydronephrosis of right kidney 04/04/2018  . Right inguinal hernia 04/03/2018  . Hyperlipidemia 04/03/2018  . Constipation 09/30/2015  . Vasomotor rhinitis 05/30/2014  . COPD, mild ([place]) 05/17/2014  . Essential hypertension 05/17/2014  . GERD without esophagitis 05/17/2014     Past Surgical History:  Procedure Laterality Date  . CYSTOSCOPY W/ URETERAL STENT REMOVAL Right 08/10/2018   Procedure: CYSTOSCOPY WITH STENT REMOVAL;  Surgeon: Alexis Frock, MD;  Location: [facility];  Service: Urology;  Laterality: Right;  . CYSTOSCOPY WITH BIOPSY N/A 04/04/2018   Procedure: CYSTOSCOPY WITH TURBT;  Surgeon: Hollice Espy, MD;  Location: [facility];  Service: Urology;  Laterality: N/A;  . CYSTOSCOPY WITH URETEROSCOPY AND  STENT PLACEMENT Right 04/04/2018   Procedure: CYSTOSCOPY WITH URETEROSCOPY AND STENT PLACEMENT;  Surgeon: Hollice Espy, MD;  Location: [facility];  Service: Urology;  Laterality: Right;  . NASAL SINUS SURGERY    . PORTA CATH INSERTION N/A 04/23/2018   Procedure: PORTA CATH INSERTION;  Surgeon: Algernon Huxley, MD;  Location: [facility];  Service: Cardiovascular;  Laterality: N/A;  . PORTA CATH REMOVAL N/A 09/04/2018   Procedure: PORTA CATH REMOVAL;  Surgeon: Katha Cabal, MD;  Location: [facility];  Service: Cardiovascular;  Laterality: N/A;  . TEE WITHOUT CARDIOVERSION N/A 09/05/2018   Procedure: TRANSESOPHAGEAL ECHOCARDIOGRAM (TEE);  Surgeon: Minna Merritts, MD;  Location: [facility];  Service: Cardiovascular;  Laterality: N/A;  . TRANSURETHRAL RESECTION OF PROSTATE N/A 04/04/2018   Procedure: TRANSURETHRAL RESECTION OF THE PROSTATE (TURP);  Surgeon: Hollice Espy, MD;  Location: [facility];  Service: Urology;  Laterality: N/A;  . URETERAL BIOPSY Right 04/04/2018   Procedure: URETERAL BIOPSY;  Surgeon: Hollice Espy, MD;  Location: [facility];  Service: Urology;  Laterality: Right;     Prior to Admission medications   Medication Sig Start Date End Date Taking? Authorizing Provider  amLODipine (NORVASC) 10 MG tablet Take 10 mg by mouth daily.    [provider]  aspirin EC 81 MG tablet Take 81 mg by mouth daily.    [provider]  atorvastatin (LIPITOR) 20 MG tablet Take 20 mg by mouth daily.    [provider]  azelastine (ASTELIN) 0.1 % nasal spray Place 1  spray into both nostrils 2 (two) times daily as needed (congestion). Use in each nostril as directed    [provider]  fluticasone-salmeterol (WIXELA INHUB) 250-50 MCG/ACT AEPB Inhale 1 puff into the lungs in the morning and at bedtime.    [provider]  hydrochlorothiazide (HYDRODIURIL) 12.5 MG tablet Take 12.5 mg by mouth daily.  03/21/18   [provider]   metoprolol tartrate (LOPRESSOR) 25 MG tablet TAKE 1 TABLET(25 MG) BY MOUTH TWICE DAILY 02/22/21   Cammie Sickle, MD  Multiple Vitamins-Minerals (MULTIVITAMIN WITH MINERALS) tablet Take 1 tablet by mouth daily.    [provider]  pantoprazole (PROTONIX) 40 MG tablet Take 40 mg by mouth every evening.     [provider]  tiotropium (SPIRIVA HANDIHALER) 18 MCG inhalation capsule Spiriva with HandiHaler 18 mcg and inhalation capsules  PLACE 1 CAPSULE INTO INHALER AND INHALE ONCE DAILY    [provider]  tiotropium (SPIRIVA) 18 MCG inhalation capsule Place 18 mcg into inhaler and inhale daily as needed (congestion).     [provider]     Allergies Contrast media [iodinated diagnostic agents]   Family History  Problem Relation Age of Onset  . Diabetes Mother   . Heart disease Father   . Diabetes Brother   . Heart attack Brother   . Heart disease Brother     Social History Social History   Tobacco Use  . Smoking status: Former    Packs/day: 1.50    Years: 30.00    Pack years: 45.00    Types: Cigarettes    Quit date: 2009    Years since quitting: 13.7  . Smokeless tobacco: Former  Media planner  . Vaping Use: Never used  Substance Use Topics  . Alcohol use: Yes    Alcohol/week: 7.0 standard drinks    Types: 5 Cans of beer, 2 Standard drinks or equivalent per week    Comment: ocassional   . Drug use: Not Currently    Review of Systems  Constitutional:   No fever or chills.  ENT:   No sore throat. No rhinorrhea. Cardiovascular:   No chest pain or syncope. Respiratory:   No dyspnea or cough. Gastrointestinal:   Positive as above for abdominal pain and constipation Musculoskeletal:   Negative for focal pain or swelling All other systems reviewed and are negative except as documented above in ROS and HPI.  ____________________________________________   PHYSICAL EXAM:  VITAL SIGNS: ED Triage Vitals  Enc Vitals Group     BP  04/23/21 1655 137/89     Pulse Rate 04/23/21 1655 (!) 123     Resp 04/23/21 1655 18     Temp 04/23/21 1655 99 F (37.2 C)     Temp Source 04/23/21 1655 Oral     SpO2 04/23/21 1655 95 %     Weight 04/23/21 1656 207 lb (93.9 kg)     Height 04/23/21 1656 5\' 7"  (1.702 m)     Head Circumference --      Peak Flow --      Pain Score 04/23/21 1655 1     Pain Loc --      Pain Edu? --      Excl. in [place]? --     Vital signs reviewed, nursing assessments reviewed.   Constitutional:   Alert and oriented. Non-toxic appearance. Eyes:   Conjunctivae are normal. EOMI. PERRL. ENT      Head:   Normocephalic and atraumatic.  Nose:   Normal.      Mouth/Throat:   Dry mucous membranes.      Neck:   No meningismus. Full ROM. Hematological/Lymphatic/Immunilogical:   No cervical lymphadenopathy. Cardiovascular:   Tachycardia heart rate 110. Symmetric bilateral radial and DP pulses.  No murmurs. Cap refill less than 2 seconds. Respiratory:   Normal respiratory effort without tachypnea/retractions. Breath sounds are clear and equal bilaterally. No wheezes/rales/rhonchi. Gastrointestinal:   Soft with diffuse tenderness.  Markedly distended.. T  No rebound, rigidity, or guarding.  Musculoskeletal:   Normal range of motion in all extremities. No joint effusions.  No lower extremity tenderness.  No edema. Neurologic:   Normal speech and language.  Motor grossly intact. No acute focal neurologic deficits are appreciated.  Skin:    Skin is warm, dry and intact. No rash noted.  No petechiae, purpura, or bullae.  ____________________________________________    LABS (pertinent positives/negatives) (all labs ordered are listed, but only abnormal results are displayed) Labs Reviewed  RESP PANEL BY RT-PCR (FLU A&B, COVID) [id number]   ____________________________________________   EKG    ____________________________________________    RADIOLOGY  CT ABDOMEN PELVIS WO CONTRAST  Result Date:  04/23/2021 CLINICAL DATA:  Abdominal distension, abdominal pain EXAM: CT ABDOMEN AND PELVIS WITHOUT CONTRAST TECHNIQUE: Multidetector CT imaging of the abdomen and pelvis was performed following the standard protocol without IV contrast. Unenhanced CT was performed per clinician order. Lack of IV contrast limits sensitivity and specificity, especially for evaluation of abdominal/pelvic solid viscera. COMPARISON:  07/08/2020, 04/23/2021 FINDINGS: Lower chest: No acute pleural or parenchymal lung disease. Hepatobiliary: Unenhanced imaging of the liver and gallbladder demonstrates no acute findings. Subcentimeter hepatic cysts are again noted unchanged. Pancreas: Unremarkable. No pancreatic ductal dilatation or surrounding inflammatory changes. Spleen: Normal in size without focal abnormality. Adrenals/Urinary Tract: Indeterminate 1.8 cm hypodensity right kidney with 5 mm associated calcification unchanged since prior study. No other focal parenchymal renal abnormalities on this unenhanced exam. There is mild bilateral hydronephrosis and hydroureter, with no evidence of urinary tract calculi. Postsurgical changes from cystectomy with urinary diversion right lower quadrant. The adrenals are unremarkable. Stomach/Bowel: There is a high-grade small bowel obstruction, with transition in the right lower quadrant at the site of a parastomal hernia, reference image 40/2. The small bowel distension and parastomal hernia result in mass effect on the urinary diversion, which may account for the mild obstructive uropathy described above. There is no bowel wall thickening. Colon is decompressed, with scattered diverticulosis throughout the descending and sigmoid colon. No evidence of diverticulitis. Vascular/Lymphatic: Aortic atherosclerosis. No enlarged abdominal or pelvic lymph nodes. Reproductive: Patient is status post prostatectomy. Other: No free fluid or free gas. Since the previous exam, the right parastomal hernia has  developed. There is a persistent midline fat containing supraumbilical ventral hernia, as well as wide diastasis of the rectus musculature at the level the umbilicus unchanged. Musculoskeletal: No acute or destructive bony lesions. Reconstructed images demonstrate no additional findings. IMPRESSION: 1. High-grade small bowel obstruction, with transition point at the site of a right mid abdominal parastomal hernia containing a knuckle of mid to distal jejunum. No evidence of bowel ischemia. 2. Mild bilateral hydronephrosis and hydroureter, which may result from mass effect on the urinary diversion in the right mid abdomen related to the parastomal hernia and small-bowel obstruction described above. 3. Stable right renal cyst or calyceal diverticulum with associated calcification. 4. Postsurgical changes from cysto prostatectomy. 5. Stable midline fat containing ventral hernia and diastasis of the rectus musculature.  6. Distal colonic diverticulosis without diverticulitis. 7.  Choose 1 Electronically Signed   By: Randa Ngo M.D.   On: 04/23/2021 18:09    ____________________________________________   PROCEDURES Procedures  ____________________________________________  DIFFERENTIAL DIAGNOSIS   Bowel obstruction, bowel perforation  CLINICAL IMPRESSION / ASSESSMENT AND PLAN / ED COURSE  Medications ordered in the ED: Medications  lactated ringers bolus 1,000 mL (1,000 mLs Intravenous New Bag/Given 04/23/21 1804)  ondansetron (ZOFRAN) injection 4 mg (4 mg Intravenous Given 04/23/21 1803)  HYDROmorphone (DILAUDID) injection 1 mg (1 mg Intravenous Given 04/23/21 1804)    Pertinent labs & imaging results that were available during my care of the patient were reviewed by me and considered in my medical decision making (see chart for details).  Steve Gilbert was evaluated in Emergency Department on 04/23/2021 for the symptoms described in the history of present illness. He was evaluated in the  context of the global COVID-19 pandemic, which necessitated consideration that the patient might be at risk for infection with the SARS-CoV-2 virus that causes COVID-19. Institutional protocols and algorithms that pertain to the evaluation of patients at risk for COVID-19 are in a state of rapid change based on information released by regulatory bodies including the CDC and federal and state organizations. These policies and algorithms were followed during the patient's care in the ED.   Patient presents with abdominal pain and distention.  He is tachycardic and appears dehydrated.  Not septic.  CT scan had to be performed without IV contrast due to patient's previous contrast allergy and time sensitive nature of current presentation.  Imaging shows high-grade bowel obstruction due to parastomal hernia.  No evidence of ischemia.  We will plan to place NG tube and admit for further management.  Clinical Course as of 04/23/21 2213  Fri Apr 23, 2021  2103 CT scan shows a parastomal hernia of small bowel causing high-grade SBO as well as obstruction of the ileal conduit outflow and some development of hydronephrosis.  Discussed with general surgery who was not able to manage this here.  Discussed with urology who also is not able to manage this here should the patient require surgical intervention.  We will contact [facility].  Urostomy was placed in January 2020 by [facility] health urology Dr. Tresa Moore [PS]  2137 Case discussed with [facility] Dr. Windle Guard who agrees that patient can be cared for at their facility.  She and Dr. Tresa Moore (who is also aware of pt's current issue) agree that care can be coordinated between general surgery and urology and recommend hospitalist admission at [facility]. [PS]  2213 Parastomal hernia is irreducible at the bedside for me.  CT does not show any signs of bowel ischemia.  [place] surgery aware. [PS]    Clinical Course User Index [PS] Carrie Mew, MD      ____________________________________________   FINAL CLINICAL IMPRESSION(S) / ED DIAGNOSES    Final diagnoses:  Small bowel obstruction [facility])     ED Discharge Orders     None       Portions of this note were generated with dragon dictation software. Dictation errors may occur despite best attempts at proofreading.    Carrie Mew, MD 04/23/21 1911

## 2021-04-23 NOTE — ED Triage Notes (Signed)
Pt comes pov from [facility] with bowel obstruction. Pt had labs and scans done at [facility] and sent here.

## 2021-04-24 ENCOUNTER — Encounter (HOSPITAL_COMMUNITY): Payer: Self-pay | Admitting: Internal Medicine

## 2021-04-24 ENCOUNTER — Encounter (HOSPITAL_COMMUNITY)
Admission: AD | Disposition: A | Discharge status: Home / Self Care | Payer: Self-pay | Source: Other Acute Inpatient Hospital | Attending: Internal Medicine

## 2021-04-24 ENCOUNTER — Inpatient Hospital Stay (HOSPITAL_COMMUNITY): Payer: PPO | Admitting: Certified Registered Nurse Anesthetist

## 2021-04-24 ENCOUNTER — Inpatient Hospital Stay (HOSPITAL_COMMUNITY)
Admission: AD | Admit: 2021-04-24 | Discharge: 2021-04-29 | DRG: 336 | Disposition: A | Discharge status: Home / Self Care | Payer: PPO | Source: Other Acute Inpatient Hospital | Attending: Internal Medicine | Admitting: Internal Medicine

## 2021-04-24 DIAGNOSIS — K433 Parastomal hernia with obstruction, without gangrene: Secondary | ICD-10-CM | POA: Diagnosis not present

## 2021-04-24 DIAGNOSIS — R7303 Prediabetes: Secondary | ICD-10-CM | POA: Diagnosis not present

## 2021-04-24 DIAGNOSIS — K5669 Other partial intestinal obstruction: Secondary | ICD-10-CM | POA: Diagnosis not present

## 2021-04-24 DIAGNOSIS — Z6832 Body mass index (BMI) 32.0-32.9, adult: Secondary | ICD-10-CM | POA: Diagnosis not present

## 2021-04-24 DIAGNOSIS — K219 Gastro-esophageal reflux disease without esophagitis: Secondary | ICD-10-CM | POA: Diagnosis not present

## 2021-04-24 DIAGNOSIS — I10 Essential (primary) hypertension: Secondary | ICD-10-CM | POA: Diagnosis present

## 2021-04-24 DIAGNOSIS — N1339 Other hydronephrosis: Secondary | ICD-10-CM | POA: Diagnosis not present

## 2021-04-24 DIAGNOSIS — C679 Malignant neoplasm of bladder, unspecified: Secondary | ICD-10-CM | POA: Diagnosis not present

## 2021-04-24 DIAGNOSIS — Z91041 Radiographic dye allergy status: Secondary | ICD-10-CM

## 2021-04-24 DIAGNOSIS — N183 Chronic kidney disease, stage 3 unspecified: Secondary | ICD-10-CM | POA: Diagnosis not present

## 2021-04-24 DIAGNOSIS — K56609 Unspecified intestinal obstruction, unspecified as to partial versus complete obstruction: Secondary | ICD-10-CM | POA: Diagnosis not present

## 2021-04-24 DIAGNOSIS — K660 Peritoneal adhesions (postprocedural) (postinfection): Secondary | ICD-10-CM | POA: Diagnosis present

## 2021-04-24 DIAGNOSIS — E785 Hyperlipidemia, unspecified: Secondary | ICD-10-CM | POA: Diagnosis not present

## 2021-04-24 DIAGNOSIS — R739 Hyperglycemia, unspecified: Secondary | ICD-10-CM | POA: Diagnosis not present

## 2021-04-24 DIAGNOSIS — Z8249 Family history of ischemic heart disease and other diseases of the circulatory system: Secondary | ICD-10-CM | POA: Diagnosis not present

## 2021-04-24 DIAGNOSIS — Z7982 Long term (current) use of aspirin: Secondary | ICD-10-CM

## 2021-04-24 DIAGNOSIS — E7800 Pure hypercholesterolemia, unspecified: Secondary | ICD-10-CM | POA: Diagnosis not present

## 2021-04-24 DIAGNOSIS — Z87891 Personal history of nicotine dependence: Secondary | ICD-10-CM

## 2021-04-24 DIAGNOSIS — K430 Incisional hernia with obstruction, without gangrene: Secondary | ICD-10-CM | POA: Diagnosis not present

## 2021-04-24 DIAGNOSIS — K9189 Other postprocedural complications and disorders of digestive system: Secondary | ICD-10-CM | POA: Diagnosis not present

## 2021-04-24 DIAGNOSIS — K435 Parastomal hernia without obstruction or  gangrene: Secondary | ICD-10-CM | POA: Diagnosis not present

## 2021-04-24 DIAGNOSIS — Z8551 Personal history of malignant neoplasm of bladder: Secondary | ICD-10-CM

## 2021-04-24 DIAGNOSIS — Z9221 Personal history of antineoplastic chemotherapy: Secondary | ICD-10-CM

## 2021-04-24 DIAGNOSIS — K567 Ileus, unspecified: Secondary | ICD-10-CM | POA: Diagnosis not present

## 2021-04-24 DIAGNOSIS — Z906 Acquired absence of other parts of urinary tract: Secondary | ICD-10-CM

## 2021-04-24 DIAGNOSIS — Z79899 Other long term (current) drug therapy: Secondary | ICD-10-CM

## 2021-04-24 DIAGNOSIS — N1831 Chronic kidney disease, stage 3a: Secondary | ICD-10-CM | POA: Diagnosis not present

## 2021-04-24 DIAGNOSIS — K432 Incisional hernia without obstruction or gangrene: Secondary | ICD-10-CM | POA: Diagnosis present

## 2021-04-24 DIAGNOSIS — Z9079 Acquired absence of other genital organ(s): Secondary | ICD-10-CM

## 2021-04-24 DIAGNOSIS — Z833 Family history of diabetes mellitus: Secondary | ICD-10-CM | POA: Diagnosis not present

## 2021-04-24 DIAGNOSIS — N179 Acute kidney failure, unspecified: Secondary | ICD-10-CM | POA: Diagnosis present

## 2021-04-24 DIAGNOSIS — I129 Hypertensive chronic kidney disease with stage 1 through stage 4 chronic kidney disease, or unspecified chronic kidney disease: Secondary | ICD-10-CM | POA: Diagnosis not present

## 2021-04-24 DIAGNOSIS — J449 Chronic obstructive pulmonary disease, unspecified: Secondary | ICD-10-CM | POA: Diagnosis not present

## 2021-04-24 DIAGNOSIS — E669 Obesity, unspecified: Secondary | ICD-10-CM | POA: Diagnosis not present

## 2021-04-24 DIAGNOSIS — N182 Chronic kidney disease, stage 2 (mild): Secondary | ICD-10-CM | POA: Insufficient documentation

## 2021-04-24 HISTORY — PX: LAPAROTOMY: SHX154

## 2021-04-24 LAB — CBC WITH DIFFERENTIAL/PLATELET
Band Neutrophils: 5 %
Basophils Relative: 0 %
Blasts: NONE SEEN %
Eosinophils Relative: 2 %
HCT: 47 % (ref 39.0–52.0)
Hemoglobin: 15.3 g/dL (ref 13.0–17.0)
Lymphocytes Relative: 7 %
MCH: 30.3 pg (ref 26.0–34.0)
MCHC: 32.6 g/dL (ref 30.0–36.0)
MCV: 93.1 fL (ref 80.0–100.0)
Metamyelocytes Relative: NONE SEEN %
Monocytes Relative: 15 %
Myelocytes: NONE SEEN %
Neutrophils Relative %: 71 %
Platelets: 378 10*3/uL (ref 150–400)
Promyelocytes Relative: NONE SEEN %
RBC Morphology: NORMAL
RBC: 5.05 MIL/uL (ref 4.22–5.81)
RDW: 13.2 % (ref 11.5–15.5)
WBC Morphology: NORMAL
WBC: 5 10*3/uL (ref 4.0–10.5)
nRBC: 0 % (ref 0.0–0.2)
nRBC: NONE SEEN /100 WBC

## 2021-04-24 LAB — SURGICAL PCR SCREEN
MRSA, PCR: NEGATIVE
Staphylococcus aureus: NEGATIVE

## 2021-04-24 LAB — BASIC METABOLIC PANEL
Anion gap: 12 (ref 5–15)
BUN: 36 mg/dL — ABNORMAL HIGH (ref 8–23)
CO2: 28 mmol/L (ref 22–32)
Calcium: 9.5 mg/dL (ref 8.9–10.3)
Chloride: 102 mmol/L (ref 98–111)
Creatinine, Ser: 1.55 mg/dL — ABNORMAL HIGH (ref 0.61–1.24)
GFR, Estimated: 48 mL/min — ABNORMAL LOW (ref >60)
Glucose, Bld: 113 mg/dL — ABNORMAL HIGH (ref 70–99)
Potassium: 4.1 mmol/L (ref 3.5–5.1)
Sodium: 142 mmol/L (ref 135–145)

## 2021-04-24 LAB — HIV ANTIBODY (ROUTINE TESTING W REFLEX): HIV Screen 4th Generation wRfx: NONREACTIVE

## 2021-04-24 SURGERY — LAPAROTOMY, EXPLORATORY
Anesthesia: General

## 2021-04-24 MED ORDER — ONDANSETRON HCL 4 MG/2ML IJ SOLN
INTRAMUSCULAR | Status: AC
  Filled 2021-04-24: qty 2

## 2021-04-24 MED ORDER — SODIUM CHLORIDE 0.9% FLUSH: 3.0000 mL | INTRAVENOUS | Status: DC | PRN

## 2021-04-24 MED ORDER — ONDANSETRON HCL 4 MG/2ML IJ SOLN: 4.0000 mg | Freq: Four times a day (QID) | INTRAMUSCULAR | Status: DC | PRN

## 2021-04-24 MED ORDER — LACTATED RINGERS IV SOLN: INTRAVENOUS | Status: DC | PRN

## 2021-04-24 MED ORDER — HYDROMORPHONE HCL 1 MG/ML IJ SOLN
INTRAMUSCULAR | Status: AC
  Filled 2021-04-24: qty 2

## 2021-04-24 MED ORDER — PANTOPRAZOLE SODIUM 40 MG IV SOLR
40.0000 mg | INTRAVENOUS | Status: DC
  Administered 2021-04-25 – 2021-04-28 (×4): 40 mg via INTRAVENOUS
  Filled 2021-04-24 (×4): qty 40

## 2021-04-24 MED ORDER — METOCLOPRAMIDE HCL 5 MG/ML IJ SOLN
10.0000 mg | Freq: Four times a day (QID) | INTRAMUSCULAR | Status: DC
  Administered 2021-04-25 – 2021-04-29 (×19): 10 mg via INTRAVENOUS
  Filled 2021-04-24 (×19): qty 2

## 2021-04-24 MED ORDER — FENTANYL CITRATE (PF) 100 MCG/2ML IJ SOLN
INTRAMUSCULAR | Status: DC | PRN
  Administered 2021-04-24 (×2): 50 ug via INTRAVENOUS
  Administered 2021-04-24: 100 ug via INTRAVENOUS
  Administered 2021-04-24: 50 ug via INTRAVENOUS

## 2021-04-24 MED ORDER — ENOXAPARIN SODIUM 40 MG/0.4ML IJ SOSY
40.0000 mg | PREFILLED_SYRINGE | INTRAMUSCULAR | Status: DC
  Administered 2021-04-25 – 2021-04-28 (×4): 40 mg via SUBCUTANEOUS
  Filled 2021-04-24 (×4): qty 0.4

## 2021-04-24 MED ORDER — BUPIVACAINE LIPOSOME 1.3 % IJ SUSP
INTRAMUSCULAR | Status: AC
  Filled 2021-04-24: qty 10

## 2021-04-24 MED ORDER — ROCURONIUM BROMIDE 10 MG/ML (PF) SYRINGE
PREFILLED_SYRINGE | INTRAVENOUS | Status: DC | PRN
  Administered 2021-04-24: 60 mg via INTRAVENOUS
  Administered 2021-04-24: 20 mg via INTRAVENOUS
  Administered 2021-04-24: 10 mg via INTRAVENOUS

## 2021-04-24 MED ORDER — BUPIVACAINE-EPINEPHRINE (PF) 0.25% -1:200000 IJ SOLN
INTRAMUSCULAR | Status: AC
  Filled 2021-04-24: qty 30

## 2021-04-24 MED ORDER — MUPIROCIN 2 % EX OINT: 1.0000 "application " | TOPICAL_OINTMENT | Freq: Two times a day (BID) | CUTANEOUS | Status: DC

## 2021-04-24 MED ORDER — ACETAMINOPHEN 325 MG PO TABS: 650.0000 mg | ORAL_TABLET | Freq: Four times a day (QID) | ORAL | Status: DC

## 2021-04-24 MED ORDER — CEFAZOLIN SODIUM-DEXTROSE 2-3 GM-%(50ML) IV SOLR
INTRAVENOUS | Status: DC | PRN
  Administered 2021-04-24: 2 g via INTRAVENOUS

## 2021-04-24 MED ORDER — 0.9 % SODIUM CHLORIDE (POUR BTL) OPTIME
TOPICAL | Status: DC | PRN
  Administered 2021-04-24: 1000 mL

## 2021-04-24 MED ORDER — CEFAZOLIN SODIUM-DEXTROSE 2-4 GM/100ML-% IV SOLN
INTRAVENOUS | Status: AC
  Filled 2021-04-24: qty 100

## 2021-04-24 MED ORDER — TIOTROPIUM BROMIDE MONOHYDRATE 18 MCG IN CAPS: 18.0000 ug | ORAL_CAPSULE | Freq: Every day | RESPIRATORY_TRACT | Status: DC

## 2021-04-24 MED ORDER — OXYCODONE HCL 5 MG PO TABS: 10.0000 mg | ORAL_TABLET | ORAL | Status: DC | PRN

## 2021-04-24 MED ORDER — DEXAMETHASONE SODIUM PHOSPHATE 10 MG/ML IJ SOLN
INTRAMUSCULAR | Status: DC | PRN
  Administered 2021-04-24: 5 mg via INTRAVENOUS

## 2021-04-24 MED ORDER — BUPIVACAINE LIPOSOME 1.3 % IJ SUSP
INTRAMUSCULAR | Status: DC | PRN
  Administered 2021-04-24: 20 mL

## 2021-04-24 MED ORDER — METOPROLOL TARTRATE 5 MG/5ML IV SOLN
INTRAVENOUS | Status: DC | PRN
Start: 2021-04-24 — End: 2021-04-24
  Administered 2021-04-24: 2 mg via INTRAVENOUS

## 2021-04-24 MED ORDER — MIDAZOLAM HCL 5 MG/5ML IJ SOLN
INTRAMUSCULAR | Status: DC | PRN
  Administered 2021-04-24: 2 mg via INTRAVENOUS

## 2021-04-24 MED ORDER — ONDANSETRON HCL 4 MG/2ML IJ SOLN
INTRAMUSCULAR | Status: DC | PRN
  Administered 2021-04-24: 4 mg via INTRAVENOUS

## 2021-04-24 MED ORDER — FENTANYL CITRATE (PF) 250 MCG/5ML IJ SOLN
INTRAMUSCULAR | Status: AC
  Filled 2021-04-24: qty 5

## 2021-04-24 MED ORDER — LIDOCAINE HCL (PF) 2 % IJ SOLN
INTRAMUSCULAR | Status: AC
  Filled 2021-04-24: qty 5

## 2021-04-24 MED ORDER — LIDOCAINE 2% (20 MG/ML) 5 ML SYRINGE
INTRAMUSCULAR | Status: DC | PRN
  Administered 2021-04-24: 60 mg via INTRAVENOUS

## 2021-04-24 MED ORDER — MIDAZOLAM HCL 2 MG/2ML IJ SOLN
INTRAMUSCULAR | Status: AC
  Filled 2021-04-24: qty 2

## 2021-04-24 MED ORDER — ACETAMINOPHEN 10 MG/ML IV SOLN
INTRAVENOUS | Status: DC | PRN
Start: 2021-04-24 — End: 2021-04-24
  Administered 2021-04-24: 1000 mg via INTRAVENOUS

## 2021-04-24 MED ORDER — HYDROMORPHONE HCL 1 MG/ML IJ SOLN
1.0000 mg | Freq: Once | INTRAMUSCULAR | Status: AC
  Administered 2021-04-24: 1 mg via INTRAVENOUS
  Filled 2021-04-24: qty 1

## 2021-04-24 MED ORDER — SODIUM CHLORIDE 0.9 % IV SOLN: 250.0000 mL | INTRAVENOUS | Status: DC | PRN

## 2021-04-24 MED ORDER — SODIUM CHLORIDE 0.9% FLUSH
3.0000 mL | Freq: Two times a day (BID) | INTRAVENOUS | Status: DC
  Administered 2021-04-25 – 2021-04-28 (×8): 3 mL via INTRAVENOUS

## 2021-04-24 MED ORDER — PROPOFOL 10 MG/ML IV BOLUS
INTRAVENOUS | Status: DC | PRN
  Administered 2021-04-24: 120 mg via INTRAVENOUS

## 2021-04-24 MED ORDER — METHOCARBAMOL 500 MG IVPB - SIMPLE MED
INTRAVENOUS | Status: AC
  Filled 2021-04-24: qty 50

## 2021-04-24 MED ORDER — PROCHLORPERAZINE EDISYLATE 10 MG/2ML IJ SOLN: 10.0000 mg | INTRAMUSCULAR | Status: DC | PRN

## 2021-04-24 MED ORDER — SIMETHICONE 80 MG PO CHEW: 80.0000 mg | CHEWABLE_TABLET | Freq: Four times a day (QID) | ORAL | Status: DC | PRN

## 2021-04-24 MED ORDER — DEXTROSE-NACL 5-0.9 % IV SOLN: INTRAVENOUS | Status: DC

## 2021-04-24 MED ORDER — SUGAMMADEX SODIUM 200 MG/2ML IV SOLN
INTRAVENOUS | Status: DC | PRN
  Administered 2021-04-24: 200 mg via INTRAVENOUS

## 2021-04-24 MED ORDER — ACETAMINOPHEN 10 MG/ML IV SOLN
INTRAVENOUS | Status: AC
  Filled 2021-04-24: qty 100

## 2021-04-24 MED ORDER — HYDROMORPHONE HCL 1 MG/ML IJ SOLN
0.2500 mg | INTRAMUSCULAR | Status: DC | PRN
  Administered 2021-04-24 (×4): 0.5 mg via INTRAVENOUS

## 2021-04-24 MED ORDER — ALBUMIN HUMAN 5 % IV SOLN: INTRAVENOUS | Status: DC | PRN

## 2021-04-24 MED ORDER — MORPHINE SULFATE (PF) 2 MG/ML IV SOLN
2.0000 mg | INTRAVENOUS | Status: DC | PRN
  Administered 2021-04-25 (×3): 2 mg via INTRAVENOUS
  Filled 2021-04-24 (×3): qty 1

## 2021-04-24 MED ORDER — LIDOCAINE VISCOUS HCL 2 % MT SOLN
15.0000 mL | Freq: Once | OROMUCOSAL | Status: AC
  Administered 2021-04-24: 15 mL via OROMUCOSAL
  Filled 2021-04-24: qty 15

## 2021-04-24 MED ORDER — OXYCODONE HCL 5 MG PO TABS: 5.0000 mg | ORAL_TABLET | ORAL | Status: DC | PRN

## 2021-04-24 MED ORDER — SUCCINYLCHOLINE CHLORIDE 200 MG/10ML IV SOSY
PREFILLED_SYRINGE | INTRAVENOUS | Status: DC | PRN
  Administered 2021-04-24: 120 mg via INTRAVENOUS

## 2021-04-24 MED ORDER — PHENYLEPHRINE 40 MCG/ML (10ML) SYRINGE FOR IV PUSH (FOR BLOOD PRESSURE SUPPORT)
PREFILLED_SYRINGE | INTRAVENOUS | Status: AC
  Filled 2021-04-24: qty 30

## 2021-04-24 MED ORDER — MOMETASONE FURO-FORMOTEROL FUM 200-5 MCG/ACT IN AERO: 2.0000 | INHALATION_SPRAY | Freq: Two times a day (BID) | RESPIRATORY_TRACT | Status: DC

## 2021-04-24 MED ORDER — GABAPENTIN 300 MG PO CAPS: 300.0000 mg | ORAL_CAPSULE | Freq: Three times a day (TID) | ORAL | Status: DC

## 2021-04-24 MED ORDER — BUPIVACAINE-EPINEPHRINE 0.25% -1:200000 IJ SOLN
INTRAMUSCULAR | Status: DC | PRN
  Administered 2021-04-24: 30 mL

## 2021-04-24 MED ORDER — ALBUMIN HUMAN 5 % IV SOLN
INTRAVENOUS | Status: AC
  Filled 2021-04-24: qty 250

## 2021-04-24 MED ORDER — HYDROMORPHONE HCL 1 MG/ML IJ SOLN
1.0000 mg | INTRAMUSCULAR | Status: DC | PRN
  Administered 2021-04-25 (×3): 1 mg via INTRAVENOUS
  Filled 2021-04-24 (×3): qty 1

## 2021-04-24 MED ORDER — PROPOFOL 10 MG/ML IV BOLUS
INTRAVENOUS | Status: AC
  Filled 2021-04-24: qty 20

## 2021-04-24 MED ORDER — ONDANSETRON HCL 4 MG PO TABS: 4.0000 mg | ORAL_TABLET | Freq: Four times a day (QID) | ORAL | Status: DC | PRN

## 2021-04-24 MED ORDER — METHOCARBAMOL 500 MG IVPB - SIMPLE MED
500.0000 mg | Freq: Four times a day (QID) | INTRAVENOUS | Status: DC | PRN
  Administered 2021-04-24 – 2021-04-25 (×3): 500 mg via INTRAVENOUS
  Filled 2021-04-24: qty 50
  Filled 2021-04-24 (×2): qty 500

## 2021-04-24 MED ORDER — LACTATED RINGERS IV SOLN: INTRAVENOUS | Status: DC

## 2021-04-24 SURGICAL SUPPLY — 36 items
BAG COUNTER SPONGE SURGICOUNT (BAG) ×2 IMPLANT
BINDER ABDOMINAL 12 ML 46-62 (SOFTGOODS) IMPLANT
CHLORAPREP W/TINT 26 (MISCELLANEOUS) ×2 IMPLANT
COVER SURGICAL LIGHT HANDLE (MISCELLANEOUS) ×2 IMPLANT
DERMABOND ADVANCED (GAUZE/BANDAGES/DRESSINGS) ×1
DERMABOND ADVANCED .7 DNX12 (GAUZE/BANDAGES/DRESSINGS) ×1 IMPLANT
DRAIN CHANNEL 19F RND (DRAIN) ×2 IMPLANT
DRAPE INCISE IOBAN 66X45 STRL (DRAPES) ×2 IMPLANT
DRAPE LAPAROSCOPIC ABDOMINAL (DRAPES) ×2 IMPLANT
ELECT BLADE TIP CTD 4 INCH (ELECTRODE) ×2 IMPLANT
ELECT PENCIL ROCKER SW 15FT (MISCELLANEOUS) ×2 IMPLANT
ELECT REM PT RETURN 15FT ADLT (MISCELLANEOUS) ×2 IMPLANT
EVACUATOR SILICONE 100CC (DRAIN) ×2 IMPLANT
GAUZE 4X4 16PLY ~~LOC~~+RFID DBL (SPONGE) ×2 IMPLANT
GAUZE SPONGE 4X4 12PLY STRL (GAUZE/BANDAGES/DRESSINGS) ×2 IMPLANT
GLOVE SRG 8 PF TXTR STRL LF DI (GLOVE) ×1 IMPLANT
GLOVE SURG ENC MOIS LTX SZ7.5 (GLOVE) ×2 IMPLANT
GLOVE SURG UNDER POLY LF SZ8 (GLOVE) ×1
GOWN STRL REUS W/TWL XL LVL3 (GOWN DISPOSABLE) ×6 IMPLANT
KIT BASIN OR (CUSTOM PROCEDURE TRAY) ×2 IMPLANT
KIT TURNOVER KIT A (KITS) ×2 IMPLANT
MARKER SKIN DUAL TIP RULER LAB (MISCELLANEOUS) ×2 IMPLANT
MESH SOFT 12X12IN BARD (Mesh General) ×2 IMPLANT
PACK GENERAL/GYN (CUSTOM PROCEDURE TRAY) ×2 IMPLANT
PENCIL SMOKE EVACUATOR (MISCELLANEOUS) ×2 IMPLANT
SPONGE DRAIN TRACH 4X4 STRL 2S (GAUZE/BANDAGES/DRESSINGS) IMPLANT
SPONGE T-LAP 18X18 ~~LOC~~+RFID (SPONGE) ×4 IMPLANT
SUT ETHILON 2 0 PS N (SUTURE) ×2 IMPLANT
SUT MNCRL AB 4-0 PS2 18 (SUTURE) ×4 IMPLANT
SUT PDS AB 2-0 CT2 27 (SUTURE) ×2 IMPLANT
SUT VIC AB 0 CT2 27 (SUTURE) ×4 IMPLANT
SUT VIC AB 2-0 SH 18 (SUTURE) ×2 IMPLANT
SUT VIC AB 3-0 SH 8-18 (SUTURE) IMPLANT
TOWEL OR 17X26 10 PK STRL BLUE (TOWEL DISPOSABLE) ×2 IMPLANT
TOWEL OR NON WOVEN STRL DISP B (DISPOSABLE) ×2 IMPLANT
TRAY FOLEY MTR SLVR 16FR STAT (SET/KITS/TRAYS/PACK) IMPLANT

## 2021-04-24 NOTE — Anesthesia Postprocedure Evaluation (Signed)
Anesthesia Post Note  Patient: Steve Gilbert  Procedure(s) Performed: EXPLORATORY LAPAROTOMY; PARASTOMAL HERNIA REPAIR Sugerbaker technique  incisional hernia repair, right sided TAR     Patient location during evaluation: PACU Anesthesia Type: General Level of consciousness: sedated Pain management: pain level controlled Vital Signs Assessment: post-procedure vital signs reviewed and stable Respiratory status: spontaneous breathing and respiratory function stable Cardiovascular status: stable Postop Assessment: no apparent nausea or vomiting Anesthetic complications: no   No notable events documented.  Last Vitals:  Vitals:   04/24/21 2351 04/24/21 2353  BP:  130/85  Pulse: (!) 108 (!) 107  Resp: (!) 22 (!) 24  Temp: 36.6 C   SpO2: 94% 94%    Last Pain:  Vitals:   04/24/21 2330  TempSrc:   PainSc: 5                  Stephon Weathers DANIEL

## 2021-04-24 NOTE — ED Notes (Signed)
Called report to [facility] room [place].

## 2021-04-24 NOTE — ED Notes (Signed)
Pt asked if he could have some medicine to help numb his throat. The NG tube is bothering him and is uncomfortable. MD made aware and will order something for him.

## 2021-04-24 NOTE — Discharge Instructions (Signed)
 VENTRAL HERNIA REPAIR POST OPERATIVE INSTRUCTIONS  Thinking Clearly  The anesthesia may cause you to feel different for 1 or 2 days. Do not drive, drink alcohol, or make any big decisions for at least 2 days.  Nutrition When you wake up, you will be able to drink small amounts of liquid. If you do not feel sick, you can slowly advance your diet to regular foods. Continue to drink lots of fluids, usually about 8 to 10 glasses per day. Eat a high-fiber diet so you don't strain during bowel movements. High-Fiber Foods Foods high in fiber include beans, bran cereals and whole-grain breads, peas, dried fruit (figs, apricots, and dates), raspberries, blackberries, strawberries, sweet corn, broccoli, baked potatoes with skin, plums, pears, apples, greens, and nuts. Activity Slowly increase your activity. Be sure to get up and walk every hour or so to prevent blood clots. No heavy lifting or strenuous activity for 4 weeks following surgery to prevent hernias at your incision sites or recurrence of your hernia. It is normal to feel tired. You may need more sleep than usual.  Get your rest but make sure to get up and move around frequently to prevent blood clots and pneumonia.  Work and Return to School You can go back to work when you feel well enough. Discuss the timing with your surgeon. You can usually go back to school or work 1 week or less after an laparoscopic or an open repair. If your work requires heavy lifting or strenuous activity you need to be placed on light duty for 4 weeks following surgery. You can return to gym class, sports or other physical activities 4 weeks after surgery.  Wound Care You may experience significant bruising throughout the abdominal wall that may track down into the groin including into the scrotum in males.  Rest, elevating the groin and scrotum above the level of the heart, ice and compression with tight fitting underwear or an abdominal binder can help.   Always wash your hands before and after touching near your incision site. Do not soak in a bathtub until cleared at your follow up appointment. You may take a shower 24 hours after surgery. A small amount of drainage from the incision is normal. If the drainage is thick and yellow or the site is red, you may have an infection, so call your surgeon. If you have a drain in one of your incisions, it will be taken out in office when the drainage stops. Steri-Strips will fall off in 7 to 10 days or they will be removed during your first office visit. If you have dermabond glue covering over the incision, allow the glue to flake off on its own. Protect the new skin, especially from the sun. The sun can burn and cause darker scarring. Your scar will heal in about 4 to 6 weeks and will become softer and continue to fade over the next year.  The cosmetic appearance of the incisions will improve over the course of the first year after surgery. Sensation around your incision will return in a few weeks or months.  Bowel Movements After intestinal surgery, you may have loose watery stools for several days. If watery diarrhea lasts longer than 3 days, contact your surgeon. Pain medication (narcotics) can cause constipation. Increase the fiber in your diet with high-fiber foods if you are constipated. You can take an over the counter stool softener like Colace to avoid constipation.  Additional over the counter medications can also be used   if Colace isn't sufficient (for example, Milk of Magnesia or Miralax).  Pain The amount of pain is different for each person. Some people need only 1 to 3 doses of pain control medication, while others need more. Take alternating doses of tylenol and ibuprofen around the clock for the first five days following surgery.  This will provide a baseline of pain control and help with inflammation.  Take the narcotic pain medication in addition if needed for severe pain.  Contact  Your Surgeon at [phone], if you have: Pain that will not go away Pain that gets worse A fever of more than 101F (38.3C) Repeated vomiting Swelling, redness, bleeding, or bad-smelling drainage from your wound site Strong abdominal pain No bowel movement or unable to pass gas for 3 days Watery diarrhea lasting longer than 3 days  Pain Control The goal of pain control is to minimize pain, keep you moving and help you heal. Your surgical team will work with you on your pain plan. Most often a combination of therapies and medications are used to control your pain. You may also be given medication (local anesthetic) at the surgical site. This may help control your pain for several days. Extreme pain puts extra stress on your body at a time when your body needs to focus on healing. Do not wait until your pain has reached a level "10" or is unbearable before telling your doctor or nurse. It is much easier to control pain before it becomes severe. Following a laparoscopic procedure, pain is sometimes felt in the shoulder. This is due to the gas inserted into your abdomen during the procedure. Moving and walking helps to decrease the gas and the right shoulder pain.  Use the guide below for ways to manage your post-operative pain. Learn more by going to facs.org/safepaincontrol.  How Intense Is My Pain Common Therapies to Feel Better       I hardly notice my pain, and it does not interfere with my activities.  I notice my pain and it distracts me, but I can still do activities (sitting up, walking, standing).  Non-Medication Therapies  Ice (in a bag, applied over clothing at the surgical site), elevation, rest, meditation, massage, distraction (music, TV, play) walking and mild exercise Splinting the abdomen with pillows +  Non-Opioid Medications Acetaminophen (Tylenol) Non-steroidal anti-inflammatory drugs (NSAIDS) Aspirin, Ibuprofen (Motrin, Advil) Naproxen (Aleve) Take these as  needed, when you feel pain. Both acetaminophen and NSAIDs help to decrease pain and swelling (inflammation).      My pain is hard to ignore and is more noticeable even when I rest.  My pain interferes with my usual activities.  Non-Medication Therapies  +  Non-Opioid medications  Take on a regular schedule (around-the-clock) instead of as needed. (For example, Tylenol every 6 hours at 9:00 am, 3:00 pm, 9:00 pm, 3:00 am and Motrin every 6 hours at 12:00 am, 6:00 am, 12:00 pm, 6:00 pm)         I am focused on my pain, and I am not doing my daily activities.  I am groaning in pain, and I cannot sleep. I am unable to do anything.  My pain is as bad as it could be, and nothing else matters.  Non-Medication Therapies  +  Around-the-Clock Non-Opioid Medications  +  Short-acting opioids  Opioids should be used with other medications to manage severe pain. Opioids block pain and give a feeling of euphoria (feel high). Addiction, a serious side effect of opioids, is   rare with short-term (a few days) use.  Examples of short-acting opioids include: Tramadol (Ultram), Hydrocodone (Norco, Vicodin), Hydromorphone (Dilaudid), Oxycodone (Oxycontin)     The above directions have been adapted from the American College of Surgeons Surgical Patient Education Program.  Please refer to the ACS website if needed: https://www.facs.org/-/media/files/education/patient-ed/ventral_hernia.ashx   Dwana Garin, MD [facility] [address], [address], [place], [place]  [address] ?  [address] [address], [place], [place]   [address] [phone] ? [phone] ? FAX [phone] Web site: [email or web address]  

## 2021-04-24 NOTE — ED Notes (Signed)
RN April called from [facility] for report with ETA of 5-10 mins.

## 2021-04-24 NOTE — ED Notes (Signed)
RN to bedside to introduce self to pt. Pt is CAOx4 and in no distress. Just emptied his urostomy bag and received pain meds.

## 2021-04-24 NOTE — Anesthesia Procedure Notes (Signed)
Procedure Name: Intubation Date/Time: 04/24/2021 7:49 PM Performed by: Gean Maidens, CRNA Pre-anesthesia Checklist: Patient identified, Emergency Drugs available, Suction available and Patient being monitored Patient Re-evaluated:Patient Re-evaluated prior to induction Oxygen Delivery Method: Circle system utilized Preoxygenation: Pre-oxygenation with 100% oxygen Induction Type: IV induction and Rapid sequence Laryngoscope Size: Mac Grade View: Grade I Tube type: Oral Tube size: 7.5 mm Number of attempts: 1 Airway Equipment and Method: Stylet Placement Confirmation: ETT inserted through vocal cords under direct vision, positive ETCO2 and breath sounds checked- equal and bilateral Secured at: 23 cm Tube secured with: Tape Dental Injury: Teeth and Oropharynx as per pre-operative assessment

## 2021-04-24 NOTE — Transfer of Care (Signed)
Immediate Anesthesia Transfer of Care Note  Patient: Steve Gilbert  Procedure(s) Performed: EXPLORATORY LAPAROTOMY; PARASTOMAL HERNIA REPAIR Sugerbaker technique  incisional hernia repair, right sided TAR  Patient Location: PACU  Anesthesia Type:General  Level of Consciousness: sedated, patient cooperative and responds to stimulation  Airway & Oxygen Therapy: Patient Spontanous Breathing and Patient connected to face mask oxygen  Post-op Assessment: Report given to RN and Post -op Vital signs reviewed and stable  Post vital signs: Reviewed and stable  Last Vitals:  Vitals Value Taken Time  BP 157/96 04/24/21 2304  Temp    Pulse 110 04/24/21 2306  Resp 19 04/24/21 2306  SpO2 100 % 04/24/21 2306  Vitals shown include unvalidated device data.  Last Pain:  Vitals:   04/24/21 1713  TempSrc: Oral  PainSc: 0-No pain         Complications: No notable events documented.

## 2021-04-24 NOTE — Op Note (Signed)
Patient: Steve Gilbert (11-25-1950, [id number])  Date of Surgery: 04/24/2021   Preoperative Diagnosis: Bowel obstruction secondary to an incarcerated parastomal hernia, incisional hernia  Postoperative Diagnosis: Bowel obstruction secondary to an incarcerated parastomal hernia, incisional hernia  Surgical Procedure:  Parastomal hernia repair - Sugarbaker technique Incisional hernia repair Bilateral posterior rectus sheath myofascial release Right sided transversus abdominus release Lysis of adhesions (60 minutes)  Operative Team Members:  Surgeon(s) and Role:    * Mercedes Valeriano, Nickola Major, MD - Primary   Anesthesiologist: Duane Boston, MD CRNA: Gean Maidens, CRNA   Anesthesia: General   Fluids:  Total I/O In: 9924 [I.V.:2000; IV Piggyback:350] Out: 500 [Urine:450; QASTM:19]  Complications: None  Drains:    19 Fr. Blake drain in the retromuscular position  Specimen: None   Disposition:  PACU - hemodynamically stable.  Plan of Care:  Continue inpatient care on medicine service    Indications for Procedure:   Steve Gilbert is an 70 y.o. male with a history of robotic radical cystoprostatectomy with ileal conduit and right inguinal hernia repair for bladder cancer by Dr Tresa Moore in 2020.  On CT he has developed a parastomal hernia with high grade bowel obstruction and some signs of partial obstruction of the ileal conduit at this level.  He also has a fat containing midline incisional hernia.     I recommended exploratory laparotomy with repair of incisional and parastomal hernias.  I explained my plan, primary goal being to relieve the obstruction.  Secondary goal to see what can be done to address the hernia - either tightening the abdominal wall defect primarily around the ileal conduit, a keyhole repair mesh repair or a Sugarbaker mesh repair.  I described the procedure as well as its risks, benefits and alternatives.  After a full discussion and all questions  answered the patient granted consent to proceed.  We will proceed to the OR emergently.   The case was discussed with Dr. Jeffie Pollock of urology - on call for Dr. Zettie Pho group.  It appears the urologic abnormalities on imaging are likely secondary to the parastomal hernia.  I don't anticipate requiring urologic assistance during the case but he offered help if needed.  Findings:  Hernia Location: Umbilical, infraumbilical Hernia Size:  16 cm wide x 15 cm tall  Mesh Size &Type:  26 cm wide x  22 cm tall Bard Soft mesh Mesh Position: Retromuscular (Modified Sugarbaker TAR Parastomal Repair) Myofascial Releases: Bilateral rectus myofascial release (Rives-Stoppa) Right transversus abdominis release (TAR)    Description of Procedure:   The patient was positioned supine on the operating room table, adequately padded and secured.  A timeout procedure was performed. The urostomy appliance was draped out of the field.  A wide prep and drape was performed. A large iodine impregnated drape was placed over the abdominal wall   A midline incision was made and dissection was carried down through subcutaneous tissue. The abdomen was entered safely.  There were omental and intra-loop adhesions.  A meticulous and tedious sharp lysis of adhesions was carried out.  This was completed with no injury to the viscera.  This took about 60 minutes.  The obstructed bowel was reduced from the stomal fascial defect, lysing adhesions to reduce the loop of intestine.  This was clearly the transition point with dilated bowel proximal and decompressed bowel distally.  The bowel was run from the ligament of treitz to the distal small bowel in the area of the urostomy resection anastomosis. There  were no other areas of obstruction noted.  With the obstruction released, we examined the abdominal wall, and the patient's hemodynamics and decided it was appropriate to proceed with formal repair of the parastomal hernia.  After the  anterior abdominal wall was cleared off of all adhesions, a large safety towel was placed over the viscera to protect them.  The hernia defect was located in the umbilical and infraumbilical regions. The parastomal defect was within the right rectus musculature. The parastomal defect alone measured 3cm x 3 cm.  The total hernia defect area was measured as 16 cm horizontal and 15 cm vertical.  A rectus myofascial release (Rives-Stoppa) was performed on the LEFT side. The posterior rectus sheath was incised just lateral to the hernia edge.  This incision in the posterior rectus sheath was extended both cephalad and caudal to the hernia defect.  Dissection was carried out laterally in the retromuscular plane to the edge of the rectus sheath peeling away the posterior rectus sheath from the underside of the rectus muscle. The segmental innervation to the rectus muscle was preserved.  The rectus myofascial release accomplished medialization of the posterior rectus sheath towards the midline and release of the rectus muscle from its encasement in the rectus sheath, allowing for widening of the rectus muscle and advancement towards the midline.  A rectus myofascial release (Rives-Stoppa) was performed on the RIGHT side. The posterior rectus sheath was incised just lateral to the hernia edge.  This incision in the posterior rectus sheath was extended both cephalad and caudal to the hernia defect.  Dissection was carried out laterally in the retromuscular plane to the edge of the rectus sheath peeling away the posterior rectus sheath from the underside of the rectus muscle. The segmental innervation to the rectus muscle was preserved.  The ileal conduit was carefully dissected free from the posterior rectus sheath.  There was no injury to the ileal conduit.  The rectus myofascial release accomplished medialization of the posterior rectus sheath towards the midline and release of the rectus muscle from its encasement in  the rectus sheath, allowing for widening of the rectus muscle and advancement towards the midline.  A transversus abdominis release (TAR) was performed on the right side.  The transversus abdominis muscle was identified within the posterior rectus sheath and incised vertically along its entire length, entering the pre-peritoneal or pre-transversalis fascia plane.  The peritoneum was subsequently peeled away from the underside of the divided transversus abdominis muscle.  This dissection was carried out laterally towards the retroperitoneum and was also carried out lateral to the ileal conduit.  The TAR accomplished additional medialization of the posterior rectus sheath with its attached peritoneum towards the midline to allow for visceral sac closure.  The TAR also provided further offset of tension with advancement of the rectus muscle towards the midline, as it remained attached to the external and internal abdominal oblique muscles. The ileal conduit was now repositioned laterally within the retromuscular space. The defect in the posterior layer was extended laterally, then closed medially to lateralize the conduit.  The posterior rectus sheath was reapproximated from the medial edge of the ileal conduit working towards the midline using running 0-vicryl.  The towel was removed and the posterior fascia was reapproximated in the midline with a continuous 0-vicryl suture. The ileal conduit traveled through the retromuscular plane from the lateral opening in the posterior layer to the medial opening in the rectus muscle.  A transversus abdominis plane (TAP) block was performed  bilaterally with marcaine and exparel.  The anesthetic (25 mL) was first injected into the plane between the transversus abdominis and internal abdominal oblique muscles on the left. The TAP was repeated on the contralateral side with 25 mL of the mixture.   A new set of sterile gloves were used prior to handling the mesh. A piece of  Bard Soft Mesh was brought into the operative field and sized to 26 cm wide x 22 cm tall.    The mesh was positioned in the retromuscular plane so that it lateralized the ileal conduit in the style of a modified, retromuscular Sugarbaker repair. The ileal conduit would then course from its intraperitoneal location, through the peritoneum in the lateral abdomen, up and along the superior aspect of the mesh, in between the mesh and the abdominal wall musculature, before it exited through the right rectus muscle. The ileal conduit was lying in good position.  The retromuscular space was irrigated.  One 52 [place] Blake drain was placed in the retromuscular position. The rectus muscles were re approximated in the midline utilizing a continuous 2-0 PDS suture taking 5 mm bites of the fascia with 5 mm advancement. The fascia came together well. The subcutaneous tissue was irrigated.  The subcuticular tissue was reapproximated with buried, interrupted 2-0 Vicryl suture.  The skin was closed with a 4-0 Monocryl subcuticular suture and skin glue. All sponge and needle counts were correct at the end of this case    Louanna Raw, MD General, Bariatric, & Minimally Invasive Surgery [facility], [place]

## 2021-04-24 NOTE — H&P (Addendum)
History and Physical    Steve Gilbert NKN:[id number] DOB: [birth year] DOA: 04/24/2021  PCP: Kirk Ruths, MD   Patient coming from: [facility]   Chief Complaint:  abdominal pain   HPI: Steve Gilbert is a 70 y.o. male with medical history significant of COPD, hypertension and bladder cancer (s/p resection/ileal conduit and chemotherapy). His symptoms started about 2 days prior to coming to the emergency room.  He had abdominal pain, colicky in nature, severe in intensity, associated with nausea and occasional vomiting.  No improving or worsening factors. His last meal was about 3 days and last bowel movement about 2 days prior to presenting to the emergency department.  Because of persistent symptoms he went to see his primary care provider who performed blood work and abdominal radiographs.  He was diagnosed with bowel obstruction and referred to the hospital for further evaluation. In [facility] [facility] ED a CT scan showed high-grade small bowel obstruction with transition point at the site of the right mid abdominal parastomal hernia.  Nasogastric tube was placed, with improvement of his symptoms. Patient was transferred to [facility] for further surgical and urology evaluation.  At the time of my examination patient has an NG tube in place, he does have abdominal distention, which is moderate in intensity, persistent, no associated nausea or vomiting, no improving or worsening factors. Positive flatus.   Review of Systems:  1. General: No fevers, no chills, no weight gain or weight loss 2. ENT: No runny nose or sore throat, no hearing disturbances 3. Pulmonary: No dyspnea, cough, wheezing, or hemoptysis 4. Cardiovascular: No angina, claudication, lower extremity edema, pnd or orthopnea 5. Gastrointestinal: positive nausea and vomiting as mentioned in history of present illness , no diarrhea or constipation 6. Hematology: No easy bruisability or frequent infections 7. Urology:  No dysuria, hematuria or increased urinary frequency. Urostomy in place  8. Dermatology: No rashes. 9. Neurology: No seizures or paresthesias 10. Musculoskeletal: No joint pain or deformities  Past Medical History:  Diagnosis Date   Acid reflux    Allergy    Cancer of bladder neck ([place]) 02/2018   Chemo tx's and surgical resection.   COPD (chronic obstructive pulmonary disease) (HCC)    High cholesterol    Hyperlipidemia 04/03/2018   Hypertension    Pneumonia     Past Surgical History:  Procedure Laterality Date   CYSTOSCOPY W/ URETERAL STENT REMOVAL Right 08/10/2018   Procedure: CYSTOSCOPY WITH STENT REMOVAL;  Surgeon: Alexis Frock, MD;  Location: [facility];  Service: Urology;  Laterality: Right;   CYSTOSCOPY WITH BIOPSY N/A 04/04/2018   Procedure: CYSTOSCOPY WITH TURBT;  Surgeon: Hollice Espy, MD;  Location: [facility];  Service: Urology;  Laterality: N/A;   CYSTOSCOPY WITH URETEROSCOPY AND STENT PLACEMENT Right 04/04/2018   Procedure: CYSTOSCOPY WITH URETEROSCOPY AND STENT PLACEMENT;  Surgeon: Hollice Espy, MD;  Location: [facility];  Service: Urology;  Laterality: Right;   NASAL SINUS SURGERY     PORTA CATH INSERTION N/A 04/23/2018   Procedure: PORTA CATH INSERTION;  Surgeon: Algernon Huxley, MD;  Location: [facility];  Service: Cardiovascular;  Laterality: N/A;   PORTA CATH REMOVAL N/A 09/04/2018   Procedure: PORTA CATH REMOVAL;  Surgeon: Katha Cabal, MD;  Location: [facility];  Service: Cardiovascular;  Laterality: N/A;   TEE WITHOUT CARDIOVERSION N/A 09/05/2018   Procedure: TRANSESOPHAGEAL ECHOCARDIOGRAM (TEE);  Surgeon: Minna Merritts, MD;  Location: [facility];  Service: Cardiovascular;  Laterality: N/A;  TRANSURETHRAL RESECTION OF PROSTATE N/A 04/04/2018   Procedure: TRANSURETHRAL RESECTION OF THE PROSTATE (TURP);  Surgeon: Hollice Espy, MD;  Location: [facility];  Service: Urology;  Laterality: N/A;   URETERAL BIOPSY Right 04/04/2018   Procedure:  URETERAL BIOPSY;  Surgeon: Hollice Espy, MD;  Location: [facility];  Service: Urology;  Laterality: Right;     reports that he quit smoking about 13 years ago. His smoking use included cigarettes. He has a 45.00 pack-year smoking history. He has quit using smokeless tobacco. He reports current alcohol use of about 7.0 standard drinks per week. He reports that he does not currently use drugs.  Allergies  Allergen Reactions   Contrast Media [Iodinated Diagnostic Agents] Hives, Itching and Rash    On 07/08/20 pt reports that even with premedication patient breaks out into hives 48 hours post contrast. Radiologist and ordering provider aware and for this contrast patient has been on Prednisone a few days prior and will continue prednisone for 14 days post contrast.     Family History  Problem Relation Age of Onset   Diabetes Mother    Heart disease Father    Diabetes Brother    Heart attack Brother    Heart disease Brother      Prior to Admission medications   Medication Sig Start Date End Date Taking? Authorizing Provider  amLODipine (NORVASC) 10 MG tablet Take 10 mg by mouth daily.    [provider]  aspirin EC 81 MG tablet Take 81 mg by mouth daily.    [provider]  atorvastatin (LIPITOR) 20 MG tablet Take 20 mg by mouth daily.    [provider]  azelastine (ASTELIN) 0.1 % nasal spray Place 1 spray into both nostrils 2 (two) times daily.    [provider]  fluticasone-salmeterol (ADVAIR) 250-50 MCG/ACT AEPB Inhale 1 puff into the lungs 2 (two) times daily.    [provider]  metoprolol tartrate (LOPRESSOR) 25 MG tablet TAKE 1 TABLET(25 MG) BY MOUTH TWICE DAILY 02/22/21   Cammie Sickle, MD  Multiple Vitamins-Minerals (MULTIVITAMIN WITH MINERALS) tablet Take 1 tablet by mouth daily.    [provider]  pantoprazole (PROTONIX) 40 MG tablet Take 40 mg by mouth every evening.     [provider]  tiotropium (SPIRIVA  HANDIHALER) 18 MCG inhalation capsule Place 18 mcg into inhaler and inhale daily.    [provider]    Physical Exam: There were no vitals filed for this visit.  There were no vitals filed for this visit. General: deconditioned and ill looking appearing  Neurology: Awake and alert, non focal Head and Neck. Head normocephalic. Neck supple with no adenopathy or thyromegaly.   E ENT: no pallor, no icterus, oral mucosa moist. NG tube in place.  Cardiovascular: No JVD. S1-S2 present, rhythmic, no gallops, rubs, or murmurs. No lower extremity edema. Pulmonary: vesicular breath sounds bilaterally, adequate air movement, no wheezing, rhonchi or rales. Gastrointestinal. Abdomen distended but not tender to superficial palpation, positive bowel sounds. Urostomy in place with clear urine  Skin. No rashes Musculoskeletal: no joint deformities    Labs on Admission: I have personally reviewed following labs and imaging studies  CBC: No results for input(s): WBC, NEUTROABS, HGB, HCT, MCV, PLT in the last 168 hours. Basic Metabolic Panel: No results for input(s): NA, K, CL, CO2, GLUCOSE, BUN, CREATININE, CALCIUM, MG, PHOS in the last 168 hours. GFR: CrCl cannot be calculated (Patient's most recent lab result is older than the maximum  21 days allowed.). Liver Function Tests: No results for input(s): AST, ALT, ALKPHOS, BILITOT, PROT, ALBUMIN in the last 168 hours. No results for input(s): LIPASE, AMYLASE in the last 168 hours. No results for input(s): AMMONIA in the last 168 hours. Coagulation Profile: No results for input(s): INR, PROTIME in the last 168 hours. Cardiac Enzymes: No results for input(s): CKTOTAL, CKMB, CKMBINDEX, TROPONINI in the last 168 hours. BNP (last 3 results) No results for input(s): PROBNP in the last 8760 hours. HbA1C: No results for input(s): HGBA1C in the last 72 hours. CBG: No results for input(s): GLUCAP in the last 168 hours. Lipid Profile: No results  for input(s): CHOL, HDL, LDLCALC, TRIG, CHOLHDL, LDLDIRECT in the last 72 hours. Thyroid Function Tests: No results for input(s): TSH, T4TOTAL, FREET4, T3FREE, THYROIDAB in the last 72 hours. Anemia Panel: No results for input(s): VITAMINB12, FOLATE, FERRITIN, TIBC, IRON, RETICCTPCT in the last 72 hours. Urine analysis:    Component Value Date/Time   COLORURINE YELLOW (A) 09/03/2018 0130   APPEARANCEUR CLOUDY (A) 09/03/2018 0130   APPEARANCEUR Cloudy (A) 03/29/2018 1131   LABSPEC 1.010 09/03/2018 0130   PHURINE 7.0 09/03/2018 0130   GLUCOSEU NEGATIVE 09/03/2018 0130   HGBUR MODERATE (A) 09/03/2018 0130   BILIRUBINUR NEGATIVE 09/03/2018 0130   BILIRUBINUR Negative 03/29/2018 1131   KETONESUR NEGATIVE 09/03/2018 0130   PROTEINUR 100 (A) 09/03/2018 0130   NITRITE NEGATIVE 09/03/2018 0130   LEUKOCYTESUR LARGE (A) 09/03/2018 0130    Radiological Exams on Admission: CT ABDOMEN PELVIS WO CONTRAST  Result Date: 04/23/2021 CLINICAL DATA:  Abdominal distension, abdominal pain EXAM: CT ABDOMEN AND PELVIS WITHOUT CONTRAST TECHNIQUE: Multidetector CT imaging of the abdomen and pelvis was performed following the standard protocol without IV contrast. Unenhanced CT was performed per clinician order. Lack of IV contrast limits sensitivity and specificity, especially for evaluation of abdominal/pelvic solid viscera. COMPARISON:  07/08/2020, 04/23/2021 FINDINGS: Lower chest: No acute pleural or parenchymal lung disease. Hepatobiliary: Unenhanced imaging of the liver and gallbladder demonstrates no acute findings. Subcentimeter hepatic cysts are again noted unchanged. Pancreas: Unremarkable. No pancreatic ductal dilatation or surrounding inflammatory changes. Spleen: Normal in size without focal abnormality. Adrenals/Urinary Tract: Indeterminate 1.8 cm hypodensity right kidney with 5 mm associated calcification unchanged since prior study. No other focal parenchymal renal abnormalities on this unenhanced  exam. There is mild bilateral hydronephrosis and hydroureter, with no evidence of urinary tract calculi. Postsurgical changes from cystectomy with urinary diversion right lower quadrant. The adrenals are unremarkable. Stomach/Bowel: There is a high-grade small bowel obstruction, with transition in the right lower quadrant at the site of a parastomal hernia, reference image 40/2. The small bowel distension and parastomal hernia result in mass effect on the urinary diversion, which may account for the mild obstructive uropathy described above. There is no bowel wall thickening. Colon is decompressed, with scattered diverticulosis throughout the descending and sigmoid colon. No evidence of diverticulitis. Vascular/Lymphatic: Aortic atherosclerosis. No enlarged abdominal or pelvic lymph nodes. Reproductive: Patient is status post prostatectomy. Other: No free fluid or free gas. Since the previous exam, the right parastomal hernia has developed. There is a persistent midline fat containing supraumbilical ventral hernia, as well as wide diastasis of the rectus musculature at the level the umbilicus unchanged. Musculoskeletal: No acute or destructive bony lesions. Reconstructed images demonstrate no additional findings. IMPRESSION: 1. High-grade small bowel obstruction, with transition point at the site of a right mid abdominal parastomal hernia containing a knuckle of mid to distal jejunum. No evidence of bowel  ischemia. 2. Mild bilateral hydronephrosis and hydroureter, which may result from mass effect on the urinary diversion in the right mid abdomen related to the parastomal hernia and small-bowel obstruction described above. 3. Stable right renal cyst or calyceal diverticulum with associated calcification. 4. Postsurgical changes from cysto prostatectomy. 5. Stable midline fat containing ventral hernia and diastasis of the rectus musculature. 6. Distal colonic diverticulosis without diverticulitis. 7.  Choose 1  Electronically Signed   By: Randa Ngo M.D.   On: 04/23/2021 18:09   DG Chest Portable 1 View  Result Date: 04/23/2021 CLINICAL DATA:  Nasogastric tube placement EXAM: PORTABLE CHEST 1 VIEW COMPARISON:  None. FINDINGS: Lungs are clear. No pneumothorax or pleural effusion. Cardiac size within normal limits. Pulmonary vascularity is normal. Nasogastric tube extends into the upper abdomen with its tip overlying the gastric fundus and its proximal side hole roughly 2-3 cm distal to the expected gastroesophageal junction. IMPRESSION: Nasogastric tube tip within the gastric fundus. Advancement of the catheter by 5-10 cm may more optimally position the catheter. Electronically Signed   By: Fidela Salisbury M.D.   On: 04/23/2021 20:06    EKG: Independently reviewed. NA   Assessment/Plan Principal Problem:   SBO (small bowel obstruction) (HCC) Active Problems:   COPD, mild (HCC)   Essential hypertension   GERD without esophagitis   Hyperlipidemia   Bladder cancer (HCC)   CKD (chronic kidney disease), stage III ([place])   70 year old male with past medical history of bladder cancer s/p resection and ileal conduit who presents with 3 days of worsening abdominal pain.  Positive nausea and occasional vomiting.  In [facility] he was diagnosed with high-grade small bowel obstruction with transition point at the site of the right mid abdominal parastomal hernia containing a knuckle of mid to distal jejunum. His vital signs are stable, he has an NG tube in place, his lungs are clear to auscultation bilaterally, heart S1-S2, present, rhythmic, his abdomen is distended, positive bowel sounds, nontender to superficial palpation, no lower extremity edema.  There is no blood work available yet.  CT of the abdomen pelvis size small bowel obstruction described above. Also has mild bilateral hydronephrosis and hydroureter which may result from mass-effect on the urinary diversion in the right  mid abdomen related to parastomal hernia and small bowel obstruction.  Mr. Fontenot is being admitted to the hospital working diagnosis of acute small bowel obstruction related to parastomal hernia, complicated with bilateral hydronephrosis and hydroureter.   Acute small bowel obstruction related to parastomal hernia complicated with bilateral hydronephrosis and hydroureter. Patient has been admitted to the medical ward, continue supportive medical therapy with intravenous fluids, isotonic saline with dextrose at 100 mL/h. Analgesics with as needed morphine 1 mg every 4 hours as needed. Antiemetics with as needed ondansetron. Antiacid therapy with intravenous pantoprazole. Continue nasogastric tube to low intermittent suction.  I have contacted the surgical and urology team for further evaluation.  2.  Hypertension.  Hold on antihypertensive agents. At home patient taking amlodipine and metoprolol.  3.  COPD.  No signs of acute exacerbation, continue tiotropium and inhaled corticosteroids/LABA.  4.  Bladder cancer status post cystectomy.  ALT records personally reviewed, transitional cell bladder cancer neoadjuvant chemotherapy, 4 cycles.  Currently under surveillance every 6 months.  5.  Dyslipidemia.  Resume atorvastatin when patient able to tolerate p.o.  6.  Chronic kidney disease stage IIIa his baseline creatinine is around 1.2-1.3. Currently has acute hydronephrosis due to mass-effect. Check kidney function  today. Correct electrolytes as needed, continue volume repletion with isotonic saline.  Status is: Inpatient  Remains inpatient appropriate because:Inpatient level of care appropriate due to severity of illness  Dispo: The patient is from: Home              Anticipated d/c is to: Home              Patient currently is medically stable to d/c.   Difficult to place patient No    DVT prophylaxis: Enoxaparin   Code Status:    full  Family Communication:   No family at the  bedside     Consults called:   Surgery and Urology   Admission status:  Inpatient    Akshat Minehart Gerome Apley MD [facility]   04/24/2021, 4:57 PM

## 2021-04-24 NOTE — Anesthesia Preprocedure Evaluation (Addendum)
Anesthesia Evaluation  Patient identified by MRN, date of birth, ID band Patient awake    Reviewed: Allergy & Precautions, NPO status , Patient's Chart, lab work & pertinent test results  History of Anesthesia Complications Negative for: history of anesthetic complications  Airway Mallampati: III  TM Distance: >3 FB Neck ROM: Full    Dental  (+) Dental Advisory Given   Pulmonary neg sleep apnea, pneumonia, COPD,  COPD inhaler, Not current smoker, former smoker,    Pulmonary exam normal breath sounds clear to auscultation       Cardiovascular hypertension, Pt. on medications (-) Past MI and (-) CHF Normal cardiovascular exam(-) dysrhythmias (-) Valvular Problems/Murmurs Rhythm:Regular Rate:Normal  IMPRESSIONS    1. The left ventricle has normal systolic function, with an ejection  fraction of 60-65%. The cavity size was normal.  2. The right ventricle has normal systolc function. The cavity was  normal. There is no increase in right ventricular wall thickness.  3. Mild MR  4. No valve vegetation noted.  5. Negative soline contrast bubble study, no PFO or ASD    Neuro/Psych neg Seizures    GI/Hepatic Neg liver ROS, GERD  Medicated and Controlled,  Endo/Other  neg diabetes  Renal/GU Renal InsufficiencyRenal disease     Musculoskeletal   Abdominal   Peds  Hematology   Anesthesia Other Findings   Reproductive/Obstetrics                            Anesthesia Physical  Anesthesia Plan  ASA: 3  Anesthesia Plan: General   Post-op Pain Management:    Induction: Intravenous  PONV Risk Score and Plan: 2 and Dexamethasone and Ondansetron  Airway Management Planned: Oral ETT  Additional Equipment:   Intra-op Plan:   Post-operative Plan: Extubation in OR  Informed Consent: I have reviewed the patients History and Physical, chart, labs and discussed the procedure including the  risks, benefits and alternatives for the proposed anesthesia with the patient or authorized representative who has indicated his/her understanding and acceptance.     Dental advisory given  Plan Discussed with: CRNA and Anesthesiologist  Anesthesia Plan Comments:         Anesthesia Quick Evaluation

## 2021-04-24 NOTE — Consult Note (Signed)
Consulting Physician: Nickola Major Daegan Arizmendi  Referring Provider: Dr. Cathlean Sauer  Chief Complaint: Abdominal pain, nausea and vomiting  Reason for Consult: Parastomal hernia with bowel obstruction   Subjective   HPI: Steve Gilbert is an 70 y.o. male who is here for abdominal pain.  He has developed lower abdominal pain, nausea, vomiting and abdominal bloating over the last few days.  His last bowel movement was 3 days ago.  He originally presented to [facility] but was transferred to [facility] for evaluation by our surgery team.    Past Medical History:  Diagnosis Date   Acid reflux    Allergy    Cancer of bladder neck ([place]) 02/2018   Chemo tx's and surgical resection.   COPD (chronic obstructive pulmonary disease) (HCC)    High cholesterol    Hyperlipidemia 04/03/2018   Hypertension    Pneumonia     Past Surgical History:  Procedure Laterality Date   CYSTOSCOPY W/ URETERAL STENT REMOVAL Right 08/10/2018   Procedure: CYSTOSCOPY WITH STENT REMOVAL;  Surgeon: Alexis Frock, MD;  Location: [facility];  Service: Urology;  Laterality: Right;   CYSTOSCOPY WITH BIOPSY N/A 04/04/2018   Procedure: CYSTOSCOPY WITH TURBT;  Surgeon: Hollice Espy, MD;  Location: [facility];  Service: Urology;  Laterality: N/A;   CYSTOSCOPY WITH URETEROSCOPY AND STENT PLACEMENT Right 04/04/2018   Procedure: CYSTOSCOPY WITH URETEROSCOPY AND STENT PLACEMENT;  Surgeon: Hollice Espy, MD;  Location: [facility] OR[facility];  Service: Urology;  Laterality: Right;   NASAL SINUS SURGERY     PORTA CATH INSERTION N/A 04/23/2018   Procedure: PORTA CATH INSERTION;  Surgeon: Algernon Huxley, MD;  Location: [place] [facility];  Service: Cardiovascular;  Laterality: N/A;   PORTA CATH REMOVAL N/A 09/04/2018   Procedure: PORTA CATH REMOVAL;  Surgeon: Katha Cabal, MD;  Location: [place] [facility];  Service: Cardiovascular;  Laterality: N/A;   TEE WITHOUT CARDIOVERSION N/A 09/05/2018   Procedure: TRANSESOPHAGEAL ECHOCARDIOGRAM (TEE);   Surgeon: Minna Merritts, MD;  Location: [facility] OR[facility];  Service: Cardiovascular;  Laterality: N/A;   TRANSURETHRAL RESECTION OF PROSTATE N/A 04/04/2018   Procedure: TRANSURETHRAL RESECTION OF THE PROSTATE (TURP);  Surgeon: Hollice Espy, MD;  Location: [facility];  Service: Urology;  Laterality: N/A;   URETERAL BIOPSY Right 04/04/2018   Procedure: URETERAL BIOPSY;  Surgeon: Hollice Espy, MD;  Location: [facility] OR[facility];  Service: Urology;  Laterality: Right;    Family History  Problem Relation Age of Onset   Diabetes Mother    Heart disease Father    Diabetes Brother    Heart attack Brother    Heart disease Brother     Social:  reports that he quit smoking about 13 years ago. His smoking use included cigarettes. He has a 45.00 pack-year smoking history. He has quit using smokeless tobacco. He reports current alcohol use of about 7.0 standard drinks per week. He reports that he does not currently use drugs.  Allergies:  Allergies  Allergen Reactions   Contrast Media [Iodinated Diagnostic Agents] Hives, Itching and Rash    On 07/08/20 pt reports that even with premedication patient breaks out into hives 48 hours post contrast. Radiologist and ordering provider aware and for this contrast patient has been on Prednisone a few days prior and will continue prednisone for 14 days post contrast.     Medications: Current Outpatient Medications  Medication Instructions   amLODipine (NORVASC) 10 mg, Oral, Daily   aspirin EC 81 mg, Oral, Daily   atorvastatin (LIPITOR) 20  mg, Oral, Daily   azelastine (ASTELIN) 0.1 % nasal spray 1 spray, Each Nare, 2 times daily   fluticasone-salmeterol (ADVAIR) 250-50 MCG/ACT AEPB 1 puff, Inhalation, 2 times daily   metoprolol tartrate (LOPRESSOR) 25 MG tablet TAKE 1 TABLET(25 MG) BY MOUTH TWICE DAILY   Multiple Vitamins-Minerals (MULTIVITAMIN WITH MINERALS) tablet 1 tablet, Oral, Daily   pantoprazole (PROTONIX) 40 mg, Oral, Every evening   Spiriva HandiHaler 18 mcg,  Inhalation, Daily    ROS - all of the below systems have been reviewed with the patient and positives are indicated with bold text General: chills, fever or night sweats Eyes: blurry vision or double vision ENT: epistaxis or sore throat Allergy/Immunology: itchy/watery eyes or nasal congestion Hematologic/Lymphatic: bleeding problems, blood clots or swollen lymph nodes Endocrine: temperature intolerance or unexpected weight changes Breast: new or changing breast lumps or nipple discharge Resp: cough, shortness of breath, or wheezing CV: chest pain or dyspnea on exertion GI: as per HPI GU: dysuria, trouble voiding, or hematuria MSK: joint pain or joint stiffness Neuro: TIA or stroke symptoms Derm: pruritus and skin lesion changes Psych: anxiety and depression  Objective   PE Blood pressure (!) 141/92, pulse (!) 110, temperature 100.1 F (37.8 C), temperature source Oral, resp. rate 18, SpO2 98 %. Constitutional: NAD; conversant; no deformities Eyes: Moist conjunctiva; no lid lag; anicteric; PERRL Neck: Trachea midline; no thyromegaly Lungs: Normal respiratory effort; no tactile fremitus CV: RRR; no palpable thrills; no pitting edema GI: Abd Abdominal distention, fullness around urostomy but unable to palpate clear edges of herniated bowel on exam, some tenderness in the area on exam; no palpable hepatosplenomegaly MSK: Normal range of motion of extremities; no clubbing/cyanosis Psychiatric: Appropriate affect; alert and oriented x3 Lymphatic: No palpable cervical or axillary lymphadenopathy  Results for orders placed or performed during the hospital encounter of 04/23/21 (from the past 24 hour(s))  Resp Panel by RT-PCR (Flu A&B, Covid) Nasopharyngeal Swab     Status: None   Collection Time: 04/23/21  7:20 PM   Specimen: Nasopharyngeal Swab; Nasopharyngeal(NP) swabs in vial transport medium  Result Value Ref Range   SARS Coronavirus 2 by RT PCR NEGATIVE NEGATIVE   Influenza A  by PCR NEGATIVE NEGATIVE   Influenza B by PCR NEGATIVE NEGATIVE    Imaging Orders    CT abdomen/pelvis 04/23/21: 1. High-grade small bowel obstruction, with transition point at the site of a right mid abdominal parastomal hernia containing a knuckle of mid to distal jejunum. No evidence of bowel ischemia. 2. Mild bilateral hydronephrosis and hydroureter, which may result from mass effect on the urinary diversion in the right mid abdomen related to the parastomal hernia and small-bowel obstruction described above. 3. Stable right renal cyst or calyceal diverticulum with associated calcification. 4. Postsurgical changes from cysto prostatectomy. 5. Stable midline fat containing ventral hernia and diastasis of the rectus musculature. 6. Distal colonic diverticulosis without diverticulitis.   Assessment and Plan   Steve Gilbert is an 70 y.o. male with a history of robotic radical cystoprostatectomy with ileal conduit and right inguinal hernia repair for bladder cancer by Dr Tresa Moore in 2020.  On CT he has developed a parastomal hernia with high grade bowel obstruction and some signs of partial obstruction of the ileal conduit at this level.  He also has a fat containing midline incisional hernia.    I recommended exploratory laparotomy with repair of incisional and parastomal hernias.  I explained my plan, primary goal being to relieve the obstruction.  Secondary  goal to see what can be done to address the hernia - either tightening the abdominal wall defect primarily around the ileal conduit, a keyhole repair mesh repair or a Sugarbaker mesh repair.  I described the procedure as well as its risks, benefits and alternatives.  After a full discussion and all questions answered the patient granted consent to proceed.  We will proceed to the OR emergently.  The case was discussed with Dr. Jeffie Pollock of urology - on call for Dr. Zettie Pho group.  It appears the urologic abnormalities on imaging are  likely secondary to the parastomal hernia.  I don't anticipate requiring urologic assistance during the case but he offered help if needed.   Felicie Morn, MD  [facility] Use [email or web address] to contact on call provider

## 2021-04-24 NOTE — ED Notes (Signed)
Called Pt placement for update on bed assignment. Per Stanton Kidney, no bed will be available until after discharges in the AM at [facility].

## 2021-04-24 NOTE — ED Notes (Signed)
Called [facility] @ 2:10 for update. Still waiting bed assignment.

## 2021-04-24 NOTE — ED Notes (Signed)
Called to check on bed status still waiting for bed assignments, spoke to [place] and she stated waiting for discharges at [place]

## 2021-04-24 NOTE — ED Notes (Signed)
Patient accepted to [facility] 1307  [facility] to transfer  report [id number]

## 2021-04-25 ENCOUNTER — Encounter (HOSPITAL_COMMUNITY): Payer: Self-pay | Admitting: Internal Medicine

## 2021-04-25 LAB — CBC
HCT: 41.5 % (ref 39.0–52.0)
Hemoglobin: 13.5 g/dL (ref 13.0–17.0)
MCH: 30.8 pg (ref 26.0–34.0)
MCHC: 32.5 g/dL (ref 30.0–36.0)
MCV: 94.7 fL (ref 80.0–100.0)
Platelets: 338 10*3/uL (ref 150–400)
RBC: 4.38 MIL/uL (ref 4.22–5.81)
RDW: 13.2 % (ref 11.5–15.5)
WBC: 5.5 10*3/uL (ref 4.0–10.5)
nRBC: 0 % (ref 0.0–0.2)

## 2021-04-25 LAB — BASIC METABOLIC PANEL
Anion gap: 8 (ref 5–15)
BUN: 34 mg/dL — ABNORMAL HIGH (ref 8–23)
CO2: 29 mmol/L (ref 22–32)
Calcium: 8.5 mg/dL — ABNORMAL LOW (ref 8.9–10.3)
Chloride: 105 mmol/L (ref 98–111)
Creatinine, Ser: 1.6 mg/dL — ABNORMAL HIGH (ref 0.61–1.24)
GFR, Estimated: 46 mL/min — ABNORMAL LOW (ref >60)
Glucose, Bld: 240 mg/dL — ABNORMAL HIGH (ref 70–99)
Potassium: 4.4 mmol/L (ref 3.5–5.1)
Sodium: 142 mmol/L (ref 135–145)

## 2021-04-25 MED ORDER — ACETAMINOPHEN 325 MG PO TABS
650.0000 mg | ORAL_TABLET | Freq: Four times a day (QID) | ORAL | Status: DC
  Administered 2021-04-29: 650 mg via ORAL
  Filled 2021-04-25 (×2): qty 2

## 2021-04-25 MED ORDER — MORPHINE SULFATE (PF) 2 MG/ML IV SOLN
2.0000 mg | INTRAVENOUS | Status: DC | PRN
  Administered 2021-04-25 – 2021-04-27 (×13): 2 mg via INTRAVENOUS
  Filled 2021-04-25 (×13): qty 1

## 2021-04-25 MED ORDER — UMECLIDINIUM BROMIDE 62.5 MCG/INH IN AEPB
1.0000 | INHALATION_SPRAY | Freq: Every day | RESPIRATORY_TRACT | Status: DC
  Administered 2021-04-25 – 2021-04-29 (×6): 1 via RESPIRATORY_TRACT
  Filled 2021-04-25: qty 7

## 2021-04-25 MED ORDER — MOMETASONE FURO-FORMOTEROL FUM 200-5 MCG/ACT IN AERO
2.0000 | INHALATION_SPRAY | Freq: Two times a day (BID) | RESPIRATORY_TRACT | Status: DC
  Administered 2021-04-25 – 2021-04-29 (×9): 2 via RESPIRATORY_TRACT
  Filled 2021-04-25: qty 8.8

## 2021-04-25 MED ORDER — SODIUM CHLORIDE 0.45 % IV SOLN: INTRAVENOUS | Status: DC

## 2021-04-25 NOTE — Progress Notes (Signed)
Progress Note: General Surgery Service   Chief Complaint/Subjective: Doing well POD1.  NG in place.  Having abdominal pain around incision and when flexing abdominal wall musculature.  Objective: Vital signs in last 24 hours: Temp:  [97.5 F (36.4 C)-100.1 F (37.8 C)] 97.7 F (36.5 C) (10/09 0333) Pulse Rate:  [95-113] 102 (10/09 0826) Resp:  [16-24] 18 (10/09 0826) BP: (118-157)/(85-100) 139/95 (10/09 0826) SpO2:  [88 %-100 %] 91 % (10/09 0826) Weight:  [93.8 kg] 93.8 kg (10/09 0333) Last BM Date: 04/20/21  Intake/Output from previous day: 10/08 0701 - 10/09 0700 In: 2996.2 [P.O.:80; I.V.:2466.2; IV Piggyback:450] Out: 78 [Urine:650; Emesis/NG output:250; Drains:120; Blood:50] Intake/Output this shift: Total I/O In: 93 [P.O.:60] Out: 160 [Urine:50; Emesis/NG output:100; Drains:10]  GI: Abd Incision c/d/I w/ glue, urostomy healthy with clear yellow urine  Lab Results: CBC  Recent Labs    04/24/21 1818 04/25/21 0447  WBC 5.0 5.5  HGB 15.3 13.5  HCT 47.0 41.5  PLT 378 338   BMET Recent Labs    04/24/21 1818 04/25/21 0447  NA 142 142  K 4.1 4.4  CL 102 105  CO2 28 29  GLUCOSE 113* 240*  BUN 36* 34*  CREATININE 1.55* 1.60*  CALCIUM 9.5 8.5*   PT/INR No results for input(s): LABPROT, INR in the last 72 hours. ABG No results for input(s): PHART, HCO3 in the last 72 hours.  Invalid input(s): PCO2, PO2  Anti-infectives: Anti-infectives (From admission, onward)    Start     Dose/Rate Route Frequency Ordered Stop   04/24/21 1920  ceFAZolin (ANCEF) 2-4 GM/100ML-% IVPB       Note to Pharmacy: Herbie Drape   : cabinet override      04/24/21 1920 04/25/21 0729       Medications: Scheduled Meds:  acetaminophen  650 mg Oral Q6H   enoxaparin (LOVENOX) injection  40 mg Subcutaneous Q24H   gabapentin  300 mg Oral TID   metoCLOPramide (REGLAN) injection  10 mg Intravenous Q6H   mometasone-formoterol  2 puff Inhalation BID   pantoprazole (PROTONIX) IV   40 mg Intravenous Q24H   sodium chloride flush  3 mL Intravenous Q12H   umeclidinium bromide  1 puff Inhalation Daily   Continuous Infusions:  sodium chloride     dextrose 5 % and 0.9% NaCl 100 mL/hr at 04/24/21 1808   methocarbamol (ROBAXIN) IV 500 mg (04/25/21 0442)   PRN Meds:.sodium chloride, HYDROmorphone (DILAUDID) injection, methocarbamol (ROBAXIN) IV, morphine injection, ondansetron **OR** ondansetron (ZOFRAN) IV, oxyCODONE, oxyCODONE, prochlorperazine, simethicone, sodium chloride flush  Assessment/Plan: s/p Procedure(s): EXPLORATORY LAPAROTOMY; PARASTOMAL HERNIA REPAIR Sugerbaker technique  incisional hernia repair, right sided TAR 04/24/2021  Doing well POD1 JP serosanguinous, Plan to remove JP drain on day of discharge Urine clear Pain as expected Continue NPO with NG till passing flatus then slowly advance diet Ambulate/increase activity   LOS: 1 day     Felicie Morn, MD  [facility] Use [email or web address] to contact on call provider

## 2021-04-25 NOTE — Progress Notes (Signed)
Yellow MEWS initiated. MD contacted to update. No new orders at this time. Pain PRN meds were given prior to this vital check. Patient otherwise stable.

## 2021-04-25 NOTE — Progress Notes (Signed)
PROGRESS NOTE    Steve Gilbert  PNT:[id number] DOB: 06/06/1951 DOA: 04/24/2021 PCP: Kirk Ruths, MD    Brief Narrative:  Mr. Heacock was admitted to the hospital with the working diagnosis of acute small bowel obstruction related to parastomal hernia, complicated with bilateral hydronephrosis and hydroureter. Now sp parastomal hernia repair.   70 year old male with past medical history of bladder cancer s/p resection and ileal conduit who presents with 3 days of worsening abdominal pain.  Positive nausea and occasional vomiting.  In [facility] he was diagnosed with high-grade small bowel obstruction with transition point at the site of the right mid abdominal parastomal hernia containing a knuckle of mid to distal jejunum. His vital signs are stable, he has an NG tube in place, his lungs are clear to auscultation bilaterally, heart S1-S2, present, rhythmic, his abdomen is distended, positive bowel sounds, nontender to superficial palpation, no lower extremity edema.   There is no blood work available yet.   CT of the abdomen pelvis size small bowel obstruction described above. Also has mild bilateral hydronephrosis and hydroureter which may result from mass-effect on the urinary diversion in the right mid abdomen related to parastomal hernia and small bowel obstruction.  Patient was evaluated by surgery and underwent hernia repair on 04/24/21. Developed post operative ileus.   Assessment & Plan:   Principal Problem:   SBO (small bowel obstruction) (HCC) Active Problems:   COPD, mild (HCC)   Essential hypertension   GERD without esophagitis   Hyperlipidemia   Bladder cancer (HCC)   CKD (chronic kidney disease), stage III (HCC)   Acute small bowel obstruction related to parastomal hernia complicated with bilateral hydronephrosis and hydroureter. Sp hernia repair yesterday.  This am with no nausea or vomiting, positive abdominal pain, no flatus or  bowel movement.  Has NG tube in place for post operative ileus. Output 250 ml Has a abdominal drain in place.   Continue pain control with hydromorphone 1 mg every 3 hours as needed, PRN ondansetron for nausea control. Continue with antiacid therapy with intravenous pantoprazole. Scheduled metoclopramide.  NG tube to low intermittent suction. Continue NPO until recovery of bowel function.    2.  Hypertension.  (at home on amlodipine and metoprolol). Continue to hold on antihypertensive medications.    3.  COPD.  No acute exacerbation. On tiotropium and inhaled corticosteroids/LABA.   4.  Bladder cancer status post cystectomy.  Old records personally reviewed, transitional cell bladder cancer neoadjuvant chemotherapy, 4 cycles.  Currently under surveillance every 6 months.  Follow up as outpatient with urology    5.  Dyslipidemia.   On atorvastatin when patient able to tolerate p.o.   6.  AKI on Chronic kidney disease stage IIIa (base cr 1,3 to 1,4) Renal function with serum cr up to 1,6 from  1.55 with K at 4,4 and serum bicarbonate at 29, Na is 142 and Cl 105.  Urine output 650 ml over last 24 hrs.   Extrinsic obstruction addressed with hernia repair.   Plan to continue volume repletion with 0,45 saline to prevent hypernatremia and hyperchloremia. Reduce rate to 75 ml per hr to prevent volume overload.  If worsening renal function will do a renal US rule out persistent obstruction.  Follow up renal function in am, avoid hypotension and nephrotoxic medications.   7. Reactive hyperglycemia. Fasting glucose this am up to 240. Patient is NPO.  Will hold on dextrose from IV fluids for now.   Patient continue  to be at high risk for worsening ileus and renal failure   Status is: Inpatient  Remains inpatient appropriate because:Inpatient level of care appropriate due to severity of illness  Dispo: The patient is from: Home              Anticipated d/c is to: Home               Patient currently is not medically stable to d/c.   Difficult to place patient No   DVT prophylaxis: Enoxaparin   Code Status:    full  Family Communication:   No family at the bedside     Consultants:  Surgery and Urology   Procedures:  Hernia repair 10/08    Subjective: Patient has abdominal distention and intermittent pain, no nausea or vomiting, no bowel movements or flatus.   Objective: Vitals:   04/25/21 0218 04/25/21 0333 04/25/21 0750 04/25/21 0826  BP: (!) 154/98 (!) 133/97  (!) 139/95  Pulse: 100 99  (!) 102  Resp: 17 17  18   Temp: 97.6 F (36.4 C) 97.7 F (36.5 C)    TempSrc: Oral Oral    SpO2: 97% 96% (!) 88% 91%  Weight:  93.8 kg    Height:  5\' 7"  (1.702 m)      Intake/Output Summary (Last 24 hours) at 04/25/2021 1055 Last data filed at 04/25/2021 1049 Gross per 24 hour  Intake 3056.24 ml  Output 1630 ml  Net 1426.24 ml   Filed Weights   04/25/21 0333  Weight: 93.8 kg    Examination:   General: deconditioned  Neurology: Awake and alert, non focal  E ENT: mild pallor, no icterus, oral mucosa moist. NG tube in place.  Cardiovascular: No JVD. S1-S2 present, rhythmic, no gallops, rubs, or murmurs. No lower extremity edema. Pulmonary: vesicular breath sounds bilaterally, adequate air movement, no wheezing, rhonchi or rales. Gastrointestinal. Abdomen distended, no bowel sounds, not tender to superficial palpation. Drain in place, with dressing in place to surgical wounds.  Skin. No rashes Musculoskeletal: no joint deformities     Data Reviewed: I have personally reviewed following labs and imaging studies  CBC: Recent Labs  Lab 04/24/21 1818 04/25/21 0447  WBC 5.0 5.5  HGB 15.3 13.5  HCT 47.0 41.5  MCV 93.1 94.7  PLT 378 259   Basic Metabolic Panel: Recent Labs  Lab 04/24/21 1818 04/25/21 0447  NA 142 142  K 4.1 4.4  CL 102 105  CO2 28 29  GLUCOSE 113* 240*  BUN 36* 34*  CREATININE 1.55* 1.60*  CALCIUM 9.5 8.5*    GFR: Estimated Creatinine Clearance: 46.9 mL/min (A) (by C-G formula based on SCr of 1.6 mg/dL (H)). Liver Function Tests: No results for input(s): AST, ALT, ALKPHOS, BILITOT, PROT, ALBUMIN in the last 168 hours. No results for input(s): LIPASE, AMYLASE in the last 168 hours. No results for input(s): AMMONIA in the last 168 hours. Coagulation Profile: No results for input(s): INR, PROTIME in the last 168 hours. Cardiac Enzymes: No results for input(s): CKTOTAL, CKMB, CKMBINDEX, TROPONINI in the last 168 hours. BNP (last 3 results) No results for input(s): PROBNP in the last 8760 hours. HbA1C: No results for input(s): HGBA1C in the last 72 hours. CBG: No results for input(s): GLUCAP in the last 168 hours. Lipid Profile: No results for input(s): CHOL, HDL, LDLCALC, TRIG, CHOLHDL, LDLDIRECT in the last 72 hours. Thyroid Function Tests: No results for input(s): TSH, T4TOTAL, FREET4, T3FREE, THYROIDAB in the last 72 hours. Anemia  Panel: No results for input(s): VITAMINB12, FOLATE, FERRITIN, TIBC, IRON, RETICCTPCT in the last 72 hours.    Radiology Studies: I have reviewed all of the imaging during this hospital visit personally     Scheduled Meds:  acetaminophen  650 mg Oral Q6H   enoxaparin (LOVENOX) injection  40 mg Subcutaneous Q24H   gabapentin  300 mg Oral TID   metoCLOPramide (REGLAN) injection  10 mg Intravenous Q6H   mometasone-formoterol  2 puff Inhalation BID   pantoprazole (PROTONIX) IV  40 mg Intravenous Q24H   sodium chloride flush  3 mL Intravenous Q12H   umeclidinium bromide  1 puff Inhalation Daily   Continuous Infusions:  sodium chloride     dextrose 5 % and 0.9% NaCl 100 mL/hr at 04/24/21 1808   methocarbamol (ROBAXIN) IV 500 mg (04/25/21 0442)     LOS: 1 day        Hayle Parisi Gerome Apley, MD

## 2021-04-26 ENCOUNTER — Encounter (HOSPITAL_COMMUNITY): Payer: Self-pay | Admitting: Surgery

## 2021-04-26 LAB — CBC
HCT: 40.4 % (ref 39.0–52.0)
Hemoglobin: 12.9 g/dL — ABNORMAL LOW (ref 13.0–17.0)
MCH: 30.8 pg (ref 26.0–34.0)
MCHC: 31.9 g/dL (ref 30.0–36.0)
MCV: 96.4 fL (ref 80.0–100.0)
Platelets: 355 10*3/uL (ref 150–400)
RBC: 4.19 MIL/uL — ABNORMAL LOW (ref 4.22–5.81)
RDW: 13.6 % (ref 11.5–15.5)
WBC: 4.9 10*3/uL (ref 4.0–10.5)
nRBC: 0 % (ref 0.0–0.2)

## 2021-04-26 LAB — BASIC METABOLIC PANEL
Anion gap: 5 (ref 5–15)
BUN: 26 mg/dL — ABNORMAL HIGH (ref 8–23)
CO2: 30 mmol/L (ref 22–32)
Calcium: 8.4 mg/dL — ABNORMAL LOW (ref 8.9–10.3)
Chloride: 108 mmol/L (ref 98–111)
Creatinine, Ser: 1.47 mg/dL — ABNORMAL HIGH (ref 0.61–1.24)
GFR, Estimated: 51 mL/min — ABNORMAL LOW (ref >60)
Glucose, Bld: 122 mg/dL — ABNORMAL HIGH (ref 70–99)
Potassium: 3.9 mmol/L (ref 3.5–5.1)
Sodium: 143 mmol/L (ref 135–145)

## 2021-04-26 LAB — GLUCOSE, CAPILLARY
Glucose-Capillary: 86 mg/dL (ref 70–99)
Glucose-Capillary: 91 mg/dL (ref 70–99)
Glucose-Capillary: 93 mg/dL (ref 70–99)
Glucose-Capillary: 95 mg/dL (ref 70–99)

## 2021-04-26 LAB — HEMOGLOBIN A1C
Hgb A1c MFr Bld: 5.7 % — ABNORMAL HIGH (ref 4.8–5.6)
Mean Plasma Glucose: 116.89 mg/dL

## 2021-04-26 LAB — MAGNESIUM: Magnesium: 2.4 mg/dL (ref 1.7–2.4)

## 2021-04-26 MED ORDER — HYDRALAZINE HCL 20 MG/ML IJ SOLN: 10.0000 mg | Freq: Four times a day (QID) | INTRAMUSCULAR | Status: DC | PRN

## 2021-04-26 MED ORDER — INSULIN ASPART 100 UNIT/ML IJ SOLN
0.0000 [IU] | INTRAMUSCULAR | Status: DC
  Administered 2021-04-28: 2 [IU] via SUBCUTANEOUS

## 2021-04-26 NOTE — Progress Notes (Signed)
Patient ID: Steve Gilbert, male   DOB: 13-Sep-1950, 70 y.o.   MRN: [id number] [facility] Surgery Progress Note  2 Days Post-Op  Subjective: CC-  Feels about the same. Feels bloated. No flatus or BM. NG with 1350cc last 24 hours.  Objective: Vital signs in last 24 hours: Temp:  [97.4 F (36.3 C)-98.6 F (37 C)] 97.4 F (36.3 C) (10/10 1207) Pulse Rate:  [101-120] 101 (10/10 1207) Resp:  [18-20] 20 (10/10 1207) BP: (134-170)/(85-102) 148/93 (10/10 1207) SpO2:  [86 %-96 %] 96 % (10/10 1207) FiO2 (%):  [2 %] 2 % (10/10 0528) Last BM Date: 04/20/21  Intake/Output from previous day: 10/09 0701 - 10/10 0700 In: 1659.6 [P.O.:240; I.V.:1369.6; IV Piggyback:50] Out: 6629 [Urine:3050; Emesis/NG output:1350; Drains:135] Intake/Output this shift: Total I/O In: 120 [P.O.:60; NG/GT:60] Out: 500 [Drains:500]  PE: Gen:  Alert, NAD, pleasant Pulm: rate and effort normal Abd: distended but soft, appropriately tender, hypoactive bowel sounds, incision cdi, urostomy healthy with clear yellow urine  Lab Results:  Recent Labs    04/25/21 0447 04/26/21 0428  WBC 5.5 4.9  HGB 13.5 12.9*  HCT 41.5 40.4  PLT 338 355   BMET Recent Labs    04/25/21 0447 04/26/21 0428  NA 142 143  K 4.4 3.9  CL 105 108  CO2 29 30  GLUCOSE 240* 122*  BUN 34* 26*  CREATININE 1.60* 1.47*  CALCIUM 8.5* 8.4*   PT/INR No results for input(s): LABPROT, INR in the last 72 hours. CMP     Component Value Date/Time   NA 143 04/26/2021 0428   K 3.9 04/26/2021 0428   CL 108 04/26/2021 0428   CO2 30 04/26/2021 0428   GLUCOSE 122 (H) 04/26/2021 0428   BUN 26 (H) 04/26/2021 0428   CREATININE 1.47 (H) 04/26/2021 0428   CALCIUM 8.4 (L) 04/26/2021 0428   PROT 7.5 03/04/2021 1015   ALBUMIN 4.1 03/04/2021 1015   AST 29 03/04/2021 1015   ALT 28 03/04/2021 1015   ALKPHOS 72 03/04/2021 1015   BILITOT 0.9 03/04/2021 1015   GFRNONAA 51 (L) 04/26/2021 0428   GFRAA >60 12/26/2019 0929   Lipase  No  results found for: LIPASE     Studies/Results: No results found.  Anti-infectives: Anti-infectives (From admission, onward)    Start     Dose/Rate Route Frequency Ordered Stop   04/24/21 1920  ceFAZolin (ANCEF) 2-4 GM/100ML-% IVPB       Note to Pharmacy: Herbie Drape   : cabinet override      04/24/21 1920 04/25/21 0729        Assessment/Plan Bowel obstruction secondary to an incarcerated parastomal hernia, incisional hernia  POD#2 S/p Parastomal hernia repair - Sugarbaker technique, Incisional hernia repair, Bilateral posterior rectus sheath myofascial release, Right sided transversus abdominus release, Lysis of adhesions (60 minutes) 10/8 Dr. Thermon Leyland - continue NPO/ NGT to LIWS and await return in bowel function - JP serosanguinous, Plan to remove JP drain on day of discharge - mobilize  ID - ancef periop FEN - IVF, NPO/NGT to LIWS VTE - lovenox Foley - none  HTN COPD HLD CKD Bladder cancer s/p cystectomy   LOS: 2 days    Wellington Hampshire, [facility] Surgery 04/26/2021, 12:07 PM Please see Amion for pager number during day hours 7:00am-4:30pm

## 2021-04-26 NOTE — Progress Notes (Signed)
PROGRESS NOTE    Steve Gilbert  WUJ:[id number] DOB: 1951-01-07 DOA: 04/24/2021 PCP: Kirk Ruths, MD    Brief Narrative:  Steve Gilbert was admitted to the hospital with the working diagnosis of acute small bowel obstruction related to parastomal hernia, complicated with bilateral hydronephrosis and hydroureter. Now sp parastomal hernia repair.    70 year old male with past medical history of bladder cancer s/p resection and ileal conduit who presents with 3 days of worsening abdominal pain.  Positive nausea and occasional vomiting.  In [facility] he was diagnosed with high-grade small bowel obstruction with transition point at the site of the right mid abdominal parastomal hernia containing a knuckle of mid to distal jejunum. His vital signs are stable, he has an NG tube in place, his lungs are clear to auscultation bilaterally, heart S1-S2, present, rhythmic, his abdomen is distended, positive bowel sounds, nontender to superficial palpation, no lower extremity edema.   There is no blood work available yet.   CT of the abdomen pelvis size small bowel obstruction described above. Also has mild bilateral hydronephrosis and hydroureter which may result from mass-effect on the urinary diversion in the right mid abdomen related to parastomal hernia and small bowel obstruction.   Patient was evaluated by surgery and underwent hernia repair on 04/24/21. Developed post operative ileus.    Assessment & Plan:   Principal Problem:   SBO (small bowel obstruction) (HCC) Active Problems:   COPD, mild (HCC)   Essential hypertension   GERD without esophagitis   Hyperlipidemia   Bladder cancer (HCC)   CKD (chronic kidney disease), stage III (HCC)     Acute small bowel obstruction related to parastomal hernia complicated with bilateral hydronephrosis and hydroureter. Sp hernia repair yesterday.  No nausea or vomiting, continue to have NG tube in place to low  intermittent suction.  NG tube output 13,50 ml over last 24 hrs.  No flatus and no bowel movement.    Plan to continue pain control with hydromorphone 1 mg every 3 hours as needed,  Nausea control with ondansetron Continue with metoclopramide and pantoprazole.   Continue NPO per surgery recommendations Out of bed and ambulate as tolerated.    2.  Hypertension.  (at home on amlodipine and metoprolol). systolic blood pressure 956 to 170 mmHg Added hydralazine 10 mg as needed IV for blood pressure systolic 213 mmHg.    3.  COPD.   No clinical signs of acute exacerbation. Continue with tiotropium and inhaled corticosteroids/LABA.   4.  Bladder cancer status post cystectomy.  Old records personally reviewed, transitional cell bladder cancer neoadjuvant chemotherapy, 4 cycles.  Currently under surveillance every 6 months.   Follow up as outpatient with urology    5.  Dyslipidemia.   Continue with atorvastatin when patient able to tolerate p.o.   6.  AKI on Chronic kidney disease stage IIIa (base cr 1,3 to 1,4) Patient continue to be NPO, high output on NG tube 1,350 ml.  Na is 143 and k is 3,9, with serum bicarbonate at 39 and serum cr at 1,47   Continue volume repletion with 0,45 % saline at 75 ml per hr, follow up renal function in am. Avoid nephrotoxic medications or hypotension.    7. Reactive hyperglycemia.  Glucose this am 122, capillary 91,  Continue to hold on IV dextrose for now.   Patient continue to be at high risk for worsening ileus.   Status is: Inpatient  Remains inpatient appropriate because:Inpatient level of  care appropriate due to severity of illness  Dispo: The patient is from: Home              Anticipated d/c is to: Home              Patient currently is not medically stable to d/c.   Difficult to place patient No   DVT prophylaxis: Enoxaparin   Code Status:    full  Family Communication:   No family at the bedside     Consultants:  Surgery   Urology has been notified.   Procedures:  Hernia repair 04/24/21     Subjective: Patient with no nausea or vomiting, NG tube in place, no chest pain or dyspnea, abdominal pain controlled with analgesics. No flatus and no bowel movement.   Objective: Vitals:   04/26/21 0748 04/26/21 0902 04/26/21 1207 04/26/21 1500  BP:  (!) 152/96 (!) 148/93   Pulse:  (!) 104 (!) 101   Resp:  20 20   Temp:  (!) 97.4 F (36.3 C) (!) 97.4 F (36.3 C)   TempSrc:  Oral Oral   SpO2: 94% 96% 96% 97%  Weight:      Height:        Intake/Output Summary (Last 24 hours) at 04/26/2021 1546 Last data filed at 04/26/2021 1113 Gross per 24 hour  Intake 1627.09 ml  Output 4615 ml  Net -2987.91 ml   Filed Weights   04/25/21 0333  Weight: 93.8 kg    Examination:   General: Not in pain or dyspnea, deconditioned  Neurology: Awake and alert, non focal  E ENT: mild pallor, no icterus, oral mucosa moist, ng tube in place.  Cardiovascular: No JVD. S1-S2 present, rhythmic, no gallops, rubs, or murmurs. No lower extremity edema. Pulmonary: positive breath sounds bilaterally, adequate air movement, no wheezing, rhonchi or rales. Gastrointestinal. Abdomen distended but not tender to superficial palpation. No bowel signs. Surgical wound with dressing in place.  Skin. No rashes Musculoskeletal: no joint deformities     Data Reviewed: I have personally reviewed following labs and imaging studies  CBC: Recent Labs  Lab 04/24/21 1818 04/25/21 0447 04/26/21 0428  WBC 5.0 5.5 4.9  HGB 15.3 13.5 12.9*  HCT 47.0 41.5 40.4  MCV 93.1 94.7 96.4  PLT 378 338 297   Basic Metabolic Panel: Recent Labs  Lab 04/24/21 1818 04/25/21 0447 04/26/21 0428  NA 142 142 143  K 4.1 4.4 3.9  CL 102 105 108  CO2 28 29 30   GLUCOSE 113* 240* 122*  BUN 36* 34* 26*  CREATININE 1.55* 1.60* 1.47*  CALCIUM 9.5 8.5* 8.4*  MG  --   --  2.4   GFR: Estimated Creatinine Clearance: 51.1 mL/min (A) (by C-G formula based  on SCr of 1.47 mg/dL (H)). Liver Function Tests: No results for input(s): AST, ALT, ALKPHOS, BILITOT, PROT, ALBUMIN in the last 168 hours. No results for input(s): LIPASE, AMYLASE in the last 168 hours. No results for input(s): AMMONIA in the last 168 hours. Coagulation Profile: No results for input(s): INR, PROTIME in the last 168 hours. Cardiac Enzymes: No results for input(s): CKTOTAL, CKMB, CKMBINDEX, TROPONINI in the last 168 hours. BNP (last 3 results) No results for input(s): PROBNP in the last 8760 hours. HbA1C: Recent Labs    04/26/21 0728  HGBA1C 5.7*   CBG: Recent Labs  Lab 04/26/21 1201  GLUCAP 91   Lipid Profile: No results for input(s): CHOL, HDL, LDLCALC, TRIG, CHOLHDL, LDLDIRECT in the last 72  hours. Thyroid Function Tests: No results for input(s): TSH, T4TOTAL, FREET4, T3FREE, THYROIDAB in the last 72 hours. Anemia Panel: No results for input(s): VITAMINB12, FOLATE, FERRITIN, TIBC, IRON, RETICCTPCT in the last 72 hours.    Radiology Studies: I have reviewed all of the imaging during this hospital visit personally     Scheduled Meds:  acetaminophen  650 mg Oral Q6H   enoxaparin (LOVENOX) injection  40 mg Subcutaneous Q24H   gabapentin  300 mg Oral TID   insulin aspart  0-15 Units Subcutaneous Q4H   metoCLOPramide (REGLAN) injection  10 mg Intravenous Q6H   mometasone-formoterol  2 puff Inhalation BID   pantoprazole (PROTONIX) IV  40 mg Intravenous Q24H   sodium chloride flush  3 mL Intravenous Q12H   umeclidinium bromide  1 puff Inhalation Daily   Continuous Infusions:  sodium chloride 75 mL/hr at 04/26/21 0607   sodium chloride     methocarbamol (ROBAXIN) IV Stopped (04/25/21 1827)     LOS: 2 days        Kim Lauver Gerome Apley, MD

## 2021-04-26 NOTE — Progress Notes (Signed)
   04/26/21 1500  Oxygen Therapy  SpO2 97 %  O2 Device Nasal Cannula  O2 Flow Rate (L/min) 2 L/min  Patient Activity (if Appropriate) Ambulating  Mobility  Activity Ambulated in hall  Level of Assistance Standby assist, set-up cues, supervision of patient - no hands on  Assistive Device Other (Comment) (IV pole)  Distance Ambulated (ft) 400 ft  Mobility Ambulated with assistance in hallway  Mobility Response Tolerated well  Mobility performed by Mobility specialist  $Mobility charge 1 Mobility   Pt agreeable to mobilizing today. Asked RN to disconnect NG tube before proceeding. Pt ambulated about 48ft while pushing IV pole in hall, tolerated well. Pt on 2L [place]. Was insistent on not having O2 on while ambulating, but ensured pt it was for his safety. On return, pt was coughing and stated he has pain in the abdomen when this occurs. NO other complaints noted. Left pt in bed with call bell at side. Notified RN of return so NG tube could be reconnected.    [place] Specialist Acute Rehab Services Office: [phone]

## 2021-04-26 NOTE — Progress Notes (Signed)
Patient is currently Green MEWS, Yellow MEWS protocol completed. Continue to be NPO  with ice chips only per order. All PO meds held. NGT to suction with greenish fluid. Both drains are intact. Complains of pain multiple times and PRN pain med given. We continue to monitor.

## 2021-04-27 LAB — BASIC METABOLIC PANEL
Anion gap: 9 (ref 5–15)
BUN: 23 mg/dL (ref 8–23)
CO2: 28 mmol/L (ref 22–32)
Calcium: 8.7 mg/dL — ABNORMAL LOW (ref 8.9–10.3)
Chloride: 110 mmol/L (ref 98–111)
Creatinine, Ser: 1.16 mg/dL (ref 0.61–1.24)
GFR, Estimated: >60 mL/min (ref >60)
Glucose, Bld: 101 mg/dL — ABNORMAL HIGH (ref 70–99)
Potassium: 3.7 mmol/L (ref 3.5–5.1)
Sodium: 147 mmol/L — ABNORMAL HIGH (ref 135–145)

## 2021-04-27 LAB — GLUCOSE, CAPILLARY
Glucose-Capillary: 76 mg/dL (ref 70–99)
Glucose-Capillary: 78 mg/dL (ref 70–99)
Glucose-Capillary: 84 mg/dL (ref 70–99)
Glucose-Capillary: 84 mg/dL (ref 70–99)
Glucose-Capillary: 96 mg/dL (ref 70–99)
Glucose-Capillary: 97 mg/dL (ref 70–99)

## 2021-04-27 LAB — MAGNESIUM: Magnesium: 2.3 mg/dL (ref 1.7–2.4)

## 2021-04-27 NOTE — Progress Notes (Signed)
PROGRESS NOTE    Steve Gilbert  RSW:[id number] DOB: [birth year] DOA: 04/24/2021 PCP: Kirk Ruths, MD    Brief Narrative:  Steve Gilbert was admitted to the hospital with the working diagnosis of acute small bowel obstruction related to parastomal hernia, complicated with bilateral hydronephrosis and hydroureter. Now sp parastomal hernia repair.    70 year old male with past medical history of bladder cancer s/p resection and ileal conduit who presents with 3 days of worsening abdominal pain.  Positive nausea and occasional vomiting.  In [facility] he was diagnosed with high-grade small bowel obstruction with transition point at the site of the right mid abdominal parastomal hernia containing a knuckle of mid to distal jejunum. His vital signs are stable, he has an NG tube in place, his lungs are clear to auscultation bilaterally, heart S1-S2, present, rhythmic, his abdomen is distended, positive bowel sounds, nontender to superficial palpation, no lower extremity edema.   There is no blood work available yet.   CT of the abdomen pelvis size small bowel obstruction described above. Also has mild bilateral hydronephrosis and hydroureter which may result from mass-effect on the urinary diversion in the right mid abdomen related to parastomal hernia and small bowel obstruction.   Patient was evaluated by surgery and underwent hernia repair on 04/24/21.  Developed post operative ileus. continue to follow wake up of his intestinal function.   Assessment & Plan:   Principal Problem:   SBO (small bowel obstruction) (HCC) Active Problems:   COPD, mild (HCC)   Essential hypertension   GERD without esophagitis   Hyperlipidemia   Bladder cancer (HCC)   CKD (chronic kidney disease), stage III (HCC)     Acute small bowel obstruction related to parastomal hernia complicated with bilateral hydronephrosis and hydroureter. -Sp hernia repair 04/24/21.  -No nausea or  vomiting, continue to have NG tube in place to low intermittent suction.  -some flatus reported and no bowel movement.  -Plan to continue pain control with hydromorphone 1 mg every 3 hours as needed,  -continue as needed antiemetics  -Continue pantoprazole.  -Continue NPO per surgery recommendations. -Out of bed and ambulate as tolerated.    2.  Hypertension.  (at home on amlodipine and metoprolol). -systolic blood pressure 093 to 170 mmHg -Continue as needed hydralazine for systolic blood pressure above 190. -Resume home oral antihypertensive agents when able to tolerate p.o.'s. -Discussed with patient importance of heart healthy diet.  3.  COPD.   -No clinical signs of acute exacerbation. -Continue with tiotropium and inhaled corticosteroids/LABA. -No wheezing on exam; no using accessory muscle.  Good saturation on room air.   4.  Bladder cancer status post cystectomy.  Old records personally reviewed, transitional cell bladder cancer neoadjuvant chemotherapy, 4 cycles.  Currently under surveillance every 6 months.   Follow up as outpatient with urology    5.  Dyslipidemia.   -Continue with atorvastatin when patient able to tolerate p.o.   6.  AKI on Chronic kidney disease stage IIIa (base cr 1,3 to 1,4) -Patient continue to be NPO, high output on NG tube and high risk for dehydration and prerenal azotemia. -Continue to maintain IV fluids -Renal function has improved and is back to baseline currently. -Continue to avoid hypotension, contrast nephrotoxic agents.   7. Reactive hyperglycemia.  -Continue to follow CBGs -No prior history of diabetes. -A1c 5.7  8.  Class I obesity -Body mass index is 32.39 kg/m. -Low calorie diet, portion control and increase physical activity discussed  with patient.  Patient continue to be at high risk for worsening ileus.   Status is: Inpatient  Remains inpatient appropriate because:Inpatient level of care appropriate due to severity of  illness  Dispo: The patient is from: Home              Anticipated d/c is to: Home              Patient currently is not medically stable to d/c.   Difficult to place patient No   DVT prophylaxis: Enoxaparin   Code Status:    full  Family Communication:   No family at the bedside     Consultants:  Surgery  Urology has been notified.   Procedures:  Hernia repair 04/24/21     Subjective: Patient reports no nausea, no vomiting, NG tube is still in place.  Expressed passing some gas but no bowel movements.  Objective: Vitals:   04/26/21 2133 04/27/21 0149 04/27/21 0534 04/27/21 1308  BP: (!) 161/92 (!) 169/103 (!) 169/106 (!) 153/97  Pulse: (!) 109 (!) 110 (!) 108 (!) 102  Resp: 18 18 18 18   Temp: (!) 97.4 F (36.3 C) (!) 97.4 F (36.3 C) 98.2 F (36.8 C) 98.7 F (37.1 C)  TempSrc: Oral Oral Oral Oral  SpO2: (!) 89% 90% 93% 95%  Weight:      Height:        Intake/Output Summary (Last 24 hours) at 04/27/2021 1806 Last data filed at 04/27/2021 1443 Gross per 24 hour  Intake 1545.79 ml  Output 2970 ml  Net -1424.21 ml   Filed Weights   04/25/21 0333  Weight: 93.8 kg    Examination:  General exam: Alert, awake, oriented x 3, NG tube in place.  No chest pain, no nausea or vomiting currently.  Reports passing some flatus, but no bowel movements. Respiratory system: Clear to auscultation. Respiratory effort normal.  Good saturation on room air. Cardiovascular system:RRR. No murmurs, rubs, gallops.  No JVD. Gastrointestinal system: Abdomen is obese, mildly distended, soft and appropriately tender to palpation.  Binder in place.  Decreased but present bowel sounds appreciated. Central nervous system: Alert and oriented. No focal neurological deficits. Extremities: No cyanosis, clubbing or edema. Skin: No petechiae. Psychiatry: Judgement and insight appear normal. Mood & affect appropriate.    Data Reviewed: I have personally reviewed following labs and imaging  studies  CBC: Recent Labs  Lab 04/24/21 1818 04/25/21 0447 04/26/21 0428  WBC 5.0 5.5 4.9  HGB 15.3 13.5 12.9*  HCT 47.0 41.5 40.4  MCV 93.1 94.7 96.4  PLT 378 338 379   Basic Metabolic Panel: Recent Labs  Lab 04/24/21 1818 04/25/21 0447 04/26/21 0428 04/27/21 0401  NA 142 142 143 147*  K 4.1 4.4 3.9 3.7  CL 102 105 108 110  CO2 28 29 30 28   GLUCOSE 113* 240* 122* 101*  BUN 36* 34* 26* 23  CREATININE 1.55* 1.60* 1.47* 1.16  CALCIUM 9.5 8.5* 8.4* 8.7*  MG  --   --  2.4 2.3   GFR: Estimated Creatinine Clearance: 64.7 mL/min (by C-G formula based on SCr of 1.16 mg/dL).   HbA1C: Recent Labs    04/26/21 0728  HGBA1C 5.7*   CBG: Recent Labs  Lab 04/26/21 2332 04/27/21 0342 04/27/21 0744 04/27/21 1151 04/27/21 1548  GLUCAP 93 96 97 84 84   Radiology Studies: I have reviewed all of the imaging during this hospital visit personally  Scheduled Meds:  acetaminophen  650 mg Oral Q6H  enoxaparin (LOVENOX) injection  40 mg Subcutaneous Q24H   insulin aspart  0-15 Units Subcutaneous Q4H   metoCLOPramide (REGLAN) injection  10 mg Intravenous Q6H   mometasone-formoterol  2 puff Inhalation BID   pantoprazole (PROTONIX) IV  40 mg Intravenous Q24H   sodium chloride flush  3 mL Intravenous Q12H   umeclidinium bromide  1 puff Inhalation Daily   Continuous Infusions:  sodium chloride 75 mL/hr at 04/27/21 1354   sodium chloride     methocarbamol (ROBAXIN) IV Stopped (04/25/21 1827)     LOS: 3 days     Barton Dubois, MD

## 2021-04-27 NOTE — Progress Notes (Addendum)
Initial Nutrition Assessment  DOCUMENTATION CODES:   Obesity unspecified  INTERVENTION:   -If diet unable to be advanced soon, recommend TPN initiation.  NUTRITION DIAGNOSIS:   Inadequate oral intake related to acute illness as evidenced by NPO status.  GOAL:   Patient will meet greater than or equal to 90% of their needs  MONITOR:   Diet advancement, Labs, Weight trends, I & O's  REASON FOR ASSESSMENT:   Malnutrition Screening Tool    ASSESSMENT:   70 year old male with past medical history of bladder cancer s/p resection and ileal conduit who presents with 3 days of worsening abdominal pain.  Positive nausea and occasional vomiting.  In [facility] he was diagnosed with high-grade small bowel obstruction with transition point at the site of the right mid abdominal parastomal hernia containing a knuckle of mid to distal jejunum. Admitted to the hospital with the working diagnosis of acute small bowel obstruction related to parastomal hernia, complicated with bilateral hydronephrosis and hydroureter.  10/8: s/p parastomal hernia repair  Patient currently NPO d/t post-op ileus. Still awaiting bowel function. NGT in place set to suction.  Last PO intake was 3 days PTA (10/5). No PO for 5 days now. Recommend TPN initiation if diet unable to be advanced by day 7 of no PO (~10/13).   Per weight records, pt has lost 12 lbs since 2/17 (5% wt loss x 8 months, insignificant for time frame).   Medications: Reglan  Labs reviewed: CBGs: 84-97 Elevated Na   NUTRITION - FOCUSED PHYSICAL EXAM:  Deferred.  Diet Order:   Diet Order             Diet NPO time specified Except for: Ice Chips  Diet effective now                   EDUCATION NEEDS:   No education needs have been identified at this time  Skin:  Skin Assessment: Skin Integrity Issues: Skin Integrity Issues:: Incisions Incisions: 10/8 abdomen  Last BM:  10/4  Height:   Ht Readings  from Last 1 Encounters:  04/25/21 5\' 7"  (1.702 m)    Weight:   Wt Readings from Last 1 Encounters:  04/25/21 93.8 kg    BMI:  Body mass index is 32.39 kg/m.  Estimated Nutritional Needs:   Kcal:  2100-2300  Protein:  85-100g  Fluid:  2.1L/day  Clayton Bibles, MS, RD, LDN Inpatient Clinical Dietitian Contact information available via Amion

## 2021-04-27 NOTE — Progress Notes (Signed)
Mobility Specialist - Cancellation/Refusal Note    04/27/21 1505  Mobility  Activity Refused mobility   Pt refused mobility and stated he walked with family earlier and prefers to rest at this time. Will check back as schedule permits.   [place] Specialist Acute Rehabilitation Services Phone: [phone] 04/27/21, 3:06 PM

## 2021-04-27 NOTE — Progress Notes (Signed)
3 Days Post-Op   Subjective/Chief Complaint: Pt with some flatus No BM Approp abd pain   Objective: Vital signs in last 24 hours: Temp:  [97.4 F (36.3 C)-98.4 F (36.9 C)] 98.2 F (36.8 C) (10/11 0534) Pulse Rate:  [101-110] 108 (10/11 0534) Resp:  [18-20] 18 (10/11 0534) BP: (148-169)/(92-106) 169/106 (10/11 0534) SpO2:  [89 %-97 %] 93 % (10/11 0534) Last BM Date: 04/20/21  Intake/Output from previous day: 10/10 0701 - 10/11 0700 In: 1056.4 [P.O.:120; I.V.:876.4; NG/GT:60] Out: 4110 [Urine:1500; Emesis/NG output:1700; Drains:910] Intake/Output this shift: No intake/output data recorded.  PE: Gen:  Alert, NAD, pleasant Pulm: rate and effort normal Abd: distended but soft, appropriately tender, hypoactive bowel sounds, incision cdi, urostomy healthy with clear yellow urine  Lab Results:  Recent Labs    04/25/21 0447 04/26/21 0428  WBC 5.5 4.9  HGB 13.5 12.9*  HCT 41.5 40.4  PLT 338 355   BMET Recent Labs    04/26/21 0428 04/27/21 0401  NA 143 147*  K 3.9 3.7  CL 108 110  CO2 30 28  GLUCOSE 122* 101*  BUN 26* 23  CREATININE 1.47* 1.16  CALCIUM 8.4* 8.7*   PT/INR No results for input(s): LABPROT, INR in the last 72 hours. ABG No results for input(s): PHART, HCO3 in the last 72 hours.  Invalid input(s): PCO2, PO2  Studies/Results: No results found.  Anti-infectives: Anti-infectives (From admission, onward)    Start     Dose/Rate Route Frequency Ordered Stop   04/24/21 1920  ceFAZolin (ANCEF) 2-4 GM/100ML-% IVPB       Note to Pharmacy: Herbie Drape   : cabinet override      04/24/21 1920 04/25/21 0729       Assessment/Plan: Bowel obstruction secondary to an incarcerated parastomal hernia, incisional hernia   POD#3 S/p Parastomal hernia repair - Sugarbaker technique, Incisional hernia repair, Bilateral posterior rectus sheath myofascial release, Right sided transversus abdominus release, Lysis of adhesions (60 minutes) 10/8 Dr.  Thermon Leyland - Ileus-continue NPO/ NGT to LIWS and await return in bowel function - JP serosanguinous, Plan to remove JP drain on day of discharge - mobilize   ID - ancef periop FEN - IVF, NPO/NGT to LIWS VTE - lovenox Foley - none   HTN COPD HLD CKD Bladder cancer s/p cystectomy]   LOS: 3 days    Ralene Ok 04/27/2021

## 2021-04-28 DIAGNOSIS — R7303 Prediabetes: Secondary | ICD-10-CM

## 2021-04-28 DIAGNOSIS — E1122 Type 2 diabetes mellitus with diabetic chronic kidney disease: Secondary | ICD-10-CM | POA: Insufficient documentation

## 2021-04-28 LAB — CBC WITH DIFFERENTIAL/PLATELET
Abs Immature Granulocytes: 0.12 10*3/uL — ABNORMAL HIGH (ref 0.00–0.07)
Basophils Absolute: 0 10*3/uL (ref 0.0–0.1)
Basophils Relative: 1 %
Eosinophils Absolute: 0.1 10*3/uL (ref 0.0–0.5)
Eosinophils Relative: 2 %
HCT: 44.4 % (ref 39.0–52.0)
Hemoglobin: 14 g/dL (ref 13.0–17.0)
Immature Granulocytes: 2 %
Lymphocytes Relative: 15 %
Lymphs Abs: 0.8 10*3/uL (ref 0.7–4.0)
MCH: 30.8 pg (ref 26.0–34.0)
MCHC: 31.5 g/dL (ref 30.0–36.0)
MCV: 97.8 fL (ref 80.0–100.0)
Monocytes Absolute: 0.6 10*3/uL (ref 0.1–1.0)
Monocytes Relative: 12 %
Neutro Abs: 3.6 10*3/uL (ref 1.7–7.7)
Neutrophils Relative %: 68 %
Platelets: 418 10*3/uL — ABNORMAL HIGH (ref 150–400)
RBC: 4.54 MIL/uL (ref 4.22–5.81)
RDW: 13.7 % (ref 11.5–15.5)
WBC: 5.2 10*3/uL (ref 4.0–10.5)
nRBC: 0 % (ref 0.0–0.2)

## 2021-04-28 LAB — BASIC METABOLIC PANEL
Anion gap: 11 (ref 5–15)
BUN: 25 mg/dL — ABNORMAL HIGH (ref 8–23)
CO2: 23 mmol/L (ref 22–32)
Calcium: 8.9 mg/dL (ref 8.9–10.3)
Chloride: 113 mmol/L — ABNORMAL HIGH (ref 98–111)
Creatinine, Ser: 1.23 mg/dL (ref 0.61–1.24)
GFR, Estimated: >60 mL/min (ref >60)
Glucose, Bld: 108 mg/dL — ABNORMAL HIGH (ref 70–99)
Potassium: 3.9 mmol/L (ref 3.5–5.1)
Sodium: 147 mmol/L — ABNORMAL HIGH (ref 135–145)

## 2021-04-28 LAB — GLUCOSE, CAPILLARY
Glucose-Capillary: 87 mg/dL (ref 70–99)
Glucose-Capillary: 99 mg/dL (ref 70–99)
Glucose-Capillary: 82 mg/dL (ref 70–99)
Glucose-Capillary: 128 mg/dL — ABNORMAL HIGH (ref 70–99)
Glucose-Capillary: 84 mg/dL (ref 70–99)

## 2021-04-28 LAB — PHOSPHORUS: Phosphorus: 3.7 mg/dL (ref 2.5–4.6)

## 2021-04-28 LAB — MAGNESIUM: Magnesium: 2.3 mg/dL (ref 1.7–2.4)

## 2021-04-28 MED ORDER — HYDRALAZINE HCL 20 MG/ML IJ SOLN: 10.0000 mg | INTRAMUSCULAR | Status: DC | PRN

## 2021-04-28 MED ORDER — LABETALOL HCL 5 MG/ML IV SOLN
10.0000 mg | INTRAVENOUS | Status: DC | PRN
  Filled 2021-04-28: qty 4

## 2021-04-28 MED ORDER — LACTATED RINGERS IV SOLN: INTRAVENOUS | Status: DC

## 2021-04-28 NOTE — Assessment & Plan Note (Addendum)
-   Home regimen resumed at discharge 

## 2021-04-28 NOTE — Progress Notes (Signed)
4 Days Post-Op   Subjective/Chief Complaint: Pt doing well this AM Passing flatus   Objective: Vital signs in last 24 hours: Temp:  [97.5 F (36.4 C)-98.7 F (37.1 C)] 97.5 F (36.4 C) (10/12 0443) Pulse Rate:  [97-103] 97 (10/12 0443) Resp:  [18-20] 18 (10/12 0443) BP: (153-159)/(97-100) 159/100 (10/12 0443) SpO2:  [94 %-97 %] 97 % (10/12 0746) Last BM Date: 04/20/21  Intake/Output from previous day: 10/11 0701 - 10/12 0700 In: 2744.8 [P.O.:60; I.V.:2684.8] Out: 2815 [Urine:2350; Emesis/NG output:450; Drains:15] Intake/Output this shift: No intake/output data recorded.  General appearance: alert and cooperative GI: soft, non-tender; bowel sounds normal; no masses,  no organomegaly and inc c/d/i  Lab Results:  Recent Labs    04/26/21 0428 04/28/21 0815  WBC 4.9 5.2  HGB 12.9* 14.0  HCT 40.4 44.4  PLT 355 418*   BMET Recent Labs    04/27/21 0401 04/28/21 0815  NA 147* 147*  K 3.7 3.9  CL 110 113*  CO2 28 23  GLUCOSE 101* 108*  BUN 23 25*  CREATININE 1.16 1.23  CALCIUM 8.7* 8.9   PT/INR No results for input(s): LABPROT, INR in the last 72 hours. ABG No results for input(s): PHART, HCO3 in the last 72 hours.  Invalid input(s): PCO2, PO2  Studies/Results: No results found.  Anti-infectives: Anti-infectives (From admission, onward)    Start     Dose/Rate Route Frequency Ordered Stop   04/24/21 1920  ceFAZolin (ANCEF) 2-4 GM/100ML-% IVPB       Note to Pharmacy: Herbie Drape   : cabinet override      04/24/21 1920 04/25/21 0729       Assessment/Plan: Bowel obstruction secondary to an incarcerated parastomal hernia, incisional hernia   POD#4 S/p Parastomal hernia repair - Sugarbaker technique, Incisional hernia repair, Bilateral posterior rectus sheath myofascial release, Right sided transversus abdominus release, Lysis of adhesions (60 minutes) 10/8 Dr. Thermon Leyland - DC NGT and start fulls - JP serosanguinous, Plan to remove JP drain on day  of discharge - mobilize   ID - ancef periop FEN - IVF, fulls VTE - lovenox Foley - none   HTN COPD HLD CKD Bladder cancer s/p cystectomy]  LOS: 4 days    Ralene Ok 04/28/2021

## 2021-04-28 NOTE — Assessment & Plan Note (Addendum)
-  No clinical signs of acute exacerbation. -Continue withtiotropium and inhaled corticosteroids/LABA.

## 2021-04-28 NOTE — Assessment & Plan Note (Addendum)
-   Home regimen resumed 

## 2021-04-28 NOTE — Hospital Course (Addendum)
Steve Gilbert is a 70 yo male with PMH bladder cancer s/p resection and ileal conduit who presented with 3 days of worsening abdominal pain.  Positive nausea and occasional vomiting.  In [facility] he was diagnosed with high-grade small bowel obstruction with transition point at the site of the right mid abdominal parastomal hernia containing a knuckle of mid to distal jejunum   CT of the abdomen pelvis size small bowel obstruction described above. Also has mild bilateral hydronephrosis and hydroureter which may result from mass-effect on the urinary diversion in the right mid abdomen related to parastomal hernia and small bowel obstruction.   Patient was evaluated by surgery and underwent hernia repair on 04/24/21 followed by post op ileus.  He was managed with bowel rest and NG tube for decompression.  He had steady clinical improvement and NG tube was removed on 04/28/2021.

## 2021-04-28 NOTE — Assessment & Plan Note (Addendum)
-   transitional cell bladder cancer neoadjuvant chemotherapy, 4 cycles. - Currently under surveillance every 6 months. - Follow up as outpatient with urology

## 2021-04-28 NOTE — Progress Notes (Signed)
Progress Note    [name]   UYQ:[id number]  DOB: 02/16/1951  DOA: 04/24/2021     4 Date of Service: 04/28/2021   [name] is a 70 yo male with PMH bladder cancer s/p resection and ileal conduit who presented with 3 days of worsening abdominal pain.  Positive nausea and occasional vomiting.  In [facility] he was diagnosed with high-grade small bowel obstruction with transition point at the site of the right mid abdominal parastomal hernia containing a knuckle of mid to distal jejunum   CT of the abdomen pelvis size small bowel obstruction described above. Also has mild bilateral hydronephrosis and hydroureter which may result from mass-effect on the urinary diversion in the right mid abdomen related to parastomal hernia and small bowel obstruction.   Patient was evaluated by surgery and underwent hernia repair on 04/24/21 followed by post op ileus.  He was managed with bowel rest and NG tube for decompression.  He had steady clinical improvement and NG tube was removed on 04/28/2021.   Subjective:  No events overnight.  Resting in bed comfortably with a friend at bedside.  Minimal nausea, no vomiting.  NG tube removed already when seen this morning.  He is interested in beginning a diet.  Hospital Problems * SBO (small bowel obstruction) ([place]) - s/p hernia repair 04/24/21; developed post-op ileus  - flatus returning and improved bowel sounds - surgery following, appreciate assistance - NGT removed on 10/12 and trial and FLD - continue ambulating as able   Bladder cancer ([place]) - transitional cell bladder cancer neoadjuvant chemotherapy, 4 cycles. - Currently under surveillance every 6 months. - Follow up as outpatient with urology   Prediabetes - A1c 5.7 - continue diet control   Chronic kidney disease, stage 3a ([place]) - patient has history of CKD3a. Baseline creat ~ 1.3 - 1.4, eGFR 55   Hyperlipidemia - will resume Lipitor after diet  resumed  GERD without esophagitis - continue IV PPI for now  Essential hypertension - continue PRN meds - will resume oral after sufficient PO challenge  COPD, mild (HCC) -No clinical signs of acute exacerbation. -Continue with tiotropium and inhaled corticosteroids/LABA.   Objective Vital signs were reviewed and unremarkable.  Vitals:   04/27/21 2123 04/28/21 0443 04/28/21 0746 04/28/21 1324  BP: (!) 156/99 (!) 159/100  (!) 164/116  Pulse: (!) 103 97  (!) 107  Resp: '20 18  18  ' Temp: 97.6 F (36.4 C) (!) 97.5 F (36.4 C)  97.7 F (36.5 C)  TempSrc: Oral Oral  Oral  SpO2: 94% 96% 97% 100%  Weight:      Height:       93.8 kg  Exam Physical Exam Constitutional:      General: He is not in acute distress.    Appearance: Normal appearance.  HENT:     Head: Normocephalic and atraumatic.     Mouth/Throat:     Mouth: Mucous membranes are moist.  Eyes:     Extraocular Movements: Extraocular movements intact.  Cardiovascular:     Rate and Rhythm: Normal rate and regular rhythm.     Heart sounds: Normal heart sounds.  Pulmonary:     Effort: Pulmonary effort is normal.     Breath sounds: Normal breath sounds. No wheezing.  Abdominal:     General: Bowel sounds are normal. There is distension.     Palpations: Abdomen is soft.     Tenderness: There is abdominal tenderness. There is no guarding  or rebound.  Musculoskeletal:        General: Normal range of motion.     Cervical back: Normal range of motion and neck supple.  Skin:    General: Skin is warm and dry.  Neurological:     General: No focal deficit present.     Mental Status: He is alert.  Psychiatric:        Mood and Affect: Mood normal.     Labs / Other Information My review of labs, imaging, notes and other tests shows no new significant findings.    Time spent: Greater than 50% of the 35 minute visit was spent in counseling/coordination of care for the patient as laid out in the A&P.  [name],  MD [facility] 04/28/2021, 1:31 PM

## 2021-04-28 NOTE — Assessment & Plan Note (Addendum)
-   A1c 5.7% - continue diet control  

## 2021-04-28 NOTE — Assessment & Plan Note (Addendum)
-   s/p hernia repair 04/24/21; developed post-op ileus  - flatus returning and improved bowel sounds - surgery following, appreciate assistance - NGT removed on 10/12 and trial FLD; tolerated well and advanced to soft diet which he also tolerated prior to discharge

## 2021-04-28 NOTE — Assessment & Plan Note (Addendum)
-   Lipitor resumed

## 2021-04-28 NOTE — Assessment & Plan Note (Addendum)
-  patient has history of CKD3a. Baseline creat ~ 1.3 - 1.4, eGFR 55

## 2021-04-28 NOTE — Care Management Important Message (Signed)
Important Message  Patient Details IM Letter given to the Patient. Name: Steve Gilbert MRN: [id number] Date of Birth: [birth year]   Medicare Important Message Given:  Yes     Kerin Salen 04/28/2021, 1:23 PM

## 2021-04-28 NOTE — Progress Notes (Signed)
   04/28/21 1200  Mobility  Activity Refused mobility   Pt refused mobility today. Pt stated he has been ambulating in the halls independently since yesterday.    [place] [facility] Office: [phone]

## 2021-04-29 LAB — CBC WITH DIFFERENTIAL/PLATELET
Abs Immature Granulocytes: 0.19 10*3/uL — ABNORMAL HIGH (ref 0.00–0.07)
Basophils Absolute: 0 10*3/uL (ref 0.0–0.1)
Basophils Relative: 1 %
Eosinophils Absolute: 0.2 10*3/uL (ref 0.0–0.5)
Eosinophils Relative: 4 %
HCT: 38 % — ABNORMAL LOW (ref 39.0–52.0)
Hemoglobin: 12.2 g/dL — ABNORMAL LOW (ref 13.0–17.0)
Immature Granulocytes: 3 %
Lymphocytes Relative: 18 %
Lymphs Abs: 1 10*3/uL (ref 0.7–4.0)
MCH: 30.8 pg (ref 26.0–34.0)
MCHC: 32.1 g/dL (ref 30.0–36.0)
MCV: 96 fL (ref 80.0–100.0)
Monocytes Absolute: 0.7 10*3/uL (ref 0.1–1.0)
Monocytes Relative: 12 %
Neutro Abs: 3.4 10*3/uL (ref 1.7–7.7)
Neutrophils Relative %: 62 %
Platelets: 365 10*3/uL (ref 150–400)
RBC: 3.96 MIL/uL — ABNORMAL LOW (ref 4.22–5.81)
RDW: 13.5 % (ref 11.5–15.5)
WBC: 5.6 10*3/uL (ref 4.0–10.5)
nRBC: 0 % (ref 0.0–0.2)

## 2021-04-29 LAB — GLUCOSE, CAPILLARY
Glucose-Capillary: 108 mg/dL — ABNORMAL HIGH (ref 70–99)
Glucose-Capillary: 94 mg/dL (ref 70–99)
Glucose-Capillary: 97 mg/dL (ref 70–99)
Glucose-Capillary: 98 mg/dL (ref 70–99)

## 2021-04-29 LAB — BASIC METABOLIC PANEL
Anion gap: 9 (ref 5–15)
BUN: 22 mg/dL (ref 8–23)
CO2: 27 mmol/L (ref 22–32)
Calcium: 9 mg/dL (ref 8.9–10.3)
Chloride: 114 mmol/L — ABNORMAL HIGH (ref 98–111)
Creatinine, Ser: 0.9 mg/dL (ref 0.61–1.24)
GFR, Estimated: >60 mL/min (ref >60)
Glucose, Bld: 109 mg/dL — ABNORMAL HIGH (ref 70–99)
Potassium: 3.1 mmol/L — ABNORMAL LOW (ref 3.5–5.1)
Sodium: 150 mmol/L — ABNORMAL HIGH (ref 135–145)

## 2021-04-29 LAB — MAGNESIUM: Magnesium: 1.8 mg/dL (ref 1.7–2.4)

## 2021-04-29 MED ORDER — POTASSIUM CHLORIDE CRYS ER 20 MEQ PO TBCR
40.0000 meq | EXTENDED_RELEASE_TABLET | ORAL | Status: AC
  Administered 2021-04-29 (×2): 40 meq via ORAL
  Filled 2021-04-29 (×2): qty 2

## 2021-04-29 MED ORDER — METHOCARBAMOL 500 MG PO TABS: 500.0000 mg | ORAL_TABLET | Freq: Four times a day (QID) | ORAL | 1 refills | Status: DC | PRN

## 2021-04-29 MED ORDER — MAGNESIUM SULFATE 2 GM/50ML IV SOLN
2.0000 g | Freq: Once | INTRAVENOUS | Status: DC
  Administered 2021-04-29: 2 g via INTRAVENOUS
  Filled 2021-04-29: qty 50

## 2021-04-29 MED ORDER — TRAMADOL HCL 50 MG PO TABS: 50.0000 mg | ORAL_TABLET | Freq: Four times a day (QID) | ORAL | 0 refills | Status: AC | PRN

## 2021-04-29 MED ORDER — ACETAMINOPHEN 325 MG PO TABS: 650.0000 mg | ORAL_TABLET | Freq: Four times a day (QID) | ORAL | Status: DC

## 2021-04-29 MED ORDER — POTASSIUM CHLORIDE 10 MEQ/100ML IV SOLN
10.0000 meq | INTRAVENOUS | Status: DC
  Filled 2021-04-29: qty 100

## 2021-04-29 MED ORDER — MAGNESIUM OXIDE -MG SUPPLEMENT 400 (240 MG) MG PO TABS
800.0000 mg | ORAL_TABLET | Freq: Once | ORAL | Status: AC
  Administered 2021-04-29: 800 mg via ORAL
  Filled 2021-04-29: qty 2

## 2021-04-29 MED ORDER — PANTOPRAZOLE SODIUM 40 MG PO TBEC
40.0000 mg | DELAYED_RELEASE_TABLET | Freq: Every day | ORAL | Status: DC
  Administered 2021-04-29: 40 mg via ORAL
  Filled 2021-04-29: qty 1

## 2021-04-29 NOTE — Discharge Summary (Signed)
Physician Discharge Summary   Patient name: Steve Gilbert  Admit date:     04/24/2021  Discharge date: 04/29/2021  Discharge Physician: [name]   PCP: Kirk Ruths, MD   Recommendations at discharge:  Follow-up with surgery  Discharge Diagnoses Principal Problem:   SBO (small bowel obstruction) (HCC) Active Problems:   Bladder cancer (HCC)   COPD, mild (HCC)   Essential hypertension   GERD without esophagitis   Hyperlipidemia   Chronic kidney disease, stage 3a ([place])   Prediabetes   Resolved Diagnoses Resolved Problems:   * No resolved hospital problems. [facility] Course   Steve Gilbert is a 70 yo male with PMH bladder cancer s/p resection and ileal conduit who presented with 3 days of worsening abdominal pain.  Positive nausea and occasional vomiting.  In [facility] he was diagnosed with high-grade small bowel obstruction with transition point at the site of the right mid abdominal parastomal hernia containing a knuckle of mid to distal jejunum   CT of the abdomen pelvis size small bowel obstruction described above. Also has mild bilateral hydronephrosis and hydroureter which may result from mass-effect on the urinary diversion in the right mid abdomen related to parastomal hernia and small bowel obstruction.   Patient was evaluated by surgery and underwent hernia repair on 04/24/21 followed by post op ileus.  He was managed with bowel rest and NG tube for decompression.  He had steady clinical improvement and NG tube was removed on 04/28/2021. He tolerated diet advancement with no further nausea, vomiting, abdominal pain.  Bowel movements had returned prior to discharge as well.    * SBO (small bowel obstruction) (HCC) - s/p hernia repair 04/24/21; developed post-op ileus  - flatus returning and improved bowel sounds - surgery following, appreciate assistance - NGT removed on 10/12 and trial FLD; tolerated well and advanced to soft  diet which he also tolerated prior to discharge  Bladder cancer ([place]) - transitional cell bladder cancer neoadjuvant chemotherapy, 4 cycles. - Currently under surveillance every 6 months. - Follow up as outpatient with urology   Prediabetes - A1c 5.7 - continue diet control   Chronic kidney disease, stage 3a ([place]) - patient has history of CKD3a. Baseline creat ~ 1.3 - 1.4, eGFR 55   Hyperlipidemia - Lipitor resumed  GERD without esophagitis - Home regimen resumed  Essential hypertension - Home regimen resumed at discharge  COPD, mild (HCC) -No clinical signs of acute exacerbation. -Continue with tiotropium and inhaled corticosteroids/LABA.      Procedures performed: Ex lap with parastomal hernia repair, 04/24/2021  Condition at discharge: stable  Exam Physical Exam Constitutional:      Appearance: Normal appearance. He is not ill-appearing.  HENT:     Head: Normocephalic and atraumatic.     Mouth/Throat:     Mouth: Mucous membranes are moist.  Eyes:     Extraocular Movements: Extraocular movements intact.  Cardiovascular:     Rate and Rhythm: Normal rate and regular rhythm.     Heart sounds: Normal heart sounds.  Pulmonary:     Effort: Pulmonary effort is normal.     Breath sounds: Normal breath sounds. No wheezing.  Abdominal:     General: Bowel sounds are normal. There is no distension.     Palpations: Abdomen is soft.     Tenderness: There is no abdominal tenderness.     Comments: Abdominal binder in place  Musculoskeletal:  General: Normal range of motion.  Skin:    General: Skin is warm and dry.  Neurological:     General: No focal deficit present.     Mental Status: He is alert.  Psychiatric:        Mood and Affect: Mood normal.        Behavior: Behavior normal.     Disposition: Home  Discharge time: greater than 30 minutes.  Follow-up Information     Stechschulte, Nickola Major, MD. Go on 05/19/2021.   Specialty: Surgery Why: Your  appointment is 05/19/21 at 11:10am Please arrive 30 minutes prior to your appointment to check in and fill out paperwork. Bring photo ID and insurance information. Contact information: [address] [place] [address] [phone]                 Allergies as of 04/29/2021       Reactions   Contrast Media [iodinated Diagnostic Agents] Hives, Itching, Rash   On 07/08/20 pt reports that even with premedication patient breaks out into hives 48 hours post contrast. Radiologist and ordering provider aware and for this contrast patient has been on Prednisone a few days prior and will continue prednisone for 14 days post contrast.         Medication List     STOP taking these medications    amLODipine 10 MG tablet Commonly known as: NORVASC   amoxicillin-clavulanate 875-125 MG tablet Commonly known as: AUGMENTIN       TAKE these medications    acetaminophen 325 MG tablet Commonly known as: TYLENOL Take 2 tablets (650 mg total) by mouth every 6 (six) hours.   aspirin EC 81 MG tablet Take 81 mg by mouth daily.   atorvastatin 20 MG tablet Commonly known as: LIPITOR Take 20 mg by mouth daily.   azelastine 0.1 % nasal spray Commonly known as: ASTELIN Place 1 spray into both nostrils 2 (two) times daily.   fluticasone-salmeterol 250-50 MCG/ACT Aepb Commonly known as: ADVAIR Inhale 1 puff into the lungs 2 (two) times daily.   methocarbamol 500 MG tablet Commonly known as: Robaxin Take 1 tablet (500 mg total) by mouth every 6 (six) hours as needed for muscle spasms.   metoprolol tartrate 25 MG tablet Commonly known as: LOPRESSOR TAKE 1 TABLET(25 MG) BY MOUTH TWICE DAILY What changed: See the new instructions.   multivitamin with minerals tablet Take 1 tablet by mouth daily.   pantoprazole 40 MG tablet Commonly known as: PROTONIX Take 40 mg by mouth every evening.   Spiriva HandiHaler 18 MCG inhalation capsule Generic drug: tiotropium Place  18 mcg into inhaler and inhale daily.   traMADol 50 MG tablet Commonly known as: Ultram Take 1 tablet (50 mg total) by mouth every 6 (six) hours as needed.        CT ABDOMEN PELVIS WO CONTRAST  Result Date: 04/23/2021 CLINICAL DATA:  Abdominal distension, abdominal pain EXAM: CT ABDOMEN AND PELVIS WITHOUT CONTRAST TECHNIQUE: Multidetector CT imaging of the abdomen and pelvis was performed following the standard protocol without IV contrast. Unenhanced CT was performed per clinician order. Lack of IV contrast limits sensitivity and specificity, especially for evaluation of abdominal/pelvic solid viscera. COMPARISON:  07/08/2020, 04/23/2021 FINDINGS: Lower chest: No acute pleural or parenchymal lung disease. Hepatobiliary: Unenhanced imaging of the liver and gallbladder demonstrates no acute findings. Subcentimeter hepatic cysts are again noted unchanged. Pancreas: Unremarkable. No pancreatic ductal dilatation or surrounding inflammatory changes. Spleen: Normal in size without focal  abnormality. Adrenals/Urinary Tract: Indeterminate 1.8 cm hypodensity right kidney with 5 mm associated calcification unchanged since prior study. No other focal parenchymal renal abnormalities on this unenhanced exam. There is mild bilateral hydronephrosis and hydroureter, with no evidence of urinary tract calculi. Postsurgical changes from cystectomy with urinary diversion right lower quadrant. The adrenals are unremarkable. Stomach/Bowel: There is a high-grade small bowel obstruction, with transition in the right lower quadrant at the site of a parastomal hernia, reference image 40/2. The small bowel distension and parastomal hernia result in mass effect on the urinary diversion, which may account for the mild obstructive uropathy described above. There is no bowel wall thickening. Colon is decompressed, with scattered diverticulosis throughout the descending and sigmoid colon. No evidence of diverticulitis.  Vascular/Lymphatic: Aortic atherosclerosis. No enlarged abdominal or pelvic lymph nodes. Reproductive: Patient is status post prostatectomy. Other: No free fluid or free gas. Since the previous exam, the right parastomal hernia has developed. There is a persistent midline fat containing supraumbilical ventral hernia, as well as wide diastasis of the rectus musculature at the level the umbilicus unchanged. Musculoskeletal: No acute or destructive bony lesions. Reconstructed images demonstrate no additional findings. IMPRESSION: 1. High-grade small bowel obstruction, with transition point at the site of a right mid abdominal parastomal hernia containing a knuckle of mid to distal jejunum. No evidence of bowel ischemia. 2. Mild bilateral hydronephrosis and hydroureter, which may result from mass effect on the urinary diversion in the right mid abdomen related to the parastomal hernia and small-bowel obstruction described above. 3. Stable right renal cyst or calyceal diverticulum with associated calcification. 4. Postsurgical changes from cysto prostatectomy. 5. Stable midline fat containing ventral hernia and diastasis of the rectus musculature. 6. Distal colonic diverticulosis without diverticulitis. 7.  Choose 1 Electronically Signed   By: Randa Ngo M.D.   On: 04/23/2021 18:09   DG Chest Portable 1 View  Result Date: 04/23/2021 CLINICAL DATA:  Nasogastric tube placement EXAM: PORTABLE CHEST 1 VIEW COMPARISON:  None. FINDINGS: Lungs are clear. No pneumothorax or pleural effusion. Cardiac size within normal limits. Pulmonary vascularity is normal. Nasogastric tube extends into the upper abdomen with its tip overlying the gastric fundus and its proximal side hole roughly 2-3 cm distal to the expected gastroesophageal junction. IMPRESSION: Nasogastric tube tip within the gastric fundus. Advancement of the catheter by 5-10 cm may more optimally position the catheter. Electronically Signed   By: Fidela Salisbury  M.D.   On: 04/23/2021 20:06   Results for orders placed or performed during the hospital encounter of 04/24/21  Surgical PCR screen     Status: None   Collection Time: 04/24/21  7:00 PM   Specimen: Nasal Mucosa; Nasal Swab  Result Value Ref Range Status   MRSA, PCR NEGATIVE NEGATIVE Final   Staphylococcus aureus NEGATIVE NEGATIVE Final    Comment: (NOTE) The Xpert SA Assay (FDA approved for NASAL specimens in patients 74 years of age and older), is one component of a comprehensive surveillance program. It is not intended to diagnose infection nor to guide or monitor treatment. Performed at [facility], [place] [address], [place],  [address]     Signed:  [name] MD.  [facility] 04/29/2021, 3:08 PM

## 2021-04-29 NOTE — Progress Notes (Signed)
5 Days Post-Op   Subjective/Chief Complaint: Tol Fulls + BM mobilizing   Objective: Vital signs in last 24 hours: Temp:  [97.4 F (36.3 C)-98.2 F (36.8 C)] 97.4 F (36.3 C) (10/13 0535) Pulse Rate:  [89-107] 89 (10/13 0535) Resp:  [18] 18 (10/13 0535) BP: (134-164)/(96-116) 153/98 (10/13 0535) SpO2:  [93 %-100 %] 94 % (10/13 0535) Last BM Date: 04/20/21  Intake/Output from previous day: 10/12 0701 - 10/13 0700 In: 1828.7 [P.O.:470; I.V.:1348.7] Out: 2965 [Urine:2950; Drains:15] Intake/Output this shift: No intake/output data recorded.  General appearance: alert and cooperative GI: soft, non-tender; bowel sounds normal; no masses,  no organomegaly and inc c/d/i  Lab Results:  Recent Labs    04/28/21 0815 04/29/21 0410  WBC 5.2 5.6  HGB 14.0 12.2*  HCT 44.4 38.0*  PLT 418* 365   BMET Recent Labs    04/28/21 0815 04/29/21 0410  NA 147* 150*  K 3.9 3.1*  CL 113* 114*  CO2 23 27  GLUCOSE 108* 109*  BUN 25* 22  CREATININE 1.23 0.90  CALCIUM 8.9 9.0   PT/INR No results for input(s): LABPROT, INR in the last 72 hours. ABG No results for input(s): PHART, HCO3 in the last 72 hours.  Invalid input(s): PCO2, PO2  Studies/Results: No results found.  Anti-infectives: Anti-infectives (From admission, onward)    Start     Dose/Rate Route Frequency Ordered Stop   04/24/21 1920  ceFAZolin (ANCEF) 2-4 GM/100ML-% IVPB       Note to Pharmacy: Herbie Drape   : cabinet override      04/24/21 1920 04/25/21 0729       Assessment/Plan: s/p Procedure(s): EXPLORATORY LAPAROTOMY; PARASTOMAL HERNIA REPAIR Sugerbaker technique  incisional hernia repair, right sided TAR (N/A) Advance diet Maybe home after lunch if tol diet Will DC JP prior to DC  LOS: 5 days    Ralene Ok 04/29/2021

## 2021-04-29 NOTE — Hospital Course (Signed)
Steve Gilbert is a 70 yo male with PMH bladder cancer s/p resection and ileal conduit who presented with 3 days of worsening abdominal pain.  Positive nausea and occasional vomiting.  In [facility] he was diagnosed with high-grade small bowel obstruction with transition point at the site of the right mid abdominal parastomal hernia containing a knuckle of mid to distal jejunum   CT of the abdomen pelvis size small bowel obstruction described above. Also has mild bilateral hydronephrosis and hydroureter which may result from mass-effect on the urinary diversion in the right mid abdomen related to parastomal hernia and small bowel obstruction.   Patient was evaluated by surgery and underwent hernia repair on 04/24/21 followed by post op ileus.  He was managed with bowel rest and NG tube for decompression.  He had steady clinical improvement and NG tube was removed on 04/28/2021. He tolerated diet advancement with no further nausea, vomiting, abdominal pain.  Bowel movements had returned prior to discharge as well.

## 2021-05-03 DIAGNOSIS — C775 Secondary and unspecified malignant neoplasm of intrapelvic lymph nodes: Secondary | ICD-10-CM | POA: Diagnosis not present

## 2021-05-03 DIAGNOSIS — R7881 Bacteremia: Secondary | ICD-10-CM | POA: Diagnosis not present

## 2021-05-03 DIAGNOSIS — N130 Hydronephrosis with ureteropelvic junction obstruction: Secondary | ICD-10-CM | POA: Diagnosis not present

## 2021-05-03 DIAGNOSIS — A4901 Methicillin susceptible Staphylococcus aureus infection, unspecified site: Secondary | ICD-10-CM | POA: Diagnosis not present

## 2021-05-03 DIAGNOSIS — C678 Malignant neoplasm of overlapping sites of bladder: Secondary | ICD-10-CM | POA: Diagnosis not present

## 2021-05-03 DIAGNOSIS — Z936 Other artificial openings of urinary tract status: Secondary | ICD-10-CM | POA: Diagnosis not present

## 2021-07-21 DIAGNOSIS — C678 Malignant neoplasm of overlapping sites of bladder: Secondary | ICD-10-CM | POA: Diagnosis not present

## 2021-07-21 DIAGNOSIS — C775 Secondary and unspecified malignant neoplasm of intrapelvic lymph nodes: Secondary | ICD-10-CM | POA: Diagnosis not present

## 2021-07-21 DIAGNOSIS — A4901 Methicillin susceptible Staphylococcus aureus infection, unspecified site: Secondary | ICD-10-CM | POA: Diagnosis not present

## 2021-07-21 DIAGNOSIS — N130 Hydronephrosis with ureteropelvic junction obstruction: Secondary | ICD-10-CM | POA: Diagnosis not present

## 2021-07-21 DIAGNOSIS — Z936 Other artificial openings of urinary tract status: Secondary | ICD-10-CM | POA: Diagnosis not present

## 2021-07-21 DIAGNOSIS — R7881 Bacteremia: Secondary | ICD-10-CM | POA: Diagnosis not present

## 2021-08-19 ENCOUNTER — Other Ambulatory Visit: Payer: Self-pay

## 2021-08-19 ENCOUNTER — Ambulatory Visit
Admission: RE | Admit: 2021-08-19 | Discharge: 2021-08-19 | Disposition: A | Discharge status: Home / Self Care | Payer: PPO | Source: Ambulatory Visit | Attending: Internal Medicine | Admitting: Internal Medicine

## 2021-08-19 DIAGNOSIS — K5730 Diverticulosis of large intestine without perforation or abscess without bleeding: Secondary | ICD-10-CM | POA: Diagnosis not present

## 2021-08-19 DIAGNOSIS — C675 Malignant neoplasm of bladder neck: Secondary | ICD-10-CM | POA: Insufficient documentation

## 2021-08-19 DIAGNOSIS — K7689 Other specified diseases of liver: Secondary | ICD-10-CM | POA: Diagnosis not present

## 2021-08-23 DIAGNOSIS — Z933 Colostomy status: Secondary | ICD-10-CM | POA: Diagnosis not present

## 2021-08-23 DIAGNOSIS — J449 Chronic obstructive pulmonary disease, unspecified: Secondary | ICD-10-CM | POA: Diagnosis not present

## 2021-08-23 DIAGNOSIS — J441 Chronic obstructive pulmonary disease with (acute) exacerbation: Secondary | ICD-10-CM | POA: Diagnosis not present

## 2021-08-23 DIAGNOSIS — N1831 Chronic kidney disease, stage 3a: Secondary | ICD-10-CM | POA: Diagnosis not present

## 2021-08-29 ENCOUNTER — Other Ambulatory Visit: Payer: Self-pay | Admitting: Internal Medicine

## 2021-08-31 ENCOUNTER — Encounter: Payer: Self-pay | Admitting: Internal Medicine

## 2021-09-06 ENCOUNTER — Other Ambulatory Visit: Payer: Self-pay | Admitting: Internal Medicine

## 2021-09-07 DIAGNOSIS — J449 Chronic obstructive pulmonary disease, unspecified: Secondary | ICD-10-CM | POA: Diagnosis not present

## 2021-09-07 DIAGNOSIS — E7800 Pure hypercholesterolemia, unspecified: Secondary | ICD-10-CM | POA: Diagnosis not present

## 2021-09-07 DIAGNOSIS — I10 Essential (primary) hypertension: Secondary | ICD-10-CM | POA: Diagnosis not present

## 2021-09-07 DIAGNOSIS — R7303 Prediabetes: Secondary | ICD-10-CM | POA: Diagnosis not present

## 2021-09-09 ENCOUNTER — Telehealth: Payer: Self-pay | Admitting: Internal Medicine

## 2021-09-09 DIAGNOSIS — C675 Malignant neoplasm of bladder neck: Secondary | ICD-10-CM

## 2021-09-09 NOTE — Telephone Encounter (Signed)
I spoke to patient regarding results of the CT scan negative for any recurrence.  We will discuss further at next visit.  C-please schedule follow-up in 2 to 3 weeks- MD; labs CBC CMP. Thanks GB

## 2021-09-10 NOTE — Telephone Encounter (Signed)
Lab order entered.

## 2021-09-10 NOTE — Addendum Note (Signed)
Addended by: Vanice Sarah on: 09/10/2021 08:24 AM   Modules accepted: Orders

## 2021-09-14 DIAGNOSIS — R7303 Prediabetes: Secondary | ICD-10-CM | POA: Diagnosis not present

## 2021-09-14 DIAGNOSIS — J449 Chronic obstructive pulmonary disease, unspecified: Secondary | ICD-10-CM | POA: Diagnosis not present

## 2021-09-14 DIAGNOSIS — I10 Essential (primary) hypertension: Secondary | ICD-10-CM | POA: Diagnosis not present

## 2021-09-14 DIAGNOSIS — N1831 Chronic kidney disease, stage 3a: Secondary | ICD-10-CM | POA: Diagnosis not present

## 2021-09-14 DIAGNOSIS — Z125 Encounter for screening for malignant neoplasm of prostate: Secondary | ICD-10-CM | POA: Diagnosis not present

## 2021-09-14 DIAGNOSIS — Z933 Colostomy status: Secondary | ICD-10-CM | POA: Diagnosis not present

## 2021-09-21 DIAGNOSIS — Z936 Other artificial openings of urinary tract status: Secondary | ICD-10-CM | POA: Diagnosis not present

## 2021-09-21 DIAGNOSIS — C775 Secondary and unspecified malignant neoplasm of intrapelvic lymph nodes: Secondary | ICD-10-CM | POA: Diagnosis not present

## 2021-09-21 DIAGNOSIS — N130 Hydronephrosis with ureteropelvic junction obstruction: Secondary | ICD-10-CM | POA: Diagnosis not present

## 2021-09-21 DIAGNOSIS — R7881 Bacteremia: Secondary | ICD-10-CM | POA: Diagnosis not present

## 2021-09-21 DIAGNOSIS — A4901 Methicillin susceptible Staphylococcus aureus infection, unspecified site: Secondary | ICD-10-CM | POA: Diagnosis not present

## 2021-09-21 DIAGNOSIS — C678 Malignant neoplasm of overlapping sites of bladder: Secondary | ICD-10-CM | POA: Diagnosis not present

## 2021-09-24 ENCOUNTER — Encounter: Payer: Self-pay | Admitting: Internal Medicine

## 2021-09-24 ENCOUNTER — Inpatient Hospital Stay: Payer: PPO | Admitting: Internal Medicine

## 2021-09-24 ENCOUNTER — Other Ambulatory Visit: Payer: Self-pay

## 2021-09-24 ENCOUNTER — Inpatient Hospital Stay: Payer: PPO | Attending: Internal Medicine

## 2021-09-24 VITALS — BP 113/88 | HR 62 | Temp 96.8°F | Ht 67.0 in | Wt 217.6 lb

## 2021-09-24 DIAGNOSIS — N182 Chronic kidney disease, stage 2 (mild): Secondary | ICD-10-CM | POA: Insufficient documentation

## 2021-09-24 DIAGNOSIS — Z79899 Other long term (current) drug therapy: Secondary | ICD-10-CM | POA: Diagnosis not present

## 2021-09-24 DIAGNOSIS — C675 Malignant neoplasm of bladder neck: Secondary | ICD-10-CM

## 2021-09-24 DIAGNOSIS — I129 Hypertensive chronic kidney disease with stage 1 through stage 4 chronic kidney disease, or unspecified chronic kidney disease: Secondary | ICD-10-CM | POA: Insufficient documentation

## 2021-09-24 DIAGNOSIS — Z87891 Personal history of nicotine dependence: Secondary | ICD-10-CM | POA: Diagnosis not present

## 2021-09-24 DIAGNOSIS — Z936 Other artificial openings of urinary tract status: Secondary | ICD-10-CM | POA: Insufficient documentation

## 2021-09-24 LAB — COMPREHENSIVE METABOLIC PANEL
ALT: 27 U/L (ref 0–44)
AST: 27 U/L (ref 15–41)
Albumin: 4.1 g/dL (ref 3.5–5.0)
Alkaline Phosphatase: 74 U/L (ref 38–126)
Anion gap: 7 (ref 5–15)
BUN: 17 mg/dL (ref 8–23)
CO2: 27 mmol/L (ref 22–32)
Calcium: 9.1 mg/dL (ref 8.9–10.3)
Chloride: 102 mmol/L (ref 98–111)
Creatinine, Ser: 1.31 mg/dL — ABNORMAL HIGH (ref 0.61–1.24)
GFR, Estimated: 59 mL/min — ABNORMAL LOW (ref >60)
Glucose, Bld: 99 mg/dL (ref 70–99)
Potassium: 4.4 mmol/L (ref 3.5–5.1)
Sodium: 136 mmol/L (ref 135–145)
Total Bilirubin: 1.6 mg/dL — ABNORMAL HIGH (ref 0.3–1.2)
Total Protein: 7.2 g/dL (ref 6.5–8.1)

## 2021-09-24 LAB — CBC WITH DIFFERENTIAL/PLATELET
Abs Immature Granulocytes: 0.02 10*3/uL (ref 0.00–0.07)
Basophils Absolute: 0.1 10*3/uL (ref 0.0–0.1)
Basophils Relative: 1 %
Eosinophils Absolute: 0.1 10*3/uL (ref 0.0–0.5)
Eosinophils Relative: 3 %
HCT: 46.6 % (ref 39.0–52.0)
Hemoglobin: 16.2 g/dL (ref 13.0–17.0)
Immature Granulocytes: 1 %
Lymphocytes Relative: 22 %
Lymphs Abs: 0.9 10*3/uL (ref 0.7–4.0)
MCH: 30.8 pg (ref 26.0–34.0)
MCHC: 34.8 g/dL (ref 30.0–36.0)
MCV: 88.6 fL (ref 80.0–100.0)
Monocytes Absolute: 0.6 10*3/uL (ref 0.1–1.0)
Monocytes Relative: 16 %
Neutro Abs: 2.4 10*3/uL (ref 1.7–7.7)
Neutrophils Relative %: 57 %
Platelets: 221 10*3/uL (ref 150–400)
RBC: 5.26 MIL/uL (ref 4.22–5.81)
RDW: 13 % (ref 11.5–15.5)
WBC: 4.1 10*3/uL (ref 4.0–10.5)
nRBC: 0 % (ref 0.0–0.2)

## 2021-09-24 NOTE — Assessment & Plan Note (Addendum)
#  Transitional cell bladder cancer neo-adjuvant chemo-s/p 4 cycles of dose dense MVAC; status post cystectomy [JAN 2021]. FEB 23rd, 2023-  No unenhanced CT evidence of metastatic disease in the abdomen or pelvis; Three scattered subcentimeter hypodense liver lesions are too small to characterize and are not appreciably changed, presumably Benign; Expected postsurgical changes from cystoprostatectomy with ileal conduit urinary diversion. No hydronephrosis.  Will plan CT scan in 6 months- for next 1 year; and then annual for total of 5 years.  ? ?# Chronic kidney disease-stage II-stable ? ?#Incidental findings on Imaging  CT CAP- Feb, 2023:4. Interval mesh repair of ventral abdominal hernia with no evidence of recurrent ventral abdominal hernia;  Mildly complex 2.0 cm cystic lesion in the peripheral interpolar ?right kidney with internal 4 mm calcification, not appreciably changed from 04/23/2021 CT;  Mild left colonic diverticulosis;  Aortic Atherosclerosis  I reviewed/discussed/counseled the patient.  ? ?# HNT-on metoprolol.  Recommend follow up PCP re: further refills. ? ?# DISPOSITION: ?# follow up in 6 months/labs- cbc/cmp; CT A/P prior--Dr.B ? ?# I reviewed the blood work- with the patient in detail; also reviewed the imaging independently [as summarized above]; and with the patient in detail.  ? ?# 25 minutes face-to-face with the patient discussing the above plan of care; more than 50% of time spent on prognosis/ natural history; counseling and coordination. ? ? ? ? ?

## 2021-09-24 NOTE — Progress Notes (Signed)
[facility] NOTE  Patient Care Team: Kirk Ruths, MD as PCP - General (Internal Medicine) Hollice Espy, MD as Consulting Physician (Urology) Cammie Sickle, MD as Consulting Physician (Internal Medicine)  CHIEF COMPLAINTS/PURPOSE OF CONSULTATION:  Bladder cancer  #  Oncology History Overview Note  # BLADDER CANCER: pT2/cpT4  prostate/bladder neck Dr.Brandon TURP s/p  - INVASIVE UROTHELIAL CARCINOMA, HIGH-GRADE;  TUMOR INVADES BLADDER NECK MUSCULARIS PROPRIA. Mild-mod Right hydronephrosis [facility] RISK FEATURES]   # OCt 9t ddMVAC x4; Jan 24th- 2021 cystectomy [Dr.Manny; [place]] ypTis ypN1; stage III suriveillaince  # Feb 2020- UTI/sepsis- ICU  x3 weeks IV antibiotics/ s-p port removal.   # 2 Echo- 50% to 55%. [oct 2019]  # COPD/ not on O2; [quit 2009];    DIAGNOSIS: BLADDER CA  STAGE: Stage III;GOALS: cure  CURRENT/MOST RECENT THERAPY : Surveillance    Cancer of bladder neck (HCC)    HISTORY OF PRESENTING ILLNESS:  Steve Gilbert 71 y.o.  male with muscle invasive bladder cancer status post neoadjuvant chemotherapy-followed by cystectomy currently on surveillance is here for follow-up/ review results of the CT scan.  Patient denies any new onset of back pain or joint pains.  Shortness of breath or cough no bone pain.  No nausea no vomiting.  No worsening shortness of breath or chest pain.  Review of Systems  Constitutional:  Negative for chills, diaphoresis, fever and weight loss.  HENT:  Negative for nosebleeds and sore throat.   Eyes:  Negative for double vision.  Cardiovascular:  Negative for chest pain, palpitations, orthopnea and leg swelling.  Gastrointestinal:  Negative for abdominal pain, blood in stool, constipation, diarrhea, heartburn, melena, nausea and vomiting.  Genitourinary:  Negative for dysuria, frequency and urgency.  Musculoskeletal:  Positive for back pain and joint pain.  Neurological:  Negative for dizziness,  tingling, focal weakness, weakness and headaches.  Endo/Heme/Allergies:  Does not bruise/bleed easily.  Psychiatric/Behavioral:  Negative for depression. The patient is not nervous/anxious and does not have insomnia.     MEDICAL HISTORY:  Past Medical History:  Diagnosis Date   Acid reflux    Allergy    Cancer of bladder neck ([place]) 02/2018   Chemo tx's and surgical resection.   COPD (chronic obstructive pulmonary disease) (HCC)    High cholesterol    Hyperlipidemia 04/03/2018   Hypertension    Pneumonia     SURGICAL HISTORY: Past Surgical History:  Procedure Laterality Date   CYSTOSCOPY W/ URETERAL STENT REMOVAL Right 08/10/2018   Procedure: CYSTOSCOPY WITH STENT REMOVAL;  Surgeon: Alexis Frock, MD;  Location: [facility];  Service: Urology;  Laterality: Right;   CYSTOSCOPY WITH BIOPSY N/A 04/04/2018   Procedure: CYSTOSCOPY WITH TURBT;  Surgeon: Hollice Espy, MD;  Location: [facility];  Service: Urology;  Laterality: N/A;   CYSTOSCOPY WITH URETEROSCOPY AND STENT PLACEMENT Right 04/04/2018   Procedure: CYSTOSCOPY WITH URETEROSCOPY AND STENT PLACEMENT;  Surgeon: Hollice Espy, MD;  Location: [facility];  Service: Urology;  Laterality: Right;   LAPAROTOMY N/A 04/24/2021   Procedure: EXPLORATORY LAPAROTOMY; PARASTOMAL HERNIA REPAIR Sugerbaker technique  incisional hernia repair, right sided TAR;  Surgeon: Stechschulte, Nickola Major, MD;  Location: [facility];  Service: General;  Laterality: N/A;   NASAL SINUS SURGERY     PORTA CATH INSERTION N/A 04/23/2018   Procedure: PORTA CATH INSERTION;  Surgeon: Algernon Huxley, MD;  Location: [facility];  Service: Cardiovascular;  Laterality: N/A;   PORTA CATH REMOVAL N/A 09/04/2018   Procedure:  PORTA CATH REMOVAL;  Surgeon: Katha Cabal, MD;  Location: [place] [facility];  Service: Cardiovascular;  Laterality: N/A;   TEE WITHOUT CARDIOVERSION N/A 09/05/2018   Procedure: TRANSESOPHAGEAL ECHOCARDIOGRAM (TEE);  Surgeon: Minna Merritts, MD;   Location: [facility];  Service: Cardiovascular;  Laterality: N/A;   TRANSURETHRAL RESECTION OF PROSTATE N/A 04/04/2018   Procedure: TRANSURETHRAL RESECTION OF THE PROSTATE (TURP);  Surgeon: Hollice Espy, MD;  Location: [facility];  Service: Urology;  Laterality: N/A;   URETERAL BIOPSY Right 04/04/2018   Procedure: URETERAL BIOPSY;  Surgeon: Hollice Espy, MD;  Location: [facility];  Service: Urology;  Laterality: Right;     SOCIAL HISTORY:  Social History   Socioeconomic History   Marital status: Widowed    Spouse name: Not on file   Number of children: Not on file   Years of education: Not on file   Highest education level: Not on file  Occupational History   Not on file  Tobacco Use   Smoking status: Former    Packs/day: 1.50    Years: 30.00    Pack years: 45.00    Types: Cigarettes    Quit date: 2009    Years since quitting: 14.1   Smokeless tobacco: Former  Scientific laboratory technician Use: Never used  Substance and Sexual Activity   Alcohol use: Yes    Alcohol/week: 7.0 standard drinks    Types: 5 Cans of beer, 2 Standard drinks or equivalent per week    Comment: ocassional    Drug use: Not Currently   Sexual activity: Not Currently  Other Topics Concern   Not on file  Social History Narrative   currently retired.  Works part-time.  Remote history of smoking.  No alcohol.   Social Determinants of Health   Financial Resource Strain: Not on file  Food Insecurity: Not on file  Transportation Needs: Not on file  Physical Activity: Not on file  Stress: Not on file  Social Connections: Not on file  Intimate Partner Violence: Not on file    FAMILY HISTORY: Family History  Problem Relation Age of Onset   Diabetes Mother    Heart disease Father    Diabetes Brother    Heart attack Brother    Heart disease Brother     ALLERGIES:  is allergic to contrast media [iodinated contrast media].  MEDICATIONS:  Current Outpatient Medications  Medication Sig Dispense Refill    acetaminophen (TYLENOL) 325 MG tablet Take 2 tablets (650 mg total) by mouth every 6 (six) hours.     aspirin EC 81 MG tablet Take 81 mg by mouth daily.     atorvastatin (LIPITOR) 20 MG tablet Take 20 mg by mouth daily.     azelastine (ASTELIN) 0.1 % nasal spray Place 1 spray into both nostrils 2 (two) times daily.     fluticasone-salmeterol (ADVAIR) 250-50 MCG/ACT AEPB Inhale 1 puff into the lungs 2 (two) times daily.     methocarbamol (ROBAXIN) 500 MG tablet Take 1 tablet (500 mg total) by mouth every 6 (six) hours as needed for muscle spasms. 40 tablet 1   metoprolol tartrate (LOPRESSOR) 25 MG tablet TAKE 1 TABLET(25 MG) BY MOUTH TWICE DAILY 180 tablet 1   Multiple Vitamins-Minerals (MULTIVITAMIN WITH MINERALS) tablet Take 1 tablet by mouth daily.     pantoprazole (PROTONIX) 40 MG tablet Take 40 mg by mouth every evening.      tiotropium (SPIRIVA HANDIHALER) 18 MCG inhalation capsule Place 18 mcg into inhaler  and inhale daily.     traMADol (ULTRAM) 50 MG tablet Take 1 tablet (50 mg total) by mouth every 6 (six) hours as needed. 20 tablet 0   No current facility-administered medications for this visit.      Marland Kitchen  PHYSICAL EXAMINATION: ECOG PERFORMANCE STATUS: 1 - Symptomatic but completely ambulatory  Vitals:   09/24/21 1051  BP: 113/88  Pulse: 62  Temp: (!) 96.8 F (36 C)  SpO2: 97%   Filed Weights   09/24/21 1051  Weight: 217 lb 9.6 oz (98.7 kg)    Physical Exam Constitutional:      Comments: He is alone.  Walking himself.  HENT:     Head: Normocephalic and atraumatic.     Mouth/Throat:     Pharynx: No oropharyngeal exudate.  Eyes:     Pupils: Pupils are equal, round, and reactive to light.  Cardiovascular:     Rate and Rhythm: Normal rate and regular rhythm.  Pulmonary:     Effort: No respiratory distress.     Breath sounds: Normal breath sounds.  Abdominal:     General: Bowel sounds are normal. There is no distension.     Palpations: Abdomen is soft. There is no  mass.     Tenderness: There is no abdominal tenderness. There is no guarding or rebound.     Comments: Urostomy.   Musculoskeletal:        General: No tenderness. Normal range of motion.     Cervical back: Normal range of motion and neck supple.  Skin:    General: Skin is warm.     Comments: Mild macular rash bilateral medial thighs and buttocks.  Neurological:     Mental Status: He is alert and oriented to person, place, and time.  Psychiatric:        Mood and Affect: Affect normal.     LABORATORY DATA:  I have reviewed the data as listed Lab Results  Component Value Date   WBC 4.1 09/24/2021   HGB 16.2 09/24/2021   HCT 46.6 09/24/2021   MCV 88.6 09/24/2021   PLT 221 09/24/2021   Recent Labs    03/04/21 1015 04/24/21 1818 04/28/21 0815 04/29/21 0410 09/24/21 1035  NA 140   < > 147* 150* 136  K 4.1   < > 3.9 3.1* 4.4  CL 101   < > 113* 114* 102  CO2 31   < > '23 27 27  '$ GLUCOSE 113*   < > 108* 109* 99  BUN 21   < > 25* 22 17  CREATININE 1.37*   < > 1.23 0.90 1.31*  CALCIUM 9.2   < > 8.9 9.0 9.1  GFRNONAA 55*   < > >60 >60 59*  PROT 7.5  --   --   --  7.2  ALBUMIN 4.1  --   --   --  4.1  AST 29  --   --   --  27  ALT 28  --   --   --  27  ALKPHOS 72  --   --   --  74  BILITOT 0.9  --   --   --  1.6*   < > = values in this interval not displayed.    RADIOGRAPHIC STUDIES: I have personally reviewed the radiological images as listed and agreed with the findings in the report. No results found.  ASSESSMENT & PLAN:   Cancer of bladder neck ([place]) #Transitional cell bladder cancer neo-adjuvant chemo-s/p  4 cycles of dose dense MVAC; status post cystectomy [JAN 2021]. FEB 23rd, 2023-  No unenhanced CT evidence of metastatic disease in the abdomen or pelvis; Three scattered subcentimeter hypodense liver lesions are too small to characterize and are not appreciably changed, presumably Benign; Expected postsurgical changes from cystoprostatectomy with ileal conduit urinary  diversion. No hydronephrosis.  Will plan CT scan in 6 months- for next 1 year; and then annual for total of 5 years.   # Chronic kidney disease-stage II-stable  #Incidental findings on Imaging  CT CAP- Feb, 2023:4. Interval mesh repair of ventral abdominal hernia with no evidence of recurrent ventral abdominal hernia;  Mildly complex 2.0 cm cystic lesion in the peripheral interpolar right kidney with internal 4 mm calcification, not appreciably changed from 04/23/2021 CT;  Mild left colonic diverticulosis;  Aortic Atherosclerosis  I reviewed/discussed/counseled the patient.   # HNT-on metoprolol.  Recommend follow up PCP re: further refills.  # DISPOSITION: # follow up in 6 months/labs- cbc/cmp; CT A/P prior--Dr.B  # I reviewed the blood work- with the patient in detail; also reviewed the imaging independently [as summarized above]; and with the patient in detail.   # 25 minutes face-to-face with the patient discussing the above plan of care; more than 50% of time spent on prognosis/ natural history; counseling and coordination.      All questions were answered. The patient knows to call the clinic with any problems, questions or concerns.     Cammie Sickle, MD 09/24/2021 12:45 PM

## 2021-11-17 DIAGNOSIS — C775 Secondary and unspecified malignant neoplasm of intrapelvic lymph nodes: Secondary | ICD-10-CM | POA: Diagnosis not present

## 2021-11-17 DIAGNOSIS — C678 Malignant neoplasm of overlapping sites of bladder: Secondary | ICD-10-CM | POA: Diagnosis not present

## 2021-11-17 DIAGNOSIS — R7881 Bacteremia: Secondary | ICD-10-CM | POA: Diagnosis not present

## 2021-11-17 DIAGNOSIS — Z936 Other artificial openings of urinary tract status: Secondary | ICD-10-CM | POA: Diagnosis not present

## 2021-11-17 DIAGNOSIS — A4901 Methicillin susceptible Staphylococcus aureus infection, unspecified site: Secondary | ICD-10-CM | POA: Diagnosis not present

## 2021-11-17 DIAGNOSIS — N130 Hydronephrosis with ureteropelvic junction obstruction: Secondary | ICD-10-CM | POA: Diagnosis not present

## 2021-12-31 ENCOUNTER — Ambulatory Visit
Admission: RE | Admit: 2021-12-31 | Discharge: 2021-12-31 | Disposition: A | Discharge status: Home / Self Care | Payer: PPO | Source: Ambulatory Visit | Attending: Family Medicine | Admitting: Family Medicine

## 2021-12-31 ENCOUNTER — Other Ambulatory Visit: Payer: Self-pay | Admitting: Family Medicine

## 2021-12-31 ENCOUNTER — Telehealth: Payer: Self-pay | Admitting: *Deleted

## 2021-12-31 DIAGNOSIS — N5089 Other specified disorders of the male genital organs: Secondary | ICD-10-CM | POA: Diagnosis not present

## 2021-12-31 DIAGNOSIS — Z8551 Personal history of malignant neoplasm of bladder: Secondary | ICD-10-CM | POA: Diagnosis not present

## 2021-12-31 DIAGNOSIS — N433 Hydrocele, unspecified: Secondary | ICD-10-CM | POA: Diagnosis not present

## 2021-12-31 DIAGNOSIS — I861 Scrotal varices: Secondary | ICD-10-CM | POA: Diagnosis not present

## 2021-12-31 NOTE — Telephone Encounter (Signed)
Call from [facility], pt had [name] and would like follow-up for Hydrocele. Pt scheduled for Tuesday at [place] with Dr. Diamantina Providence on first available.

## 2022-01-04 ENCOUNTER — Ambulatory Visit: Payer: PPO | Admitting: Urology

## 2022-01-04 ENCOUNTER — Encounter: Payer: Self-pay | Admitting: Urology

## 2022-01-04 VITALS — BP 149/92 | HR 70 | Ht 67.0 in | Wt 225.0 lb

## 2022-01-04 DIAGNOSIS — Z8551 Personal history of malignant neoplasm of bladder: Secondary | ICD-10-CM | POA: Diagnosis not present

## 2022-01-04 DIAGNOSIS — N432 Other hydrocele: Secondary | ICD-10-CM | POA: Diagnosis not present

## 2022-01-04 DIAGNOSIS — C679 Malignant neoplasm of bladder, unspecified: Secondary | ICD-10-CM

## 2022-01-04 NOTE — Patient Instructions (Signed)
Hydrocele, Adult A hydrocele is a collection of fluid in the loose pouch of skin that holds the testicles (scrotum). It can occur in one or both testicles. This may happen because: The amount of fluid produced in the scrotum is not absorbed by the rest of the body. Fluid from the abdomen fills the scrotum. Normally, the testicles develop in the abdomen and then drop into the scrotum before birth. The tube that the testicles travel through usually closes after the testicles drop. If the tube does not close, fluid from the abdomen can fill the scrotum. This is not very common in adults. What are the causes? A hydrocele may be caused by: An injury to the scrotum. An infection. Decreased blood flow to the scrotum. Twisting of a testicle (testicular torsion). A birth defect. A tumor or cancer of the testicle. Sometimes, the cause is not known. What are the signs or symptoms? A hydrocele feels like a water-filled balloon. It may also feel heavy. Other symptoms include: Swelling of the scrotum. The swelling may decrease when you lie down. You may also notice more swelling at night than in the morning. This is called a communicating hydrocele, in which the fluid in the scrotum goes back into the abdominal cavity when the position of the scrotum changes. Swelling of the groin. Mild discomfort in the scrotum. Pain. This can develop if the hydrocele was caused by infection or twisting. The larger the hydrocele, the more likely you are to have pain. Swelling may also cause pain. How is this diagnosed? This condition may be diagnosed based on a physical exam and your medical history. You may also have tests, including: Imaging tests, such as an ultrasound. A transillumination test. This test takes place in a dark room where a light is placed on the skin of the scrotum. Clear liquid will not impede the light and the scrotum will be illuminated. This helps a health care provider distinguish a hydrocele from a  tumor. Blood or urine tests. How is this treated? Most hydroceles go away on their own. If you have no discomfort or pain, your health care provider may suggest close monitoring of your condition until the condition goes away or symptoms develop. This is called watch and wait or watchful waiting. If treatment is needed, it may include: Treating an underlying condition. This may include taking an antibiotic medicine to treat an infection. Having surgery to stop fluid from collecting in the scrotum. Having surgery to drain the fluid. Surgery may include: Hydrocelectomy. For this procedure, an incision is made in the scrotum to remove the fluid. Needle aspiration. A needle is used to drain fluid. However, the fluid buildup will come back quickly and may lead to an infection of the scrotum. This is rarely done. Follow these instructions at home: Medicines Take over-the-counter and prescription medicines only as told by your health care provider. If you were prescribed an antibiotic medicine, take it as told by your health care provider. Do not stop taking the antibiotic even if you start to feel better. General instructions Watch the hydrocele for any changes. Keep all follow-up visits. This is important. Contact a health care provider if: You notice any changes in the hydrocele. The swelling in your scrotum or groin gets worse. The hydrocele becomes red, firm, painful, or tender to the touch. You have a fever. Get help right away if you: Develop a lot of pain or your pain becomes worse. Have chills. Have a high fever. Summary A hydrocele is   a collection of fluid in the loose pouch of skin that holds the testicles (scrotum). A hydrocele can cause swelling, discomfort, and pain. In adults, the cause of a hydrocele may not be known. However, it is sometimes caused by an infection or the twisting of a testicle. Hydroceles often go away on their own. If a hydrocele causes pain, treating the  underlying cause may be needed to ease the pain. This information is not intended to replace advice given to you by your health care provider. Make sure you discuss any questions you have with your health care provider. Document Revised: 02/18/2021 Document Reviewed: 02/18/2021 Elsevier Patient Education  2023 Elsevier Inc.  

## 2022-01-04 NOTE — Progress Notes (Signed)
01/04/22 9:11 AM   Steve Gilbert [birth year] [id number]  CC: History of bladder cancer, new right scrotal swelling  HPI: 71 year old male with history of bladder cancer ultimately treated with neoadjuvant chemotherapy and radical cystoprostatectomy and ileal conduit diversion with Dr. Tresa Moore in January 2020.  No evidence of recurrence since that time, and he follows regularly with oncology for surveillance imaging.  He reports acute onset of some right scrotal swelling that was nonpainful last week after a long car ride.  A scrotal ultrasound was ordered by PCP, and showed a small right hydrocele.  On my review this appears to be present on the prior CT from February 2023.  He denies any pain.   PMH: Past Medical History:  Diagnosis Date   Acid reflux    Allergy    Cancer of bladder neck ([place]) 02/2018   Chemo tx's and surgical resection.   COPD (chronic obstructive pulmonary disease) (HCC)    High cholesterol    Hyperlipidemia 04/03/2018   Hypertension    Pneumonia     Surgical History: Past Surgical History:  Procedure Laterality Date   CYSTOSCOPY W/ URETERAL STENT REMOVAL Right 08/10/2018   Procedure: CYSTOSCOPY WITH STENT REMOVAL;  Surgeon: Alexis Frock, MD;  [facility];  Service: Urology;  Laterality: Right;   CYSTOSCOPY WITH BIOPSY N/A 04/04/2018   Procedure: CYSTOSCOPY WITH TURBT;  Surgeon: Hollice Espy, MD;  Location: [facility];  Service: Urology;  Laterality: N/A;   CYSTOSCOPY WITH URETEROSCOPY AND STENT PLACEMENT Right 04/04/2018   Procedure: CYSTOSCOPY WITH URETEROSCOPY AND STENT PLACEMENT;  Surgeon: Hollice Espy, MD;  Location: [facility];  Service: Urology;  Laterality: Right;   LAPAROTOMY N/A 04/24/2021   Procedure: EXPLORATORY LAPAROTOMY; PARASTOMAL HERNIA REPAIR Sugerbaker technique  incisional hernia repair, right sided TAR;  Surgeon: Stechschulte, Nickola Major, MD;  [facility];  Service: General;  Laterality: N/A;   NASAL SINUS SURGERY     PORTA  CATH INSERTION N/A 04/23/2018   Procedure: PORTA CATH INSERTION;  Surgeon: Algernon Huxley, MD;  Location: [facility];  Service: Cardiovascular;  Laterality: N/A;   PORTA CATH REMOVAL N/A 09/04/2018   Procedure: PORTA CATH REMOVAL;  Surgeon: Katha Cabal, MD;  Location: [facility] [facility];  Service: Cardiovascular;  Laterality: N/A;   TEE WITHOUT CARDIOVERSION N/A 09/05/2018   Procedure: TRANSESOPHAGEAL ECHOCARDIOGRAM (TEE);  Surgeon: Minna Merritts, MD;  Location: [facility];  Service: Cardiovascular;  Laterality: N/A;   TRANSURETHRAL RESECTION OF PROSTATE N/A 04/04/2018   Procedure: TRANSURETHRAL RESECTION OF THE PROSTATE (TURP);  Surgeon: Hollice Espy, MD;  Location: [facility];  Service: Urology;  Laterality: N/A;   URETERAL BIOPSY Right 04/04/2018   Procedure: URETERAL BIOPSY;  Surgeon: Hollice Espy, MD;  Location: [facility];  Service: Urology;  Laterality: Right;     Family History: Family History  Problem Relation Age of Onset   Diabetes Mother    Heart disease Father    Diabetes Brother    Heart attack Brother    Heart disease Brother     Social History:  reports that he quit smoking about 14 years ago. His smoking use included cigarettes. He has a 45.00 pack-year smoking history. He has quit using smokeless tobacco. He reports current alcohol use of about 7.0 standard drinks of alcohol per week. He reports that he does not currently use drugs.  Physical Exam: BP (!) 149/92 (BP Location: Left Arm, Patient Position: Sitting, Cuff Size: Large)   Pulse 70   Ht  $'5\' 7"'I$  (1.702 m)   Wt 225 lb (102.1 kg)   BMI 35.24 kg/m    Constitutional:  Alert and oriented, No acute distress. Cardiovascular: No clubbing, cyanosis, or edema. Respiratory: Normal respiratory effort, no increased work of breathing. GI: Abdomen is soft, nontender, nondistended, no abdominal masses, right lower quadrant urostomy bag GU: Small right hydrocele, nontender, testicles 20 cc and descended  bilaterally without masses  Laboratory Data: Reviewed  Pertinent Imaging: I have personally viewed and interpreted the recent scrotal ultrasound showing a small right hydrocele, as well as the prior CT abdomen and pelvis from February 2023 that also shows a small right hydrocele.  Assessment & Plan:   71 year old male with history of bladder cancer treated with neoadjuvant chemotherapy and radical cystoprostatectomy in 2020 with no evidence of recurrence who presents with a painless small right hydrocele.  Reassurance was provided that this is not related to prior malignancy and is a benign process.  We discussed options including observation, aspiration, versus hydrocelectomy.  He is minimally bothered at this time and would like to pursue observation which is very reasonable.  Return precautions were discussed.  He also has some chronic groin itching, and I recommended trying some over-the-counter [employer] anti-itch powder or creams.  He had some questions about fitting his urostomy bag, and I directed him to his supplier to see if they can change his urostomy opening size.  -RTC 6 months symptom check, likely can follow-up with urology as needed at that time if doing well -Continue follow-up with oncology for surveillance with history of bladder cancer status post cystoprostatectomy and ileal conduit  Nickolas Madrid, MD 01/04/2022  [facility] [address] [place], [place] [address] [phone]

## 2022-01-21 DIAGNOSIS — C678 Malignant neoplasm of overlapping sites of bladder: Secondary | ICD-10-CM | POA: Diagnosis not present

## 2022-01-21 DIAGNOSIS — C775 Secondary and unspecified malignant neoplasm of intrapelvic lymph nodes: Secondary | ICD-10-CM | POA: Diagnosis not present

## 2022-01-21 DIAGNOSIS — N130 Hydronephrosis with ureteropelvic junction obstruction: Secondary | ICD-10-CM | POA: Diagnosis not present

## 2022-01-21 DIAGNOSIS — R7881 Bacteremia: Secondary | ICD-10-CM | POA: Diagnosis not present

## 2022-01-21 DIAGNOSIS — Z936 Other artificial openings of urinary tract status: Secondary | ICD-10-CM | POA: Diagnosis not present

## 2022-01-21 DIAGNOSIS — A4901 Methicillin susceptible Staphylococcus aureus infection, unspecified site: Secondary | ICD-10-CM | POA: Diagnosis not present

## 2022-02-10 DIAGNOSIS — I129 Hypertensive chronic kidney disease with stage 1 through stage 4 chronic kidney disease, or unspecified chronic kidney disease: Secondary | ICD-10-CM | POA: Diagnosis not present

## 2022-02-10 DIAGNOSIS — E785 Hyperlipidemia, unspecified: Secondary | ICD-10-CM | POA: Diagnosis not present

## 2022-02-10 DIAGNOSIS — Z9350 Unspecified cystostomy status: Secondary | ICD-10-CM | POA: Diagnosis not present

## 2022-02-10 DIAGNOSIS — J309 Allergic rhinitis, unspecified: Secondary | ICD-10-CM | POA: Diagnosis not present

## 2022-02-10 DIAGNOSIS — N1831 Chronic kidney disease, stage 3a: Secondary | ICD-10-CM | POA: Diagnosis not present

## 2022-02-10 DIAGNOSIS — R7303 Prediabetes: Secondary | ICD-10-CM | POA: Diagnosis not present

## 2022-02-10 DIAGNOSIS — Z7982 Long term (current) use of aspirin: Secondary | ICD-10-CM | POA: Diagnosis not present

## 2022-02-10 DIAGNOSIS — K219 Gastro-esophageal reflux disease without esophagitis: Secondary | ICD-10-CM | POA: Diagnosis not present

## 2022-02-10 DIAGNOSIS — Z87891 Personal history of nicotine dependence: Secondary | ICD-10-CM | POA: Diagnosis not present

## 2022-02-10 DIAGNOSIS — J439 Emphysema, unspecified: Secondary | ICD-10-CM | POA: Diagnosis not present

## 2022-02-10 DIAGNOSIS — I10 Essential (primary) hypertension: Secondary | ICD-10-CM | POA: Diagnosis not present

## 2022-03-11 DIAGNOSIS — J40 Bronchitis, not specified as acute or chronic: Secondary | ICD-10-CM | POA: Diagnosis not present

## 2022-03-11 DIAGNOSIS — J449 Chronic obstructive pulmonary disease, unspecified: Secondary | ICD-10-CM | POA: Diagnosis not present

## 2022-03-15 ENCOUNTER — Encounter: Payer: Self-pay | Admitting: Internal Medicine

## 2022-03-16 DIAGNOSIS — A4901 Methicillin susceptible Staphylococcus aureus infection, unspecified site: Secondary | ICD-10-CM | POA: Diagnosis not present

## 2022-03-16 DIAGNOSIS — C678 Malignant neoplasm of overlapping sites of bladder: Secondary | ICD-10-CM | POA: Diagnosis not present

## 2022-03-16 DIAGNOSIS — C775 Secondary and unspecified malignant neoplasm of intrapelvic lymph nodes: Secondary | ICD-10-CM | POA: Diagnosis not present

## 2022-03-16 DIAGNOSIS — N130 Hydronephrosis with ureteropelvic junction obstruction: Secondary | ICD-10-CM | POA: Diagnosis not present

## 2022-03-16 DIAGNOSIS — Z936 Other artificial openings of urinary tract status: Secondary | ICD-10-CM | POA: Diagnosis not present

## 2022-03-16 DIAGNOSIS — R7881 Bacteremia: Secondary | ICD-10-CM | POA: Diagnosis not present

## 2022-03-23 ENCOUNTER — Ambulatory Visit
Admission: RE | Admit: 2022-03-23 | Discharge: 2022-03-23 | Disposition: A | Discharge status: Home / Self Care | Payer: PPO | Source: Ambulatory Visit | Attending: Internal Medicine | Admitting: Internal Medicine

## 2022-03-23 DIAGNOSIS — C675 Malignant neoplasm of bladder neck: Secondary | ICD-10-CM | POA: Diagnosis not present

## 2022-03-23 DIAGNOSIS — C679 Malignant neoplasm of bladder, unspecified: Secondary | ICD-10-CM | POA: Diagnosis not present

## 2022-03-23 DIAGNOSIS — K769 Liver disease, unspecified: Secondary | ICD-10-CM | POA: Diagnosis not present

## 2022-03-24 DIAGNOSIS — R7303 Prediabetes: Secondary | ICD-10-CM | POA: Diagnosis not present

## 2022-03-24 DIAGNOSIS — Z125 Encounter for screening for malignant neoplasm of prostate: Secondary | ICD-10-CM | POA: Diagnosis not present

## 2022-03-24 DIAGNOSIS — I10 Essential (primary) hypertension: Secondary | ICD-10-CM | POA: Diagnosis not present

## 2022-03-24 DIAGNOSIS — N1831 Chronic kidney disease, stage 3a: Secondary | ICD-10-CM | POA: Diagnosis not present

## 2022-03-24 DIAGNOSIS — J449 Chronic obstructive pulmonary disease, unspecified: Secondary | ICD-10-CM | POA: Diagnosis not present

## 2022-03-24 DIAGNOSIS — Z0000 Encounter for general adult medical examination without abnormal findings: Secondary | ICD-10-CM | POA: Diagnosis not present

## 2022-03-25 ENCOUNTER — Inpatient Hospital Stay: Payer: PPO | Admitting: Internal Medicine

## 2022-03-25 ENCOUNTER — Inpatient Hospital Stay: Payer: PPO

## 2022-03-25 DIAGNOSIS — I10 Essential (primary) hypertension: Secondary | ICD-10-CM | POA: Diagnosis not present

## 2022-03-25 DIAGNOSIS — R509 Fever, unspecified: Secondary | ICD-10-CM | POA: Diagnosis not present

## 2022-03-25 DIAGNOSIS — R6889 Other general symptoms and signs: Secondary | ICD-10-CM | POA: Diagnosis not present

## 2022-03-25 DIAGNOSIS — R053 Chronic cough: Secondary | ICD-10-CM | POA: Diagnosis not present

## 2022-04-01 ENCOUNTER — Telehealth: Payer: Self-pay | Admitting: Medical Oncology

## 2022-04-01 DIAGNOSIS — E875 Hyperkalemia: Secondary | ICD-10-CM | POA: Diagnosis not present

## 2022-04-01 NOTE — Telephone Encounter (Signed)
No answer left message.

## 2022-04-05 ENCOUNTER — Telehealth: Payer: Self-pay

## 2022-04-05 ENCOUNTER — Other Ambulatory Visit: Payer: Self-pay | Admitting: Medical Oncology

## 2022-04-05 DIAGNOSIS — C675 Malignant neoplasm of bladder neck: Secondary | ICD-10-CM

## 2022-04-05 NOTE — Telephone Encounter (Signed)
Judson Roch, could you please enter and sign the order for PET?

## 2022-04-05 NOTE — Telephone Encounter (Signed)
Dr. Jacinto Reap, PET is scheduled for 04/21/22 with f/u for you on 04/26/22.  Would you like labs drawn?

## 2022-04-05 NOTE — Telephone Encounter (Signed)
Message received from Dr. B:    History of bladder cancer-SEP 2023-slightly abnormal CT scan.  Patient did not keep his appointment with me.   Please inform patient that I would recommend a PET scan for further evaluation-in the next 1 to 2 weeks and have him follow-up with me thereafter.

## 2022-04-05 NOTE — Telephone Encounter (Signed)
Patient is aware of MD recommendation.  Please schedule and inform pt of appt detials

## 2022-04-11 ENCOUNTER — Encounter: Payer: Self-pay | Admitting: Internal Medicine

## 2022-04-13 ENCOUNTER — Ambulatory Visit: Payer: PPO | Admitting: Internal Medicine

## 2022-04-13 ENCOUNTER — Other Ambulatory Visit: Payer: PPO

## 2022-04-21 ENCOUNTER — Ambulatory Visit
Admission: RE | Admit: 2022-04-21 | Discharge: 2022-04-21 | Disposition: A | Discharge status: Home / Self Care | Payer: PPO | Source: Ambulatory Visit | Attending: Medical Oncology | Admitting: Medical Oncology

## 2022-04-21 VITALS — Wt 217.0 lb

## 2022-04-21 DIAGNOSIS — C675 Malignant neoplasm of bladder neck: Secondary | ICD-10-CM | POA: Diagnosis not present

## 2022-04-21 DIAGNOSIS — R221 Localized swelling, mass and lump, neck: Secondary | ICD-10-CM | POA: Diagnosis not present

## 2022-04-21 DIAGNOSIS — C679 Malignant neoplasm of bladder, unspecified: Secondary | ICD-10-CM | POA: Diagnosis not present

## 2022-04-21 DIAGNOSIS — Z9079 Acquired absence of other genital organ(s): Secondary | ICD-10-CM | POA: Insufficient documentation

## 2022-04-21 DIAGNOSIS — Z8616 Personal history of COVID-19: Secondary | ICD-10-CM | POA: Insufficient documentation

## 2022-04-21 DIAGNOSIS — I700 Atherosclerosis of aorta: Secondary | ICD-10-CM | POA: Diagnosis not present

## 2022-04-21 LAB — GLUCOSE, CAPILLARY: Glucose-Capillary: 101 mg/dL — ABNORMAL HIGH (ref 70–99)

## 2022-04-21 MED ORDER — FLUDEOXYGLUCOSE F - 18 (FDG) INJECTION
11.8900 | Freq: Once | INTRAVENOUS | Status: AC | PRN
  Administered 2022-04-21: 11.89 via INTRAVENOUS

## 2022-04-26 ENCOUNTER — Inpatient Hospital Stay: Payer: PPO | Attending: Internal Medicine

## 2022-04-26 ENCOUNTER — Encounter: Payer: Self-pay | Admitting: Internal Medicine

## 2022-04-26 ENCOUNTER — Inpatient Hospital Stay: Payer: PPO

## 2022-04-26 ENCOUNTER — Other Ambulatory Visit: Payer: PPO

## 2022-04-26 ENCOUNTER — Ambulatory Visit: Payer: PPO | Admitting: Internal Medicine

## 2022-04-26 ENCOUNTER — Inpatient Hospital Stay (HOSPITAL_BASED_OUTPATIENT_CLINIC_OR_DEPARTMENT_OTHER): Payer: PPO | Admitting: Internal Medicine

## 2022-04-26 VITALS — BP 123/88 | HR 76 | Temp 97.1°F | Resp 18 | Wt 219.2 lb

## 2022-04-26 DIAGNOSIS — N183 Chronic kidney disease, stage 3 unspecified: Secondary | ICD-10-CM | POA: Diagnosis not present

## 2022-04-26 DIAGNOSIS — I129 Hypertensive chronic kidney disease with stage 1 through stage 4 chronic kidney disease, or unspecified chronic kidney disease: Secondary | ICD-10-CM | POA: Insufficient documentation

## 2022-04-26 DIAGNOSIS — C675 Malignant neoplasm of bladder neck: Secondary | ICD-10-CM | POA: Diagnosis not present

## 2022-04-26 DIAGNOSIS — Z23 Encounter for immunization: Secondary | ICD-10-CM

## 2022-04-26 DIAGNOSIS — R221 Localized swelling, mass and lump, neck: Secondary | ICD-10-CM

## 2022-04-26 DIAGNOSIS — Z87891 Personal history of nicotine dependence: Secondary | ICD-10-CM | POA: Insufficient documentation

## 2022-04-26 DIAGNOSIS — Z79899 Other long term (current) drug therapy: Secondary | ICD-10-CM | POA: Diagnosis not present

## 2022-04-26 DIAGNOSIS — J449 Chronic obstructive pulmonary disease, unspecified: Secondary | ICD-10-CM | POA: Insufficient documentation

## 2022-04-26 LAB — CBC WITH DIFFERENTIAL/PLATELET
Abs Immature Granulocytes: 0.04 10*3/uL (ref 0.00–0.07)
Basophils Absolute: 0.1 10*3/uL (ref 0.0–0.1)
Basophils Relative: 1 %
Eosinophils Absolute: 0.1 10*3/uL (ref 0.0–0.5)
Eosinophils Relative: 2 %
HCT: 41.4 % (ref 39.0–52.0)
Hemoglobin: 14 g/dL (ref 13.0–17.0)
Immature Granulocytes: 1 %
Lymphocytes Relative: 14 %
Lymphs Abs: 0.8 10*3/uL (ref 0.7–4.0)
MCH: 30.6 pg (ref 26.0–34.0)
MCHC: 33.8 g/dL (ref 30.0–36.0)
MCV: 90.4 fL (ref 80.0–100.0)
Monocytes Absolute: 1 10*3/uL (ref 0.1–1.0)
Monocytes Relative: 17 %
Neutro Abs: 3.6 10*3/uL (ref 1.7–7.7)
Neutrophils Relative %: 65 %
Platelets: 194 10*3/uL (ref 150–400)
RBC: 4.58 MIL/uL (ref 4.22–5.81)
RDW: 13 % (ref 11.5–15.5)
WBC: 5.6 10*3/uL (ref 4.0–10.5)
nRBC: 0 % (ref 0.0–0.2)

## 2022-04-26 LAB — COMPREHENSIVE METABOLIC PANEL
ALT: 16 U/L (ref 0–44)
AST: 20 U/L (ref 15–41)
Albumin: 3.8 g/dL (ref 3.5–5.0)
Alkaline Phosphatase: 72 U/L (ref 38–126)
Anion gap: 6 (ref 5–15)
BUN: 21 mg/dL (ref 8–23)
CO2: 26 mmol/L (ref 22–32)
Calcium: 9 mg/dL (ref 8.9–10.3)
Chloride: 108 mmol/L (ref 98–111)
Creatinine, Ser: 1.59 mg/dL — ABNORMAL HIGH (ref 0.61–1.24)
GFR, Estimated: 46 mL/min — ABNORMAL LOW (ref >60)
Glucose, Bld: 108 mg/dL — ABNORMAL HIGH (ref 70–99)
Potassium: 4.4 mmol/L (ref 3.5–5.1)
Sodium: 140 mmol/L (ref 135–145)
Total Bilirubin: 1.1 mg/dL (ref 0.3–1.2)
Total Protein: 7.3 g/dL (ref 6.5–8.1)

## 2022-04-26 MED ORDER — INFLUENZA VAC A&B SA ADJ QUAD 0.5 ML IM PRSY
0.5000 mL | PREFILLED_SYRINGE | Freq: Once | INTRAMUSCULAR | Status: AC
  Administered 2022-04-26: 0.5 mL via INTRAMUSCULAR
  Filled 2022-04-26: qty 0.5

## 2022-04-26 MED ORDER — INFLUENZA VAC A&B SA ADJ QUAD 0.5 ML IM PRSY: 0.5000 mL | PREFILLED_SYRINGE | Freq: Once | INTRAMUSCULAR | Status: DC

## 2022-04-26 NOTE — Progress Notes (Signed)
Patient denies new problems/concerns today.   °

## 2022-04-26 NOTE — Assessment & Plan Note (Addendum)
#  Transitional cell bladder cancer neo-adjuvant chemo-s/p 4 cycles of dose dense MVAC; status post cystectomy [JAN 2021]. SEP 2023: New ill-defined focal soft tissue located adjacent to the wall of a distal small bowel loop with surrounding fat stranding. PET scan OCT 2023-no evidence of metastatic disease noted; see discussion below regarding left submandibular nodule.  #Left submandibular nodule-PET avid- ?  Metastasis versus others.  Recommend ENT evaluation.  Ordered left neck soft tissue ultrasound.  Based on the results we will consider biopsy.  # Chronic kidney disease-stage III- GFR- 48- worse awaiting re-evaluation with nephology-  # HNT-on metoprolol.  Recommend follow up PCP re: further refills.  I spoke at length with the patient's SIL, Pam- - regarding the patient's clinical status/plan of care.  Family agreement.   #Incidental findings on Imaging CT, 2023:table scattered small subcentimeter liver lesions are too small to characterize, but likely benign hepatic cysts;  I reviewed/discussed/counseled the patient.   # Vaccination: proceed with Flu shot today.    # DISPOSITION: # Left neck Mass Ultrasound  # Flu shot today # ENT referral re: left neck nodule/ PET avid; Hx of bladder cancer. # follow up in 6 months/labs-MD;  cbc/cmp;-Dr.B  # I reviewed the blood work- with the patient in detail; also reviewed the imaging independently [as summarized above]; and with the patient in detail.

## 2022-04-26 NOTE — Progress Notes (Signed)
[facility] NOTE  Patient Care Team: Kirk Ruths, MD as PCP - General (Internal Medicine) Hollice Espy, MD as Consulting Physician (Urology) Cammie Sickle, MD as Consulting Physician (Internal Medicine)  CHIEF COMPLAINTS/PURPOSE OF CONSULTATION:  Bladder cancer  #  Oncology History Overview Note  # BLADDER CANCER: pT2/cpT4  prostate/bladder neck Dr.Brandon TURP s/p  - INVASIVE UROTHELIAL CARCINOMA, HIGH-GRADE;  TUMOR INVADES BLADDER NECK MUSCULARIS PROPRIA. Mild-mod Right hydronephrosis [facility] RISK FEATURES]   # OCt 9t ddMVAC x4; Jan 24th- 2021 cystectomy [Dr.Manny; [place]] ypTis ypN1; stage III suriveillaince  # Feb 2020- UTI/sepsis- ICU  x3 weeks IV antibiotics/ s-p port removal.   # 2 Echo- 50% to 55%. [oct 1914]  # COPD/ not on O2; [quit 2009];    DIAGNOSIS: BLADDER CA  STAGE: Stage III;GOALS: cure  CURRENT/MOST RECENT THERAPY : Surveillance    Cancer of bladder neck ([place])    HISTORY OF PRESENTING ILLNESS: Alone.  Ambulating independently.  Steve Gilbert 71 y.o.  male with muscle invasive bladder cancer status post neoadjuvant chemotherapy-followed by cystectomy currently on surveillance is here for follow-up/ review results of the CT scan; and PET scan.  Patient had a surveillance contrast CT scan that was abnormal-for possible mesenteric metastases.  He stated he believes that the CT scan; and the PET scan.  In the interim patient was diagnosed with COVID.  He has recovered fairly well.  No complications.   He did notice a lump in the left submandibular region in the last few months.  Not growing.  Not hurting. Patient denies any new onset of back pain or joint pains.  Shortness of breath or cough no bone pain.  No nausea no vomiting.  No worsening shortness of breath or chest pain.  Review of Systems  Constitutional:  Negative for chills, diaphoresis, fever and weight loss.  HENT:  Negative for nosebleeds and sore throat.    Eyes:  Negative for double vision.  Cardiovascular:  Negative for chest pain, palpitations, orthopnea and leg swelling.  Gastrointestinal:  Negative for abdominal pain, blood in stool, constipation, diarrhea, heartburn, melena, nausea and vomiting.  Genitourinary:  Negative for dysuria, frequency and urgency.  Musculoskeletal:  Positive for back pain and joint pain.  Neurological:  Negative for dizziness, tingling, focal weakness, weakness and headaches.  Endo/Heme/Allergies:  Does not bruise/bleed easily.  Psychiatric/Behavioral:  Negative for depression. The patient is not nervous/anxious and does not have insomnia.      MEDICAL HISTORY:  Past Medical History:  Diagnosis Date  . Acid reflux   . Allergy   . Cancer of bladder neck ([place]) 02/2018   Chemo tx's and surgical resection.  Marland Kitchen COPD (chronic obstructive pulmonary disease) ([place])   . High cholesterol   . Hyperlipidemia 04/03/2018  . Hypertension   . Pneumonia     SURGICAL HISTORY: Past Surgical History:  Procedure Laterality Date  . CYSTOSCOPY W/ URETERAL STENT REMOVAL Right 08/10/2018   Procedure: CYSTOSCOPY WITH STENT REMOVAL;  Surgeon: Alexis Frock, MD;  Location: [facility];  Service: Urology;  Laterality: Right;  . CYSTOSCOPY WITH BIOPSY N/A 04/04/2018   Procedure: CYSTOSCOPY WITH TURBT;  Surgeon: Hollice Espy, MD;  Location: [facility];  Service: Urology;  Laterality: N/A;  . CYSTOSCOPY WITH URETEROSCOPY AND STENT PLACEMENT Right 04/04/2018   Procedure: CYSTOSCOPY WITH URETEROSCOPY AND STENT PLACEMENT;  Surgeon: Hollice Espy, MD;  Location: [facility];  Service: Urology;  Laterality: Right;  . LAPAROTOMY N/A 04/24/2021   Procedure: EXPLORATORY LAPAROTOMY; PARASTOMAL  HERNIA REPAIR Sugerbaker technique  incisional hernia repair, right sided TAR;  Surgeon: Stechschulte, Nickola Major, MD;  Location: [facility];  Service: General;  Laterality: N/A;  . NASAL SINUS SURGERY    . PORTA CATH INSERTION N/A 04/23/2018   Procedure: PORTA CATH  INSERTION;  Surgeon: Algernon Huxley, MD;  Location: [place] [facility];  Service: Cardiovascular;  Laterality: N/A;  . PORTA CATH REMOVAL N/A 09/04/2018   Procedure: PORTA CATH REMOVAL;  Surgeon: Katha Cabal, MD;  Location: [place] [facility];  Service: Cardiovascular;  Laterality: N/A;  . TEE WITHOUT CARDIOVERSION N/A 09/05/2018   Procedure: TRANSESOPHAGEAL ECHOCARDIOGRAM (TEE);  Surgeon: Minna Merritts, MD;  Location: [facility];  Service: Cardiovascular;  Laterality: N/A;  . TRANSURETHRAL RESECTION OF PROSTATE N/A 04/04/2018   Procedure: TRANSURETHRAL RESECTION OF THE PROSTATE (TURP);  Surgeon: Hollice Espy, MD;  Location: [facility];  Service: Urology;  Laterality: N/A;  . URETERAL BIOPSY Right 04/04/2018   Procedure: URETERAL BIOPSY;  Surgeon: Hollice Espy, MD;  Location: [facility];  Service: Urology;  Laterality: Right;     SOCIAL HISTORY:  Social History   Socioeconomic History  . Marital status: Widowed    Spouse name: Not on file  . Number of children: Not on file  . Years of education: Not on file  . Highest education level: Not on file  Occupational History  . Not on file  Tobacco Use  . Smoking status: Former    Packs/day: 1.50    Years: 30.00    Total pack years: 45.00    Types: Cigarettes    Quit date: 2009    Years since quitting: 14.7  . Smokeless tobacco: Former  Media planner  . Vaping Use: Never used  Substance and Sexual Activity  . Alcohol use: Yes    Alcohol/week: 7.0 standard drinks of alcohol    Types: 5 Cans of beer, 2 Standard drinks or equivalent per week    Comment: ocassional   . Drug use: Not Currently  . Sexual activity: Not Currently  Other Topics Concern  . Not on file  Social History Narrative   currently retired.  Works part-time.  Remote history of smoking.  No alcohol.   Social Determinants of Health   Financial Resource Strain: Low Risk  (08/09/2018)   Overall Financial Resource Strain (CARDIA)   . Difficulty of Paying Living  Expenses: Not hard at all  Food Insecurity: Unknown (08/09/2018)   Hunger Vital Sign   . Worried About Charity fundraiser in the Last Year: Patient refused   . Ran Out of Food in the Last Year: Patient refused  Transportation Needs: Unknown (08/09/2018)   [place] - Transportation   . Lack of Transportation (Medical): Patient refused   . Lack of Transportation (Non-Medical): Patient refused  Physical Activity: Unknown (08/09/2018)   Exercise Vital Sign   . Days of Exercise per Week: Patient refused   . Minutes of Exercise per Session: Patient refused  Stress: No Stress Concern Present (08/09/2018)   [place]   . Feeling of Stress : Not at all  Social Connections: Unknown (08/09/2018)   Social Connection and Isolation Panel [NHANES]   . Frequency of Communication with Friends and Family: Patient refused   . Frequency of Social Gatherings with Friends and Family: Patient refused   . Attends Religious Services: Patient refused   . Active Member of Clubs or Organizations: Patient refused   . Attends Club  or Organization Meetings: Patient refused   . Marital Status: Patient refused  Intimate Partner Violence: Unknown (08/09/2018)   Humiliation, Afraid, Rape, and Kick questionnaire   . Fear of Current or Ex-Partner: Patient refused   . Emotionally Abused: Patient refused   . Physically Abused: Patient refused   . Sexually Abused: Patient refused    FAMILY HISTORY: Family History  Problem Relation Age of Onset  . Diabetes Mother   . Heart disease Father   . Diabetes Brother   . Heart attack Brother   . Heart disease Brother     ALLERGIES:  is allergic to contrast media [iodinated contrast media].  MEDICATIONS:  Current Outpatient Medications  Medication Sig Dispense Refill  . amLODipine (NORVASC) 10 MG tablet TAKE 1 TABLET(10 MG) BY MOUTH EVERY DAY    . aspirin EC 81 MG tablet Take 81 mg by mouth daily.    Marland Kitchen  atorvastatin (LIPITOR) 20 MG tablet Take 20 mg by mouth daily.    Marland Kitchen azelastine (ASTELIN) 0.1 % nasal spray Place 1 spray into both nostrils 2 (two) times daily.    . fluticasone-salmeterol (ADVAIR) 250-50 MCG/ACT AEPB Inhale 1 puff into the lungs 2 (two) times daily.    . metoprolol tartrate (LOPRESSOR) 25 MG tablet TAKE 1 TABLET(25 MG) BY MOUTH TWICE DAILY 180 tablet 1  . Multiple Vitamins-Minerals (MULTIVITAMIN WITH MINERALS) tablet Take 1 tablet by mouth daily.    . pantoprazole (PROTONIX) 40 MG tablet Take 40 mg by mouth every evening.     . tiotropium (SPIRIVA HANDIHALER) 18 MCG inhalation capsule Place 18 mcg into inhaler and inhale daily.    Marland Kitchen acetaminophen (TYLENOL) 325 MG tablet Take 2 tablets (650 mg total) by mouth every 6 (six) hours.    Marland Kitchen levocetirizine (XYZAL) 5 MG tablet Take by mouth.    . methocarbamol (ROBAXIN) 500 MG tablet Take 1 tablet (500 mg total) by mouth every 6 (six) hours as needed for muscle spasms. 40 tablet 1  . traMADol (ULTRAM) 50 MG tablet Take 1 tablet (50 mg total) by mouth every 6 (six) hours as needed. 20 tablet 0   Current Facility-Administered Medications  Medication Dose Route Frequency Provider Last Rate Last Admin  . influenza vaccine adjuvanted (FLUAD) injection 0.5 mL  0.5 mL Intramuscular Once Charlaine Dalton R, MD          .  PHYSICAL EXAMINATION: ECOG PERFORMANCE STATUS: 1 - Symptomatic but completely ambulatory  Vitals:   04/26/22 0800  BP: 123/88  Pulse: 76  Resp: 18  Temp: (!) 97.1 F (36.2 C)   Filed Weights   04/26/22 0800  Weight: 219 lb 3.2 oz (99.4 kg)   Left submandibular nodule/lymph node.  About 2 cm in size.  Nontender.   Physical Exam Constitutional:      Comments: He is alone.  Walking himself.  HENT:     Head: Normocephalic and atraumatic.     Mouth/Throat:     Pharynx: No oropharyngeal exudate.  Eyes:     Pupils: Pupils are equal, round, and reactive to light.  Cardiovascular:     Rate and Rhythm:  Normal rate and regular rhythm.  Pulmonary:     Effort: No respiratory distress.     Breath sounds: Normal breath sounds.  Abdominal:     General: Bowel sounds are normal. There is no distension.     Palpations: Abdomen is soft. There is no mass.     Tenderness: There is no abdominal tenderness. There is no  guarding or rebound.     Comments: Urostomy.   Musculoskeletal:        General: No tenderness. Normal range of motion.     Cervical back: Normal range of motion and neck supple.  Skin:    General: Skin is warm.  Neurological:     Mental Status: He is alert and oriented to person, place, and time.  Psychiatric:        Mood and Affect: Affect normal.     LABORATORY DATA:  I have reviewed the data as listed Lab Results  Component Value Date   WBC 5.6 04/26/2022   HGB 14.0 04/26/2022   HCT 41.4 04/26/2022   MCV 90.4 04/26/2022   PLT 194 04/26/2022   Recent Labs    04/29/21 0410 09/24/21 1035 04/26/22 0813  NA 150* 136 140  K 3.1* 4.4 4.4  CL 114* 102 108  CO2 '27 27 26  '$ GLUCOSE 109* 99 108*  BUN '22 17 21  '$ CREATININE 0.90 1.31* 1.59*  CALCIUM 9.0 9.1 9.0  GFRNONAA >60 59* 46*  PROT  --  7.2 7.3  ALBUMIN  --  4.1 3.8  AST  --  27 20  ALT  --  27 16  ALKPHOS  --  74 72  BILITOT  --  1.6* 1.1    RADIOGRAPHIC STUDIES: I have personally reviewed the radiological images as listed and agreed with the findings in the report. NM PET Image Initial (PI) Skull Base To Thigh  Result Date: 04/22/2022 CLINICAL DATA:  Subsequent treatment strategy for bladder cancer. EXAM: NUCLEAR MEDICINE PET SKULL BASE TO THIGH TECHNIQUE: 11.89 mCi F-18 FDG was injected intravenously. Full-ring PET imaging was performed from the skull base to thigh after the radiotracer. CT data was obtained and used for attenuation correction and anatomic localization. Fasting blood glucose: 101 mg/dl COMPARISON:  CT AP 03/23/2022 FINDINGS: Mediastinal blood pool activity: SUV max 2.14 Liver activity: SUV max  NA NECK: There is a FDG avid mass within the left side of neck just anterior and inferior to the left parotid gland and lateral to the left submandibular gland. This measures 2.4 x 1.7 cm with SUV max of 7.72, image 41/2. Incidental CT findings: None. CHEST: No tracer avid axillary, supraclavicular, mediastinal, or hilar lymph nodes. No tracer avid pulmonary nodule or mass. Incidental CT findings: Aortic atherosclerosis and coronary artery calcifications. ABDOMEN/PELVIS: There is no abnormal tracer uptake within the liver, pancreas, spleen or adrenal glands. No tracer avid pulmonary non scratch set no tracer avid adenopathy within the abdomen or pelvis. Status post cystoprostatectomy. No signs of locally recurrent tracer avid disease within the pelvis. Again seen is an area of soft tissue stranding adjacent to a loop of small bowel just proximal to the diverting loop ileostomy. No abnormal tracer uptake is identified within the area of soft tissue stranding which is favored to represent an area of fat necrosis. Incidental CT findings: Postoperative changes from previous cystoprostatectomy with ileal conduit urinary diversion. There is a parastomal hernia at the ostomy site containing nonobstructed loops of small bowel. Aortic atherosclerotic calcifications. Right hydrocele SKELETON: No focal hypermetabolic activity to suggest skeletal metastasis. Incidental CT findings: None. IMPRESSION: 1. Status post cystoprostatectomy with ileal conduit urinary diversion. No signs of FDG avid locally recurrent tumor. No evidence for tracer avid abdominopelvic adenopathy or solid organ metastasis. 2. There is an indeterminate FDG avid mass within the left side of neck just anterior and inferior to the left parotid gland and lateral to  the left submandibular gland. This would be an unusual location for nodal metastasis in the absence of recurrent/metastatic disease within the chest, abdomen or pelvis. Primary head neck neoplasm  cannot be excluded. Recommend more definitive characterization with soft tissue neck ultrasound over the area of concern within the left submandibular region with possible percutaneous image guided needle biopsy for more definitive diagnosis. 3.  Aortic Atherosclerosis (ICD10-I70.0). Electronically Signed   By: Kerby Moors M.D.   On: 04/22/2022 17:35    ASSESSMENT & PLAN:   Cancer of bladder neck ([place]) #Transitional cell bladder cancer neo-adjuvant chemo-s/p 4 cycles of dose dense MVAC; status post cystectomy [JAN 2021]. SEP 2023: New ill-defined focal soft tissue located adjacent to the wall of a distal small bowel loop with surrounding fat stranding. PET scan OCT 2023-no evidence of metastatic disease noted; see discussion below regarding left submandibular nodule.  #Left submandibular nodule-PET avid- ?  Metastasis versus others.  Recommend ENT evaluation.  Ordered left neck soft tissue ultrasound.  Based on the results we will consider biopsy.  # Chronic kidney disease-stage III- GFR- 48- worse awaiting re-evaluation with nephology-  # HNT-on metoprolol.  Recommend follow up PCP re: further refills.  I spoke at length with the patient's SIL, Pam- - regarding the patient's clinical status/plan of care.  Family agreement.   #Incidental findings on Imaging CT, 2023:table scattered small subcentimeter liver lesions are too small to characterize, but likely benign hepatic cysts;  I reviewed/discussed/counseled the patient.   # Vaccination: proceed with Flu shot today.    # DISPOSITION: # Left neck Mass Ultrasound  # Flu shot today # ENT referral re: left neck nodule/ PET avid; Hx of bladder cancer. # follow up in 6 months/labs-MD;  cbc/cmp;-Dr.B  # I reviewed the blood work- with the patient in detail; also reviewed the imaging independently [as summarized above]; and with the patient in detail.       All questions were answered. The patient knows to call the clinic with any  problems, questions or concerns.     Cammie Sickle, MD 04/26/2022 10:15 AM

## 2022-04-28 ENCOUNTER — Ambulatory Visit
Admission: RE | Admit: 2022-04-28 | Discharge: 2022-04-28 | Disposition: A | Discharge status: Home / Self Care | Payer: PPO | Source: Ambulatory Visit | Attending: Internal Medicine | Admitting: Internal Medicine

## 2022-04-28 DIAGNOSIS — C675 Malignant neoplasm of bladder neck: Secondary | ICD-10-CM | POA: Diagnosis not present

## 2022-04-28 DIAGNOSIS — R221 Localized swelling, mass and lump, neck: Secondary | ICD-10-CM | POA: Diagnosis not present

## 2022-04-28 DIAGNOSIS — K111 Hypertrophy of salivary gland: Secondary | ICD-10-CM | POA: Diagnosis not present

## 2022-04-28 DIAGNOSIS — Z8551 Personal history of malignant neoplasm of bladder: Secondary | ICD-10-CM | POA: Diagnosis not present

## 2022-04-29 ENCOUNTER — Telehealth: Payer: Self-pay | Admitting: *Deleted

## 2022-04-29 ENCOUNTER — Encounter: Payer: Self-pay | Admitting: General Practice

## 2022-04-29 ENCOUNTER — Other Ambulatory Visit: Payer: Self-pay | Admitting: Internal Medicine

## 2022-04-29 DIAGNOSIS — C675 Malignant neoplasm of bladder neck: Secondary | ICD-10-CM

## 2022-04-29 DIAGNOSIS — R221 Localized swelling, mass and lump, neck: Secondary | ICD-10-CM

## 2022-04-29 NOTE — Telephone Encounter (Signed)
error 

## 2022-04-29 NOTE — Progress Notes (Unsigned)
Arne Cleveland, MD  Allen Kell, NT Ok   [name] core L cerv LAN  Met v lymphoma   DDH        Previous Messages    ----- Message -----  From: Allen Kell, NT  Sent: 04/29/2022  10:11 AM EDT  To: [name]; [name]  Subject: ASAP [name] Core Biopsy Lymph Nodes                 Procedure:US Core Biopsy Lymph Nodes ASAP   Reason:Lymphadenopathy; left neck- Hx of bladder cancer   History:US, CT, NM in chart completed   [name], [name]   UVQQUIV:[phone]

## 2022-04-29 NOTE — Progress Notes (Signed)
I sent a message to the patient getting a biopsy of the neck lymph node.  Biopsy ordered.  Please schedule a biopsy ASAP.  Follow-up with MD-4 to 5 days after the biopsy; no labs.  GB

## 2022-05-02 ENCOUNTER — Telehealth: Payer: Self-pay | Admitting: *Deleted

## 2022-05-02 NOTE — Telephone Encounter (Signed)
Call placed to patient to update him with appointment time for biopsy on 10/19. Patient is scheduled for 2:00 pm, will need to arrive at 1:30 pm. Patient advised that he will need someone to drive him for procedure. Patient verbalized understanding of appointment details.

## 2022-05-03 DIAGNOSIS — C775 Secondary and unspecified malignant neoplasm of intrapelvic lymph nodes: Secondary | ICD-10-CM | POA: Diagnosis not present

## 2022-05-03 DIAGNOSIS — N1831 Chronic kidney disease, stage 3a: Secondary | ICD-10-CM | POA: Diagnosis not present

## 2022-05-03 DIAGNOSIS — A4901 Methicillin susceptible Staphylococcus aureus infection, unspecified site: Secondary | ICD-10-CM | POA: Diagnosis not present

## 2022-05-03 DIAGNOSIS — R7881 Bacteremia: Secondary | ICD-10-CM | POA: Diagnosis not present

## 2022-05-03 DIAGNOSIS — C678 Malignant neoplasm of overlapping sites of bladder: Secondary | ICD-10-CM | POA: Diagnosis not present

## 2022-05-03 DIAGNOSIS — I10 Essential (primary) hypertension: Secondary | ICD-10-CM | POA: Diagnosis not present

## 2022-05-03 DIAGNOSIS — N130 Hydronephrosis with ureteropelvic junction obstruction: Secondary | ICD-10-CM | POA: Diagnosis not present

## 2022-05-03 DIAGNOSIS — Z936 Other artificial openings of urinary tract status: Secondary | ICD-10-CM | POA: Diagnosis not present

## 2022-05-05 ENCOUNTER — Ambulatory Visit
Admission: RE | Admit: 2022-05-05 | Discharge: 2022-05-05 | Disposition: A | Discharge status: Home / Self Care | Payer: PPO | Source: Ambulatory Visit | Attending: Internal Medicine | Admitting: Internal Medicine

## 2022-05-05 DIAGNOSIS — Z8551 Personal history of malignant neoplasm of bladder: Secondary | ICD-10-CM | POA: Diagnosis not present

## 2022-05-05 DIAGNOSIS — R221 Localized swelling, mass and lump, neck: Secondary | ICD-10-CM | POA: Diagnosis not present

## 2022-05-05 DIAGNOSIS — C675 Malignant neoplasm of bladder neck: Secondary | ICD-10-CM | POA: Diagnosis not present

## 2022-05-05 DIAGNOSIS — D117 Benign neoplasm of other major salivary glands: Secondary | ICD-10-CM | POA: Diagnosis not present

## 2022-05-05 DIAGNOSIS — R590 Localized enlarged lymph nodes: Secondary | ICD-10-CM | POA: Diagnosis not present

## 2022-05-05 MED ORDER — LIDOCAINE HCL (PF) 1 % IJ SOLN
10.0000 mL | Freq: Once | INTRAMUSCULAR | Status: AC
  Administered 2022-05-05: 10 mL via INTRADERMAL

## 2022-05-05 NOTE — Progress Notes (Signed)
Patient for Steve Gilbert LT LN Bx on Thurs 05/05/22, I called and spoke with the patient on the phone and gave pre-procedure instructions. Pt was made aware to be here at 1330. Pt is ok with local sedation for this procedure. Pt will not need driver post procedure/recovery/discharge. Pt stated understanding. Called 05/02/22

## 2022-05-09 LAB — SURGICAL PATHOLOGY

## 2022-05-11 ENCOUNTER — Inpatient Hospital Stay: Payer: PPO | Admitting: Internal Medicine

## 2022-05-11 ENCOUNTER — Encounter: Payer: Self-pay | Admitting: Internal Medicine

## 2022-05-11 DIAGNOSIS — C675 Malignant neoplasm of bladder neck: Secondary | ICD-10-CM

## 2022-05-11 NOTE — Progress Notes (Signed)
[place] NOTE  Patient Care Team: Kirk Ruths, MD as PCP - General (Internal Medicine) Hollice Espy, MD as Consulting Physician (Urology) Cammie Sickle, MD as Consulting Physician (Internal Medicine)  CHIEF COMPLAINTS/PURPOSE OF CONSULTATION:  Bladder cancer  #  Oncology History Overview Note  # BLADDER CANCER: pT2/cpT4  prostate/bladder neck Dr.Brandon TURP s/p  - INVASIVE UROTHELIAL CARCINOMA, HIGH-GRADE;  TUMOR INVADES BLADDER NECK MUSCULARIS PROPRIA. Mild-mod Right hydronephrosis [facility] RISK FEATURES]   # OCt 9t ddMVAC x4; Jan 24th- 2021 cystectomy [Dr.Manny; [place]] ypTis ypN1; stage III suriveillaince  # Feb 2020- UTI/sepsis- ICU  x3 weeks IV antibiotics/ s-p port removal.   # 2 Echo- 50% to 55%. [oct 7322]  # COPD/ not on O2; [quit 2009];    DIAGNOSIS: BLADDER CA  STAGE: Stage III;GOALS: cure  CURRENT/MOST RECENT THERAPY : Surveillance    Cancer of bladder neck ([place])    HISTORY OF PRESENTING ILLNESS: Alone.  Ambulating independently.  Steve Gilbert Expose 71 y.o.  male with muscle invasive bladder cancer status post neoadjuvant chemotherapy-followed by cystectomy currently on surveillance is here for follow-up/ review results neck lymph node biopsy  Incidentally noted to have a neck lymph node on the PET scan ordered for the evaluation of his bladder cancer surveillance.  He did notice a lump in the left submandibular region in the last few months.  Not growing.  Not hurting. Patient denies any new onset of back pain or joint pains.  Shortness of breath or cough no bone pain.  No nausea no vomiting.  No worsening shortness of breath or chest pain.  Review of Systems  Constitutional:  Negative for chills, diaphoresis, fever and weight loss.  HENT:  Negative for nosebleeds and sore throat.   Eyes:  Negative for double vision.  Cardiovascular:  Negative for chest pain, palpitations, orthopnea and leg swelling.   Gastrointestinal:  Negative for abdominal pain, blood in stool, constipation, diarrhea, heartburn, melena, nausea and vomiting.  Genitourinary:  Negative for dysuria, frequency and urgency.  Musculoskeletal:  Positive for back pain and joint pain.  Neurological:  Negative for dizziness, tingling, focal weakness, weakness and headaches.  Endo/Heme/Allergies:  Does not bruise/bleed easily.  Psychiatric/Behavioral:  Negative for depression. The patient is not nervous/anxious and does not have insomnia.      MEDICAL HISTORY:  Past Medical History:  Diagnosis Date   Acid reflux    Allergy    Cancer of bladder neck ([place]) 02/2018   Chemo tx's and surgical resection.   COPD (chronic obstructive pulmonary disease) (HCC)    High cholesterol    Hyperlipidemia 04/03/2018   Hypertension    Pneumonia     SURGICAL HISTORY: Past Surgical History:  Procedure Laterality Date   CYSTOSCOPY W/ URETERAL STENT REMOVAL Right 08/10/2018   Procedure: CYSTOSCOPY WITH STENT REMOVAL;  Surgeon: Alexis Frock, MD;  Location: [facility];  Service: Urology;  Laterality: Right;   CYSTOSCOPY WITH BIOPSY N/A 04/04/2018   Procedure: CYSTOSCOPY WITH TURBT;  Surgeon: Hollice Espy, MD;  Location: [facility];  Service: Urology;  Laterality: N/A;   CYSTOSCOPY WITH URETEROSCOPY AND STENT PLACEMENT Right 04/04/2018   Procedure: CYSTOSCOPY WITH URETEROSCOPY AND STENT PLACEMENT;  Surgeon: Hollice Espy, MD;  Location: [facility];  Service: Urology;  Laterality: Right;   LAPAROTOMY N/A 04/24/2021   Procedure: EXPLORATORY LAPAROTOMY; PARASTOMAL HERNIA REPAIR Sugerbaker technique  incisional hernia repair, right sided TAR;  Surgeon: Felicie Morn, MD;  Location: [facility];  Service: General;  Laterality: N/A;  NASAL SINUS SURGERY     PORTA CATH INSERTION N/A 04/23/2018   Procedure: PORTA CATH INSERTION;  Surgeon: Algernon Huxley, MD;  Location: [place] [facility];  Service: Cardiovascular;  Laterality: N/A;   PORTA CATH REMOVAL  N/A 09/04/2018   Procedure: PORTA CATH REMOVAL;  Surgeon: Katha Cabal, MD;  Location: [place] [facility];  Service: Cardiovascular;  Laterality: N/A;   TEE WITHOUT CARDIOVERSION N/A 09/05/2018   Procedure: TRANSESOPHAGEAL ECHOCARDIOGRAM (TEE);  Surgeon: Minna Merritts, MD;  Location: [facility];  Service: Cardiovascular;  Laterality: N/A;   TRANSURETHRAL RESECTION OF PROSTATE N/A 04/04/2018   Procedure: TRANSURETHRAL RESECTION OF THE PROSTATE (TURP);  Surgeon: Hollice Espy, MD;  Location: [facility];  Service: Urology;  Laterality: N/A;   URETERAL BIOPSY Right 04/04/2018   Procedure: URETERAL BIOPSY;  Surgeon: Hollice Espy, MD;  Location: [facility];  Service: Urology;  Laterality: Right;     SOCIAL HISTORY:  Social History   Socioeconomic History   Marital status: Widowed    Spouse name: Not on file   Number of children: Not on file   Years of education: Not on file   Highest education level: Not on file  Occupational History   Not on file  Tobacco Use   Smoking status: Former    Packs/day: 1.50    Years: 30.00    Total pack years: 45.00    Types: Cigarettes    Quit date: 2009    Years since quitting: 14.8   Smokeless tobacco: Former  Scientific laboratory technician Use: Never used  Substance and Sexual Activity   Alcohol use: Yes    Alcohol/week: 7.0 standard drinks of alcohol    Types: 5 Cans of beer, 2 Standard drinks or equivalent per week    Comment: ocassional    Drug use: Not Currently   Sexual activity: Not Currently  Other Topics Concern   Not on file  Social History Narrative   currently retired.  Works part-time.  Remote history of smoking.  No alcohol.   Social Determinants of Health   Financial Resource Strain: Low Risk  (08/09/2018)   Overall Financial Resource Strain (CARDIA)    Difficulty of Paying Living Expenses: Not hard at all  Food Insecurity: Unknown (08/09/2018)   Hunger Vital Sign    Worried About Running Out of Food in the Last Year: Patient  refused    [place] in the Last Year: Patient refused  Transportation Needs: Unknown (08/09/2018)   [place] - Hydrologist (Medical): Patient refused    Lack of Transportation (Non-Medical): Patient refused  Physical Activity: Unknown (08/09/2018)   Exercise Vital Sign    Days of Exercise per Week: Patient refused    Minutes of Exercise per Session: Patient refused  Stress: No Stress Concern Present (08/09/2018)   [employer] [place] of Stress : Not at all  Social Connections: Unknown (08/09/2018)   Social Connection and Isolation Panel [NHANES]    Frequency of Communication with Friends and Family: Patient refused    Frequency of Social Gatherings with Friends and Family: Patient refused    Attends Religious Services: Patient refused    Active Member of Clubs or Organizations: Patient refused    Attends Archivist Meetings: Patient refused    Marital Status: Patient refused  Intimate Partner Violence: Unknown (08/09/2018)   Humiliation, Afraid, Rape, and Kick questionnaire  Fear of Current or Ex-Partner: Patient refused    Emotionally Abused: Patient refused    Physically Abused: Patient refused    Sexually Abused: Patient refused    FAMILY HISTORY: Family History  Problem Relation Age of Onset   Diabetes Mother    Heart disease Father    Diabetes Brother    Heart attack Brother    Heart disease Brother     ALLERGIES:  is allergic to contrast media [iodinated contrast media].  MEDICATIONS:  Current Outpatient Medications  Medication Sig Dispense Refill   acetaminophen (TYLENOL) 325 MG tablet Take 2 tablets (650 mg total) by mouth every 6 (six) hours.     amLODipine (NORVASC) 10 MG tablet TAKE 1 TABLET(10 MG) BY MOUTH EVERY DAY     aspirin EC 81 MG tablet Take 81 mg by mouth daily.     atorvastatin (LIPITOR) 20 MG tablet Take 20 mg by mouth daily.      azelastine (ASTELIN) 0.1 % nasal spray Place 1 spray into both nostrils 2 (two) times daily.     fluticasone-salmeterol (ADVAIR) 250-50 MCG/ACT AEPB Inhale 1 puff into the lungs 2 (two) times daily.     levocetirizine (XYZAL) 5 MG tablet Take by mouth.     metoprolol tartrate (LOPRESSOR) 25 MG tablet TAKE 1 TABLET(25 MG) BY MOUTH TWICE DAILY 180 tablet 1   Multiple Vitamins-Minerals (MULTIVITAMIN WITH MINERALS) tablet Take 1 tablet by mouth daily.     pantoprazole (PROTONIX) 40 MG tablet Take 40 mg by mouth every evening.      tiotropium (SPIRIVA HANDIHALER) 18 MCG inhalation capsule Place 18 mcg into inhaler and inhale daily.     methocarbamol (ROBAXIN) 500 MG tablet Take 1 tablet (500 mg total) by mouth every 6 (six) hours as needed for muscle spasms. (Patient not taking: Reported on 05/11/2022) 40 tablet 1   No current facility-administered medications for this visit.      Marland Kitchen  PHYSICAL EXAMINATION: ECOG PERFORMANCE STATUS: 1 - Symptomatic but completely ambulatory  Vitals:   05/11/22 1353  BP: 132/81  Pulse: 86  Resp: 16  Temp: (!) 96.3 F (35.7 C)  SpO2: 98%   Filed Weights   05/11/22 1353  Weight: 223 lb (101.2 kg)   Left submandibular nodule/lymph node.  About 2 cm in size.  Nontender.   Physical Exam Constitutional:      Comments: He is alone.  Walking himself.  HENT:     Head: Normocephalic and atraumatic.     Mouth/Throat:     Pharynx: No oropharyngeal exudate.  Eyes:     Pupils: Pupils are equal, round, and reactive to light.  Cardiovascular:     Rate and Rhythm: Normal rate and regular rhythm.  Pulmonary:     Effort: No respiratory distress.     Breath sounds: Normal breath sounds.  Abdominal:     General: Bowel sounds are normal. There is no distension.     Palpations: Abdomen is soft. There is no mass.     Tenderness: There is no abdominal tenderness. There is no guarding or rebound.     Comments: Urostomy.   Musculoskeletal:        General: No  tenderness. Normal range of motion.     Cervical back: Normal range of motion and neck supple.  Skin:    General: Skin is warm.  Neurological:     Mental Status: He is alert and oriented to person, place, and time.  Psychiatric:  Mood and Affect: Affect normal.      LABORATORY DATA:  I have reviewed the data as listed Lab Results  Component Value Date   WBC 5.6 04/26/2022   HGB 14.0 04/26/2022   HCT 41.4 04/26/2022   MCV 90.4 04/26/2022   PLT 194 04/26/2022   Recent Labs    09/24/21 1035 04/26/22 0813  NA 136 140  K 4.4 4.4  CL 102 108  CO2 27 26  GLUCOSE 99 108*  BUN 17 21  CREATININE 1.31* 1.59*  CALCIUM 9.1 9.0  GFRNONAA 59* 46*  PROT 7.2 7.3  ALBUMIN 4.1 3.8  AST 27 20  ALT 27 16  ALKPHOS 74 72  BILITOT 1.6* 1.1    RADIOGRAPHIC STUDIES: I have personally reviewed the radiological images as listed and agreed with the findings in the report. [name] CORE BIOPSY (LYMPH NODES)  Result Date: 05/05/2022 INDICATION: Lymphadenopathy; left neck- Hx of bladder cancer EXAM: Ultrasound-guided core needle biopsy of cervical lymph node MEDICATIONS: None. ANESTHESIA/SEDATION: Local analgesia COMPLICATIONS: None immediate. PROCEDURE: Informed written consent was obtained from the patient after a thorough discussion of the procedural risks, benefits and alternatives. All questions were addressed. Maximal Sterile Barrier Technique was utilized including caps, mask, sterile gowns, sterile gloves, sterile drape, hand hygiene and skin antiseptic. A timeout was performed prior to the initiation of the procedure. The patient was placed supine on the exam table. Ultrasound of the left neck demonstrated enlarged abnormal lymph node. Skin entry site was marked, and the overlying skin was prepped draped in the standard sterile fashion. Local analgesia was obtained with 1% lidocaine. Using ultrasound guidance, core needle biopsy was performed of the abnormal lymph node in the left neck using  an 18 gauge core biopsy device x6 total passes. Specimens were submitted in saline to pathology for further handling. Limited postprocedure imaging demonstrated no hematoma. A clean dressing was placed after manual hemostasis. The patient tolerated the procedure well without immediate complication. IMPRESSION: Successful ultrasound-guided core needle biopsy of enlarged left cervical lymph node. Electronically Signed   By: Albin Felling M.D.   On: 05/05/2022 15:23   US SOFT TISSUE HEAD & NECK (NON-THYROID)  Result Date: 04/28/2022 CLINICAL DATA:  swelling-leck neck; Hx of bladder cancer EXAM: ULTRASOUND OF HEAD/NECK SOFT TISSUES TECHNIQUE: Ultrasound examination of the head and neck soft tissues was performed in the area of clinical concern. COMPARISON:  None Available. FINDINGS: Focused sonographic exam of the left neck area of interest was performed. This demonstrates an enlarged hypoechoic structure measuring 3.0 x 2.0 x 1.8 cm, just lateral to the submandibular gland and inferior to the parotid gland. IMPRESSION: Suspected abnormal and enlarged lymph node in the left neck. This finding is amenable to percutaneous biopsy if indicated. Electronically Signed   By: Albin Felling M.D.   On: 04/28/2022 15:58   NM PET Image Initial (PI) Skull Base To Thigh  Result Date: 04/22/2022 CLINICAL DATA:  Subsequent treatment strategy for bladder cancer. EXAM: NUCLEAR MEDICINE PET SKULL BASE TO THIGH TECHNIQUE: 11.89 mCi F-18 FDG was injected intravenously. Full-ring PET imaging was performed from the skull base to thigh after the radiotracer. CT data was obtained and used for attenuation correction and anatomic localization. Fasting blood glucose: 101 mg/dl COMPARISON:  CT AP 03/23/2022 FINDINGS: Mediastinal blood pool activity: SUV max 2.14 Liver activity: SUV max NA NECK: There is a FDG avid mass within the left side of neck just anterior and inferior to the left parotid gland and lateral to the  left submandibular  gland. This measures 2.4 x 1.7 cm with SUV max of 7.72, image 41/2. Incidental CT findings: None. CHEST: No tracer avid axillary, supraclavicular, mediastinal, or hilar lymph nodes. No tracer avid pulmonary nodule or mass. Incidental CT findings: Aortic atherosclerosis and coronary artery calcifications. ABDOMEN/PELVIS: There is no abnormal tracer uptake within the liver, pancreas, spleen or adrenal glands. No tracer avid pulmonary non scratch set no tracer avid adenopathy within the abdomen or pelvis. Status post cystoprostatectomy. No signs of locally recurrent tracer avid disease within the pelvis. Again seen is an area of soft tissue stranding adjacent to a loop of small bowel just proximal to the diverting loop ileostomy. No abnormal tracer uptake is identified within the area of soft tissue stranding which is favored to represent an area of fat necrosis. Incidental CT findings: Postoperative changes from previous cystoprostatectomy with ileal conduit urinary diversion. There is a parastomal hernia at the ostomy site containing nonobstructed loops of small bowel. Aortic atherosclerotic calcifications. Right hydrocele SKELETON: No focal hypermetabolic activity to suggest skeletal metastasis. Incidental CT findings: None. IMPRESSION: 1. Status post cystoprostatectomy with ileal conduit urinary diversion. No signs of FDG avid locally recurrent tumor. No evidence for tracer avid abdominopelvic adenopathy or solid organ metastasis. 2. There is an indeterminate FDG avid mass within the left side of neck just anterior and inferior to the left parotid gland and lateral to the left submandibular gland. This would be an unusual location for nodal metastasis in the absence of recurrent/metastatic disease within the chest, abdomen or pelvis. Primary head neck neoplasm cannot be excluded. Recommend more definitive characterization with soft tissue neck ultrasound over the area of concern within the left submandibular region  with possible percutaneous image guided needle biopsy for more definitive diagnosis. 3.  Aortic Atherosclerosis (ICD10-I70.0). Electronically Signed   By: Kerby Moors M.D.   On: 04/22/2022 17:35    ASSESSMENT & PLAN:   Cancer of bladder neck ([place]) #Transitional cell bladder cancer neo-adjuvant chemo-s/p 4 cycles of dose dense MVAC; status post cystectomy [JAN 2021]. SEP 2023: New ill-defined focal soft tissue located adjacent to the wall of a distal small bowel loop with surrounding fat stranding. PET scan OCT 2023-no evidence of metastatic disease noted. STABLE.  #Left submandibular nodule-PET avid-s/p ultrasound-guided biopsy-benign adenoid tumor-  Recommend/awaiting ENT evaluation re: Excision s/p referral.  Awaiting appointment.  # Chronic kidney disease-stage III- GFR- 48- STABLE [Dr.M; [facility]]  # HNT-on metoprolol.  Recommend follow up PCP re: further refills.  # DISPOSITION:  # follow up in 6 months/labs-MD;  cbc/cmp;-Dr.B         All questions were answered. The patient knows to call the clinic with any problems, questions or concerns.     Cammie Sickle, MD 05/11/2022 11:06 PM

## 2022-05-11 NOTE — Assessment & Plan Note (Addendum)
#  Transitional cell bladder cancer neo-adjuvant chemo-s/p 4 cycles of dose dense MVAC; status post cystectomy [JAN 2021]. SEP 2023: New ill-defined focal soft tissue located adjacent to the wall of a distal small bowel loop with surrounding fat stranding. PET scan OCT 2023-no evidence of metastatic disease noted. STABLE.  #Left submandibular nodule-PET avid-s/p ultrasound-guided biopsy-benign adenoid tumor-  Recommend/awaiting ENT evaluation re: Excision s/p referral.  Awaiting appointment.  # Chronic kidney disease-stage III- GFR- 48- STABLE [Dr.M; [facility]]  # HNT-on metoprolol.  Recommend follow up PCP re: further refills.  # DISPOSITION:  # follow up in 6 months/labs-MD;  cbc/cmp;-Dr.B

## 2022-05-11 NOTE — Progress Notes (Signed)
Pt in for follow up and biopsy results.  

## 2022-05-17 ENCOUNTER — Ambulatory Visit (HOSPITAL_COMMUNITY): Payer: PPO

## 2022-05-20 ENCOUNTER — Telehealth: Payer: Self-pay | Admitting: *Deleted

## 2022-05-20 DIAGNOSIS — R221 Localized swelling, mass and lump, neck: Secondary | ICD-10-CM

## 2022-05-20 DIAGNOSIS — C675 Malignant neoplasm of bladder neck: Secondary | ICD-10-CM

## 2022-05-20 NOTE — Telephone Encounter (Signed)
Patient called stating that he was to have been referred to to someone for biopsy of a lesion on his face but he has not heard anything about an appointment I see in Dr B note that he was being referred to ENT. Please follow up on this.  ASSESSMENT & PLAN:    Cancer of bladder neck ([place]) #Transitional cell bladder cancer neo-adjuvant chemo-s/p 4 cycles of dose dense MVAC; status post cystectomy [JAN 2021]. SEP 2023: New ill-defined focal soft tissue located adjacent to the wall of a distal small bowel loop with surrounding fat stranding. PET scan OCT 2023-no evidence of metastatic disease noted. STABLE.   #Left submandibular nodule-PET avid-s/p ultrasound-guided biopsy-benign adenoid tumor-  Recommend/awaiting ENT evaluation re: Excision s/p referral.  Awaiting appointment.   # Chronic kidney disease-stage III- GFR- 48- STABLE [Dr.M; [facility]]   # HNT-on metoprolol.  Recommend follow up PCP re: further refills.   # DISPOSITION:   # follow up in 6 months/labs-MD;  cbc/cmp;-Dr.B                 All questions were answered. The patient knows to call the clinic with any problems, questions or concerns.      Cammie Sickle, MD 05/11/2022 11:06 PM

## 2022-05-20 NOTE — Telephone Encounter (Signed)
Referral faxed to [facility] 

## 2022-05-23 NOTE — Telephone Encounter (Signed)
Appointment has been scheduled with Dr. Richardson Landry on 06/01/22 @ 2:50.

## 2022-06-01 DIAGNOSIS — D117 Benign neoplasm of other major salivary glands: Secondary | ICD-10-CM | POA: Diagnosis not present

## 2022-06-02 ENCOUNTER — Other Ambulatory Visit: Payer: Self-pay | Admitting: Otolaryngology

## 2022-06-02 DIAGNOSIS — R221 Localized swelling, mass and lump, neck: Secondary | ICD-10-CM

## 2022-06-07 ENCOUNTER — Ambulatory Visit
Admission: RE | Admit: 2022-06-07 | Discharge: 2022-06-07 | Disposition: A | Discharge status: Home / Self Care | Payer: PPO | Source: Ambulatory Visit | Attending: Otolaryngology | Admitting: Otolaryngology

## 2022-06-07 DIAGNOSIS — Z8551 Personal history of malignant neoplasm of bladder: Secondary | ICD-10-CM | POA: Diagnosis not present

## 2022-06-07 DIAGNOSIS — Z8546 Personal history of malignant neoplasm of prostate: Secondary | ICD-10-CM | POA: Diagnosis not present

## 2022-06-07 DIAGNOSIS — R221 Localized swelling, mass and lump, neck: Secondary | ICD-10-CM

## 2022-06-07 DIAGNOSIS — I6529 Occlusion and stenosis of unspecified carotid artery: Secondary | ICD-10-CM | POA: Diagnosis not present

## 2022-06-07 DIAGNOSIS — K119 Disease of salivary gland, unspecified: Secondary | ICD-10-CM | POA: Diagnosis not present

## 2022-06-15 ENCOUNTER — Other Ambulatory Visit: Payer: PPO

## 2022-06-16 ENCOUNTER — Other Ambulatory Visit: Payer: Self-pay

## 2022-06-16 ENCOUNTER — Encounter
Admission: RE | Admit: 2022-06-16 | Discharge: 2022-06-16 | Disposition: A | Discharge status: Home / Self Care | Payer: PPO | Source: Ambulatory Visit | Attending: Otolaryngology | Admitting: Otolaryngology

## 2022-06-16 DIAGNOSIS — I10 Essential (primary) hypertension: Secondary | ICD-10-CM

## 2022-06-16 DIAGNOSIS — Z01812 Encounter for preprocedural laboratory examination: Secondary | ICD-10-CM

## 2022-06-16 HISTORY — DX: Personal history of urinary calculi: Z87.442

## 2022-06-16 HISTORY — DX: Prediabetes: R73.03

## 2022-06-16 HISTORY — DX: Unspecified osteoarthritis, unspecified site: M19.90

## 2022-06-16 HISTORY — DX: Other complications of anesthesia, initial encounter: T88.59XA

## 2022-06-16 HISTORY — DX: Chronic kidney disease, unspecified: N18.9

## 2022-06-16 NOTE — Patient Instructions (Addendum)
Your procedure is scheduled on: 06/22/22 - Wednesday Report to the Registration Desk on the 1st floor of the [facility]. To find out your arrival time, please call [phone] between 1PM - 3PM on: 06/21/22 - Tuesday If your arrival time is 6:00 am, do not arrive prior to that time as the [facility] [place] entrance doors do not open until 6:00 am.  REMEMBER: Instructions that are not followed completely may result in serious medical risk, up to and including death; or upon the discretion of your surgeon and anesthesiologist your surgery may need to be rescheduled.  Do not eat food or drink any liquids after midnight the night before surgery.  No gum chewing, lozengers or hard candies.   TAKE THESE MEDICATIONS THE MORNING OF SURGERY WITH A SIP OF WATER:  - pantoprazole (PROTONIX) 40 MG tablet (take one the night before and one on the morning of surgery - helps to prevent nausea after surgery.) - metoprolol tartrate (LOPRESSOR)  - fluticasone-salmeterol (ADVAIR)  - atorvastatin (LIPITOR)  - amLODipine (NORVASC)  - tiotropium (SPIRIVA HANDIHALER)    One week prior to surgery: Stop Anti-inflammatories (NSAIDS) such as Advil, Aleve, Ibuprofen, Motrin, Naproxen, Naprosyn and Aspirin based products such as Excedrin, Goodys Powder, BC Powder.  Stop ANY OVER THE COUNTER supplements until after surgery.  You may however, continue to take Tylenol if needed for pain up until the day of surgery.  No Alcohol for 24 hours before or after surgery.  No Smoking including e-cigarettes for 24 hours prior to surgery.  No chewable tobacco products for at least 6 hours prior to surgery.  No nicotine patches on the day of surgery.  Do not use any "recreational" drugs for at least a week prior to your surgery.  Please be advised that the combination of cocaine and anesthesia may have negative outcomes, up to and including death. If you test positive for cocaine, your surgery will be cancelled.  On the  morning of surgery brush your teeth with toothpaste and water, you may rinse your mouth with mouthwash if you wish. Do not swallow any toothpaste or mouthwash.  Use CHG Soap or wipes as directed on instruction sheet.  Do not wear jewelry, make-up, hairpins, clips or nail polish.  Do not wear lotions, powders, or perfumes.   Do not shave body from the neck down 48 hours prior to surgery just in case you cut yourself which could leave a site for infection. Also, freshly shaved skin may become irritated if using the CHG soap.  Contact lenses, hearing aids and dentures may not be worn into surgery.  Do not bring valuables to the hospital. [facility] is not responsible for any missing/lost belongings or valuables.   Notify your doctor if there is any change in your medical condition (cold, fever, infection).  Wear comfortable clothing (specific to your surgery type) to the hospital.  After surgery, you can help prevent lung complications by doing breathing exercises.  Take deep breaths and cough every 1-2 hours. Your doctor may order a device called an Incentive Spirometer to help you take deep breaths. When coughing or sneezing, hold a pillow firmly against your incision with both hands. This is called "splinting." Doing this helps protect your incision. It also decreases belly discomfort.  If you are being admitted to the hospital overnight, leave your suitcase in the car. After surgery it may be brought to your room.  If you are being discharged the day of surgery, you will not be  allowed to drive home. You will need a responsible adult (18 years or older) to drive you home and stay with you that night.   If you are taking public transportation, you will need to have a responsible adult (18 years or older) with you. Please confirm with your physician that it is acceptable to use public transportation.   Please call the [facility] Dept. at [phone] if you have any  questions about these instructions.  Surgery Visitation Policy:  Patients undergoing a surgery or procedure may have two family members or support persons with them as long as the person is not COVID-19 positive or experiencing its symptoms.   Inpatient Visitation:    Visiting hours are 7 a.m. to 8 p.m. Up to four visitors are allowed at one time in a patient room. The visitors may rotate out with other people during the day. One designated support person (adult) may remain overnight.  MASKING: Due to an increase in RSV rates and hospitalizations, in-patient care areas in which we serve newborns, infants and children, masks will be required for teammates and visitors.  Children ages 5 and under may not visit. This policy affects the following departments only:  [place] Postpartum area Mother Baby Unit Newborn nursery/Special care nursery  Other areas: Masks continue to be strongly recommended for [place] teammates, visitors and patients in all other areas. Visitation is not restricted outside of the units listed above.

## 2022-06-17 ENCOUNTER — Encounter
Admission: RE | Admit: 2022-06-17 | Discharge: 2022-06-17 | Disposition: A | Discharge status: Home / Self Care | Payer: PPO | Source: Ambulatory Visit | Attending: Otolaryngology | Admitting: Otolaryngology

## 2022-06-17 DIAGNOSIS — Z01812 Encounter for preprocedural laboratory examination: Secondary | ICD-10-CM

## 2022-06-17 DIAGNOSIS — Z01810 Encounter for preprocedural cardiovascular examination: Secondary | ICD-10-CM | POA: Diagnosis not present

## 2022-06-17 DIAGNOSIS — I10 Essential (primary) hypertension: Secondary | ICD-10-CM | POA: Diagnosis not present

## 2022-06-19 IMAGING — US US SCROTUM W/ DOPPLER COMPLETE
1 series · 13 of 25 positions shown · non-contrast
Comparison: CT Abdomen and Pelvis 09/08/2021.

CLINICAL DATA: 70-year-old male with right testicular swelling.

EXAM:
SCROTAL ULTRASOUND
DOPPLER ULTRASOUND OF THE TESTICLES
TECHNIQUE: Complete ultrasound examination of the testicles, epididymis, and
other scrotal structures was performed. Color and spectral Doppler
ultrasound were also utilized to evaluate blood flow to the
testicles.

[Series 1: us scrotum w/doppler · 43 acquisitions, 13 frames shown]
[im 1/43]
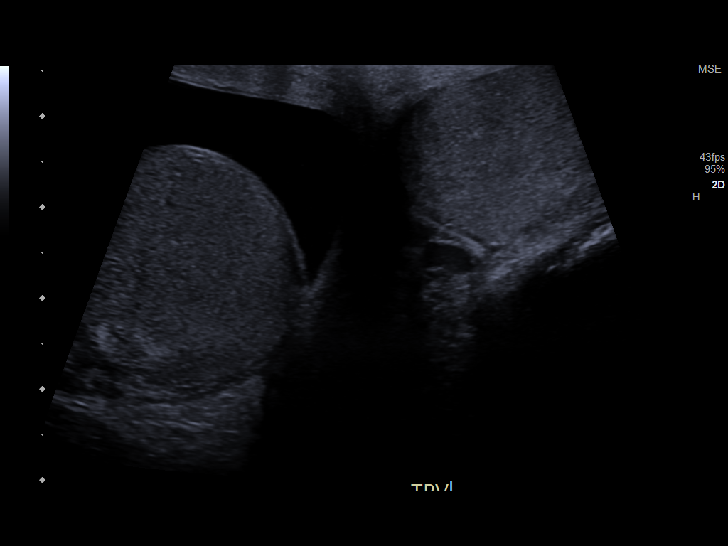
[im 4/43]
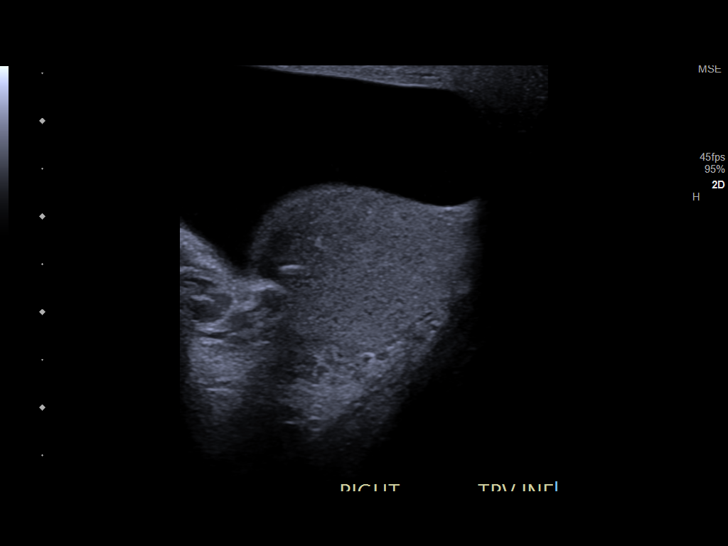
[im 8/43]
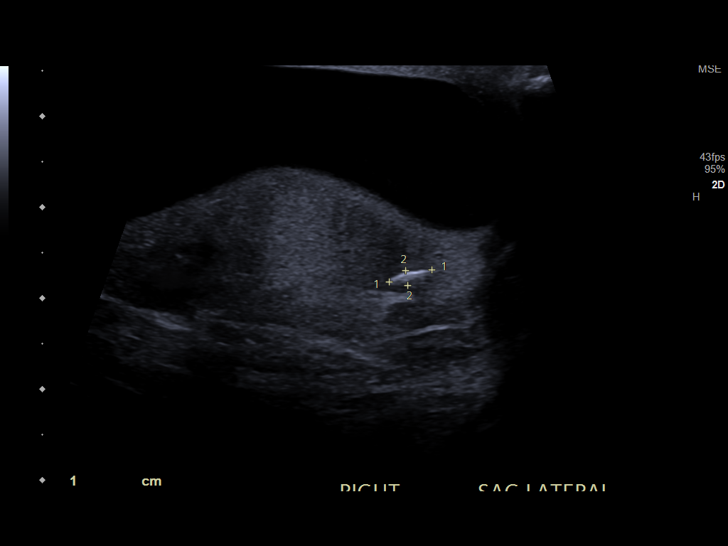
[im 11/43]
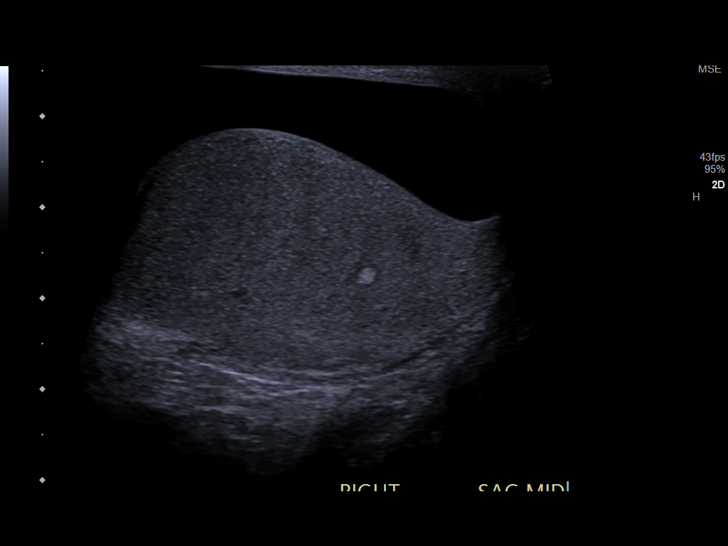
[im 15/43]
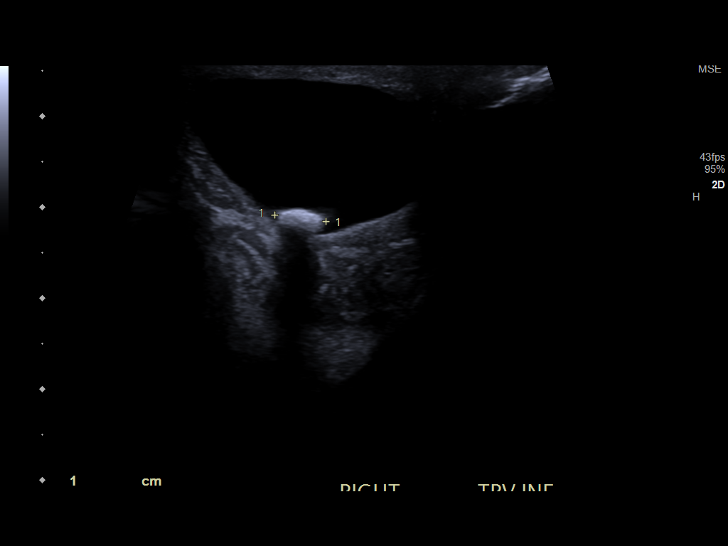
[im 18/43]
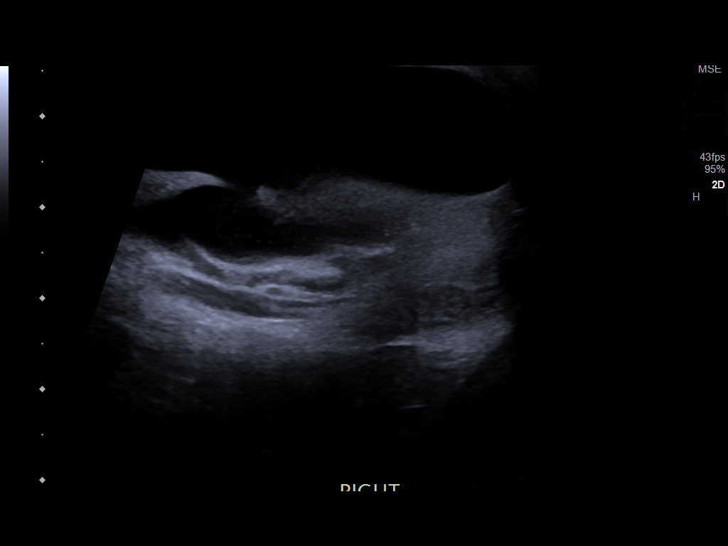
[im 22/43]
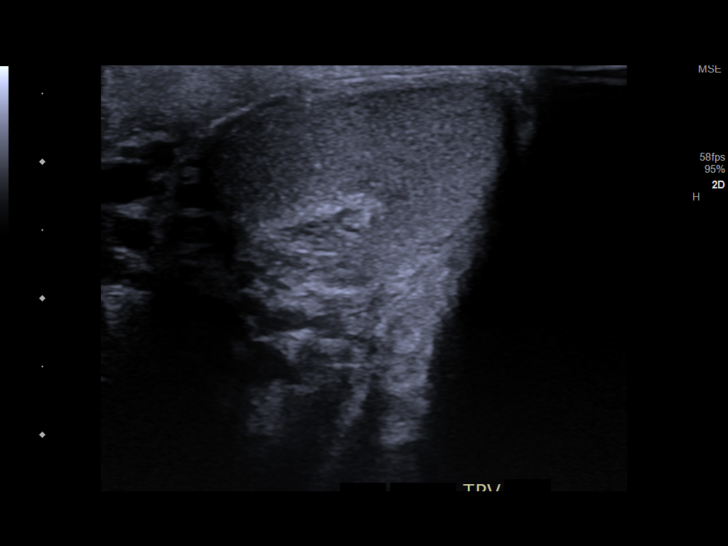
[im 25/43]
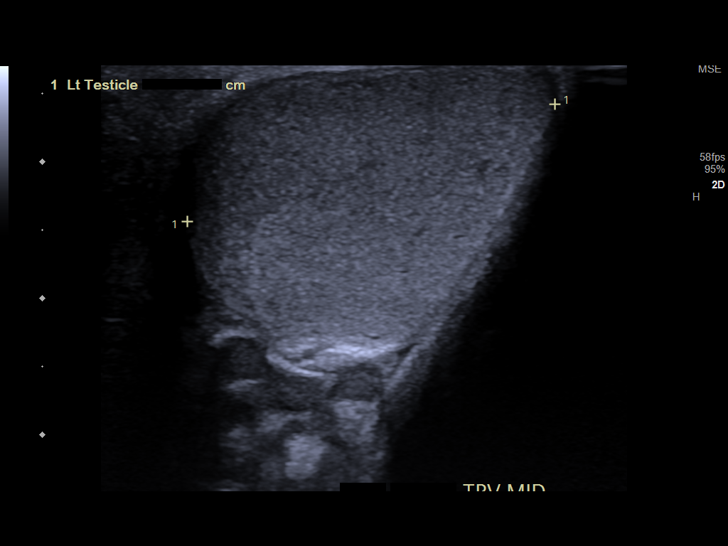
[im 29/43]
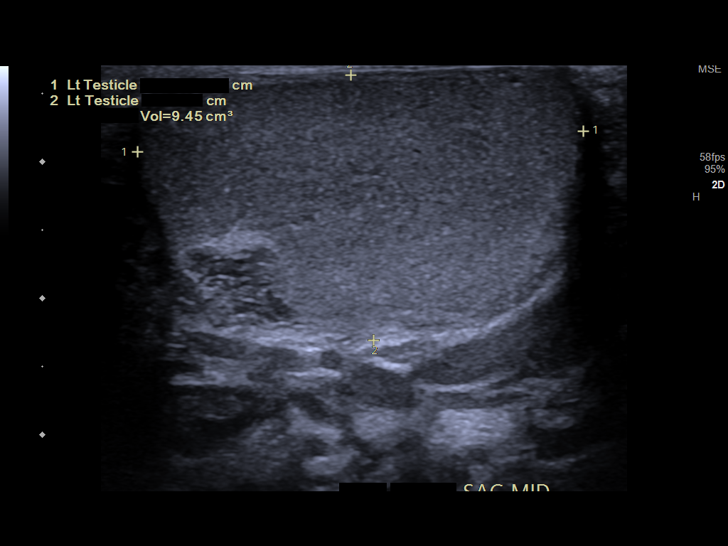
[im 32/43]
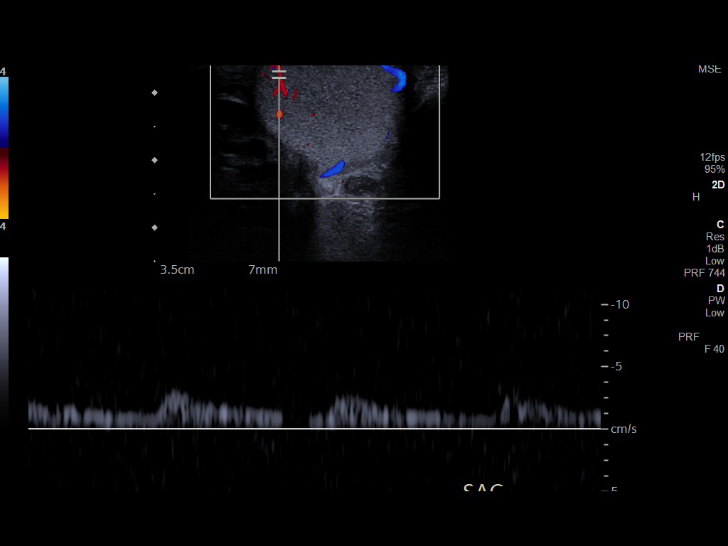
[im 36/43]
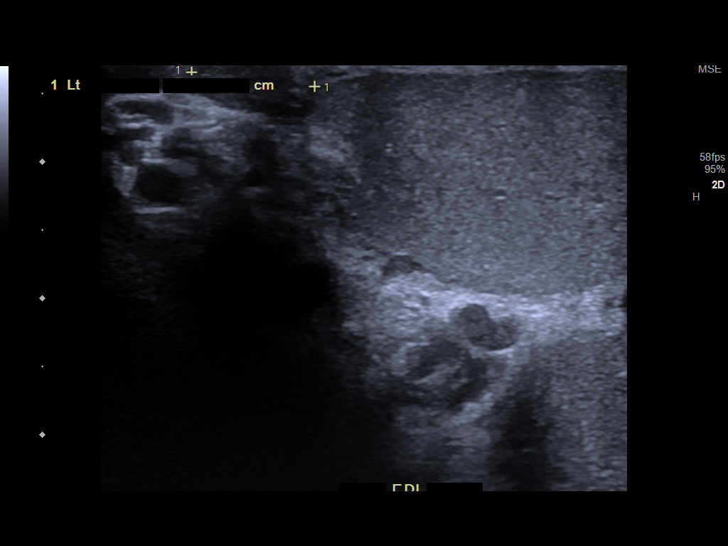
[im 39/43]
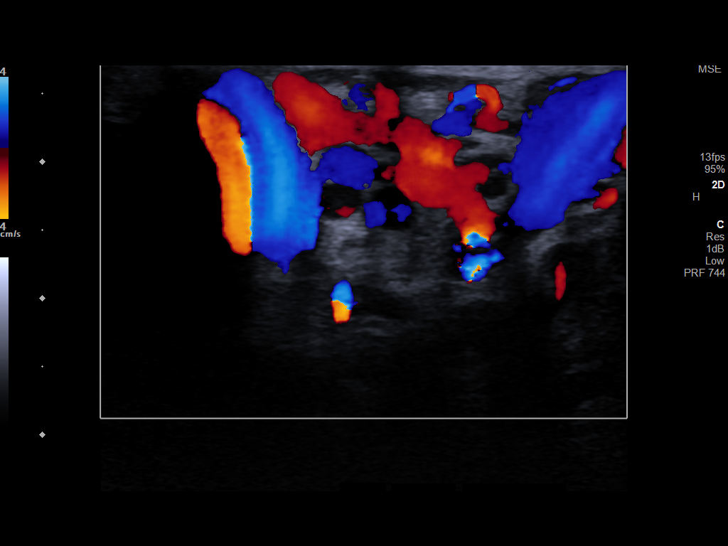
[im 43/43]
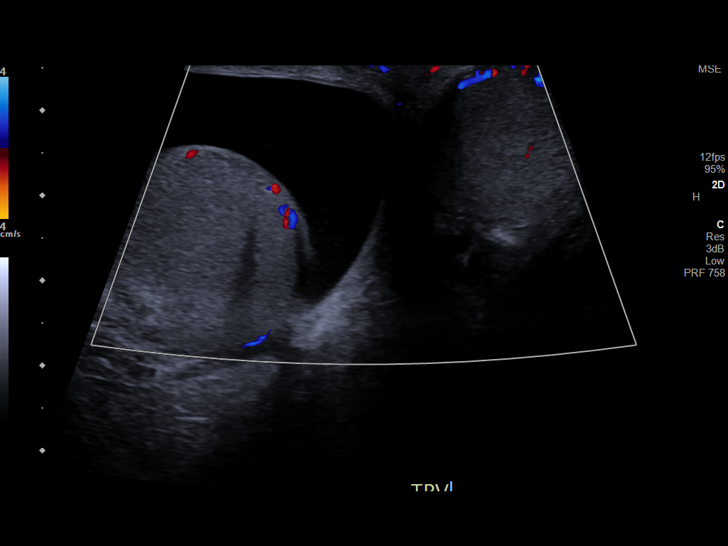

[13 of 25 positions shown; findings below may reference images not displayed]

FINDINGS: Right testicle

Measurements: 4.1 x 2.6 x 3.1 cm. No mass or microlithiasis
visualized. Several small scrotal pearls or other dystrophic
calcifications appear inconsequential (images 14, 23).

Left testicle

Measurements: 3.3 x 2.8 x 2.0 cm. No mass or microlithiasis
visualized.

Right epididymis:  Normal in size and appearance.

Left epididymis:  Normal in size and appearance.

Hydrocele: Large right scrotal hydrocele with simple appearing fluid
content (image 51).

Varicocele: Positive left side varicocele (images 46 and 47 on the
left versus image 28 on the right).

Pulsed Doppler interrogation of both testes demonstrates normal low
resistance arterial and venous waveforms bilaterally.
IMPRESSION: 1. A Large right side Hydrocele appears to be new since [DATE]. Positive also for left-sided Varicocele.
3. Negative for testicular mass or torsion.

## 2022-06-21 MED ORDER — ORAL CARE MOUTH RINSE: 15.0000 mL | Freq: Once | OROMUCOSAL | Status: AC

## 2022-06-21 MED ORDER — CHLORHEXIDINE GLUCONATE 0.12 % MT SOLN: 15.0000 mL | Freq: Once | OROMUCOSAL | Status: AC

## 2022-06-21 MED ORDER — LACTATED RINGERS IV SOLN: INTRAVENOUS | Status: DC

## 2022-06-22 ENCOUNTER — Ambulatory Visit: Payer: PPO | Admitting: Anesthesiology

## 2022-06-22 ENCOUNTER — Encounter
Admission: RE | Disposition: A | Discharge status: Home / Self Care | Payer: Self-pay | Source: Ambulatory Visit | Attending: Otolaryngology

## 2022-06-22 ENCOUNTER — Ambulatory Visit
Admission: RE | Admit: 2022-06-22 | Discharge: 2022-06-22 | Disposition: A | Discharge status: Home / Self Care | Payer: PPO | Source: Ambulatory Visit | Attending: Otolaryngology | Admitting: Otolaryngology

## 2022-06-22 ENCOUNTER — Other Ambulatory Visit: Payer: Self-pay

## 2022-06-22 ENCOUNTER — Encounter: Payer: Self-pay | Admitting: Otolaryngology

## 2022-06-22 DIAGNOSIS — D117 Benign neoplasm of other major salivary glands: Secondary | ICD-10-CM | POA: Insufficient documentation

## 2022-06-22 DIAGNOSIS — Z79891 Long term (current) use of opiate analgesic: Secondary | ICD-10-CM | POA: Insufficient documentation

## 2022-06-22 DIAGNOSIS — N189 Chronic kidney disease, unspecified: Secondary | ICD-10-CM | POA: Diagnosis not present

## 2022-06-22 DIAGNOSIS — I129 Hypertensive chronic kidney disease with stage 1 through stage 4 chronic kidney disease, or unspecified chronic kidney disease: Secondary | ICD-10-CM | POA: Insufficient documentation

## 2022-06-22 DIAGNOSIS — D119 Benign neoplasm of major salivary gland, unspecified: Secondary | ICD-10-CM | POA: Diagnosis not present

## 2022-06-22 DIAGNOSIS — N1831 Chronic kidney disease, stage 3a: Secondary | ICD-10-CM | POA: Diagnosis not present

## 2022-06-22 DIAGNOSIS — Z87891 Personal history of nicotine dependence: Secondary | ICD-10-CM | POA: Diagnosis not present

## 2022-06-22 HISTORY — PX: SUBMANDIBULAR GLAND EXCISION: SHX2456

## 2022-06-22 SURGERY — EXCISION, SUBMANDIBULAR GLAND
Anesthesia: General | Laterality: Left

## 2022-06-22 MED ORDER — EPHEDRINE SULFATE (PRESSORS) 50 MG/ML IJ SOLN
INTRAMUSCULAR | Status: DC | PRN
  Administered 2022-06-22: 5 mg via INTRAVENOUS

## 2022-06-22 MED ORDER — PHENYLEPHRINE HCL-NACL 20-0.9 MG/250ML-% IV SOLN
INTRAVENOUS | Status: DC | PRN
  Administered 2022-06-22: 40 ug/min via INTRAVENOUS

## 2022-06-22 MED ORDER — PROPOFOL 1000 MG/100ML IV EMUL
INTRAVENOUS | Status: AC
  Filled 2022-06-22: qty 100

## 2022-06-22 MED ORDER — PROPOFOL 10 MG/ML IV BOLUS
INTRAVENOUS | Status: DC | PRN
  Administered 2022-06-22: 200 mg via INTRAVENOUS

## 2022-06-22 MED ORDER — DEXAMETHASONE SODIUM PHOSPHATE 10 MG/ML IJ SOLN
INTRAMUSCULAR | Status: DC | PRN
  Administered 2022-06-22: 8 mg via INTRAVENOUS

## 2022-06-22 MED ORDER — ONDANSETRON HCL 4 MG/2ML IJ SOLN
INTRAMUSCULAR | Status: AC
  Filled 2022-06-22: qty 2

## 2022-06-22 MED ORDER — FENTANYL CITRATE (PF) 100 MCG/2ML IJ SOLN
INTRAMUSCULAR | Status: AC
  Filled 2022-06-22: qty 2

## 2022-06-22 MED ORDER — HYDROCODONE-ACETAMINOPHEN 5-325 MG PO TABS: 1.0000 | ORAL_TABLET | Freq: Four times a day (QID) | ORAL | 0 refills | Status: DC | PRN

## 2022-06-22 MED ORDER — VASOPRESSIN 20 UNIT/ML IV SOLN
INTRAVENOUS | Status: DC | PRN
  Administered 2022-06-22 (×3): 1 [IU] via INTRAVENOUS
  Administered 2022-06-22: .5 [IU] via INTRAVENOUS

## 2022-06-22 MED ORDER — BACITRACIN 500 UNIT/GM EX OINT
TOPICAL_OINTMENT | CUTANEOUS | Status: DC | PRN
  Administered 2022-06-22: 1 via TOPICAL

## 2022-06-22 MED ORDER — DEXAMETHASONE SODIUM PHOSPHATE 10 MG/ML IJ SOLN
INTRAMUSCULAR | Status: AC
  Filled 2022-06-22: qty 1

## 2022-06-22 MED ORDER — PHENYLEPHRINE 80 MCG/ML (10ML) SYRINGE FOR IV PUSH (FOR BLOOD PRESSURE SUPPORT)
PREFILLED_SYRINGE | INTRAVENOUS | Status: AC
  Filled 2022-06-22: qty 10

## 2022-06-22 MED ORDER — PROPOFOL 10 MG/ML IV BOLUS
INTRAVENOUS | Status: AC
  Filled 2022-06-22: qty 40

## 2022-06-22 MED ORDER — PHENYLEPHRINE 80 MCG/ML (10ML) SYRINGE FOR IV PUSH (FOR BLOOD PRESSURE SUPPORT)
PREFILLED_SYRINGE | INTRAVENOUS | Status: DC | PRN
  Administered 2022-06-22: 80 ug via INTRAVENOUS

## 2022-06-22 MED ORDER — OXYCODONE HCL 5 MG PO TABS: 5.0000 mg | ORAL_TABLET | Freq: Once | ORAL | Status: DC | PRN

## 2022-06-22 MED ORDER — SUCCINYLCHOLINE CHLORIDE 200 MG/10ML IV SOSY
PREFILLED_SYRINGE | INTRAVENOUS | Status: AC
  Filled 2022-06-22: qty 10

## 2022-06-22 MED ORDER — SUCCINYLCHOLINE CHLORIDE 200 MG/10ML IV SOSY
PREFILLED_SYRINGE | INTRAVENOUS | Status: DC | PRN
  Administered 2022-06-22: 100 mg via INTRAVENOUS

## 2022-06-22 MED ORDER — PROPOFOL 500 MG/50ML IV EMUL
INTRAVENOUS | Status: DC | PRN
  Administered 2022-06-22: 75 ug/kg/min via INTRAVENOUS

## 2022-06-22 MED ORDER — VASOPRESSIN 20 UNIT/ML IV SOLN
INTRAVENOUS | Status: AC
  Filled 2022-06-22: qty 1

## 2022-06-22 MED ORDER — LIDOCAINE-EPINEPHRINE (PF) 1 %-1:200000 IJ SOLN
INTRAMUSCULAR | Status: AC
  Filled 2022-06-22: qty 30

## 2022-06-22 MED ORDER — FENTANYL CITRATE (PF) 100 MCG/2ML IJ SOLN
INTRAMUSCULAR | Status: DC | PRN
  Administered 2022-06-22 (×2): 50 ug via INTRAVENOUS

## 2022-06-22 MED ORDER — 0.9 % SODIUM CHLORIDE (POUR BTL) OPTIME
TOPICAL | Status: DC | PRN
  Administered 2022-06-22: 500 mL

## 2022-06-22 MED ORDER — CHLORHEXIDINE GLUCONATE 0.12 % MT SOLN
OROMUCOSAL | Status: AC
  Administered 2022-06-22: 15 mL via OROMUCOSAL
  Filled 2022-06-22: qty 15

## 2022-06-22 MED ORDER — BACITRACIN ZINC 500 UNIT/GM EX OINT
TOPICAL_OINTMENT | CUTANEOUS | Status: AC
  Filled 2022-06-22: qty 28.35

## 2022-06-22 MED ORDER — ONDANSETRON HCL 4 MG/2ML IJ SOLN
INTRAMUSCULAR | Status: DC | PRN
  Administered 2022-06-22: 4 mg via INTRAVENOUS

## 2022-06-22 MED ORDER — LIDOCAINE HCL (CARDIAC) PF 100 MG/5ML IV SOSY
PREFILLED_SYRINGE | INTRAVENOUS | Status: DC | PRN
  Administered 2022-06-22: 80 mg via INTRAVENOUS

## 2022-06-22 MED ORDER — OXYCODONE HCL 5 MG/5ML PO SOLN: 5.0000 mg | Freq: Once | ORAL | Status: DC | PRN

## 2022-06-22 MED ORDER — MIDAZOLAM HCL 2 MG/2ML IJ SOLN
INTRAMUSCULAR | Status: DC | PRN
  Administered 2022-06-22: 2 mg via INTRAVENOUS

## 2022-06-22 MED ORDER — FENTANYL CITRATE (PF) 100 MCG/2ML IJ SOLN: 25.0000 ug | INTRAMUSCULAR | Status: DC | PRN

## 2022-06-22 MED ORDER — EPHEDRINE 5 MG/ML INJ
INTRAVENOUS | Status: AC
  Filled 2022-06-22: qty 5

## 2022-06-22 MED ORDER — ROCURONIUM BROMIDE 10 MG/ML (PF) SYRINGE
PREFILLED_SYRINGE | INTRAVENOUS | Status: AC
  Filled 2022-06-22: qty 10

## 2022-06-22 MED ORDER — MIDAZOLAM HCL 2 MG/2ML IJ SOLN
INTRAMUSCULAR | Status: AC
  Filled 2022-06-22: qty 2

## 2022-06-22 MED ORDER — LIDOCAINE-EPINEPHRINE (PF) 1 %-1:200000 IJ SOLN
INTRAMUSCULAR | Status: DC | PRN
  Administered 2022-06-22: 5 mL

## 2022-06-22 SURGICAL SUPPLY — 39 items
BLADE SURG 15 STRL LF DISP TIS (BLADE) ×1 IMPLANT
BLADE SURG 15 STRL SS (BLADE) ×1
CORD BIP STRL DISP 12FT (MISCELLANEOUS) ×1 IMPLANT
DRAPE MAG INST 16X20 L/F (DRAPES) ×1 IMPLANT
DRSG TEGADERM 2-3/8X2-3/4 SM (GAUZE/BANDAGES/DRESSINGS) ×1 IMPLANT
ELECT CAUTERY BLADE TIP 2.5 (TIP) ×1
ELECT EMG 20MM DUAL (MISCELLANEOUS) ×1
ELECT NEEDLE 20X.3 GREEN (MISCELLANEOUS)
ELECT REM PT RETURN 9FT ADLT (ELECTROSURGICAL) ×1
ELECTRODE CAUTERY BLDE TIP 2.5 (TIP) IMPLANT
ELECTRODE EMG 20MM DUAL (MISCELLANEOUS) IMPLANT
ELECTRODE NDL 20X.3 GREEN (MISCELLANEOUS) IMPLANT
ELECTRODE NEEDLE 20X.3 GREEN (MISCELLANEOUS) IMPLANT
ELECTRODE REM PT RTRN 9FT ADLT (ELECTROSURGICAL) ×1 IMPLANT
FORCEPS JEWEL BIP 4-3/4 STR (INSTRUMENTS) ×1 IMPLANT
GAUZE 4X4 16PLY ~~LOC~~+RFID DBL (SPONGE) ×2 IMPLANT
GLOVE BIO SURGEON STRL SZ7.5 (GLOVE) ×2 IMPLANT
GOWN STRL REUS W/ TWL LRG LVL3 (GOWN DISPOSABLE) ×3 IMPLANT
GOWN STRL REUS W/TWL LRG LVL3 (GOWN DISPOSABLE) ×3
HOOK STAY BLUNT/RETRACTOR 5M (MISCELLANEOUS) ×1 IMPLANT
KIT TURNOVER KIT A (KITS) ×1 IMPLANT
LABEL OR SOLS (LABEL) ×1 IMPLANT
MANIFOLD NEPTUNE II (INSTRUMENTS) ×1 IMPLANT
NS IRRIG 500ML POUR BTL (IV SOLUTION) ×1 IMPLANT
PACK HEAD/NECK (MISCELLANEOUS) ×1 IMPLANT
PROBE MONO 100X0.75 ELECT 1.9M (MISCELLANEOUS) IMPLANT
PROBE NEUROSIGN BIPOL (MISCELLANEOUS) IMPLANT
PROBE NEUROSIGN BIPOLAR (MISCELLANEOUS) ×1
SHEARS HARMONIC 9CM CVD (BLADE) ×1 IMPLANT
SOL PREP PVP 2OZ (MISCELLANEOUS) ×1
SOLUTION PREP PVP 2OZ (MISCELLANEOUS) IMPLANT
SPONGE KITTNER 5P (MISCELLANEOUS) ×1 IMPLANT
SUT PROLENE 5 0 PS 3 (SUTURE) ×1 IMPLANT
SUT SILK 2 0 (SUTURE) ×1
SUT SILK 2 0 PERMA HAND 18 BK (SUTURE) ×1 IMPLANT
SUT SILK 2-0 18XBRD TIE 12 (SUTURE) ×1 IMPLANT
SUT VIC AB 4-0 RB1 18 (SUTURE) ×1 IMPLANT
TRAP FLUID SMOKE EVACUATOR (MISCELLANEOUS) ×1 IMPLANT
WATER STERILE IRR 500ML POUR (IV SOLUTION) ×1 IMPLANT

## 2022-06-22 NOTE — Addendum Note (Signed)
Addendum  created 06/22/22 1357 by Jacqualin Combes, CRNA   LDA properties accepted

## 2022-06-22 NOTE — H&P (Signed)
History and physical reviewed and will be scanned in later. No change in medical status reported by the patient or family, appears stable for surgery. All questions regarding the procedure answered, and patient (or family if a child) expressed understanding of the procedure. ? ?Steve Gilbert S [name] ?@[email or web address]@ ?

## 2022-06-22 NOTE — Transfer of Care (Signed)
Immediate Anesthesia Transfer of Care Note  Patient: Steve Gilbert  Procedure(s) Performed: EXCISION SUBMANDIBULAR GLAND (Left)  Patient Location: PACU  Anesthesia Type:General  Level of Consciousness: drowsy  Airway & Oxygen Therapy: Patient Spontanous Breathing and Patient connected to face mask oxygen  Post-op Assessment: Report given to RN and Post -op Vital signs reviewed and stable  Post vital signs: stable  Last Vitals:  Vitals Value Taken Time  BP 113/63 06/22/22 1010  Temp    Pulse 72 06/22/22 1011  Resp 18 06/22/22 1011  SpO2 96 % 06/22/22 1011  Vitals shown include unvalidated device data.  Last Pain:  Vitals:   06/22/22 0728  TempSrc: Oral  PainSc: 0-No pain         Complications: No notable events documented.

## 2022-06-22 NOTE — Anesthesia Procedure Notes (Signed)
Procedure Name: Intubation Date/Time: 06/22/2022 8:34 AM  Performed by: Jacqualin Combes, CRNAPre-anesthesia Checklist: Patient identified, Emergency Drugs available, Suction available and Patient being monitored Patient Re-evaluated:Patient Re-evaluated prior to induction Oxygen Delivery Method: Circle system utilized Preoxygenation: Pre-oxygenation with 100% oxygen Induction Type: IV induction Ventilation: Mask ventilation without difficulty Laryngoscope Size: 4 and Mac Grade View: Grade II Tube type: Oral Number of attempts: 1 Airway Equipment and Method: Stylet and Oral airway Placement Confirmation: ETT inserted through vocal cords under direct vision, positive ETCO2 and breath sounds checked- equal and bilateral Secured at: 23 cm Tube secured with: Tape Dental Injury: Teeth and Oropharynx as per pre-operative assessment  Comments: Cords clear. CA

## 2022-06-22 NOTE — Discharge Instructions (Signed)
AMBULATORY SURGERY  ?DISCHARGE INSTRUCTIONS ? ? ?The drugs that you were given will stay in your system until tomorrow so for the next 24 hours you should not: ? ?Drive an automobile ?Make any legal decisions ?Drink any alcoholic beverage ? ? ?You may resume regular meals tomorrow.  Today it is better to start with liquids and gradually work up to solid foods. ? ?You may eat anything you prefer, but it is better to start with liquids, then soup and crackers, and gradually work up to solid foods. ? ? ?Please notify your doctor immediately if you have any unusual bleeding, trouble breathing, redness and pain at the surgery site, drainage, fever, or pain not relieved by medication. ? ? ? ?Additional Instructions: ? ? ? ?Please contact your physician with any problems or Same Day Surgery at [phone], Monday through Friday 6 am to 4 pm, or [facility] Main number at [phone].  ?

## 2022-06-22 NOTE — Anesthesia Preprocedure Evaluation (Signed)
Anesthesia Evaluation  Patient identified by MRN, date of birth, ID band Patient awake    Reviewed: Allergy & Precautions, NPO status , Patient's Chart, lab work & pertinent test results  History of Anesthesia Complications (+) history of anesthetic complications  Airway Mallampati: IV  TM Distance: >3 FB Neck ROM: full    Dental  (+) Dental Advidsory Given, Chipped, Poor Dentition   Pulmonary shortness of breath and with exertion, COPD,  COPD inhaler, neg recent URI, former smoker    + decreased breath sounds      Cardiovascular hypertension, (-) angina (-) Past MI and (-) CABG negative cardio ROS Normal cardiovascular exam     Neuro/Psych negative neurological ROS  negative psych ROS   GI/Hepatic Neg liver ROS,GERD  ,,  Endo/Other  negative endocrine ROS    Renal/GU Renal disease     Musculoskeletal   Abdominal   Peds  Hematology negative hematology ROS (+)   Anesthesia Other Findings Past Medical History: No date: Acid reflux No date: Allergy No date: Arthritis     Comment:  both hands 02/2018: Cancer of bladder neck ([place])     Comment:  Chemo tx's and surgical resection. No date: Chronic kidney disease No date: Complication of anesthesia     Comment:  behaviors combative No date: COPD (chronic obstructive pulmonary disease) (HCC) No date: High cholesterol No date: History of kidney stones 04/03/2018: Hyperlipidemia No date: Hypertension No date: Pneumonia No date: Pre-diabetes  Past Surgical History: 2020: bladder removed 08/10/2018: CYSTOSCOPY W/ URETERAL STENT REMOVAL; Right     Comment:  Procedure: CYSTOSCOPY WITH STENT REMOVAL;  Surgeon:               Alexis Frock, MD;  Location: [facility];  Service:               Urology;  Laterality: Right; 04/04/2018: CYSTOSCOPY WITH BIOPSY; N/A     Comment:  Procedure: CYSTOSCOPY WITH TURBT;  Surgeon: Hollice Espy, MD;  Location: [facility];   Service: Urology;                Laterality: N/A; 04/04/2018: CYSTOSCOPY WITH URETEROSCOPY AND STENT PLACEMENT; Right     Comment:  Procedure: CYSTOSCOPY WITH URETEROSCOPY AND STENT               PLACEMENT;  Surgeon: Hollice Espy, MD;  Location: [facility]              ORS;  Service: Urology;  Laterality: Right; No date: HERNIA REPAIR 04/24/2021: LAPAROTOMY; N/A     Comment:  Procedure: EXPLORATORY LAPAROTOMY; PARASTOMAL HERNIA               REPAIR Sugerbaker technique  incisional hernia repair,               right sided TAR;  Surgeon: Stechschulte, Nickola Major, MD;                Location: [facility];  Service: General;  Laterality: N/A; No date: NASAL SINUS SURGERY 04/23/2018: PORTA CATH INSERTION; N/A     Comment:  Procedure: PORTA CATH INSERTION;  Surgeon: Algernon Huxley,              MD;  Location: [facility];  Service:               Cardiovascular;  Laterality: N/A; 09/04/2018: PORTA CATH REMOVAL; N/A  Comment:  Procedure: PORTA CATH REMOVAL;  Surgeon: Katha Cabal, MD;  Location: [facility];  Service:              Cardiovascular;  Laterality: N/A; 09/05/2018: TEE WITHOUT CARDIOVERSION; N/A     Comment:  Procedure: TRANSESOPHAGEAL ECHOCARDIOGRAM (TEE);                Surgeon: Minna Merritts, MD;  Location: [facility];                Service: Cardiovascular;  Laterality: N/A; 04/04/2018: TRANSURETHRAL RESECTION OF PROSTATE; N/A     Comment:  Procedure: TRANSURETHRAL RESECTION OF THE PROSTATE               (TURP);  Surgeon: Hollice Espy, MD;  Location: [facility]               ORS;  Service: Urology;  Laterality: N/A; 04/04/2018: URETERAL BIOPSY; Right     Comment:  Procedure: URETERAL BIOPSY;  Surgeon: Hollice Espy,               MD;  Location: [facility] OR[facility];  Service: Urology;  Laterality:               Right;  BMI    Body Mass Index: 35.24 kg/m      Reproductive/Obstetrics negative OB ROS                              Anesthesia Physical Anesthesia Plan  ASA: 3  Anesthesia Plan: General ETT   Post-op Pain Management:    Induction: Intravenous  PONV Risk Score and Plan: 3 and Ondansetron, Dexamethasone and Midazolam  Airway Management Planned: Oral ETT  Additional Equipment:   Intra-op Plan:   Post-operative Plan: Extubation in OR  Informed Consent: I have reviewed the patients History and Physical, chart, labs and discussed the procedure including the risks, benefits and alternatives for the proposed anesthesia with the patient or authorized representative who has indicated his/her understanding and acceptance.     Dental Advisory Given  Plan Discussed with: Anesthesiologist, CRNA and Surgeon  Anesthesia Plan Comments: (Patient consented for risks of anesthesia including but not limited to:  - adverse reactions to medications - damage to eyes, teeth, lips or other oral mucosa - nerve damage due to positioning  - sore throat or hoarseness - Damage to heart, brain, nerves, lungs, other parts of body or loss of life  Patient voiced understanding.)       Anesthesia Quick Evaluation

## 2022-06-22 NOTE — Op Note (Signed)
06/22/2022  10:03 AM    Steve Gilbert  [id number]   Pre-Op Diagnosis:  Benign neoplasm left submandibular gland  Post-op Diagnosis: Benign neoplasm left submandibular gland  Procedure: Excision of left Submandibular gland mass  Surgeon:  Riley Nearing  Assistant: Margaretha Sheffield  Anesthesia:  General endotracheal anesthesia  EBL:  less than 15 cc  Complications:  None  Findings: 2.5 cm well encapsulated mass separate from the body of the left submandibular gland, attached at most by a small stalk of glandular tissue.  Procedure: The patient was taken to the Operating Room and placed in the supine position.  After induction of general endotracheal anesthesia, the patient was turned 90 degrees and placed on a shoulder roll. The skin was injected along the proposed incision inferior to the left submandibular region with 1% lidocaine with epinephrine, 1:100,000,000. The facial nerve monitor electrodes were placed in the usual fashion at the lower lip on the same side of the procedure. Proper functioning of the nerve monitor was assessed. The area was then prepped and draped in the usual sterile fashion.   A 15 blade was then used to incise the skin. The dissection was carried down to the subcutaneous tissues with the harmonic scalpel and through the platysma muscle. Dissection then proceeded deeper, dissecting down to the inferior margin of the submandibular gland and the associated mass, watching for the marginal mandibular nerve during dissection. The inferior aspect of the mass was dissected from the overlying platysma and fascia using the harmonic scalpel,  watching for the marginal nerve during dissection. The mass was freed anteriorly from the submandibular and also freed from the deep adipose tissue, staying on the capsule of the mass without violating it. Blunt dissection was possible during much of this dissection, bluntly separating surrounding tissues from the well encapsulated  mass. Small veins were divided using the Harmonic scalpel. The marginal nerve was identified over the superior aspect of the mass, confirming with the nerve stimulator. The fascia containing the nerve was carefully dissected away from the mass. At this point the mass was only attached by a small stalk of adipose, and what appeared to be a small stalk of normal glandular tissue. This was divided with the Harmonic scalpel and the mass delivered. The wound was irrigated with saline and hemostasis assessed, and was excellent. There was felt to be no need for a drain. Deep closure was performed with 4-0 Vicryl suture, closing the platysma. The subcutaneous tissues were then closed with 4-0 Vicryl suture in an interrupted fashion. The skin was closed with 5-0 Prolene suture in a running locked stitch. Bacitracin ointment was applied to the wound.   The patient was then returned to the anesthesiologist for awakening, and was taken to the Recovery Room in stable condition.   Cultures:  None.   Disposition:   PACU then discharge home   Plan: Discharge home. Follow-up in 1 week for suture removal.     Riley Nearing 06/22/2022 10:03 AM

## 2022-06-22 NOTE — Anesthesia Postprocedure Evaluation (Signed)
Anesthesia Post Note  Patient: Steve Gilbert  Procedure(s) Performed: EXCISION SUBMANDIBULAR GLAND (Left)  Patient location during evaluation: PACU Anesthesia Type: General Level of consciousness: awake and alert Pain management: pain level controlled Vital Signs Assessment: post-procedure vital signs reviewed and stable Respiratory status: spontaneous breathing, nonlabored ventilation, respiratory function stable and patient connected to nasal cannula oxygen Cardiovascular status: blood pressure returned to baseline and stable Postop Assessment: no apparent nausea or vomiting Anesthetic complications: no  No notable events documented.   Last Vitals:  Vitals:   06/22/22 1100 06/22/22 1109  BP: 122/79 116/77  Pulse: 83 84  Resp: 12 18  Temp:  (!) 36.4 C  SpO2: 91% 95%    Last Pain:  Vitals:   06/22/22 1109  TempSrc: Temporal  PainSc: 0-No pain                 Dimas Millin

## 2022-07-01 DIAGNOSIS — A4901 Methicillin susceptible Staphylococcus aureus infection, unspecified site: Secondary | ICD-10-CM | POA: Diagnosis not present

## 2022-07-01 DIAGNOSIS — R7881 Bacteremia: Secondary | ICD-10-CM | POA: Diagnosis not present

## 2022-07-01 DIAGNOSIS — C678 Malignant neoplasm of overlapping sites of bladder: Secondary | ICD-10-CM | POA: Diagnosis not present

## 2022-07-01 DIAGNOSIS — N130 Hydronephrosis with ureteropelvic junction obstruction: Secondary | ICD-10-CM | POA: Diagnosis not present

## 2022-07-01 DIAGNOSIS — Z936 Other artificial openings of urinary tract status: Secondary | ICD-10-CM | POA: Diagnosis not present

## 2022-07-01 DIAGNOSIS — C775 Secondary and unspecified malignant neoplasm of intrapelvic lymph nodes: Secondary | ICD-10-CM | POA: Diagnosis not present

## 2022-07-01 LAB — SURGICAL PATHOLOGY

## 2022-07-05 DIAGNOSIS — N130 Hydronephrosis with ureteropelvic junction obstruction: Secondary | ICD-10-CM | POA: Diagnosis not present

## 2022-07-05 DIAGNOSIS — R7881 Bacteremia: Secondary | ICD-10-CM | POA: Diagnosis not present

## 2022-07-05 DIAGNOSIS — C775 Secondary and unspecified malignant neoplasm of intrapelvic lymph nodes: Secondary | ICD-10-CM | POA: Diagnosis not present

## 2022-07-05 DIAGNOSIS — C678 Malignant neoplasm of overlapping sites of bladder: Secondary | ICD-10-CM | POA: Diagnosis not present

## 2022-07-05 DIAGNOSIS — A4901 Methicillin susceptible Staphylococcus aureus infection, unspecified site: Secondary | ICD-10-CM | POA: Diagnosis not present

## 2022-07-05 DIAGNOSIS — Z936 Other artificial openings of urinary tract status: Secondary | ICD-10-CM | POA: Diagnosis not present

## 2022-07-14 ENCOUNTER — Encounter: Payer: Self-pay | Admitting: Urology

## 2022-07-14 ENCOUNTER — Ambulatory Visit (INDEPENDENT_AMBULATORY_CARE_PROVIDER_SITE_OTHER): Payer: PPO | Admitting: Urology

## 2022-07-14 VITALS — BP 128/82 | HR 92 | Ht 66.0 in | Wt 223.0 lb

## 2022-07-14 DIAGNOSIS — C679 Malignant neoplasm of bladder, unspecified: Secondary | ICD-10-CM

## 2022-07-14 DIAGNOSIS — Z8551 Personal history of malignant neoplasm of bladder: Secondary | ICD-10-CM | POA: Diagnosis not present

## 2022-07-14 DIAGNOSIS — N432 Other hydrocele: Secondary | ICD-10-CM

## 2022-07-14 NOTE — Patient Instructions (Signed)
Hydrocele, Adult A hydrocele is a collection of fluid in the loose pouch of skin that holds the testicles (scrotum). It can occur in one or both testicles. This may happen because: The amount of fluid produced in the scrotum is not absorbed by the rest of the body. Fluid from the abdomen fills the scrotum. Normally, the testicles develop in the abdomen and then drop into the scrotum before birth. The tube that the testicles travel through usually closes after the testicles drop. If the tube does not close, fluid from the abdomen can fill the scrotum. This is not very common in adults. What are the causes? A hydrocele may be caused by: An injury to the scrotum. An infection. Decreased blood flow to the scrotum. Twisting of a testicle (testicular torsion). A birth defect. A tumor or cancer of the testicle. Sometimes, the cause is not known. What are the signs or symptoms? A hydrocele feels like a water-filled balloon. It may also feel heavy. Other symptoms include: Swelling of the scrotum. The swelling may decrease when you lie down. You may also notice more swelling at night than in the morning. This is called a communicating hydrocele, in which the fluid in the scrotum goes back into the abdominal cavity when the position of the scrotum changes. Swelling of the groin. Mild discomfort in the scrotum. Pain. This can develop if the hydrocele was caused by infection or twisting. The larger the hydrocele, the more likely you are to have pain. Swelling may also cause pain. How is this diagnosed? This condition may be diagnosed based on a physical exam and your medical history. You may also have tests, including: Imaging tests, such as an ultrasound. A transillumination test. This test takes place in a dark room where a light is placed on the skin of the scrotum. Clear liquid will not impede the light and the scrotum will be illuminated. This helps a health care provider distinguish a hydrocele from a  tumor. Blood or urine tests. How is this treated? Most hydroceles go away on their own. If you have no discomfort or pain, your health care provider may suggest close monitoring of your condition until the condition goes away or symptoms develop. This is called watch and wait or watchful waiting. If treatment is needed, it may include: Treating an underlying condition. This may include taking an antibiotic medicine to treat an infection. Having surgery to stop fluid from collecting in the scrotum. Having surgery to drain the fluid. Surgery may include: Hydrocelectomy. For this procedure, an incision is made in the scrotum to remove the fluid. Needle aspiration. A needle is used to drain fluid. However, the fluid buildup will come back quickly and may lead to an infection of the scrotum. This is rarely done. Follow these instructions at home: Medicines Take over-the-counter and prescription medicines only as told by your health care provider. If you were prescribed an antibiotic medicine, take it as told by your health care provider. Do not stop taking the antibiotic even if you start to feel better. General instructions Watch the hydrocele for any changes. Keep all follow-up visits. This is important. Contact a health care provider if: You notice any changes in the hydrocele. The swelling in your scrotum or groin gets worse. The hydrocele becomes red, firm, painful, or tender to the touch. You have a fever. Get help right away if you: Develop a lot of pain or your pain becomes worse. Have chills. Have a high fever. Summary A hydrocele is   a collection of fluid in the loose pouch of skin that holds the testicles (scrotum). A hydrocele can cause swelling, discomfort, and pain. In adults, the cause of a hydrocele may not be known. However, it is sometimes caused by an infection or the twisting of a testicle. Hydroceles often go away on their own. If a hydrocele causes pain, treating the  underlying cause may be needed to ease the pain. This information is not intended to replace advice given to you by your health care provider. Make sure you discuss any questions you have with your health care provider. Document Revised: 02/18/2021 Document Reviewed: 02/18/2021 Elsevier Patient Education  2023 Elsevier Inc.  

## 2022-07-14 NOTE — Progress Notes (Signed)
   07/14/2022 4:44 PM   Steve Gilbert 05-12-1951 [id number]  Reason for visit: Follow up bladder cancer, right hydrocele  HPI: 71 year old male with history of bladder cancer treated with neoadjuvant chemotherapy and radical cystoprostatectomy and ileal conduit diversion with Dr. Tresa Moore in January 2020.  No evidence of recurrence since that time, and he follows regularly with oncology for surveillance imaging.   He was referred to me in June 2023 for mild right-sided scrotal swelling.  Scrotal ultrasound showed a small right hydrocele, which was stable from prior CT scans.  No evidence of hernia on most recent CT.  I personally viewed and interpreted those images from 03/24/2022 showing a moderate right hydrocele.  He also has chronic scrotal itching, which is unlikely to be related to his small to medium sized hydrocele.  He is using Goldbond powder which seems to help.  We again reviewed options for his hydrocele including supportive underwear/observation or hydrocelectomy.  We discussed indication for intervention would be enlarging size/patient bother/decreased quality of life.  He is minimally bothered at this time and would like to continue observation which is reasonable.  Risk and benefits of hydrocelectomy were discussed.  RTC 1 year, sooner if problems Continue follow-up with oncology for imaging for history of bladder cancer s/p cystectomy and ileal conduit  [name], MD  [facility] [address], [place], [place] [address] [phone]

## 2022-07-28 DIAGNOSIS — Z933 Colostomy status: Secondary | ICD-10-CM | POA: Diagnosis not present

## 2022-07-28 DIAGNOSIS — J441 Chronic obstructive pulmonary disease with (acute) exacerbation: Secondary | ICD-10-CM | POA: Diagnosis not present

## 2022-07-28 DIAGNOSIS — C675 Malignant neoplasm of bladder neck: Secondary | ICD-10-CM | POA: Diagnosis not present

## 2022-07-28 DIAGNOSIS — N1831 Chronic kidney disease, stage 3a: Secondary | ICD-10-CM | POA: Diagnosis not present

## 2022-08-04 DIAGNOSIS — J440 Chronic obstructive pulmonary disease with acute lower respiratory infection: Secondary | ICD-10-CM | POA: Diagnosis not present

## 2022-08-04 DIAGNOSIS — Z9221 Personal history of antineoplastic chemotherapy: Secondary | ICD-10-CM | POA: Diagnosis not present

## 2022-08-04 DIAGNOSIS — Z7982 Long term (current) use of aspirin: Secondary | ICD-10-CM | POA: Diagnosis not present

## 2022-08-04 DIAGNOSIS — Z8551 Personal history of malignant neoplasm of bladder: Secondary | ICD-10-CM | POA: Diagnosis not present

## 2022-08-04 DIAGNOSIS — E785 Hyperlipidemia, unspecified: Secondary | ICD-10-CM | POA: Diagnosis not present

## 2022-08-04 DIAGNOSIS — N1831 Chronic kidney disease, stage 3a: Secondary | ICD-10-CM | POA: Diagnosis not present

## 2022-08-04 DIAGNOSIS — K219 Gastro-esophageal reflux disease without esophagitis: Secondary | ICD-10-CM | POA: Diagnosis not present

## 2022-08-04 DIAGNOSIS — R7303 Prediabetes: Secondary | ICD-10-CM | POA: Diagnosis not present

## 2022-08-04 DIAGNOSIS — Z87891 Personal history of nicotine dependence: Secondary | ICD-10-CM | POA: Diagnosis not present

## 2022-08-04 DIAGNOSIS — I129 Hypertensive chronic kidney disease with stage 1 through stage 4 chronic kidney disease, or unspecified chronic kidney disease: Secondary | ICD-10-CM | POA: Diagnosis not present

## 2022-08-19 ENCOUNTER — Telehealth: Payer: Self-pay | Admitting: *Deleted

## 2022-08-19 NOTE — Patient Outreach (Signed)
  Care Coordination   08/19/2022 Name: Steve Gilbert MRN: [id number] DOB: 12/01/1950   Care Coordination Outreach Attempts:  An unsuccessful telephone outreach was attempted today to offer the patient information about available care coordination services as a benefit of their health plan.   Follow Up Plan:  Additional outreach attempts will be made to offer the patient care coordination information and services.   Encounter Outcome:  No Answer   Care Coordination Interventions:  No, not indicated    Valente David, RN, MSN, [facility]  [facility] Care Management Care Management Coordinator [phone]

## 2022-08-23 DIAGNOSIS — I10 Essential (primary) hypertension: Secondary | ICD-10-CM | POA: Diagnosis not present

## 2022-08-24 ENCOUNTER — Encounter: Payer: Self-pay | Admitting: *Deleted

## 2022-08-24 ENCOUNTER — Telehealth: Payer: Self-pay | Admitting: *Deleted

## 2022-08-24 NOTE — Patient Outreach (Signed)
  Care Coordination   Follow Up Visit Note   08/24/2022 Name: Steve Gilbert MRN: [id number] DOB: [birth year]  Steve Gilbert is a 72 y.o. year old male who sees Kirk Ruths, MD for primary care. I spoke with  Steve Gilbert by phone today.  What matters to the patients health and wellness today?  Has cough and congestion, state he has issues when weather changes, particularly when it is cold.     Goals Addressed             This Visit's Progress    Effective management of COPD       Care Coordination Interventions: Provided patient with basic written and verbal COPD education on self care/management/and exacerbation prevention Advised patient to track and manage COPD triggers Provided instruction about proper use of medications used for management of COPD including inhalers Advised patient to self assesses COPD action plan zone and make appointment with provider if in the yellow zone for 48 hours without improvement Provided education about and advised patient to utilize infection prevention strategies to reduce risk of respiratory infection Screening for signs and symptoms of depression related to chronic disease state  Assessed social determinant of health barriers Discussed use of inhalers and nasal sprays Discussed monitoring blood pressure daily, state usually range 130/80s.  He is careful with the type of cough and congestion medication he uses so it does not increase blood pressure Next follow up with PCP on 3/7         SDOH assessments and interventions completed:  Yes  SDOH Interventions Today    Flowsheet Row Most Recent Value  SDOH Interventions   Food Insecurity Interventions Intervention Not Indicated  Housing Interventions Intervention Not Indicated  Transportation Interventions Intervention Not Indicated        Care Coordination Interventions:  Yes, provided   Follow up plan: Follow up call scheduled for 3/19    Encounter Outcome:  Pt.  Visit Completed   Valente David, RN, MSN, [place] Care Management Care Management Coordinator [phone]

## 2022-08-24 NOTE — Patient Instructions (Signed)
Visit Information  Thank you for taking time to visit with me today. Please don't hesitate to contact me if I can be of assistance to you before our next scheduled telephone appointment.  Following are the goals we discussed today:  Notify provider if you do not feel better and develop fever with cough/congestion.  Our next appointment is by telephone on 3/19  Please call the care guide team at [phone] if you need to cancel or reschedule your appointment.   Please call the Suicide and Crisis Lifeline: 988 call the [place] National Suicide Prevention Lifeline: 365-808-2538 or TTY: (662)554-0316 TTY (412) 387-0944) to talk to a trained counselor call 1-800-273-TALK (toll free, 24 hour hotline) call 911 if you are experiencing a Mental Health or [place] or need someone to talk to.  Patient verbalizes understanding of instructions and care plan provided today and agrees to view in [place]. Active MyChart status and patient understanding of how to access instructions and care plan via MyChart confirmed with patient.     The patient has been provided with contact information for the care management team and has been advised to call with any health related questions or concerns.   Valente David, RN, MSN, [place] Care Management Care Management Coordinator [phone]

## 2022-08-26 DIAGNOSIS — J441 Chronic obstructive pulmonary disease with (acute) exacerbation: Secondary | ICD-10-CM | POA: Diagnosis not present

## 2022-08-26 DIAGNOSIS — J205 Acute bronchitis due to respiratory syncytial virus: Secondary | ICD-10-CM | POA: Diagnosis not present

## 2022-08-26 DIAGNOSIS — Z03818 Encounter for observation for suspected exposure to other biological agents ruled out: Secondary | ICD-10-CM | POA: Diagnosis not present

## 2022-09-02 DIAGNOSIS — C775 Secondary and unspecified malignant neoplasm of intrapelvic lymph nodes: Secondary | ICD-10-CM | POA: Diagnosis not present

## 2022-09-02 DIAGNOSIS — C678 Malignant neoplasm of overlapping sites of bladder: Secondary | ICD-10-CM | POA: Diagnosis not present

## 2022-09-02 DIAGNOSIS — R7881 Bacteremia: Secondary | ICD-10-CM | POA: Diagnosis not present

## 2022-09-02 DIAGNOSIS — Z936 Other artificial openings of urinary tract status: Secondary | ICD-10-CM | POA: Diagnosis not present

## 2022-09-02 DIAGNOSIS — N130 Hydronephrosis with ureteropelvic junction obstruction: Secondary | ICD-10-CM | POA: Diagnosis not present

## 2022-09-02 DIAGNOSIS — A4901 Methicillin susceptible Staphylococcus aureus infection, unspecified site: Secondary | ICD-10-CM | POA: Diagnosis not present

## 2022-09-05 ENCOUNTER — Encounter: Payer: Self-pay | Admitting: Internal Medicine

## 2022-09-10 ENCOUNTER — Other Ambulatory Visit: Payer: Self-pay | Admitting: Internal Medicine

## 2022-09-22 DIAGNOSIS — E7800 Pure hypercholesterolemia, unspecified: Secondary | ICD-10-CM | POA: Diagnosis not present

## 2022-09-22 DIAGNOSIS — R7303 Prediabetes: Secondary | ICD-10-CM | POA: Diagnosis not present

## 2022-09-22 DIAGNOSIS — J449 Chronic obstructive pulmonary disease, unspecified: Secondary | ICD-10-CM | POA: Diagnosis not present

## 2022-09-22 DIAGNOSIS — I10 Essential (primary) hypertension: Secondary | ICD-10-CM | POA: Diagnosis not present

## 2022-10-03 DIAGNOSIS — N130 Hydronephrosis with ureteropelvic junction obstruction: Secondary | ICD-10-CM | POA: Diagnosis not present

## 2022-10-03 DIAGNOSIS — H25813 Combined forms of age-related cataract, bilateral: Secondary | ICD-10-CM | POA: Diagnosis not present

## 2022-10-03 DIAGNOSIS — C678 Malignant neoplasm of overlapping sites of bladder: Secondary | ICD-10-CM | POA: Diagnosis not present

## 2022-10-03 DIAGNOSIS — Z936 Other artificial openings of urinary tract status: Secondary | ICD-10-CM | POA: Diagnosis not present

## 2022-10-04 ENCOUNTER — Ambulatory Visit: Payer: Self-pay | Admitting: *Deleted

## 2022-10-04 NOTE — Patient Outreach (Signed)
  Care Coordination   Follow Up Visit Note   10/04/2022 Name: Steve Gilbert MRN: [id number] DOB: 02/21/1951  Steve Gilbert is a 72 y.o. year old male who sees Kirk Ruths, MD for primary care. I spoke with  [employer] by phone today.  What matters to the patients health and wellness today?  Ongoing healthy management of COPD.    Goals Addressed             This Visit's Progress    Effective management of COPD   On track    Care Coordination Interventions: Provided patient with basic written and verbal COPD education on self care/management/and exacerbation prevention Advised patient to track and manage COPD triggers Provided instruction about proper use of medications used for management of COPD including inhalers Advised patient to self assesses COPD action plan zone and make appointment with provider if in the yellow zone for 48 hours without improvement Provided education about and advised patient to utilize infection prevention strategies to reduce risk of respiratory infection Discussed use of inhalers and nasal sprays Discussed monitoring blood pressure daily, state usually range 120-130/80s.  Next follow up with PCP in 6 months (last seen on 3/7)         SDOH assessments and interventions completed:  No     Care Coordination Interventions:  Yes, provided   Interventions Today    Flowsheet Row Most Recent Value  Chronic Disease   Chronic disease during today's visit Chronic Obstructive Pulmonary Disease (COPD)  General Interventions   General Interventions Discussed/Reviewed General Interventions Reviewed, Doctor Visits  Doctor Visits Discussed/Reviewed Doctor Visits Reviewed, PCP  PCP/Specialist Visits Compliance with follow-up visit  Education Interventions   Education Provided Provided Education  Provided Verbal Education On When to see the doctor, Medication  Pharmacy Interventions   Pharmacy Dicussed/Reviewed Pharmacy Topics Reviewed        Follow up plan: Follow up call scheduled for 6/6    Encounter Outcome:  Pt. Visit Completed   Valente David, RN, MSN, [place] Care Management Care Management Coordinator [phone]

## 2022-10-26 ENCOUNTER — Inpatient Hospital Stay: Payer: PPO | Attending: Internal Medicine

## 2022-10-26 ENCOUNTER — Encounter: Payer: Self-pay | Admitting: Internal Medicine

## 2022-10-26 ENCOUNTER — Inpatient Hospital Stay (HOSPITAL_BASED_OUTPATIENT_CLINIC_OR_DEPARTMENT_OTHER): Payer: PPO | Admitting: Internal Medicine

## 2022-10-26 VITALS — BP 124/77 | HR 67 | Temp 96.0°F | Resp 18 | Wt 223.0 lb

## 2022-10-26 DIAGNOSIS — Z87891 Personal history of nicotine dependence: Secondary | ICD-10-CM | POA: Diagnosis not present

## 2022-10-26 DIAGNOSIS — C675 Malignant neoplasm of bladder neck: Secondary | ICD-10-CM

## 2022-10-26 DIAGNOSIS — Z9221 Personal history of antineoplastic chemotherapy: Secondary | ICD-10-CM | POA: Insufficient documentation

## 2022-10-26 DIAGNOSIS — Z7982 Long term (current) use of aspirin: Secondary | ICD-10-CM | POA: Diagnosis not present

## 2022-10-26 DIAGNOSIS — Z9079 Acquired absence of other genital organ(s): Secondary | ICD-10-CM | POA: Insufficient documentation

## 2022-10-26 DIAGNOSIS — J302 Other seasonal allergic rhinitis: Secondary | ICD-10-CM | POA: Insufficient documentation

## 2022-10-26 DIAGNOSIS — Z79899 Other long term (current) drug therapy: Secondary | ICD-10-CM | POA: Diagnosis not present

## 2022-10-26 DIAGNOSIS — Z8551 Personal history of malignant neoplasm of bladder: Secondary | ICD-10-CM | POA: Insufficient documentation

## 2022-10-26 DIAGNOSIS — N183 Chronic kidney disease, stage 3 unspecified: Secondary | ICD-10-CM | POA: Diagnosis not present

## 2022-10-26 LAB — COMPREHENSIVE METABOLIC PANEL
ALT: 18 U/L (ref 0–44)
AST: 22 U/L (ref 15–41)
Albumin: 4 g/dL (ref 3.5–5.0)
Alkaline Phosphatase: 76 U/L (ref 38–126)
Anion gap: 6 (ref 5–15)
BUN: 20 mg/dL (ref 8–23)
CO2: 27 mmol/L (ref 22–32)
Calcium: 9 mg/dL (ref 8.9–10.3)
Chloride: 107 mmol/L (ref 98–111)
Creatinine, Ser: 1.9 mg/dL — ABNORMAL HIGH (ref 0.61–1.24)
GFR, Estimated: 37 mL/min — ABNORMAL LOW (ref >60)
Glucose, Bld: 139 mg/dL — ABNORMAL HIGH (ref 70–99)
Potassium: 4.9 mmol/L (ref 3.5–5.1)
Sodium: 140 mmol/L (ref 135–145)
Total Bilirubin: 0.9 mg/dL (ref 0.3–1.2)
Total Protein: 7.3 g/dL (ref 6.5–8.1)

## 2022-10-26 LAB — CBC WITH DIFFERENTIAL/PLATELET
Abs Immature Granulocytes: 0.01 10*3/uL (ref 0.00–0.07)
Basophils Absolute: 0.1 10*3/uL (ref 0.0–0.1)
Basophils Relative: 1 %
Eosinophils Absolute: 0.2 10*3/uL (ref 0.0–0.5)
Eosinophils Relative: 4 %
HCT: 44 % (ref 39.0–52.0)
Hemoglobin: 14.6 g/dL (ref 13.0–17.0)
Immature Granulocytes: 0 %
Lymphocytes Relative: 13 %
Lymphs Abs: 0.7 10*3/uL (ref 0.7–4.0)
MCH: 30.8 pg (ref 26.0–34.0)
MCHC: 33.2 g/dL (ref 30.0–36.0)
MCV: 92.8 fL (ref 80.0–100.0)
Monocytes Absolute: 0.6 10*3/uL (ref 0.1–1.0)
Monocytes Relative: 10 %
Neutro Abs: 3.8 10*3/uL (ref 1.7–7.7)
Neutrophils Relative %: 72 %
Platelets: 267 10*3/uL (ref 150–400)
RBC: 4.74 MIL/uL (ref 4.22–5.81)
RDW: 13.6 % (ref 11.5–15.5)
WBC: 5.4 10*3/uL (ref 4.0–10.5)
nRBC: 0 % (ref 0.0–0.2)

## 2022-10-26 NOTE — Progress Notes (Signed)
Patient here for oncology follow-up appointment,  concerns of blood tinged mucous and headaches

## 2022-10-26 NOTE — Assessment & Plan Note (Addendum)
#  Transitional cell bladder cancer neo-adjuvant chemo-s/p 4 cycles of dose dense MVAC; status post cystectomy [JAN 2021]. SEP 2023: New ill-defined focal soft tissue located adjacent to the wall of a distal small bowel loop with surrounding fat stranding. PET scan OCT 2023-no evidence of metastatic disease noted. Stable.   #DEC 2023- Left submandibular nodule-PET avid-s/p ultrasound-guided biopsy-benign adenoid tumor-  re: Excision s/p referral- s/p resection- Left; Resection:  Pleomorphic Adenoma. Negative for Malignancy. Reviewed with patient.   # Chronic kidney disease-stage III- GFR- 37- stable-  [Dr.M; [facility]]; continue follow up with nephology.   # HNT-on metoprolol.  Recommend follow up PCP re: further refills. Stable.   # Seasonal allergies: recommend compliance with anti-histamines; stable.   # DISPOSITION:  # DISPOSITION: # follow up in 6 months/labs-MD;  cbc/cmp;b12; folic acid-Dr.B

## 2022-10-26 NOTE — Progress Notes (Signed)
[facility] CONSULT NOTE  Patient Care Team: Lauro Regulus, MD as PCP - General (Internal Medicine) Vanna Scotland, MD as Consulting Physician (Urology) Earna Coder, MD as Consulting Physician (Internal Medicine) Kemper Durie, RN as [facility] Care Management  CHIEF COMPLAINTS/PURPOSE OF CONSULTATION:  Bladder cancer  #  Oncology History Overview Note  # BLADDER CANCER: pT2/cpT4  prostate/bladder neck Dr.Brandon TURP s/p  - INVASIVE UROTHELIAL CARCINOMA, HIGH-GRADE;  TUMOR INVADES BLADDER NECK MUSCULARIS PROPRIA. Mild-mod Right hydronephrosis [facility] RISK FEATURES]   # OCt 9t ddMVAC x4; Jan 24th- 2021 cystectomy [Dr.Manny; [place]] ypTis ypN1; stage III suriveillaince  # Feb 2020- UTI/sepsis- ICU  x3 weeks IV antibiotics/ s-p port removal.   # 2 Echo- 50% to 55%. [oct 2019]  # COPD/ not on O2; [quit 2009];    DIAGNOSIS: BLADDER CA  STAGE: Stage III;GOALS: cure  CURRENT/MOST RECENT THERAPY : Surveillance    Cancer of bladder neck    HISTORY OF PRESENTING ILLNESS: Alone.  Ambulating independently.  Steve Gilbert 72 y.o.  male with muscle invasive bladder cancer status post neoadjuvant chemotherapy-followed by cystectomy currently on surveillance is here for follow-up..  Patient here for oncology follow-up appointment.  patient concerned of blood tinged mucous and headaches. Complains of seasonal allergies.   Patient denies any new onset of back pain or joint pains. Denies any shortness of breath or cough no bone pain.  No nausea no vomiting. No worsening shortness of breath or chest pain.  Review of Systems  Constitutional:  Negative for chills, diaphoresis, fever and weight loss.  HENT:  Negative for nosebleeds and sore throat.   Eyes:  Negative for double vision.  Cardiovascular:  Negative for chest pain, palpitations, orthopnea and leg swelling.  Gastrointestinal:  Negative for abdominal pain, blood in stool, constipation,  diarrhea, heartburn, melena, nausea and vomiting.  Genitourinary:  Negative for dysuria, frequency and urgency.  Musculoskeletal:  Positive for back pain and joint pain.  Neurological:  Negative for dizziness, tingling, focal weakness, weakness and headaches.  Endo/Heme/Allergies:  Does not bruise/bleed easily.  Psychiatric/Behavioral:  Negative for depression. The patient is not nervous/anxious and does not have insomnia.      MEDICAL HISTORY:  Past Medical History:  Diagnosis Date   Acid reflux    Allergy    Arthritis    both hands   Cancer of bladder neck 02/2018   Chemo tx's and surgical resection.   Chronic kidney disease    Complication of anesthesia    behaviors combative   COPD (chronic obstructive pulmonary disease)    High cholesterol    History of kidney stones    Hyperlipidemia 04/03/2018   Hypertension    Pneumonia    Pre-diabetes     SURGICAL HISTORY: Past Surgical History:  Procedure Laterality Date   bladder removed  2020   CYSTOSCOPY W/ URETERAL STENT REMOVAL Right 08/10/2018   Procedure: CYSTOSCOPY WITH STENT REMOVAL;  Surgeon: Sebastian Ache, MD;  Location: [facility];  Service: Urology;  Laterality: Right;   CYSTOSCOPY WITH BIOPSY N/A 04/04/2018   Procedure: CYSTOSCOPY WITH TURBT;  Surgeon: Vanna Scotland, MD;  Location: [facility];  Service: Urology;  Laterality: N/A;   CYSTOSCOPY WITH URETEROSCOPY AND STENT PLACEMENT Right 04/04/2018   Procedure: CYSTOSCOPY WITH URETEROSCOPY AND STENT PLACEMENT;  Surgeon: Vanna Scotland, MD;  Location: [facility];  Service: Urology;  Laterality: Right;   HERNIA REPAIR     LAPAROTOMY N/A 04/24/2021   Procedure: EXPLORATORY LAPAROTOMY; PARASTOMAL HERNIA REPAIR  Sugerbaker technique  incisional hernia repair, right sided TAR;  Surgeon: Stechschulte, Hyman Hopes, MD;  Location: [facility];  Service: General;  Laterality: N/A;   NASAL SINUS SURGERY     PORTA CATH INSERTION N/A 04/23/2018   Procedure: PORTA CATH INSERTION;  Surgeon: Annice Needy, MD;  Location: [facility];  Service: Cardiovascular;  Laterality: N/A;   PORTA CATH REMOVAL N/A 09/04/2018   Procedure: PORTA CATH REMOVAL;  Surgeon: Renford Dills, MD;  Location: [facility];  Service: Cardiovascular;  Laterality: N/A;   SUBMANDIBULAR GLAND EXCISION Left 06/22/2022   Procedure: EXCISION SUBMANDIBULAR GLAND;  Surgeon: Geanie Logan, MD;  Location: [facility];  Service: ENT;  Laterality: Left;   TEE WITHOUT CARDIOVERSION N/A 09/05/2018   Procedure: TRANSESOPHAGEAL ECHOCARDIOGRAM (TEE);  Surgeon: Antonieta Iba, MD;  Location: [facility];  Service: Cardiovascular;  Laterality: N/A;   TRANSURETHRAL RESECTION OF PROSTATE N/A 04/04/2018   Procedure: TRANSURETHRAL RESECTION OF THE PROSTATE (TURP);  Surgeon: Vanna Scotland, MD;  Location: [facility];  Service: Urology;  Laterality: N/A;   URETERAL BIOPSY Right 04/04/2018   Procedure: URETERAL BIOPSY;  Surgeon: Vanna Scotland, MD;  Location: [facility];  Service: Urology;  Laterality: Right;     SOCIAL HISTORY:  Social History   Socioeconomic History   Marital status: Widowed    Spouse name: Not on file   Number of children: Not on file   Years of education: Not on file   Highest education level: Not on file  Occupational History   Not on file  Tobacco Use   Smoking status: Former    Packs/day: 1.50    Years: 30.00    Additional pack years: 0.00    Total pack years: 45.00    Types: Cigarettes    Quit date: 2009    Years since quitting: 15.2   Smokeless tobacco: Former  Building services engineer Use: Never used  Substance and Sexual Activity   Alcohol use: Not Currently    Alcohol/week: 7.0 standard drinks of alcohol    Types: 5 Cans of beer, 2 Standard drinks or equivalent per week    Comment: ocassional    Drug use: Not Currently   Sexual activity: Not Currently  Other Topics Concern   Not on file  Social History Narrative   currently retired.  Works part-time.  Remote history of smoking.   No alcohol.   Social Determinants of Health   Financial Resource Strain: Low Risk  (08/09/2018)   Overall Financial Resource Strain (CARDIA)    Difficulty of Paying Living Expenses: Not hard at all  Food Insecurity: No Food Insecurity (08/24/2022)   Hunger Vital Sign    Worried About Running Out of Food in the Last Year: Never true    Ran Out of Food in the Last Year: Never true  Transportation Needs: No Transportation Needs (08/24/2022)   PRAPARE - Administrator, Civil Service (Medical): No    Lack of Transportation (Non-Medical): No  Physical Activity: Unknown (08/09/2018)   Exercise Vital Sign    Days of Exercise per Week: Patient declined    Minutes of Exercise per Session: Patient declined  Stress: No Stress Concern Present (08/09/2018)   [employer] of Occupational Health - Occupational Stress Questionnaire    Feeling of Stress : Not at all  Social Connections: Unknown (08/09/2018)   Social Connection and Isolation Panel [NHANES]    Frequency of Communication with Friends and Family: Patient declined  Frequency of Social Gatherings with Friends and Family: Patient declined    Attends Religious Services: Patient declined    Active Member of Clubs or Organizations: Patient declined    Attends Banker Meetings: Patient declined    Marital Status: Patient declined  Intimate Partner Violence: Unknown (08/09/2018)   Humiliation, Afraid, Rape, and Kick questionnaire    Fear of Current or Ex-Partner: Patient declined    Emotionally Abused: Patient declined    Physically Abused: Patient declined    Sexually Abused: Patient declined    FAMILY HISTORY: Family History  Problem Relation Age of Onset   Diabetes Mother    Heart disease Father    Diabetes Brother    Heart attack Brother    Heart disease Brother     ALLERGIES:  is allergic to iodinated contrast media.  MEDICATIONS:  Current Outpatient Medications  Medication Sig Dispense Refill    albuterol (VENTOLIN HFA) 108 (90 Base) MCG/ACT inhaler Inhale into the lungs.     amLODipine (NORVASC) 10 MG tablet TAKE 1 TABLET(10 MG) BY MOUTH EVERY DAY     aspirin EC 81 MG tablet Take 1 tablet by mouth daily.     atorvastatin (LIPITOR) 20 MG tablet Take 20 mg by mouth daily.     azelastine (ASTELIN) 0.1 % nasal spray Place 1 spray into both nostrils 2 (two) times daily.     chlorthalidone (HYGROTON) 25 MG tablet Take by mouth.     fluticasone-salmeterol (ADVAIR) 250-50 MCG/ACT AEPB Inhale 1 puff into the lungs 2 (two) times daily.     metoprolol tartrate (LOPRESSOR) 25 MG tablet TAKE 1 TABLET(25 MG) BY MOUTH TWICE DAILY 180 tablet 1   Multiple Vitamins-Minerals (MULTIVITAMIN WITH MINERALS) tablet Take 1 tablet by mouth daily.     pantoprazole (PROTONIX) 40 MG tablet Take 40 mg by mouth every evening.      pseudoephedrine (SUDAFED) 30 MG tablet Take 30 mg by mouth every 4 (four) hours as needed for congestion.     tiotropium (SPIRIVA HANDIHALER) 18 MCG inhalation capsule Place 18 mcg into inhaler and inhale daily.     No current facility-administered medications for this visit.      Marland Kitchen  PHYSICAL EXAMINATION: ECOG PERFORMANCE STATUS: 1 - Symptomatic but completely ambulatory  Vitals:   10/26/22 1042  BP: 124/77  Pulse: 67  Resp: 18  Temp: (!) 96 F (35.6 C)  SpO2: 96%   Filed Weights   10/26/22 1042  Weight: 223 lb (101.2 kg)    Physical Exam Constitutional:      Comments: He is alone.  Walking himself.  HENT:     Head: Normocephalic and atraumatic.     Mouth/Throat:     Pharynx: No oropharyngeal exudate.  Eyes:     Pupils: Pupils are equal, round, and reactive to light.  Cardiovascular:     Rate and Rhythm: Normal rate and regular rhythm.  Pulmonary:     Effort: No respiratory distress.     Breath sounds: Normal breath sounds.  Abdominal:     General: Bowel sounds are normal. There is no distension.     Palpations: Abdomen is soft. There is no mass.      Tenderness: There is no abdominal tenderness. There is no guarding or rebound.     Comments: Urostomy.   Musculoskeletal:        General: No tenderness. Normal range of motion.     Cervical back: Normal range of motion and neck supple.  Skin:  General: Skin is warm.  Neurological:     Mental Status: He is alert and oriented to person, place, and time.  Psychiatric:        Mood and Affect: Affect normal.      LABORATORY DATA:  I have reviewed the data as listed Lab Results  Component Value Date   WBC 5.4 10/26/2022   HGB 14.6 10/26/2022   HCT 44.0 10/26/2022   MCV 92.8 10/26/2022   PLT 267 10/26/2022   Recent Labs    04/26/22 0813 10/26/22 1023  NA 140 140  K 4.4 4.9  CL 108 107  CO2 26 27  GLUCOSE 108* 139*  BUN 21 20  CREATININE 1.59* 1.90*  CALCIUM 9.0 9.0  GFRNONAA 46* 37*  PROT 7.3 7.3  ALBUMIN 3.8 4.0  AST 20 22  ALT 16 18  ALKPHOS 72 76  BILITOT 1.1 0.9    RADIOGRAPHIC STUDIES: I have personally reviewed the radiological images as listed and agreed with the findings in the report. No results found.  ASSESSMENT & PLAN:   Cancer of bladder neck (HCC) #Transitional cell bladder cancer neo-adjuvant chemo-s/p 4 cycles of dose dense MVAC; status post cystectomy [JAN 2021]. SEP 2023: New ill-defined focal soft tissue located adjacent to the wall of a distal small bowel loop with surrounding fat stranding. PET scan OCT 2023-no evidence of metastatic disease noted. Stable.   #DEC 2023- Left submandibular nodule-PET avid-s/p ultrasound-guided biopsy-benign adenoid tumor-  re: Excision s/p referral- s/p resection- Left; Resection:  Pleomorphic Adenoma. Negative for Malignancy. Reviewed with patient.   # Chronic kidney disease-stage III- GFR- 37- stable-  [Dr.M; [facility]]; continue follow up with nephology.   # HNT-on metoprolol.  Recommend follow up PCP re: further refills. Stable.   # Seasonal allergies: recommend compliance with anti-histamines; stable.   #  DISPOSITION:  # DISPOSITION: # follow up in 6 months/labs-MD;  cbc/cmp;b12; folic acid-Dr.B     All questions were answered. The patient knows to call the clinic with any problems, questions or concerns.     Earna CoderGovinda R Shatonya Passon, MD 10/26/2022 11:24 AM

## 2022-12-22 ENCOUNTER — Ambulatory Visit: Payer: Self-pay | Admitting: *Deleted

## 2022-12-22 NOTE — Patient Outreach (Signed)
  Care Coordination   Follow Up Visit Note   12/22/2022 Name: Steve Gilbert MRN: [id number] DOB: 10/25/1950  Steve Gilbert is a 72 y.o. year old male who sees Lauro Regulus, MD for primary care. I spoke with  Steve Gilbert by phone today.  What matters to the patients health and wellness today?  Decrease scrotal swelling    Goals Addressed             This Visit's Progress    Effective management of COPD   On track    Care Coordination Interventions: Provided patient with basic written and verbal COPD education on self care/management/and exacerbation prevention Advised patient to track and manage COPD triggers Provided instruction about proper use of medications used for management of COPD including inhalers Advised patient to self assesses COPD action plan zone and make appointment with provider if in the yellow zone for 48 hours without improvement Provided education about and advised patient to utilize infection prevention strategies to reduce risk of respiratory infection Next follow up with PCP in 6 months (last seen on 3/7)      Relief of scrotal swelling       Interventions Today    Flowsheet Row Most Recent Value  Chronic Disease   Chronic disease during today's visit Chronic Obstructive Pulmonary Disease (COPD), Other  [scrotal swelliing, report one testicle the size of a baseball, denies pain or bloody urine]  General Interventions   General Interventions Discussed/Reviewed General Interventions Reviewed, Doctor Visits  [has urostomy due to bladder/prostate cancer.]  Doctor Visits Discussed/Reviewed Doctor Visits Reviewed, PCP  [nephrology in August, PCP visit in September]  PCP/Specialist Visits Compliance with follow-up visit  Education Interventions   Education Provided Provided Education  Provided Verbal Education On Other, When to see the doctor  [Discussed using sling to elevate scrotum at night and supportive underwear during the day.  He will  contact urology if swelling increases or becomes painful]              SDOH assessments and interventions completed:  No     Care Coordination Interventions:  Yes, provided   Follow up plan: Follow up call scheduled for 7/2    Encounter Outcome:  Pt. Visit Completed   Kemper Durie, RN, MSN, [facility] Care Management Care Management Coordinator [phone]

## 2023-01-03 DIAGNOSIS — C678 Malignant neoplasm of overlapping sites of bladder: Secondary | ICD-10-CM | POA: Diagnosis not present

## 2023-01-03 DIAGNOSIS — Z936 Other artificial openings of urinary tract status: Secondary | ICD-10-CM | POA: Diagnosis not present

## 2023-01-03 DIAGNOSIS — N130 Hydronephrosis with ureteropelvic junction obstruction: Secondary | ICD-10-CM | POA: Diagnosis not present

## 2023-01-11 DIAGNOSIS — D234 Other benign neoplasm of skin of scalp and neck: Secondary | ICD-10-CM | POA: Diagnosis not present

## 2023-01-11 DIAGNOSIS — D2339 Other benign neoplasm of skin of other parts of face: Secondary | ICD-10-CM | POA: Diagnosis not present

## 2023-01-11 DIAGNOSIS — L700 Acne vulgaris: Secondary | ICD-10-CM | POA: Diagnosis not present

## 2023-01-17 ENCOUNTER — Encounter: Payer: PPO | Admitting: *Deleted

## 2023-01-26 ENCOUNTER — Ambulatory Visit: Payer: Self-pay | Admitting: *Deleted

## 2023-01-26 NOTE — Patient Outreach (Signed)
  Care Coordination   Follow Up Visit Note   01/26/2023 Name: TOU HAYNER MRN: [id number] DOB: 1951-07-04  ZAYVIER CARAVELLO is a 72 y.o. year old male who sees Lauro Regulus, MD for primary care. I spoke with  [name] by phone today.  What matters to the patients health and wellness today?  Continue relief of scrotal swelling    Goals Addressed             This Visit's Progress    Effective management of COPD   On track    Care Coordination Interventions: Provided patient with basic written and verbal COPD education on self care/management/and exacerbation prevention Advised patient to track and manage COPD triggers Provided instruction about proper use of medications used for management of COPD including inhalers Advised patient to self assesses COPD action plan zone and make appointment with provider if in the yellow zone for 48 hours without improvement Provided education about and advised patient to utilize infection prevention strategies to reduce risk of respiratory infection Next follow up with PCP in 6 months (last seen on 3/7)      Relief of scrotal swelling   On track      Interventions Today    Flowsheet Row Most Recent Value  Chronic Disease   Chronic disease during today's visit Other  [scrotal swelling]  General Interventions   General Interventions Discussed/Reviewed General Interventions Reviewed, Doctor Visits  Doctor Visits Discussed/Reviewed Doctor Visits Reviewed, Specialist, PCP  [Nephrology 8/12, PCP 9/12]  PCP/Specialist Visits Compliance with follow-up visit  Education Interventions   Education Provided Provided Education  Provided Verbal Education On Other  [Report swelling better, state it is a complication of a surgery he had in 2020.  Follows up with urology on a regular basis.  Interventions to help with sweating and swelling]              SDOH assessments and interventions completed:  No     Care Coordination  Interventions:  Yes, provided   Follow up plan: Follow up call scheduled for 9/23    Encounter Outcome:  Pt. Visit Completed   Kemper Durie, RN, MSN, [facility] Care Management Care Management Coordinator [phone]

## 2023-02-20 DIAGNOSIS — N1831 Chronic kidney disease, stage 3a: Secondary | ICD-10-CM | POA: Diagnosis not present

## 2023-02-20 DIAGNOSIS — I10 Essential (primary) hypertension: Secondary | ICD-10-CM | POA: Diagnosis not present

## 2023-03-02 DIAGNOSIS — N130 Hydronephrosis with ureteropelvic junction obstruction: Secondary | ICD-10-CM | POA: Diagnosis not present

## 2023-03-02 DIAGNOSIS — C678 Malignant neoplasm of overlapping sites of bladder: Secondary | ICD-10-CM | POA: Diagnosis not present

## 2023-03-02 DIAGNOSIS — Z936 Other artificial openings of urinary tract status: Secondary | ICD-10-CM | POA: Diagnosis not present

## 2023-03-30 DIAGNOSIS — N1831 Chronic kidney disease, stage 3a: Secondary | ICD-10-CM | POA: Diagnosis not present

## 2023-03-30 DIAGNOSIS — I10 Essential (primary) hypertension: Secondary | ICD-10-CM | POA: Diagnosis not present

## 2023-03-30 DIAGNOSIS — E7800 Pure hypercholesterolemia, unspecified: Secondary | ICD-10-CM | POA: Diagnosis not present

## 2023-03-30 DIAGNOSIS — C675 Malignant neoplasm of bladder neck: Secondary | ICD-10-CM | POA: Diagnosis not present

## 2023-03-30 DIAGNOSIS — J449 Chronic obstructive pulmonary disease, unspecified: Secondary | ICD-10-CM | POA: Diagnosis not present

## 2023-03-30 DIAGNOSIS — Z23 Encounter for immunization: Secondary | ICD-10-CM | POA: Diagnosis not present

## 2023-03-30 DIAGNOSIS — Z0000 Encounter for general adult medical examination without abnormal findings: Secondary | ICD-10-CM | POA: Diagnosis not present

## 2023-03-30 DIAGNOSIS — E1122 Type 2 diabetes mellitus with diabetic chronic kidney disease: Secondary | ICD-10-CM | POA: Diagnosis not present

## 2023-04-11 ENCOUNTER — Other Ambulatory Visit: Payer: Self-pay | Admitting: Internal Medicine

## 2023-04-11 ENCOUNTER — Ambulatory Visit: Payer: Self-pay | Admitting: *Deleted

## 2023-04-11 NOTE — Patient Outreach (Signed)
Care Coordination   04/11/2023 Name: Steve Gilbert MRN: [id number] DOB: 1951-03-05   Care Coordination Outreach Attempts:  An unsuccessful telephone outreach was attempted for a scheduled appointment today.  Follow Up Plan:  Additional outreach attempts will be made to offer the patient care coordination information and services.   Encounter Outcome:  No Answer   Care Coordination Interventions:  No, not indicated    Kemper Durie, RN, MSN, [facility] Care Management Care Management Coordinator [phone]

## 2023-04-11 NOTE — Patient Outreach (Signed)
Care Coordination   Follow Up Visit Note   04/11/2023 Name: Steve Gilbert MRN: [id number] DOB: 1951/03/01  Steve Gilbert is a 72 y.o. year old male who sees Lauro Regulus, MD for primary care. I spoke with  Steve Gilbert by phone today.  What matters to the patients health and wellness today?  Report intermittent shortness of breath, mainly with extreme exertion.  Working on [employer] and exercise for better weight control. Denies any urgent concerns, encouraged to contact this care manager with questions.      Goals Addressed             This Visit's Progress    Effective management of COPD   On track    Care Coordination Interventions: Provided patient with basic written and verbal COPD education on self care/management/and exacerbation prevention Advised patient to track and manage COPD triggers Provided instruction about proper use of medications used for management of COPD including inhalers Advised patient to self assesses COPD action plan zone and make appointment with provider if in the yellow zone for 48 hours without improvement Provided education about and advised patient to utilize infection prevention strategies to reduce risk of respiratory infection      COMPLETED: Relief of scrotal swelling   On track       Interventions Today    Flowsheet Row Most Recent Value  Chronic Disease   Chronic disease during today's visit Chronic Obstructive Pulmonary Disease (COPD), Other  [scrotal sweling, report better]  General Interventions   General Interventions Discussed/Reviewed General Interventions Reviewed, Doctor Visits  Doctor Visits Discussed/Reviewed Doctor Visits Reviewed, PCP, Specialist  [PCP visit done, cancer center next month, nephrology in November]  PCP/Specialist Visits Compliance with follow-up visit  Exercise Interventions   Exercise Discussed/Reviewed Weight Managment  Weight Management Weight loss  [Working on weight loss, does  not have specific number]  Education Interventions   Education Provided Provided Education  Provided Verbal Education On Nutrition, Medication, When to see the doctor  [Was prescribed Ozempic, but state unable to afford.  Does not want pharmacy assistance right now, but state he will think about it for the future]              SDOH assessments and interventions completed:  No     Care Coordination Interventions:  Yes, provided   Follow up plan: Follow up call scheduled for 11/5    Encounter Outcome:  Patient Visit Completed   Kemper Durie, RN, MSN, [facility] Care Management Care Management Coordinator [phone]

## 2023-04-27 ENCOUNTER — Other Ambulatory Visit: Payer: Self-pay

## 2023-04-27 ENCOUNTER — Inpatient Hospital Stay: Payer: PPO | Admitting: Nurse Practitioner

## 2023-04-27 ENCOUNTER — Encounter: Payer: Self-pay | Admitting: Nurse Practitioner

## 2023-04-27 ENCOUNTER — Inpatient Hospital Stay: Payer: PPO | Attending: Internal Medicine

## 2023-04-27 ENCOUNTER — Inpatient Hospital Stay (HOSPITAL_BASED_OUTPATIENT_CLINIC_OR_DEPARTMENT_OTHER): Payer: PPO | Admitting: Nurse Practitioner

## 2023-04-27 DIAGNOSIS — C675 Malignant neoplasm of bladder neck: Secondary | ICD-10-CM | POA: Insufficient documentation

## 2023-04-27 DIAGNOSIS — N1832 Chronic kidney disease, stage 3b: Secondary | ICD-10-CM | POA: Diagnosis not present

## 2023-04-27 DIAGNOSIS — Z7289 Other problems related to lifestyle: Secondary | ICD-10-CM | POA: Diagnosis not present

## 2023-04-27 DIAGNOSIS — Z87891 Personal history of nicotine dependence: Secondary | ICD-10-CM | POA: Diagnosis not present

## 2023-04-27 DIAGNOSIS — Z79899 Other long term (current) drug therapy: Secondary | ICD-10-CM | POA: Diagnosis not present

## 2023-04-27 DIAGNOSIS — M549 Dorsalgia, unspecified: Secondary | ICD-10-CM | POA: Diagnosis not present

## 2023-04-27 DIAGNOSIS — R221 Localized swelling, mass and lump, neck: Secondary | ICD-10-CM

## 2023-04-27 DIAGNOSIS — Z8551 Personal history of malignant neoplasm of bladder: Secondary | ICD-10-CM | POA: Diagnosis not present

## 2023-04-27 DIAGNOSIS — Z833 Family history of diabetes mellitus: Secondary | ICD-10-CM | POA: Insufficient documentation

## 2023-04-27 DIAGNOSIS — Z8249 Family history of ischemic heart disease and other diseases of the circulatory system: Secondary | ICD-10-CM | POA: Diagnosis not present

## 2023-04-27 DIAGNOSIS — M2550 Pain in unspecified joint: Secondary | ICD-10-CM | POA: Diagnosis not present

## 2023-04-27 DIAGNOSIS — Z08 Encounter for follow-up examination after completed treatment for malignant neoplasm: Secondary | ICD-10-CM | POA: Diagnosis not present

## 2023-04-27 DIAGNOSIS — N183 Chronic kidney disease, stage 3 unspecified: Secondary | ICD-10-CM | POA: Insufficient documentation

## 2023-04-27 LAB — CMP (CANCER CENTER ONLY)
ALT: 27 U/L (ref 0–44)
AST: 28 U/L (ref 15–41)
Albumin: 4.2 g/dL (ref 3.5–5.0)
Alkaline Phosphatase: 71 U/L (ref 38–126)
Anion gap: 10 (ref 5–15)
BUN: 28 mg/dL — ABNORMAL HIGH (ref 8–23)
CO2: 25 mmol/L (ref 22–32)
Calcium: 9.4 mg/dL (ref 8.9–10.3)
Chloride: 103 mmol/L (ref 98–111)
Creatinine: 1.75 mg/dL — ABNORMAL HIGH (ref 0.61–1.24)
GFR, Estimated: 41 mL/min — ABNORMAL LOW (ref >60)
Glucose, Bld: 111 mg/dL — ABNORMAL HIGH (ref 70–99)
Potassium: 4 mmol/L (ref 3.5–5.1)
Sodium: 138 mmol/L (ref 135–145)
Total Bilirubin: 0.9 mg/dL (ref 0.3–1.2)
Total Protein: 7.3 g/dL (ref 6.5–8.1)

## 2023-04-27 LAB — FOLATE: Folate: >40 ng/mL (ref >5.9)

## 2023-04-27 LAB — FERRITIN: Ferritin: 39 ng/mL (ref 24–336)

## 2023-04-27 LAB — IRON AND TIBC
Iron: 83 ug/dL (ref 45–182)
Saturation Ratios: 21 % (ref 17.9–39.5)
TIBC: 388 ug/dL (ref 250–450)
UIBC: 305 ug/dL

## 2023-04-27 LAB — CBC (CANCER CENTER ONLY)
HCT: 47 % (ref 39.0–52.0)
Hemoglobin: 16.3 g/dL (ref 13.0–17.0)
MCH: 30.8 pg (ref 26.0–34.0)
MCHC: 34.7 g/dL (ref 30.0–36.0)
MCV: 88.7 fL (ref 80.0–100.0)
Platelet Count: 216 10*3/uL (ref 150–400)
RBC: 5.3 MIL/uL (ref 4.22–5.81)
RDW: 12.8 % (ref 11.5–15.5)
WBC Count: 5.5 10*3/uL (ref 4.0–10.5)
nRBC: 0 % (ref 0.0–0.2)

## 2023-04-27 LAB — VITAMIN B12: Vitamin B-12: 395 pg/mL (ref 180–914)

## 2023-04-27 NOTE — Progress Notes (Signed)
[facility] CONSULT NOTE  Patient Care Team: Lauro Regulus, MD as PCP - General (Internal Medicine) Vanna Scotland, MD as Consulting Physician (Urology) Earna Coder, MD as Consulting Physician (Internal Medicine) Kemper Durie, RN as [facility] Care Management  CHIEF COMPLAINTS/PURPOSE OF CONSULTATION:  Bladder cancer  Oncology History Overview Note  # BLADDER CANCER: pT2/cpT4  prostate/bladder neck Dr.Brandon TURP s/p  - INVASIVE UROTHELIAL CARCINOMA, HIGH-GRADE;  TUMOR INVADES BLADDER NECK MUSCULARIS PROPRIA. Mild-mod Right hydronephrosis [facility] RISK FEATURES]   # OCt 9t ddMVAC x4; Jan 24th- 2021 cystectomy [Dr.Manny; [place]] ypTis ypN1; stage III suriveillaince  # Feb 2020- UTI/sepsis- ICU  x3 weeks IV antibiotics/ s-p port removal.   # 2 Echo- 50% to 55%. [oct 2019]  # COPD/ not on O2; [quit 2009];    DIAGNOSIS: BLADDER CA  STAGE: Stage III;GOALS: cure  CURRENT/MOST RECENT THERAPY : Surveillance    Cancer of bladder neck (HCC)    HISTORY OF PRESENTING ILLNESS: Alone.  Ambulating independently.  Steve Gilbert 73 y.o. male with muscle invasive bladder cancer status post neoadjuvant chemotherapy, followed by cystectomy currently on surveillance is here for follow-up..  Patient here for oncology follow-up appointment.  patient concerned of blood tinged mucous and headaches. Complains of seasonal allergies.   Patient denies any new onset of back pain or joint pains. Denies any shortness of breath or cough no bone pain.  No nausea no vomiting. No worsening shortness of breath or chest pain.  Review of Systems  Constitutional:  Negative for chills, diaphoresis, fever and weight loss.  HENT:  Negative for nosebleeds and sore throat.   Eyes:  Negative for double vision.  Cardiovascular:  Negative for chest pain, palpitations, orthopnea and leg swelling.  Gastrointestinal:  Negative for abdominal pain, blood in stool,  constipation, diarrhea, heartburn, melena, nausea and vomiting.  Genitourinary:  Negative for dysuria, frequency and urgency.  Musculoskeletal:  Positive for back pain and joint pain.  Neurological:  Negative for dizziness, tingling, focal weakness, weakness and headaches.  Endo/Heme/Allergies:  Does not bruise/bleed easily.  Psychiatric/Behavioral:  Negative for depression. The patient is not nervous/anxious and does not have insomnia.      MEDICAL HISTORY:  Past Medical History:  Diagnosis Date   Acid reflux    Allergy    Arthritis    both hands   Cancer of bladder neck (HCC) 02/2018   Chemo tx's and surgical resection.   Chronic kidney disease    Complication of anesthesia    behaviors combative   COPD (chronic obstructive pulmonary disease) (HCC)    High cholesterol    History of kidney stones    Hyperlipidemia 04/03/2018   Hypertension    Pneumonia    Pre-diabetes     SURGICAL HISTORY: Past Surgical History:  Procedure Laterality Date   bladder removed  2020   CYSTOSCOPY W/ URETERAL STENT REMOVAL Right 08/10/2018   Procedure: CYSTOSCOPY WITH STENT REMOVAL;  Surgeon: Sebastian Ache, MD;  Location: [facility];  Service: Urology;  Laterality: Right;   CYSTOSCOPY WITH BIOPSY N/A 04/04/2018   Procedure: CYSTOSCOPY WITH TURBT;  Surgeon: Vanna Scotland, MD;  Location: [facility];  Service: Urology;  Laterality: N/A;   CYSTOSCOPY WITH URETEROSCOPY AND STENT PLACEMENT Right 04/04/2018   Procedure: CYSTOSCOPY WITH URETEROSCOPY AND STENT PLACEMENT;  Surgeon: Vanna Scotland, MD;  Location: [facility];  Service: Urology;  Laterality: Right;   HERNIA REPAIR     LAPAROTOMY N/A 04/24/2021   Procedure: EXPLORATORY LAPAROTOMY; PARASTOMAL HERNIA  REPAIR Sugerbaker technique  incisional hernia repair, right sided TAR;  Surgeon: Stechschulte, Hyman Hopes, MD;  Location: [facility];  Service: General;  Laterality: N/A;   NASAL SINUS SURGERY     PORTA CATH INSERTION N/A 04/23/2018   Procedure: PORTA CATH  INSERTION;  Surgeon: Annice Needy, MD;  Location: [facility];  Service: Cardiovascular;  Laterality: N/A;   PORTA CATH REMOVAL N/A 09/04/2018   Procedure: PORTA CATH REMOVAL;  Surgeon: Renford Dills, MD;  Location: [facility];  Service: Cardiovascular;  Laterality: N/A;   SUBMANDIBULAR GLAND EXCISION Left 06/22/2022   Procedure: EXCISION SUBMANDIBULAR GLAND;  Surgeon: Geanie Logan, MD;  Location: [facility];  Service: ENT;  Laterality: Left;   TEE WITHOUT CARDIOVERSION N/A 09/05/2018   Procedure: TRANSESOPHAGEAL ECHOCARDIOGRAM (TEE);  Surgeon: Antonieta Iba, MD;  Location: [facility];  Service: Cardiovascular;  Laterality: N/A;   TRANSURETHRAL RESECTION OF PROSTATE N/A 04/04/2018   Procedure: TRANSURETHRAL RESECTION OF THE PROSTATE (TURP);  Surgeon: Vanna Scotland, MD;  Location: [facility];  Service: Urology;  Laterality: N/A;   URETERAL BIOPSY Right 04/04/2018   Procedure: URETERAL BIOPSY;  Surgeon: Vanna Scotland, MD;  Location: [facility];  Service: Urology;  Laterality: Right;     SOCIAL HISTORY:  Social History   Socioeconomic History   Marital status: Widowed    Spouse name: Not on file   Number of children: Not on file   Years of education: Not on file   Highest education level: Not on file  Occupational History   Not on file  Tobacco Use   Smoking status: Former    Current packs/day: 0.00    Average packs/day: 1.5 packs/day for 30.0 years (45.0 ttl pk-yrs)    Types: Cigarettes    Start date: 45    Quit date: 2009    Years since quitting: 15.7   Smokeless tobacco: Former  Building services engineer status: Never Used  Substance and Sexual Activity   Alcohol use: Not Currently    Alcohol/week: 7.0 standard drinks of alcohol    Types: 5 Cans of beer, 2 Standard drinks or equivalent per week    Comment: ocassional    Drug use: Not Currently   Sexual activity: Not Currently  Other Topics Concern   Not on file  Social History Narrative   currently  retired.  Works part-time.  Remote history of smoking.  No alcohol.   Social Determinants of Health   Financial Resource Strain: Low Risk  (03/30/2023)   Received from [facility]   Overall Financial Resource Strain (CARDIA)    Difficulty of Paying Living Expenses: Not hard at all  Food Insecurity: No Food Insecurity (03/30/2023)   [facility] from [facility]   Hunger Vital Sign    Worried About Running Out of Food in the Last Year: Never true    Ran Out of Food in the Last Year: Never true  Transportation Needs: No Transportation Needs (03/30/2023)   Received from [facility]    In the past 12 months, has lack of transportation kept you from medical appointments or from getting medications?: No    Lack of Transportation (Non-Medical): No  Physical Activity: Unknown (08/09/2018)   Exercise Vital Sign    Days of Exercise per Week: Patient declined    Minutes of Exercise per Session: Patient declined  Stress: No Stress Concern Present (08/09/2018)   [employer] of Occupational Health -  Occupational Stress Questionnaire    Feeling of Stress : Not at all  Social Connections: Unknown (08/09/2018)   Social Connection and Isolation Panel [NHANES]    Frequency of Communication with Friends and Family: Patient declined    Frequency of Social Gatherings with Friends and Family: Patient declined    Attends Religious Services: Patient declined    Database administrator or Organizations: Patient declined    Attends Banker Meetings: Patient declined    Marital Status: Patient declined  Intimate Partner Violence: Unknown (08/09/2018)   Humiliation, Afraid, Rape, and Kick questionnaire    Fear of Current or Ex-Partner: Patient declined    Emotionally Abused: Patient declined    Physically Abused: Patient declined    Sexually Abused: Patient declined    FAMILY HISTORY: Family History  Problem  Relation Age of Onset   Diabetes Mother    Heart disease Father    Diabetes Brother    Heart attack Brother    Heart disease Brother     ALLERGIES:  is allergic to iodinated contrast media.  MEDICATIONS:  Current Outpatient Medications  Medication Sig Dispense Refill   albuterol (VENTOLIN HFA) 108 (90 Base) MCG/ACT inhaler Inhale into the lungs.     amLODipine (NORVASC) 10 MG tablet TAKE 1 TABLET(10 MG) BY MOUTH EVERY DAY     aspirin EC 81 MG tablet Take 1 tablet by mouth daily.     atorvastatin (LIPITOR) 20 MG tablet Take 20 mg by mouth daily.     azelastine (ASTELIN) 0.1 % nasal spray Place 1 spray into both nostrils 2 (two) times daily.     chlorthalidone (HYGROTON) 25 MG tablet Take by mouth.     fluticasone-salmeterol (ADVAIR) 250-50 MCG/ACT AEPB Inhale 1 puff into the lungs 2 (two) times daily.     metoprolol tartrate (LOPRESSOR) 25 MG tablet TAKE 1 TABLET BY MOUTH TWICE DAILY 60 tablet 3   Multiple Vitamins-Minerals (MULTIVITAMIN WITH MINERALS) tablet Take 1 tablet by mouth daily.     pantoprazole (PROTONIX) 40 MG tablet Take 40 mg by mouth every evening.      pseudoephedrine (SUDAFED) 30 MG tablet Take 30 mg by mouth every 4 (four) hours as needed for congestion.     tiotropium (SPIRIVA HANDIHALER) 18 MCG inhalation capsule Place 18 mcg into inhaler and inhale daily.     No current facility-administered medications for this visit.        PHYSICAL EXAMINATION: ECOG PERFORMANCE STATUS: 1 - Symptomatic but completely ambulatory  There were no vitals filed for this visit.  There were no vitals filed for this visit.   Physical Exam Constitutional:      Comments: He is alone.  Walking himself.  HENT:     Head: Normocephalic and atraumatic.     Mouth/Throat:     Pharynx: No oropharyngeal exudate.  Eyes:     Pupils: Pupils are equal, round, and reactive to light.  Cardiovascular:     Rate and Rhythm: Normal rate and regular rhythm.  Pulmonary:     Effort: No  respiratory distress.     Breath sounds: Normal breath sounds.  Abdominal:     General: Bowel sounds are normal. There is no distension.     Palpations: Abdomen is soft. There is no mass.     Tenderness: There is no abdominal tenderness. There is no guarding or rebound.     Comments: Urostomy.   Musculoskeletal:        General: No tenderness. Normal range  of motion.     Cervical back: Normal range of motion and neck supple.  Skin:    General: Skin is warm.  Neurological:     Mental Status: He is alert and oriented to person, place, and time.  Psychiatric:        Mood and Affect: Affect normal.      LABORATORY DATA:  I have reviewed the data as listed Lab Results  Component Value Date   WBC 5.4 10/26/2022   HGB 14.6 10/26/2022   HCT 44.0 10/26/2022   MCV 92.8 10/26/2022   PLT 267 10/26/2022   Recent Labs    10/26/22 1023  NA 140  K 4.9  CL 107  CO2 27  GLUCOSE 139*  BUN 20  CREATININE 1.90*  CALCIUM 9.0  GFRNONAA 37*  PROT 7.3  ALBUMIN 4.0  AST 22  ALT 18  ALKPHOS 76  BILITOT 0.9    RADIOGRAPHIC STUDIES: I have personally reviewed the radiological images as listed and agreed with the findings in the report. No results found.  ASSESSMENT & PLAN:   #Transitional cell bladder cancer - s/p 4 cycles of neoadjuvant dose dense MVAC chemotherapy followed by cystectomy Jan 2021. NED since. Sep 2023- new ill-defined focal soft tissue, located adjacent to the wall of a distal small bowel loop with surrounding fat stranding. Oct 2023 PET- NED.      neo-adjuvant chemo-s/p 4 cycles of dose dense MVAC; status post cystectomy [JAN 2021]. SEP 2023: New ill-defined focal soft tissue located adjacent to the wall of a distal small bowel loop with surrounding fat stranding. PET scan OCT 2023-no evidence of metastatic disease noted. Stable.    #DEC 2023- Left submandibular nodule-PET avid-s/p ultrasound-guided biopsy-benign adenoid tumor-  re: Excision s/p referral- s/p  resection- Left; Resection:  Pleomorphic Adenoma. Negative for Malignancy. Reviewed with patient.    # Chronic kidney disease-stage III- GFR- 37- stable-  [Dr.M; [facility]]; continue follow up with nephology.    # HNT-on metoprolol.  Recommend follow up PCP re: further refills. Stable.    # Seasonal allergies: recommend compliance with anti-histamines; stable.    # DISPOSITION: 6 mo- labs (cbc, cmp, b12, folate, ferritin, iron), Dr Donneta Romberg- [place]     No problem-specific Assessment & Plan notes found for this encounter.    All questions were answered. The patient knows to call the clinic with any problems, questions or concerns.   Alinda Dooms, NP 04/27/2023

## 2023-05-08 ENCOUNTER — Other Ambulatory Visit: Payer: Self-pay | Admitting: Internal Medicine

## 2023-05-08 DIAGNOSIS — C675 Malignant neoplasm of bladder neck: Secondary | ICD-10-CM

## 2023-05-12 DIAGNOSIS — N130 Hydronephrosis with ureteropelvic junction obstruction: Secondary | ICD-10-CM | POA: Diagnosis not present

## 2023-05-12 DIAGNOSIS — Z936 Other artificial openings of urinary tract status: Secondary | ICD-10-CM | POA: Diagnosis not present

## 2023-05-12 DIAGNOSIS — C678 Malignant neoplasm of overlapping sites of bladder: Secondary | ICD-10-CM | POA: Diagnosis not present

## 2023-05-23 ENCOUNTER — Ambulatory Visit: Payer: Self-pay | Admitting: *Deleted

## 2023-05-23 NOTE — Patient Outreach (Signed)
  Care Coordination   Follow Up Visit Note   05/23/2023 Name: Steve Gilbert MRN: [id number] DOB: 17-Oct-1950  Steve Gilbert is a 72 y.o. year old male who sees Lauro Regulus, MD for primary care. I spoke with  Steve Gilbert by phone today.  What matters to the patients health and wellness today?  Patient state he is doing well, still working on increasing exercise and weight loss.  Denies any urgent concerns, encouraged to contact this care manager with questions.      Goals Addressed             This Visit's Progress    COMPLETED: Effective management of COPD   On track    Care Coordination Interventions: Provided patient with basic written and verbal COPD education on self care/management/and exacerbation prevention Advised patient to track and manage COPD triggers Provided instruction about proper use of medications used for management of COPD including inhalers Advised patient to self assesses COPD action plan zone and make appointment with provider if in the yellow zone for 48 hours without improvement Provided education about and advised patient to utilize infection prevention strategies to reduce risk of respiratory infection         SDOH assessments and interventions completed:  No     Care Coordination Interventions:  Yes, provided   Interventions Today    Flowsheet Row Most Recent Value  Chronic Disease   Chronic disease during today's visit Chronic Obstructive Pulmonary Disease (COPD), Hypertension (HTN)  General Interventions   General Interventions Discussed/Reviewed General Interventions Reviewed, Doctor Visits  Doctor Visits Discussed/Reviewed Doctor Visits Reviewed, PCP  [upcoming with PCP on 3/13 and urology  on 1/2]  PCP/Specialist Visits Compliance with follow-up visit  Exercise Interventions   Exercise Discussed/Reviewed Weight Managment  Weight Management Weight loss  Education Interventions   Education Provided Provided Education   Provided Verbal Education On Medication, When to see the doctor, Exercise       Follow up plan: No further intervention required.   Encounter Outcome:  Patient Visit Completed   Kemper Durie RN, MSN, CCM [place]  [facility], [facility] RN Care Coordinator Direct Dial: [phone] / Main [phone] Fax [phone] Email: [email or web address]: [email or web address]

## 2023-06-05 ENCOUNTER — Encounter: Payer: Self-pay | Admitting: Internal Medicine

## 2023-06-05 NOTE — Progress Notes (Signed)
[facility] CONSULT NOTE  Patient Care Team: Lauro Regulus, MD as PCP - General (Internal Medicine) Vanna Scotland, MD as Consulting Physician (Urology) Earna Coder, MD as Consulting Physician (Internal Medicine) Rodney Langton, RN as [facility] Care Management  Virtual Visit Progress Note  I connected with Steve Gilbert on 04/27/23 at 11:00 AM EDT by video enabled telemedicine visit and verified that I am speaking with the correct person using two identifiers.   I discussed the limitations, risks, security and privacy concerns of performing an evaluation and management service by telemedicine and the availability of in-person appointments. I also discussed with the patient that there may be a patient responsible charge related to this service. The patient expressed understanding and agreed to proceed.   Other persons participating in the visit and their role in the encounter: none   Patient's location: home  Provider's location: clinic   CHIEF COMPLAINTS/PURPOSE OF CONSULTATION:  Bladder cancer  Oncology History Overview Note  # BLADDER CANCER: pT2/cpT4  prostate/bladder neck Dr.Brandon TURP s/p  - INVASIVE UROTHELIAL CARCINOMA, HIGH-GRADE;  TUMOR INVADES BLADDER NECK MUSCULARIS PROPRIA. Mild-mod Right hydronephrosis [facility] RISK FEATURES]   # OCt 9t ddMVAC x4; Jan 24th- 2021 cystectomy [Dr.Manny; [place]] ypTis ypN1; stage III suriveillaince  # Feb 2020- UTI/sepsis- ICU  x3 weeks IV antibiotics/ s-p port removal.   # 2 Echo- 50% to 55%. [oct 2019]  # COPD/ not on O2; [quit 2009];    DIAGNOSIS: BLADDER CA  STAGE: Stage III;GOALS: cure  CURRENT/MOST RECENT THERAPY : Surveillance    Cancer of bladder neck (HCC)    HISTORY OF PRESENTING ILLNESS:   Steve Gilbert 72 y.o. male with muscle invasive bladder cancer status post neoadjuvant chemotherapy, followed by cystectomy currently on surveillance, who agrees to follow up via  telemedicine. He previously complained of neck smelling and underwent imaging and biopsy which was benign. Symptoms are stable. Today, he complains of fatigue and generalized malaise. He had RSV earlier this year with chronic cough that took time to resolve. Denies hematuria. No new shortness of breath, chest pain, or bone pain. Denies unintentional weight loss. Denies pelvic pain.   Review of Systems  Constitutional:  Negative for chills, diaphoresis, fever and weight loss.  HENT:  Negative for nosebleeds and sore throat.   Eyes:  Negative for double vision.  Cardiovascular:  Negative for chest pain, palpitations, orthopnea and leg swelling.  Gastrointestinal:  Negative for abdominal pain, blood in stool, constipation, diarrhea, heartburn, melena, nausea and vomiting.  Genitourinary:  Negative for dysuria, frequency and urgency.  Musculoskeletal:  Positive for back pain and joint pain.  Neurological:  Negative for dizziness, tingling, focal weakness, weakness and headaches.  Endo/Heme/Allergies:  Does not bruise/bleed easily.  Psychiatric/Behavioral:  Negative for depression. The patient is not nervous/anxious and does not have insomnia.      MEDICAL HISTORY:  Past Medical History:  Diagnosis Date   Acid reflux    Allergy    Arthritis    both hands   Cancer of bladder neck (HCC) 02/2018   Chemo tx's and surgical resection.   Chronic kidney disease    Complication of anesthesia    behaviors combative   COPD (chronic obstructive pulmonary disease) (HCC)    High cholesterol    History of kidney stones    Hyperlipidemia 04/03/2018   Hypertension    Pneumonia    Pre-diabetes     SURGICAL HISTORY: Past Surgical History:  Procedure Laterality Date  bladder removed  2020   CYSTOSCOPY W/ URETERAL STENT REMOVAL Right 08/10/2018   Procedure: CYSTOSCOPY WITH STENT REMOVAL;  Surgeon: Sebastian Ache, MD;  Location: [facility];  Service: Urology;  Laterality: Right;   CYSTOSCOPY WITH BIOPSY  N/A 04/04/2018   Procedure: CYSTOSCOPY WITH TURBT;  Surgeon: Vanna Scotland, MD;  Location: [facility];  Service: Urology;  Laterality: N/A;   CYSTOSCOPY WITH URETEROSCOPY AND STENT PLACEMENT Right 04/04/2018   Procedure: CYSTOSCOPY WITH URETEROSCOPY AND STENT PLACEMENT;  Surgeon: Vanna Scotland, MD;  Location: [facility];  Service: Urology;  Laterality: Right;   HERNIA REPAIR     LAPAROTOMY N/A 04/24/2021   Procedure: EXPLORATORY LAPAROTOMY; PARASTOMAL HERNIA REPAIR Sugerbaker technique  incisional hernia repair, right sided TAR;  Surgeon: Stechschulte, Hyman Hopes, MD;  Location: [facility];  Service: General;  Laterality: N/A;   NASAL SINUS SURGERY     PORTA CATH INSERTION N/A 04/23/2018   Procedure: PORTA CATH INSERTION;  Surgeon: Annice Needy, MD;  Location: [facility];  Service: Cardiovascular;  Laterality: N/A;   PORTA CATH REMOVAL N/A 09/04/2018   Procedure: PORTA CATH REMOVAL;  Surgeon: Renford Dills, MD;  Location: [facility];  Service: Cardiovascular;  Laterality: N/A;   SUBMANDIBULAR GLAND EXCISION Left 06/22/2022   Procedure: EXCISION SUBMANDIBULAR GLAND;  Surgeon: Geanie Logan, MD;  Location: [facility];  Service: ENT;  Laterality: Left;   TEE WITHOUT CARDIOVERSION N/A 09/05/2018   Procedure: TRANSESOPHAGEAL ECHOCARDIOGRAM (TEE);  Surgeon: Antonieta Iba, MD;  Location: [facility];  Service: Cardiovascular;  Laterality: N/A;   TRANSURETHRAL RESECTION OF PROSTATE N/A 04/04/2018   Procedure: TRANSURETHRAL RESECTION OF THE PROSTATE (TURP);  Surgeon: Vanna Scotland, MD;  Location: [facility];  Service: Urology;  Laterality: N/A;   URETERAL BIOPSY Right 04/04/2018   Procedure: URETERAL BIOPSY;  Surgeon: Vanna Scotland, MD;  Location: [facility];  Service: Urology;  Laterality: Right;     SOCIAL HISTORY:  Social History   Socioeconomic History   Marital status: Widowed    Spouse name: Not on file   Number of children: Not on file   Years of education: Not on file    Highest education level: Not on file  Occupational History   Not on file  Tobacco Use   Smoking status: Former    Current packs/day: 0.00    Average packs/day: 1.5 packs/day for 30.0 years (45.0 ttl pk-yrs)    Types: Cigarettes    Start date: 79    Quit date: 2009    Years since quitting: 15.8   Smokeless tobacco: Former  Building services engineer status: Never Used  Substance and Sexual Activity   Alcohol use: Not Currently    Alcohol/week: 7.0 standard drinks of alcohol    Types: 5 Cans of beer, 2 Standard drinks or equivalent per week    Comment: ocassional    Drug use: Not Currently   Sexual activity: Not Currently  Other Topics Concern   Not on file  Social History Narrative   currently retired.  Works part-time.  Remote history of smoking.  No alcohol.   Social Determinants of Health   Financial Resource Strain: Low Risk  (03/30/2023)   Received from [facility]   Overall Financial Resource Strain (CARDIA)    Difficulty of Paying Living Expenses: Not hard at all  Food Insecurity: No Food Insecurity (03/30/2023)   Received from [facility]   Hunger Vital Sign    Worried About Running Out  of Food in the Last Year: Never true    Ran Out of Food in the Last Year: Never true  Transportation Needs: No Transportation Needs (03/30/2023)   Received from [facility] - Transportation    In the past 12 months, has lack of transportation kept you from medical appointments or from getting medications?: No    Lack of Transportation (Non-Medical): No  Physical Activity: Unknown (08/09/2018)   Exercise Vital Sign    Days of Exercise per Week: Patient declined    Minutes of Exercise per Session: Patient declined  Stress: No Stress Concern Present (08/09/2018)   [employer] of Occupational Health - Occupational Stress Questionnaire    Feeling of Stress : Not at all  Social Connections: Unknown (08/09/2018)   Social  Connection and Isolation Panel [NHANES]    Frequency of Communication with Friends and Family: Patient declined    Frequency of Social Gatherings with Friends and Family: Patient declined    Attends Religious Services: Patient declined    Database administrator or Organizations: Patient declined    Attends Banker Meetings: Patient declined    Marital Status: Patient declined  Intimate Partner Violence: Unknown (08/09/2018)   Humiliation, Afraid, Rape, and Kick questionnaire    Fear of Current or Ex-Partner: Patient declined    Emotionally Abused: Patient declined    Physically Abused: Patient declined    Sexually Abused: Patient declined    FAMILY HISTORY: Family History  Problem Relation Age of Onset   Diabetes Mother    Heart disease Father    Diabetes Brother    Heart attack Brother    Heart disease Brother     ALLERGIES:  is allergic to iodinated contrast media.  MEDICATIONS:  Current Outpatient Medications  Medication Sig Dispense Refill   hydrochlorothiazide (HYDRODIURIL) 12.5 MG tablet Take 1 tablet by mouth daily.     Semaglutide,0.25 or 0.5MG /DOS, 2 MG/3ML SOPN Inject into the skin.     albuterol (VENTOLIN HFA) 108 (90 Base) MCG/ACT inhaler Inhale into the lungs.     amLODipine (NORVASC) 10 MG tablet TAKE 1 TABLET(10 MG) BY MOUTH EVERY DAY     aspirin EC 81 MG tablet Take 1 tablet by mouth daily.     atorvastatin (LIPITOR) 20 MG tablet Take 20 mg by mouth daily.     azelastine (ASTELIN) 0.1 % nasal spray Place 1 spray into both nostrils 2 (two) times daily.     chlorthalidone (HYGROTON) 25 MG tablet Take by mouth.     fluticasone-salmeterol (ADVAIR) 250-50 MCG/ACT AEPB Inhale 1 puff into the lungs 2 (two) times daily.     losartan-hydrochlorothiazide (HYZAAR) 100-12.5 MG tablet Take 1 tablet by mouth daily.     metoprolol tartrate (LOPRESSOR) 25 MG tablet TAKE 1 TABLET BY MOUTH TWICE DAILY 60 tablet 3   Multiple Vitamins-Minerals (MULTIVITAMIN WITH  MINERALS) tablet Take 1 tablet by mouth daily.     pantoprazole (PROTONIX) 40 MG tablet Take 40 mg by mouth every evening.      Probiotic Product (RISAQUAD PO) TK 1 C PO QD     pseudoephedrine (SUDAFED) 30 MG tablet Take 30 mg by mouth every 4 (four) hours as needed for congestion.     tiotropium (SPIRIVA HANDIHALER) 18 MCG inhalation capsule Place 18 mcg into inhaler and inhale daily.     No current facility-administered medications for this visit.    PHYSICAL EXAMINATION: ECOG PERFORMANCE STATUS: 1 - Symptomatic but completely ambulatory There  were no vitals filed for this visit. There were no vitals filed for this visit.  Physical Exam Constitutional:      Appearance: He is not ill-appearing.     Comments: He is alone.  Walking himself.  HENT:     Head: Normocephalic.  Pulmonary:     Effort: No respiratory distress.  Abdominal:     Comments: Urostomy.   Neurological:     Mental Status: He is alert and oriented to person, place, and time.  Psychiatric:        Mood and Affect: Mood and affect normal.        Behavior: Behavior normal.     LABORATORY DATA:  I have reviewed the data as listed Lab Results  Component Value Date   WBC 5.5 04/27/2023   HGB 16.3 04/27/2023   HCT 47.0 04/27/2023   MCV 88.7 04/27/2023   PLT 216 04/27/2023   Recent Labs    10/26/22 1023 04/27/23 1021  NA 140 138  K 4.9 4.0  CL 107 103  CO2 27 25  GLUCOSE 139* 111*  BUN 20 28*  CREATININE 1.90* 1.75*  CALCIUM 9.0 9.4  GFRNONAA 37* 41*  PROT 7.3 7.3  ALBUMIN 4.0 4.2  AST 22 28  ALT 18 27  ALKPHOS 76 71  BILITOT 0.9 0.9   Iron/TIBC/Ferritin/ %Sat    Component Value Date/Time   IRON 83 04/27/2023 1021   TIBC 388 04/27/2023 1021   FERRITIN 39 04/27/2023 1021   IRONPCTSAT 21 04/27/2023 1021    RADIOGRAPHIC STUDIES: I have personally reviewed the radiological images as listed and agreed with the findings in the report. No results found.  ASSESSMENT & PLAN:   #Transitional  cell bladder cancer - Muscle invasive bladder cancer- Stage III. s/p 4 cycles of neoadjuvant dose dense MVAC chemotherapy followed by cystectomy Jan 2021 with Dr. Berneice Heinrich, [place]. ypTis ypN1. NED since. Sep 2023- new ill-defined focal soft tissue, located adjacent to the wall of a distal small bowel loop with surrounding fat stranding. Oct 2023 PET- NED. Clinically, asymptomatic of recurrence. Continue abdomen/pelvis CT or MRI annually. CT Chest annually. Consider PET if metastatic disease suspected. Continue to monitor renal function, LFTs and B12 annually. He will have urine cytology and washings per urology or as clinically indicated. Plan for CT abdomen pelvis wo contrast. Continue follow up with Dr. Donneta Romberg in 6 months.   #DEC 2023- Left submandibular nodule-PET avid-s/p ultrasound-guided biopsy-benign adenoid tumor-  re: Excision s/p referral- s/p resection- Left; Resection:  Pleomorphic Adenoma. Negative for Malignancy. Reviewed with patient.    # Chronic kidney disease- stage III- GFR- 37. Today, GFR 41. Cr 1.75. Followed by [facility] nephrology.  Iron studies checked today d/t fatigue. No evidence of anemia and iron stores are well replenished. B12 and folate are normal.    # HNT- on metoprolol.  Recommend follow up PCP re: further refills. Started on hydrochlorothiazide by nephrology. Encouraged BP log.    # Seasonal allergies: continue otc antihistamines- nondrowsy formulas such as claritin vs zyrtec vs allegra. stable.    # DISPOSITION: CT Abdomen pelvis wo contrast for surveillance/monitoring.  6 mo- labs (cbc, cmp, b12, folate, ferritin, iron), Dr Donneta Romberg- [place]     I discussed the assessment and treatment plan with the patient. The patient was provided an opportunity to ask questions and all were answered. The patient agreed with the plan and demonstrated an understanding of the instructions.   The patient was advised to call back or seek an  in-person evaluation if the symptoms worsen or if  the condition fails to improve as anticipated.   I spent 25 minutes face-to-face video visit time dedicated to the care of this patient on the date of this encounter to include pre-visit review of NCCN guidelines, prior notes, interval notes, face-to-face time with the patient, and post visit ordering of testing/documentation.   No problem-specific Assessment & Plan notes found for this encounter.  Alinda Dooms, NP

## 2023-06-12 ENCOUNTER — Other Ambulatory Visit: Payer: PPO

## 2023-06-13 ENCOUNTER — Ambulatory Visit
Admission: RE | Admit: 2023-06-13 | Discharge: 2023-06-13 | Disposition: A | Discharge status: Home / Self Care | Payer: PPO | Source: Ambulatory Visit | Attending: Nurse Practitioner | Admitting: Nurse Practitioner

## 2023-06-13 DIAGNOSIS — Z08 Encounter for follow-up examination after completed treatment for malignant neoplasm: Secondary | ICD-10-CM | POA: Insufficient documentation

## 2023-06-13 DIAGNOSIS — Z8551 Personal history of malignant neoplasm of bladder: Secondary | ICD-10-CM | POA: Diagnosis not present

## 2023-06-13 DIAGNOSIS — N1832 Chronic kidney disease, stage 3b: Secondary | ICD-10-CM | POA: Diagnosis not present

## 2023-06-13 DIAGNOSIS — C679 Malignant neoplasm of bladder, unspecified: Secondary | ICD-10-CM | POA: Diagnosis not present

## 2023-06-13 DIAGNOSIS — N2889 Other specified disorders of kidney and ureter: Secondary | ICD-10-CM | POA: Diagnosis not present

## 2023-06-13 DIAGNOSIS — K5730 Diverticulosis of large intestine without perforation or abscess without bleeding: Secondary | ICD-10-CM | POA: Diagnosis not present

## 2023-06-27 DIAGNOSIS — N130 Hydronephrosis with ureteropelvic junction obstruction: Secondary | ICD-10-CM | POA: Diagnosis not present

## 2023-06-27 DIAGNOSIS — Z936 Other artificial openings of urinary tract status: Secondary | ICD-10-CM | POA: Diagnosis not present

## 2023-06-27 DIAGNOSIS — C678 Malignant neoplasm of overlapping sites of bladder: Secondary | ICD-10-CM | POA: Diagnosis not present

## 2023-06-27 DIAGNOSIS — J441 Chronic obstructive pulmonary disease with (acute) exacerbation: Secondary | ICD-10-CM | POA: Diagnosis not present

## 2023-07-20 ENCOUNTER — Encounter: Payer: Self-pay | Admitting: Urology

## 2023-07-20 ENCOUNTER — Ambulatory Visit: Payer: PPO | Admitting: Urology

## 2023-07-20 VITALS — BP 121/82 | HR 76 | Ht 66.0 in | Wt 225.0 lb

## 2023-07-20 DIAGNOSIS — Z8551 Personal history of malignant neoplasm of bladder: Secondary | ICD-10-CM

## 2023-07-20 DIAGNOSIS — N432 Other hydrocele: Secondary | ICD-10-CM

## 2023-07-20 DIAGNOSIS — C679 Malignant neoplasm of bladder, unspecified: Secondary | ICD-10-CM

## 2023-07-20 NOTE — Patient Instructions (Signed)
Hydrocele, Adult A hydrocele is a collection of fluid in the loose pouch of skin that holds the testicles (scrotum). It can occur in one or both testicles. This may happen because: The amount of fluid produced in the scrotum is not absorbed by the rest of the body. Fluid from the abdomen fills the scrotum. Normally, the testicles develop in the abdomen and then drop into the scrotum before birth. The tube that the testicles travel through usually closes after the testicles drop. If the tube does not close, fluid from the abdomen can fill the scrotum. This is not very common in adults. What are the causes? A hydrocele may be caused by: An injury to the scrotum. An infection. Decreased blood flow to the scrotum. Twisting of a testicle (testicular torsion). A birth defect. A tumor or cancer of the testicle. Sometimes, the cause is not known. What are the signs or symptoms? A hydrocele feels like a water-filled balloon. It may also feel heavy. Other symptoms include: Swelling of the scrotum. The swelling may decrease when you lie down. You may also notice more swelling at night than in the morning. This is called a communicating hydrocele, in which the fluid in the scrotum goes back into the abdominal cavity when the position of the scrotum changes. Swelling of the groin. Mild discomfort in the scrotum. Pain. This can develop if the hydrocele was caused by infection or twisting. The larger the hydrocele, the more likely you are to have pain. Swelling may also cause pain. How is this diagnosed? This condition may be diagnosed based on a physical exam and your medical history. You may also have tests, including: Imaging tests, such as an ultrasound. A transillumination test. This test takes place in a dark room where a light is placed on the skin of the scrotum. Clear liquid will not impede the light and the scrotum will be illuminated. This helps a health care provider distinguish a hydrocele from a  tumor. Blood or urine tests. How is this treated? Most hydroceles go away on their own. If you have no discomfort or pain, your health care provider may suggest close monitoring of your condition until the condition goes away or symptoms develop. This is called watch and wait or watchful waiting. If treatment is needed, it may include: Treating an underlying condition. This may include taking an antibiotic medicine to treat an infection. Having surgery to stop fluid from collecting in the scrotum. Having surgery to drain the fluid. Surgery may include: Hydrocelectomy. For this procedure, an incision is made in the scrotum to remove the fluid. Needle aspiration. A needle is used to drain fluid. However, the fluid buildup will come back quickly and may lead to an infection of the scrotum. This is rarely done. Follow these instructions at home: Medicines Take over-the-counter and prescription medicines only as told by your health care provider. If you were prescribed an antibiotic medicine, take it as told by your health care provider. Do not stop taking the antibiotic even if you start to feel better. General instructions Watch the hydrocele for any changes. Keep all follow-up visits. This is important. Contact a health care provider if: You notice any changes in the hydrocele. The swelling in your scrotum or groin gets worse. The hydrocele becomes red, firm, painful, or tender to the touch. You have a fever. Get help right away if you: Develop a lot of pain or your pain becomes worse. Have chills. Have a high fever. Summary A hydrocele is  a collection of fluid in the loose pouch of skin that holds the testicles (scrotum). A hydrocele can cause swelling, discomfort, and pain. In adults, the cause of a hydrocele may not be known. However, it is sometimes caused by an infection or the twisting of a testicle. Hydroceles often go away on their own. If a hydrocele causes pain, treating the  underlying cause may be needed to ease the pain. This information is not intended to replace advice given to you by your health care provider. Make sure you discuss any questions you have with your health care provider. Document Revised: 02/18/2021 Document Reviewed: 02/18/2021 Elsevier Patient Education  2024 Else[employer] Inc[employer]  Hydrocelectomy, Adult  A hydrocelectomy is a surgical procedure to remove a collection of fluid (hydrocele) from the scrotum. The scrotum is the pouch that holds the testicles. You may need to have this procedure if a hydrocele is causing painful swelling in your scrotum. Tell a health care provider about: Any allergies you have. All medicines you are taking, including vitamins, herbs, eye drops, creams, and over-the-counter medicines. Any problems you or family members have had with anesthetic medicines. Any bleeding problems you have. Any surgeries you have had. Any medical conditions you have. What are the risks? Generally, this is a safe procedure. However, problems may occur, including: Bleeding into the scrotum (scrotal hematoma). Damage to nearby structures or organs, including to the testicle or the tube that carries sperm out of the testicle (vas deferens). Infection. Allergic reactions to medicines. What happens before the procedure? When to stop eating and drinking Follow instructions from your health care provider about what you may eat and drink before your procedure. These may include: 8 hours before your procedure Stop eating most foods. Do not eat meat, fried foods, or fatty foods. Eat only light foods, such as toast or crackers. All liquids are okay except energy drinks and alcohol. 6 hours before your procedure Stop eating. Drink only clear liquids, such as water, clear fruit juice, black coffee, plain tea, and sports drinks. Do not drink energy drinks or alcohol. 2 hours before your procedure Stop drinking all liquids. You may be allowed to  take medicines with small sips of water. If you do not follow your health care provider's instructions, your procedure may be delayed or canceled. Medicines Ask your health care provider about: Changing or stopping your regular medicines. This is especially important if you are taking diabetes medicines or blood thinners. Taking medicines such as aspirin and ibuprofen. These medicines can thin your blood. Do not take these medicines unless your health care provider tells you to take them. Taking over-the-counter medicines, vitamins, herbs, and supplements. Surgery safety Ask your health care provider: How your surgery site will be marked. What steps will be taken to help prevent infection. These steps may include: Removing hair at the surgery site. Washing skin with a germ-killing soap. Taking antibiotic medicine. General instructions Do not use any products that contain nicotine or tobacco for at least 4 weeks before the procedure. These products include cigarettes, chewing tobacco, and vaping devices, such as e-cigarettes. If you need help quitting, ask your health care provider. If you will be going home right after the procedure, plan to have a responsible adult: Take you home from the hospital or clinic. You will not be allowed to drive. Care for you for the time you are told. What happens during the procedure? An IV will be inserted into one of your veins. You will be given one  or both of the following: A medicine to make you relax (sedative). A medicine to make you fall asleep (general anesthetic). A small incision will be made through the skin of your scrotum. Your testicle and the hydrocele will be located, and the hydrocele sac will be opened with an incision. The fluid will be drained from the hydrocele. Part of the hydrocele sac may be removed. The hydrocele will be closed with stitches that dissolve (absorbable sutures). This prevents fluid from building up again. If your  hydrocele is large, you may have a thin, rubber drain placed to allow fluid to drain after the procedure. The incision in your scrotum will be closed with absorbable sutures, skin glue, or adhesive strips. A bandage (dressing) will be placed over the incision. The dressing may be held in place with an athletic support strap (scrotal support). The procedure may vary among health care providers and hospitals. What happens after the procedure?  Your blood pressure, heart rate, breathing rate, and blood oxygen level will be monitored until you leave the hospital or clinic. You will be given pain medicine as needed. Your IV will be removed, and your insertion site will be checked for bleeding. You may need to wear a scrotal support. This holds the dressing in place and supports your scrotum. If you were given a sedative during the procedure, it can affect you for several hours. Do not drive or operate machinery until your health care provider says that it is safe. Summary A hydrocelectomy is a surgical procedure to remove a collection of fluid from the scrotum. You may need to have this procedure if a hydrocele is causing painful swelling in your scrotum. During the procedure, the hydrocele will be drained and then closed with stitches that dissolve (absorbable sutures). This prevents fluid from building up again. If your hydrocele is large, you may have a thin, rubber drain placed to allow fluid to drain after the procedure. You may need to wear a scrotal support after your procedure. This holds the dressing in place and supports your scrotum. This information is not intended to replace advice given to you by your health care provider. Make sure you discuss any questions you have with your health care provider. Document Revised: 02/18/2021 Document Reviewed: 02/18/2021 Elsevier Patient Education  2024 [employer].

## 2023-07-20 NOTE — Progress Notes (Signed)
   07/20/2023 2:18 PM   [name] 12-12-1950 [id number]  Reason for visit: Follow up history of bladder cancer, right hydrocele  HPI: 73 year old male with history of bladder cancer treated with neoadjuvant chemotherapy and radical cystoprostatectomy and ileal conduit diversion with Dr. Alvaro in January 2020.  No evidence of recurrence since that time, and he follows regularly with oncology for surveillance imaging.   He was initially referred to me in June 2023 for a right hydrocele as well as chronic scrotal itching.  He opted for surveillance alone at that time.  Since our last visit he denies any major changes.  The scrotal itching is actually improved significantly.  On exam he has a moderate right-sided hydrocele.  I personally viewed and interpreted the most recent CT abdomen and pelvis dated 06/13/2023 that shows a stable right-sided hydrocele, no evidence of recurrence of disease, no hernia.  We again reviewed options including observation or hydrocelectomy.  Risk and benefits were discussed at length.  At this point he is comfortable with observation and return precautions were discussed.  -Continue follow-up with oncology for history of muscle invasive bladder cancer treated with cystectomy and ileal conduit -RTC yearly for hydrocele monitoring, okay to schedule right hydrocelectomy if he desires in the future  Redell JAYSON Burnet, MD  [facility] [address], [address] [place], [place] [address] [phone]

## 2023-08-10 DIAGNOSIS — H25813 Combined forms of age-related cataract, bilateral: Secondary | ICD-10-CM | POA: Diagnosis not present

## 2023-08-15 ENCOUNTER — Other Ambulatory Visit: Payer: Self-pay | Admitting: Internal Medicine

## 2023-08-17 DIAGNOSIS — Z936 Other artificial openings of urinary tract status: Secondary | ICD-10-CM | POA: Diagnosis not present

## 2023-08-17 DIAGNOSIS — C678 Malignant neoplasm of overlapping sites of bladder: Secondary | ICD-10-CM | POA: Diagnosis not present

## 2023-08-17 DIAGNOSIS — N130 Hydronephrosis with ureteropelvic junction obstruction: Secondary | ICD-10-CM | POA: Diagnosis not present

## 2023-09-11 DIAGNOSIS — N130 Hydronephrosis with ureteropelvic junction obstruction: Secondary | ICD-10-CM | POA: Diagnosis not present

## 2023-09-11 DIAGNOSIS — C775 Secondary and unspecified malignant neoplasm of intrapelvic lymph nodes: Secondary | ICD-10-CM | POA: Diagnosis not present

## 2023-09-11 DIAGNOSIS — Z936 Other artificial openings of urinary tract status: Secondary | ICD-10-CM | POA: Diagnosis not present

## 2023-09-11 DIAGNOSIS — C678 Malignant neoplasm of overlapping sites of bladder: Secondary | ICD-10-CM | POA: Diagnosis not present

## 2023-09-21 DIAGNOSIS — E7800 Pure hypercholesterolemia, unspecified: Secondary | ICD-10-CM | POA: Diagnosis not present

## 2023-09-21 DIAGNOSIS — N1831 Chronic kidney disease, stage 3a: Secondary | ICD-10-CM | POA: Diagnosis not present

## 2023-09-21 DIAGNOSIS — Z125 Encounter for screening for malignant neoplasm of prostate: Secondary | ICD-10-CM | POA: Diagnosis not present

## 2023-09-21 DIAGNOSIS — E1122 Type 2 diabetes mellitus with diabetic chronic kidney disease: Secondary | ICD-10-CM | POA: Diagnosis not present

## 2023-09-21 DIAGNOSIS — I10 Essential (primary) hypertension: Secondary | ICD-10-CM | POA: Diagnosis not present

## 2023-09-28 DIAGNOSIS — N1832 Chronic kidney disease, stage 3b: Secondary | ICD-10-CM | POA: Diagnosis not present

## 2023-09-28 DIAGNOSIS — I10 Essential (primary) hypertension: Secondary | ICD-10-CM | POA: Diagnosis not present

## 2023-09-28 DIAGNOSIS — E7800 Pure hypercholesterolemia, unspecified: Secondary | ICD-10-CM | POA: Diagnosis not present

## 2023-09-28 DIAGNOSIS — N1831 Chronic kidney disease, stage 3a: Secondary | ICD-10-CM | POA: Diagnosis not present

## 2023-09-28 DIAGNOSIS — E1122 Type 2 diabetes mellitus with diabetic chronic kidney disease: Secondary | ICD-10-CM | POA: Diagnosis not present

## 2023-10-02 ENCOUNTER — Other Ambulatory Visit: Payer: Self-pay | Admitting: Internal Medicine

## 2023-10-02 DIAGNOSIS — N1831 Chronic kidney disease, stage 3a: Secondary | ICD-10-CM

## 2023-10-03 ENCOUNTER — Ambulatory Visit
Admission: RE | Admit: 2023-10-03 | Discharge: 2023-10-03 | Disposition: A | Discharge status: Home / Self Care | Source: Ambulatory Visit | Attending: Internal Medicine | Admitting: Internal Medicine

## 2023-10-03 DIAGNOSIS — N281 Cyst of kidney, acquired: Secondary | ICD-10-CM | POA: Diagnosis not present

## 2023-10-03 DIAGNOSIS — N1831 Chronic kidney disease, stage 3a: Secondary | ICD-10-CM | POA: Diagnosis not present

## 2023-10-03 DIAGNOSIS — N183 Chronic kidney disease, stage 3 unspecified: Secondary | ICD-10-CM | POA: Diagnosis not present

## 2023-10-17 DIAGNOSIS — R8290 Unspecified abnormal findings in urine: Secondary | ICD-10-CM | POA: Diagnosis not present

## 2023-10-17 DIAGNOSIS — N1832 Chronic kidney disease, stage 3b: Secondary | ICD-10-CM | POA: Diagnosis not present

## 2023-10-17 DIAGNOSIS — I129 Hypertensive chronic kidney disease with stage 1 through stage 4 chronic kidney disease, or unspecified chronic kidney disease: Secondary | ICD-10-CM | POA: Diagnosis not present

## 2023-10-17 DIAGNOSIS — E785 Hyperlipidemia, unspecified: Secondary | ICD-10-CM | POA: Diagnosis not present

## 2023-10-17 DIAGNOSIS — E1122 Type 2 diabetes mellitus with diabetic chronic kidney disease: Secondary | ICD-10-CM | POA: Diagnosis not present

## 2023-10-25 ENCOUNTER — Other Ambulatory Visit: Payer: Self-pay | Admitting: *Deleted

## 2023-10-25 DIAGNOSIS — C675 Malignant neoplasm of bladder neck: Secondary | ICD-10-CM

## 2023-10-25 DIAGNOSIS — N1832 Chronic kidney disease, stage 3b: Secondary | ICD-10-CM

## 2023-10-26 ENCOUNTER — Encounter: Payer: Self-pay | Admitting: Internal Medicine

## 2023-10-26 ENCOUNTER — Inpatient Hospital Stay: Payer: PPO | Attending: Internal Medicine

## 2023-10-26 ENCOUNTER — Inpatient Hospital Stay: Payer: PPO | Admitting: Internal Medicine

## 2023-10-26 VITALS — BP 120/77 | HR 76 | Temp 97.1°F | Resp 16 | Ht 66.0 in | Wt 224.8 lb

## 2023-10-26 DIAGNOSIS — J302 Other seasonal allergic rhinitis: Secondary | ICD-10-CM | POA: Diagnosis not present

## 2023-10-26 DIAGNOSIS — N183 Chronic kidney disease, stage 3 unspecified: Secondary | ICD-10-CM | POA: Diagnosis not present

## 2023-10-26 DIAGNOSIS — I129 Hypertensive chronic kidney disease with stage 1 through stage 4 chronic kidney disease, or unspecified chronic kidney disease: Secondary | ICD-10-CM | POA: Insufficient documentation

## 2023-10-26 DIAGNOSIS — C675 Malignant neoplasm of bladder neck: Secondary | ICD-10-CM

## 2023-10-26 DIAGNOSIS — Z8551 Personal history of malignant neoplasm of bladder: Secondary | ICD-10-CM | POA: Diagnosis not present

## 2023-10-26 DIAGNOSIS — N1832 Chronic kidney disease, stage 3b: Secondary | ICD-10-CM

## 2023-10-26 DIAGNOSIS — Z7982 Long term (current) use of aspirin: Secondary | ICD-10-CM | POA: Insufficient documentation

## 2023-10-26 DIAGNOSIS — Z79899 Other long term (current) drug therapy: Secondary | ICD-10-CM | POA: Insufficient documentation

## 2023-10-26 LAB — CMP (CANCER CENTER ONLY)
ALT: 17 U/L (ref 0–44)
AST: 20 U/L (ref 15–41)
Albumin: 3.8 g/dL (ref 3.5–5.0)
Alkaline Phosphatase: 66 U/L (ref 38–126)
Anion gap: 7 (ref 5–15)
BUN: 36 mg/dL — ABNORMAL HIGH (ref 8–23)
CO2: 22 mmol/L (ref 22–32)
Calcium: 8.8 mg/dL — ABNORMAL LOW (ref 8.9–10.3)
Chloride: 105 mmol/L (ref 98–111)
Creatinine: 2.09 mg/dL — ABNORMAL HIGH (ref 0.61–1.24)
GFR, Estimated: 33 mL/min — ABNORMAL LOW (ref >60)
Glucose, Bld: 213 mg/dL — ABNORMAL HIGH (ref 70–99)
Potassium: 3.7 mmol/L (ref 3.5–5.1)
Sodium: 134 mmol/L — ABNORMAL LOW (ref 135–145)
Total Bilirubin: 0.7 mg/dL (ref 0.0–1.2)
Total Protein: 7.1 g/dL (ref 6.5–8.1)

## 2023-10-26 LAB — CBC WITH DIFFERENTIAL/PLATELET
Abs Immature Granulocytes: 0.02 10*3/uL (ref 0.00–0.07)
Basophils Absolute: 0.1 10*3/uL (ref 0.0–0.1)
Basophils Relative: 1 %
Eosinophils Absolute: 0.2 10*3/uL (ref 0.0–0.5)
Eosinophils Relative: 4 %
HCT: 41.5 % (ref 39.0–52.0)
Hemoglobin: 14.2 g/dL (ref 13.0–17.0)
Immature Granulocytes: 0 %
Lymphocytes Relative: 16 %
Lymphs Abs: 0.9 10*3/uL (ref 0.7–4.0)
MCH: 30.5 pg (ref 26.0–34.0)
MCHC: 34.2 g/dL (ref 30.0–36.0)
MCV: 89.2 fL (ref 80.0–100.0)
Monocytes Absolute: 0.4 10*3/uL (ref 0.1–1.0)
Monocytes Relative: 8 %
Neutro Abs: 3.8 10*3/uL (ref 1.7–7.7)
Neutrophils Relative %: 71 %
Platelets: 257 10*3/uL (ref 150–400)
RBC: 4.65 MIL/uL (ref 4.22–5.81)
RDW: 13.3 % (ref 11.5–15.5)
WBC: 5.4 10*3/uL (ref 4.0–10.5)
nRBC: 0 % (ref 0.0–0.2)

## 2023-10-26 LAB — VITAMIN B12: Vitamin B-12: 338 pg/mL (ref 180–914)

## 2023-10-26 LAB — IRON AND TIBC
Iron: 96 ug/dL (ref 45–182)
Saturation Ratios: 29 % (ref 17.9–39.5)
TIBC: 336 ug/dL (ref 250–450)
UIBC: 240 ug/dL

## 2023-10-26 LAB — FERRITIN: Ferritin: 73 ng/mL (ref 24–336)

## 2023-10-26 LAB — FOLATE: Folate: 20.3 ng/mL (ref >5.9)

## 2023-10-26 NOTE — Progress Notes (Signed)
 Seeing [facility], hasn't gotten lab results from 10/17/23.  U/S kidneys 10/03/23, CT abd/pel 06/13/23.  C/o joint cramps, diuretic was increased.

## 2023-10-26 NOTE — Progress Notes (Signed)
 [facility] CONSULT NOTE  Patient Care Team: Lauro Regulus, MD as PCP - General (Internal Medicine) Vanna Scotland, MD as Consulting Physician (Urology) Earna Coder, MD as Consulting Physician (Internal Medicine)  CHIEF COMPLAINTS/PURPOSE OF CONSULTATION:  Bladder cancer  #  Oncology History Overview Note  # BLADDER CANCER: pT2/cpT4  prostate/bladder neck Dr.Brandon TURP s/p  - INVASIVE UROTHELIAL CARCINOMA, HIGH-GRADE;  TUMOR INVADES BLADDER NECK MUSCULARIS PROPRIA. Mild-mod Right hydronephrosis [facility] RISK FEATURES]   # OCt 9t ddMVAC x4; Jan 24th- 2021 cystectomy [Dr.Manny; [place]] ypTis ypN1; stage III suriveillaince  # Feb 2020- UTI/sepsis- ICU  x3 weeks IV antibiotics/ s-p port removal.   # 2 Echo- 50% to 55%. [oct 2019]  # COPD/ not on O2; [quit 2009];    DIAGNOSIS: BLADDER CA  STAGE: Stage III;GOALS: cure  CURRENT/MOST RECENT THERAPY : Surveillance    Cancer of bladder neck (HCC)    HISTORY OF PRESENTING ILLNESS: Alone.  Ambulating independently.  Steve Gilbert 73 y.o.  male with muscle invasive bladder cancer status post neoadjuvant chemotherapy-followed by cystectomy currently on surveillance is here for follow-up   Patient is currently Seeing [facility].C/o joint cramps, diuretic was increased. Patient denies any new onset of back pain or joint pains.   In the interim-  U/S kidneys 10/03/23.   Denies any shortness of breath or cough no bone pain.  No nausea no vomiting. No worsening shortness of breath or chest pain.  Review of Systems  Constitutional:  Negative for chills, diaphoresis, fever and weight loss.  HENT:  Negative for nosebleeds and sore throat.   Eyes:  Negative for double vision.  Cardiovascular:  Negative for chest pain, palpitations, orthopnea and leg swelling.  Gastrointestinal:  Negative for abdominal pain, blood in stool, constipation, diarrhea, heartburn, melena, nausea and vomiting.  Genitourinary:   Negative for dysuria, frequency and urgency.  Musculoskeletal:  Positive for back pain and joint pain.  Neurological:  Negative for dizziness, tingling, focal weakness, weakness and headaches.  Endo/Heme/Allergies:  Does not bruise/bleed easily.  Psychiatric/Behavioral:  Negative for depression. The patient is not nervous/anxious and does not have insomnia.      MEDICAL HISTORY:  Past Medical History:  Diagnosis Date   Acid reflux    Allergy    Arthritis    both hands   Cancer of bladder neck (HCC) 02/2018   Chemo tx's and surgical resection.   Chronic kidney disease    Complication of anesthesia    behaviors combative   COPD (chronic obstructive pulmonary disease) (HCC)    High cholesterol    History of kidney stones    Hyperlipidemia 04/03/2018   Hypertension    Pneumonia    Pre-diabetes     SURGICAL HISTORY: Past Surgical History:  Procedure Laterality Date   bladder removed  2020   CYSTOSCOPY W/ URETERAL STENT REMOVAL Right 08/10/2018   Procedure: CYSTOSCOPY WITH STENT REMOVAL;  Surgeon: Sebastian Ache, MD;  Location: [facility];  Service: Urology;  Laterality: Right;   CYSTOSCOPY WITH BIOPSY N/A 04/04/2018   Procedure: CYSTOSCOPY WITH TURBT;  Surgeon: Vanna Scotland, MD;  Location: [facility];  Service: Urology;  Laterality: N/A;   CYSTOSCOPY WITH URETEROSCOPY AND STENT PLACEMENT Right 04/04/2018   Procedure: CYSTOSCOPY WITH URETEROSCOPY AND STENT PLACEMENT;  Surgeon: Vanna Scotland, MD;  Location: [facility];  Service: Urology;  Laterality: Right;   HERNIA REPAIR     LAPAROTOMY N/A 04/24/2021   Procedure: EXPLORATORY LAPAROTOMY; PARASTOMAL HERNIA REPAIR Sugerbaker technique  incisional  hernia repair, right sided TAR;  Surgeon: Stechschulte, Hyman Hopes, MD;  Location: [facility];  Service: General;  Laterality: N/A;   NASAL SINUS SURGERY     PORTA CATH INSERTION N/A 04/23/2018   Procedure: PORTA CATH INSERTION;  Surgeon: Annice Needy, MD;  Location: [facility];  Service:  Cardiovascular;  Laterality: N/A;   PORTA CATH REMOVAL N/A 09/04/2018   Procedure: PORTA CATH REMOVAL;  Surgeon: Renford Dills, MD;  Location: [facility];  Service: Cardiovascular;  Laterality: N/A;   SUBMANDIBULAR GLAND EXCISION Left 06/22/2022   Procedure: EXCISION SUBMANDIBULAR GLAND;  Surgeon: Geanie Logan, MD;  Location: [facility];  Service: ENT;  Laterality: Left;   TEE WITHOUT CARDIOVERSION N/A 09/05/2018   Procedure: TRANSESOPHAGEAL ECHOCARDIOGRAM (TEE);  Surgeon: Antonieta Iba, MD;  Location: [facility];  Service: Cardiovascular;  Laterality: N/A;   TRANSURETHRAL RESECTION OF PROSTATE N/A 04/04/2018   Procedure: TRANSURETHRAL RESECTION OF THE PROSTATE (TURP);  Surgeon: Vanna Scotland, MD;  Location: [facility];  Service: Urology;  Laterality: N/A;   URETERAL BIOPSY Right 04/04/2018   Procedure: URETERAL BIOPSY;  Surgeon: Vanna Scotland, MD;  Location: [facility];  Service: Urology;  Laterality: Right;     SOCIAL HISTORY:  Social History   Socioeconomic History   Marital status: Widowed    Spouse name: Not on file   Number of children: Not on file   Years of education: Not on file   Highest education level: Not on file  Occupational History   Not on file  Tobacco Use   Smoking status: Former    Current packs/day: 0.00    Average packs/day: 1.5 packs/day for 30.0 years (45.0 ttl pk-yrs)    Types: Cigarettes    Start date: 89    Quit date: 2009    Years since quitting: 16.2   Smokeless tobacco: Former  Building services engineer status: Never Used  Substance and Sexual Activity   Alcohol use: Not Currently    Alcohol/week: 7.0 standard drinks of alcohol    Types: 5 Cans of beer, 2 Standard drinks or equivalent per week    Comment: ocassional    Drug use: Not Currently   Sexual activity: Not Currently  Other Topics Concern   Not on file  Social History Narrative   currently retired.  Works part-time.  Remote history of smoking.  No alcohol.   Social  Drivers of Corporate investment banker Strain: Low Risk  (09/28/2023)   Received [facility]   Overall Financial Resource Strain (CARDIA)    Difficulty of Paying Living Expenses: Not hard at all  Food Insecurity: No Food Insecurity (09/28/2023)   Received from [facility]   Hunger Vital Sign    Worried About Running Out of Food in the Last Year: Never true    Ran Out of Food in the Last Year: Never true  Transportation Needs: No Transportation Needs (09/28/2023)   Received from [facility] - Transportation    In the past 12 months, has lack of transportation kept you from medical appointments or from getting medications?: No    Lack of Transportation (Non-Medical): No  Physical Activity: Unknown (08/09/2018)   Exercise Vital Sign    Days of Exercise per Week: Patient declined    Minutes of Exercise per Session: Patient declined  Stress: No Stress Concern Present (08/09/2018)   [employer] of Occupational Health - Occupational Stress Questionnaire  Feeling of Stress : Not at all  Social Connections: Unknown (08/09/2018)   Social Connection and Isolation Panel [NHANES]    Frequency of Communication with Friends and Family: Patient declined    Frequency of Social Gatherings with Friends and Family: Patient declined    Attends Religious Services: Patient declined    Database administrator or Organizations: Patient declined    Attends Banker Meetings: Patient declined    Marital Status: Patient declined  Intimate Partner Violence: Unknown (08/09/2018)   Humiliation, Afraid, Rape, and Kick questionnaire    Fear of Current or Ex-Partner: Patient declined    Emotionally Abused: Patient declined    Physically Abused: Patient declined    Sexually Abused: Patient declined    FAMILY HISTORY: Family History  Problem Relation Age of Onset   Diabetes Mother    Heart disease Father    Diabetes Brother     Heart attack Brother    Heart disease Brother     ALLERGIES:  is allergic to iodinated contrast media.  MEDICATIONS:  Current Outpatient Medications  Medication Sig Dispense Refill   albuterol (VENTOLIN HFA) 108 (90 Base) MCG/ACT inhaler Inhale into the lungs.     amLODipine (NORVASC) 10 MG tablet TAKE 1 TABLET(10 MG) BY MOUTH EVERY DAY     aspirin EC 81 MG tablet Take 1 tablet by mouth daily.     atorvastatin (LIPITOR) 20 MG tablet Take 20 mg by mouth daily.     azelastine (ASTELIN) 0.1 % nasal spray Place 1 spray into both nostrils 2 (two) times daily.     fluticasone-salmeterol (ADVAIR) 250-50 MCG/ACT AEPB Inhale 1 puff into the lungs 2 (two) times daily.     losartan-hydrochlorothiazide (HYZAAR) 100-12.5 MG tablet Take 1 tablet by mouth daily.     metoprolol tartrate (LOPRESSOR) 25 MG tablet TAKE 1 TABLET BY MOUTH TWICE DAILY 60 tablet 3   Multiple Vitamins-Minerals (MULTIVITAMIN WITH MINERALS) tablet Take 1 tablet by mouth daily.     pantoprazole (PROTONIX) 40 MG tablet Take 40 mg by mouth every evening.      predniSONE (DELTASONE) 20 MG tablet Take 40 mg by mouth daily.     tiotropium (SPIRIVA HANDIHALER) 18 MCG inhalation capsule Place 18 mcg into inhaler and inhale daily.     No current facility-administered medications for this visit.      Marland Kitchen  PHYSICAL EXAMINATION: ECOG PERFORMANCE STATUS: 1 - Symptomatic but completely ambulatory  Vitals:   10/26/23 1008  BP: 120/77  Pulse: 76  Resp: 16  Temp: (!) 97.1 F (36.2 C)  SpO2: 98%    Filed Weights   10/26/23 1008  Weight: 224 lb 12.8 oz (102 kg)     Physical Exam Constitutional:      Comments: He is alone.  Walking himself.  HENT:     Head: Normocephalic and atraumatic.     Mouth/Throat:     Pharynx: No oropharyngeal exudate.  Eyes:     Pupils: Pupils are equal, round, and reactive to light.  Cardiovascular:     Rate and Rhythm: Normal rate and regular rhythm.  Pulmonary:     Effort: No respiratory  distress.     Breath sounds: Normal breath sounds.  Abdominal:     General: Bowel sounds are normal. There is no distension.     Palpations: Abdomen is soft. There is no mass.     Tenderness: There is no abdominal tenderness. There is no guarding or rebound.     Comments:  Urostomy.   Musculoskeletal:        General: No tenderness. Normal range of motion.     Cervical back: Normal range of motion and neck supple.  Skin:    General: Skin is warm.  Neurological:     Mental Status: He is alert and oriented to person, place, and time.  Psychiatric:        Mood and Affect: Affect normal.      LABORATORY DATA:  I have reviewed the data as listed Lab Results  Component Value Date   WBC 5.4 10/26/2023   HGB 14.2 10/26/2023   HCT 41.5 10/26/2023   MCV 89.2 10/26/2023   PLT 257 10/26/2023   Recent Labs    04/27/23 1021 10/26/23 0954  NA 138 134*  K 4.0 3.7  CL 103 105  CO2 25 22  GLUCOSE 111* 213*  BUN 28* 36*  CREATININE 1.75* 2.09*  CALCIUM 9.4 8.8*  GFRNONAA 41* 33*  PROT 7.3 7.1  ALBUMIN 4.2 3.8  AST 28 20  ALT 27 17  ALKPHOS 71 66  BILITOT 0.9 0.7    RADIOGRAPHIC STUDIES: I have personally reviewed the radiological images as listed and agreed with the findings in the report. US RENAL Result Date: 10/06/2023 CLINICAL DATA:  Stage III chronic kidney disease. EXAM: RENAL / URINARY TRACT ULTRASOUND COMPLETE COMPARISON:  CT abdomen pelvis June 13, 2023 FINDINGS: Right Kidney: Renal measurements: 8.9 x 5.1 x 4.7 cm = volume: 112 mL. Echogenicity within normal limits. No hydronephrosis visualized. 1.1 x 0.8 x 0.8 cm cyst in the upper pole right kidney. No follow-up is recommended. Left Kidney: Renal measurements: 10.9 x 5.4 x 4.5 cm = volume: 138 mL. Echogenicity within normal limits. No mass or hydronephrosis visualized. Bladder: Surgically removed. Other: None. IMPRESSION: No acute abnormality identified. Electronically Signed   By: Sherian Rein M.D.   On: 10/06/2023  12:13    ASSESSMENT & PLAN:   Cancer of bladder neck (HCC) #Transitional cell bladder cancer neo-adjuvant chemo-s/p 4 cycles of dose dense MVAC; status post cystectomy [JAN 2021]. SEP 2023: New ill-defined focal soft tissue located adjacent to the wall of a distal small bowel loop with surrounding fat stranding. CT scan OCT 2024-no evidence of metastatic disease noted- Stable.   # DEC 2023- Left submandibular nodule-PET avid-s/p ultrasound-guided biopsy-benign adenoid tumor-  re: Excision s/p referral- s/p resection- Left; Resection:  Pleomorphic Adenoma. Negative for Malignancy. Reviewed with patient.   # Chronic kidney disease-stage III- GFR- 37-  continue follow up with [facility].   # HNT-on metoprolol/ diuretic- continue to follow up PCP/nephrology re: further refills. Stable.   # Seasonal allergies: recommend compliance with anti-histamines; stable.   Dye- allegry- contrast-allegry # DISPOSITION: # follow up in 6 months/labs-MD;  cbc/cmp;b12; folic acid; Vit D 25-OH; CT AP non- contrast- DRI--Dr.B     All questions were answered. The patient knows to call the clinic with any problems, questions or concerns.     Earna Coder, MD 10/26/2023 11:01 AM

## 2023-10-26 NOTE — Assessment & Plan Note (Addendum)
#  Transitional cell bladder cancer neo-adjuvant chemo-s/p 4 cycles of dose dense MVAC; status post cystectomy [JAN 2021]. SEP 2023: New ill-defined focal soft tissue located adjacent to the wall of a distal small bowel loop with surrounding fat stranding. CT scan OCT 2024-no evidence of metastatic disease noted- Stable.   # DEC 2023- Left submandibular nodule-PET avid-s/p ultrasound-guided biopsy-benign adenoid tumor-  re: Excision s/p referral- s/p resection- Left; Resection:  Pleomorphic Adenoma. Negative for Malignancy. Reviewed with patient.   # Chronic kidney disease-stage III- GFR- 37-  continue follow up with [facility].   # HNT-on metoprolol/ diuretic- continue to follow up PCP/nephrology re: further refills. Stable.   # Seasonal allergies: recommend compliance with anti-histamines; stable.   Dye- allegry- contrast-allegry # DISPOSITION: # follow up in 6 months/labs-MD;  cbc/cmp;b12; folic acid; Vit D 25-OH; CT AP non- contrast- DRI--Dr.B

## 2023-11-02 DIAGNOSIS — C678 Malignant neoplasm of overlapping sites of bladder: Secondary | ICD-10-CM | POA: Diagnosis not present

## 2023-11-02 DIAGNOSIS — Z936 Other artificial openings of urinary tract status: Secondary | ICD-10-CM | POA: Diagnosis not present

## 2023-11-02 DIAGNOSIS — N130 Hydronephrosis with ureteropelvic junction obstruction: Secondary | ICD-10-CM | POA: Diagnosis not present

## 2023-11-02 DIAGNOSIS — H35373 Puckering of macula, bilateral: Secondary | ICD-10-CM | POA: Diagnosis not present

## 2023-11-03 ENCOUNTER — Inpatient Hospital Stay: Admission: RE | Admit: 2023-11-03 | Source: Ambulatory Visit

## 2023-11-21 DIAGNOSIS — N1832 Chronic kidney disease, stage 3b: Secondary | ICD-10-CM | POA: Diagnosis not present

## 2023-11-21 DIAGNOSIS — E1122 Type 2 diabetes mellitus with diabetic chronic kidney disease: Secondary | ICD-10-CM | POA: Diagnosis not present

## 2023-11-21 DIAGNOSIS — R809 Proteinuria, unspecified: Secondary | ICD-10-CM | POA: Diagnosis not present

## 2023-11-21 DIAGNOSIS — I129 Hypertensive chronic kidney disease with stage 1 through stage 4 chronic kidney disease, or unspecified chronic kidney disease: Secondary | ICD-10-CM | POA: Diagnosis not present

## 2023-11-21 DIAGNOSIS — E785 Hyperlipidemia, unspecified: Secondary | ICD-10-CM | POA: Diagnosis not present

## 2023-12-14 DIAGNOSIS — C678 Malignant neoplasm of overlapping sites of bladder: Secondary | ICD-10-CM | POA: Diagnosis not present

## 2023-12-14 DIAGNOSIS — Z936 Other artificial openings of urinary tract status: Secondary | ICD-10-CM | POA: Diagnosis not present

## 2023-12-14 DIAGNOSIS — N130 Hydronephrosis with ureteropelvic junction obstruction: Secondary | ICD-10-CM | POA: Diagnosis not present

## 2023-12-19 DIAGNOSIS — N1832 Chronic kidney disease, stage 3b: Secondary | ICD-10-CM | POA: Diagnosis not present

## 2023-12-19 DIAGNOSIS — E1122 Type 2 diabetes mellitus with diabetic chronic kidney disease: Secondary | ICD-10-CM | POA: Diagnosis not present

## 2023-12-19 DIAGNOSIS — R809 Proteinuria, unspecified: Secondary | ICD-10-CM | POA: Diagnosis not present

## 2023-12-19 DIAGNOSIS — I129 Hypertensive chronic kidney disease with stage 1 through stage 4 chronic kidney disease, or unspecified chronic kidney disease: Secondary | ICD-10-CM | POA: Diagnosis not present

## 2023-12-19 DIAGNOSIS — E785 Hyperlipidemia, unspecified: Secondary | ICD-10-CM | POA: Diagnosis not present

## 2023-12-20 ENCOUNTER — Other Ambulatory Visit: Payer: Self-pay | Admitting: Internal Medicine

## 2023-12-20 DIAGNOSIS — Z936 Other artificial openings of urinary tract status: Secondary | ICD-10-CM | POA: Diagnosis not present

## 2023-12-20 DIAGNOSIS — C678 Malignant neoplasm of overlapping sites of bladder: Secondary | ICD-10-CM | POA: Diagnosis not present

## 2023-12-20 DIAGNOSIS — N130 Hydronephrosis with ureteropelvic junction obstruction: Secondary | ICD-10-CM | POA: Diagnosis not present

## 2023-12-20 NOTE — Telephone Encounter (Signed)
 Per Dr. Art Largo last office note: "continue to follow up PCP/nephrology re: further refills".

## 2023-12-21 DIAGNOSIS — H35373 Puckering of macula, bilateral: Secondary | ICD-10-CM | POA: Diagnosis not present

## 2023-12-25 ENCOUNTER — Other Ambulatory Visit: Payer: Self-pay | Admitting: Internal Medicine

## 2024-01-25 DIAGNOSIS — N1832 Chronic kidney disease, stage 3b: Secondary | ICD-10-CM | POA: Diagnosis not present

## 2024-01-25 DIAGNOSIS — I10 Essential (primary) hypertension: Secondary | ICD-10-CM | POA: Diagnosis not present

## 2024-01-25 DIAGNOSIS — R7303 Prediabetes: Secondary | ICD-10-CM | POA: Diagnosis not present

## 2024-02-08 DIAGNOSIS — N1832 Chronic kidney disease, stage 3b: Secondary | ICD-10-CM | POA: Diagnosis not present

## 2024-02-14 DIAGNOSIS — N130 Hydronephrosis with ureteropelvic junction obstruction: Secondary | ICD-10-CM | POA: Diagnosis not present

## 2024-02-14 DIAGNOSIS — Z936 Other artificial openings of urinary tract status: Secondary | ICD-10-CM | POA: Diagnosis not present

## 2024-02-14 DIAGNOSIS — C678 Malignant neoplasm of overlapping sites of bladder: Secondary | ICD-10-CM | POA: Diagnosis not present

## 2024-02-29 ENCOUNTER — Encounter: Payer: Self-pay | Admitting: Urology

## 2024-04-09 DIAGNOSIS — Z936 Other artificial openings of urinary tract status: Secondary | ICD-10-CM | POA: Diagnosis not present

## 2024-04-09 DIAGNOSIS — N130 Hydronephrosis with ureteropelvic junction obstruction: Secondary | ICD-10-CM | POA: Diagnosis not present

## 2024-04-09 DIAGNOSIS — C678 Malignant neoplasm of overlapping sites of bladder: Secondary | ICD-10-CM | POA: Diagnosis not present

## 2024-04-23 ENCOUNTER — Ambulatory Visit
Admission: RE | Admit: 2024-04-23 | Discharge: 2024-04-23 | Disposition: A | Source: Ambulatory Visit | Attending: Internal Medicine

## 2024-04-23 DIAGNOSIS — C679 Malignant neoplasm of bladder, unspecified: Secondary | ICD-10-CM | POA: Diagnosis not present

## 2024-04-23 DIAGNOSIS — C675 Malignant neoplasm of bladder neck: Secondary | ICD-10-CM

## 2024-04-24 ENCOUNTER — Other Ambulatory Visit: Payer: Self-pay

## 2024-04-24 DIAGNOSIS — C675 Malignant neoplasm of bladder neck: Secondary | ICD-10-CM

## 2024-04-25 ENCOUNTER — Inpatient Hospital Stay: Attending: Internal Medicine

## 2024-04-25 ENCOUNTER — Encounter: Payer: Self-pay | Admitting: Internal Medicine

## 2024-04-25 ENCOUNTER — Inpatient Hospital Stay: Admitting: Internal Medicine

## 2024-04-25 VITALS — BP 130/68 | HR 68 | Temp 95.8°F | Resp 16 | Ht 66.0 in | Wt 215.6 lb

## 2024-04-25 DIAGNOSIS — C675 Malignant neoplasm of bladder neck: Secondary | ICD-10-CM

## 2024-04-25 DIAGNOSIS — Z87891 Personal history of nicotine dependence: Secondary | ICD-10-CM | POA: Insufficient documentation

## 2024-04-25 DIAGNOSIS — Z7951 Long term (current) use of inhaled steroids: Secondary | ICD-10-CM | POA: Insufficient documentation

## 2024-04-25 DIAGNOSIS — Z7982 Long term (current) use of aspirin: Secondary | ICD-10-CM | POA: Diagnosis not present

## 2024-04-25 DIAGNOSIS — N183 Chronic kidney disease, stage 3 unspecified: Secondary | ICD-10-CM | POA: Insufficient documentation

## 2024-04-25 DIAGNOSIS — Z79899 Other long term (current) drug therapy: Secondary | ICD-10-CM | POA: Insufficient documentation

## 2024-04-25 DIAGNOSIS — Z7952 Long term (current) use of systemic steroids: Secondary | ICD-10-CM | POA: Diagnosis not present

## 2024-04-25 DIAGNOSIS — I129 Hypertensive chronic kidney disease with stage 1 through stage 4 chronic kidney disease, or unspecified chronic kidney disease: Secondary | ICD-10-CM | POA: Diagnosis not present

## 2024-04-25 DIAGNOSIS — Z8551 Personal history of malignant neoplasm of bladder: Secondary | ICD-10-CM | POA: Diagnosis not present

## 2024-04-25 LAB — CMP (CANCER CENTER ONLY)
ALT: 20 U/L (ref 0–44)
AST: 17 U/L (ref 15–41)
Albumin: 3.6 g/dL (ref 3.5–5.0)
Alkaline Phosphatase: 78 U/L (ref 38–126)
Anion gap: 7 (ref 5–15)
BUN: 30 mg/dL — ABNORMAL HIGH (ref 8–23)
CO2: 23 mmol/L (ref 22–32)
Calcium: 9 mg/dL (ref 8.9–10.3)
Chloride: 107 mmol/L (ref 98–111)
Creatinine: 2.32 mg/dL — ABNORMAL HIGH (ref 0.61–1.24)
GFR, Estimated: 29 mL/min — ABNORMAL LOW (ref >60)
Glucose, Bld: 62 mg/dL — ABNORMAL LOW (ref 70–99)
Potassium: 4.2 mmol/L (ref 3.5–5.1)
Sodium: 137 mmol/L (ref 135–145)
Total Bilirubin: 0.7 mg/dL (ref 0.0–1.2)
Total Protein: 7.2 g/dL (ref 6.5–8.1)

## 2024-04-25 LAB — CBC WITH DIFFERENTIAL (CANCER CENTER ONLY)
Abs Immature Granulocytes: 0.07 K/uL (ref 0.00–0.07)
Basophils Absolute: 0 K/uL (ref 0.0–0.1)
Basophils Relative: 1 %
Eosinophils Absolute: 0.2 K/uL (ref 0.0–0.5)
Eosinophils Relative: 4 %
HCT: 40.5 % (ref 39.0–52.0)
Hemoglobin: 13.3 g/dL (ref 13.0–17.0)
Immature Granulocytes: 1 %
Lymphocytes Relative: 11 %
Lymphs Abs: 0.7 K/uL (ref 0.7–4.0)
MCH: 29.8 pg (ref 26.0–34.0)
MCHC: 32.8 g/dL (ref 30.0–36.0)
MCV: 90.6 fL (ref 80.0–100.0)
Monocytes Absolute: 0.6 K/uL (ref 0.1–1.0)
Monocytes Relative: 10 %
Neutro Abs: 4.7 K/uL (ref 1.7–7.7)
Neutrophils Relative %: 73 %
Platelet Count: 293 K/uL (ref 150–400)
RBC: 4.47 MIL/uL (ref 4.22–5.81)
RDW: 13 % (ref 11.5–15.5)
WBC Count: 6.3 K/uL (ref 4.0–10.5)
nRBC: 0 % (ref 0.0–0.2)

## 2024-04-25 LAB — FOLATE: Folate: >20 ng/mL (ref >5.9)

## 2024-04-25 LAB — VITAMIN B12: Vitamin B-12: 426 pg/mL (ref 180–914)

## 2024-04-25 LAB — VITAMIN D 25 HYDROXY (VIT D DEFICIENCY, FRACTURES): Vit D, 25-Hydroxy: 47.1 ng/mL (ref 30–100)

## 2024-04-25 NOTE — Assessment & Plan Note (Addendum)
#   SEP 20219- Transitional cell bladder cancer neo-adjuvant chemo-s/p 4 cycles of dose dense MVAC; status post cystectomy [JAN 2021]. OCT 2025-  No evidence of metastatic disease.  Cysto prostatectomy with a right lower quadrant ileal conduit and parastomal hernia containing unobstructed small bowel.   # I had a long discussion with patient the fact that his cancer has been more than 5 years-and surveillance imaging October 2025 negative-I think this is reasonable for him to follow-up with his PCP going forward.  He does not need any follow-up imaging from cancer perspective.  Most likely he is cured of his malignancy.  # DEC 2023- Left submandibular nodule-PET avid-s/p ultrasound-guided biopsy-benign adenoid tumor-  re: Excision s/p referral- s/p resection- Left; Resection:  Pleomorphic Adenoma. Negative for Malignancy. Reviewed with patient- stable   # Chronic kidney disease-stage III- IV- GFR- 29   continue follow up with [facility]- stable  # HNT-on metoprolol / diuretic- continue to follow up PCP/nephrology-. Stable.   # Seasonal allergies: recommend compliance with anti-histamines; stable.   #Since patient is clinically stable I think is reasonable for the patient to follow-up with PCP/can follow-up with us  as needed.  Patient comfortable with the plan; to call us  if any questions or concerns in the interim.  Dye- allegry- contrast-allegry # DISPOSITION: # follow up as needed- Dr.B  C; Dr.linthavong.

## 2024-04-25 NOTE — Progress Notes (Signed)
 Changing PCP's from Dr. Lenon to Dr. Alla.  CT Abdomen/Pelvis 04/23/24.

## 2024-04-25 NOTE — Progress Notes (Signed)
 [facility] CONSULT NOTE  Patient Care Team: Lenon Layman Gilbert, Steve [facility], Steve Gilbert, Steve Gilbert/cpT4  prostate/bladder neck Dr.Brandon TURP s/p  - INVASIVE UROTHELIAL CARCINOMA, HIGH-GRADE;  TUMOR INVADES BLADDER NECK MUSCULARIS PROPRIA. Mild-mod Right hydronephrosis [facility] RISK FEATURES]   # OCt 9t ddMVAC x4; Jan 24th- 2020 cystectomy [Dr.Manny; [place]] ypTis ypN1; stage III suriveillaince  # Feb 2020- UTI/sepsis- ICU  x3 weeks IV antibiotics/ s-p port removal.   # 2 Echo- 50% to 55%. [oct 2019]  # COPD/ not on O2; [quit 2009];    DIAGNOSIS: BLADDER CA  STAGE: Stage III;GOALS: cure  CURRENT/MOST RECENT THERAPY : Surveillance    Cancer of bladder neck (HCC)    HISTORY OF PRESENTING ILLNESS: Alone.  Ambulating independently.  Steve Gilbert 73 y.o.  male with muscle invasive bladder cancer status post neoadjuvant chemotherapy-followed by cystectomy currently on surveillance is here for follow-up/review results of his CT scan.  He has been trying to lose weight, noting a recent change from 224 to 215 pounds. Some of this weight loss is attributed to being sick over the past couple of weeks, during which he had a poor appetite and vomited after eating a [employer] sandwich. He reports feeling better now.  He experienced difficulty breathing due to chest congestion, which has since improved as the congestion started to break up.  He reports increased water  intake due to sinus issues but notes that his urine remains dark. He is currently taking an inhaler for his sinuses and Mucinex every 12 hours.  His kidney function has been declining over time, with a GFR decreasing from 50 to 29. He is  scheduled to see his kidney doctor on October 27th.  Denies any shortness of breath or cough no bone pain.  No nausea no vomiting. No worsening shortness of breath or chest pain.  Patient states has been experiencing sweating and pain in the groin area, which he attributes to an infection. The area is described as 'itching like crazy', and he has been using Gold Bond powder to alleviate symptoms.  Review of Systems  Constitutional:  Negative for chills, diaphoresis, fever and weight loss.  HENT:  Negative for nosebleeds and sore throat.   Eyes:  Negative for double vision.  Cardiovascular:  Negative for chest pain, palpitations, orthopnea and leg swelling.  Gastrointestinal:  Negative for abdominal pain, blood in stool, constipation, diarrhea, heartburn, melena, nausea and vomiting.  Genitourinary:  Negative for dysuria, frequency and urgency.  Musculoskeletal:  Positive for back pain and joint pain.  Neurological:  Negative for dizziness, tingling, focal weakness, weakness and headaches.  Endo/Heme/Allergies:  Does not bruise/bleed easily.  Psychiatric/Behavioral:  Negative for depression. The patient is not nervous/anxious and does not have insomnia.      MEDICAL HISTORY:  Past Medical History:  Diagnosis Date   Acid reflux    Allergy    Arthritis    both hands   Cancer of bladder neck (HCC) 02/2018   Chemo tx's and surgical resection.   Chronic kidney disease    Complication of anesthesia    behaviors combative   COPD (chronic obstructive pulmonary disease) (HCC)    High cholesterol    History of kidney stones    Hyperlipidemia 04/03/2018   Hypertension  Pneumonia    Pre-diabetes     SURGICAL HISTORY: Past Surgical History:  Procedure Laterality Date   bladder removed  2020   CYSTOSCOPY W/ URETERAL STENT REMOVAL Right 08/10/2018   Procedure: CYSTOSCOPY WITH STENT REMOVAL;  Surgeon: Alvaro Hummer, Steve;  Location: [facility];  Service: Urology;  Laterality: Right;    CYSTOSCOPY WITH BIOPSY N/A 04/04/2018   Procedure: CYSTOSCOPY WITH TURBT;  Surgeon: Penne Knee, Steve;  Location: [facility];  Service: Urology;  Laterality: N/A;   CYSTOSCOPY WITH URETEROSCOPY AND STENT PLACEMENT Right 04/04/2018   Procedure: CYSTOSCOPY WITH URETEROSCOPY AND STENT PLACEMENT;  Surgeon: Penne Knee, Steve;  Location: [facility];  Service: Urology;  Laterality: Right;   HERNIA REPAIR     LAPAROTOMY N/A 04/24/2021   Procedure: EXPLORATORY LAPAROTOMY; PARASTOMAL HERNIA REPAIR Sugerbaker technique  incisional hernia repair, right sided TAR;  Surgeon: Stechschulte, Deward PARAS, Steve;  Location: [facility];  Service: General;  Laterality: N/A;   NASAL SINUS SURGERY     PORTA CATH INSERTION N/A 04/23/2018   Procedure: PORTA CATH INSERTION;  Surgeon: Marea Selinda RAMAN, Steve;  Location: [facility];  Service: Cardiovascular;  Laterality: N/A;   PORTA CATH REMOVAL N/A 09/04/2018   Procedure: PORTA CATH REMOVAL;  Surgeon: Jama Cordella MATSU, Steve;  Location: [facility];  Service: Cardiovascular;  Laterality: N/A;   SUBMANDIBULAR GLAND EXCISION Left 06/22/2022   Procedure: EXCISION SUBMANDIBULAR GLAND;  Surgeon: Blair Deward, Steve;  Location: [facility];  Service: ENT;  Laterality: Left;   TEE WITHOUT CARDIOVERSION N/A 09/05/2018   Procedure: TRANSESOPHAGEAL ECHOCARDIOGRAM (TEE);  Surgeon: Gollan, Timothy J, Steve;  Location: [facility];  Service: Cardiovascular;  Laterality: N/A;   TRANSURETHRAL RESECTION OF PROSTATE N/A 04/04/2018   Procedure: TRANSURETHRAL RESECTION OF THE PROSTATE (TURP);  Surgeon: Penne Knee, Steve;  Location: [facility];  Service: Urology;  Laterality: N/A;   URETERAL BIOPSY Right 04/04/2018   Procedure: URETERAL BIOPSY;  Surgeon: Penne Knee, Steve;  Location: [facility];  Service: Urology;  Laterality: Right;     SOCIAL HISTORY:  Social History   Socioeconomic History   Marital status: Widowed    Spouse name: Not on file   Number of children: Not on file   Years of  education: Not on file   Highest education level: Not on file  Occupational History   Not on file  Tobacco Use   Smoking status: Former    Current packs/day: 0.00    Average packs/day: 1.5 packs/day for 30.0 years (45.0 ttl pk-yrs)    Types: Cigarettes    Start date: 23    Quit date: 2009    Years since quitting: 16.7   Smokeless tobacco: Former  Building services engineer status: Never Used  Substance and Sexual Activity   Alcohol use: Not Currently    Alcohol/week: 7.0 standard drinks of alcohol    Types: 5 Cans of beer, 2 Standard drinks or equivalent per week    Comment: ocassional    Drug use: Not Currently   Sexual activity: Not Currently  Other Topics Concern   Not on file  Social History Narrative   currently retired.  Works part-time.  Remote history of smoking.  No alcohol.   Social Drivers of Corporate investment banker Strain: Low Risk  (09/28/2023)   Received from [facility]   Overall Financial Resource Strain (CARDIA)    Difficulty of Paying Living Expenses: Not hard at all  Food Insecurity: No Food Insecurity (09/28/2023)  Received from [facility]   Hunger Vital Sign    Within the past 12 months, you worried that your food would run out before you got the money to buy more.: Never true    Within the past 12 months, the food you bought just didn't last and you didn't have money to get more.: Never true  Transportation Needs: No Transportation Needs (09/28/2023)   Received from [facility] - Transportation    In the past 12 months, has lack of transportation kept you from medical appointments or from getting medications?: No    Lack of Transportation (Non-Medical): No  Physical Activity: Unknown (08/09/2018)   Exercise Vital Sign    Days of Exercise per Week: Patient declined    Minutes of Exercise per Session: Patient declined  Stress: No Stress Concern Present (08/09/2018)   [employer] of  Occupational Health - Occupational Stress Questionnaire    Feeling of Stress : Not at all  Social Connections: Unknown (08/09/2018)   Social Connection and Isolation Panel    Frequency of Communication with Friends and Family: Patient declined    Frequency of Social Gatherings with Friends and Family: Patient declined    Attends Religious Services: Patient declined    Database administrator or Organizations: Patient declined    Attends Banker Meetings: Patient declined    Marital Status: Patient declined  Intimate Partner Violence: Unknown (08/09/2018)   Humiliation, Afraid, Rape, and Kick questionnaire    Fear of Current or Ex-Partner: Patient declined    Emotionally Abused: Patient declined    Physically Abused: Patient declined    Sexually Abused: Patient declined    FAMILY HISTORY: Family History  Problem Relation Age of Onset   Diabetes Mother    Heart disease Father    Diabetes Brother    Heart attack Brother    Heart disease Brother     ALLERGIES:  is allergic to iodinated contrast media.  MEDICATIONS:  Current Outpatient Medications  Medication Sig Dispense Refill   albuterol  (VENTOLIN  HFA) 108 (90 Base) MCG/ACT inhaler Inhale into the lungs.     amLODipine  (NORVASC ) 10 MG tablet TAKE 1 TABLET(10 MG) BY MOUTH EVERY DAY     aspirin  EC 81 MG tablet Take 1 tablet by mouth daily.     atorvastatin  (LIPITOR) 20 MG tablet Take 20 mg by mouth daily.     azelastine  (ASTELIN ) 0.1 % nasal spray Place 1 spray into both nostrils 2 (two) times daily.     fluticasone-salmeterol (ADVAIR) 250-50 MCG/ACT AEPB Inhale 1 puff into the lungs 2 (two) times daily.     metoprolol  tartrate (LOPRESSOR ) 25 MG tablet TAKE 1 TABLET BY MOUTH TWICE DAILY 60 tablet 3   Multiple Vitamins-Minerals (MULTIVITAMIN WITH MINERALS) tablet Take 1 tablet by mouth daily.     pantoprazole  (PROTONIX ) 40 MG tablet Take 40 mg by mouth every evening.      predniSONE  (DELTASONE ) 20 MG tablet Take 40 mg  by mouth daily.     No current facility-administered medications for this visit.      SABRA  PHYSICAL EXAMINATION: ECOG PERFORMANCE STATUS: 1 - Symptomatic but completely ambulatory  Vitals:   04/25/24 1003  BP: 130/68  Pulse: 68  Resp: 16  Temp: (!) 95.8 F (35.4 C)  SpO2: 99%    Filed Weights   04/25/24 1003  Weight: 215 lb 9.6 oz (97.8 kg)     Physical Exam Constitutional:  Comments: He is alone.  Walking himself.  HENT:     Head: Normocephalic and atraumatic.     Mouth/Throat:     Pharynx: No oropharyngeal exudate.  Eyes:     Pupils: Pupils are equal, round, and reactive to light.  Cardiovascular:     Rate and Rhythm: Normal rate and regular rhythm.  Pulmonary:     Effort: No respiratory distress.     Breath sounds: Normal breath sounds.  Abdominal:     General: Bowel sounds are normal. There is no distension.     Palpations: Abdomen is soft. There is no mass.     Tenderness: There is no abdominal tenderness. There is no guarding or rebound.     Comments: Urostomy.   Musculoskeletal:        General: No tenderness. Normal range of motion.     Cervical back: Normal range of motion and neck supple.  Skin:    General: Skin is warm.  Neurological:     Mental Status: He is alert and oriented to person, place, and time.  Psychiatric:        Mood and Affect: Affect normal.      LABORATORY DATA:  I have reviewed the data as listed Lab Results  Component Value Date   WBC 6.3 04/25/2024   HGB 13.3 04/25/2024   HCT 40.5 04/25/2024   MCV 90.6 04/25/2024   PLT 293 04/25/2024   Recent Labs    04/27/23 1021 10/26/23 0954 04/25/24 1005  NA 138 134* 137  K 4.0 3.7 4.2  CL 103 105 107  CO2 25 22 23   GLUCOSE 111* 213* 62*  BUN 28* 36* 30*  CREATININE 1.75* 2.09* 2.32*  CALCIUM  9.4 8.8* 9.0  GFRNONAA 41* 33* 29*  PROT 7.3 7.1 7.2  ALBUMIN  4.2 3.8 3.6  AST 28 20 17   ALT 27 17 20   ALKPHOS 71 66 78  BILITOT 0.9 0.7 0.7    RADIOGRAPHIC STUDIES: I  have personally reviewed the radiological images as listed and agreed with the findings in the report. CT ABDOMEN PELVIS WO CONTRAST Result Date: 04/24/2024 CLINICAL DATA:  Bladder cancer.  * Tracking Code: BO * EXAM: CT ABDOMEN AND PELVIS WITHOUT CONTRAST TECHNIQUE: Multidetector CT imaging of the abdomen and pelvis was performed following the standard protocol without IV contrast. RADIATION DOSE REDUCTION: This exam was performed according to the departmental dose-optimization program which includes automated exposure control, adjustment of the mA and/or kV according to patient size and/or use of iterative reconstruction technique. COMPARISON:  06/13/2023. FINDINGS: Lower chest: Lung bases are clear. Heart size normal. No pericardial effusion. No pleural effusion. Distal esophagus is grossly unremarkable. Hepatobiliary: Probable small hepatic cysts. Liver and gallbladder are otherwise unremarkable. No biliary ductal dilatation. Pancreas: Negative. Spleen: Negative. Adrenals/Urinary Tract: Adrenal glands are unremarkable. Scarring in the kidneys bilaterally. Small low-attenuation lesion in the right kidney, unchanged. No specific follow-up necessary. Kidneys are otherwise grossly unremarkable. Chronic mild right ureteral prominence. Left ureter is decompressed. Cysto prostatectomy with a right lower quadrant ileal conduit. Parastomal hernia contains unobstructed small bowel. Stomach/Bowel: Stomach is unremarkable. Ileal conduit creation in the right lower quadrant with a parastomal hernia containing unobstructed small bowel. Small bowel, appendix and colon are otherwise unremarkable. Vascular/Lymphatic: Atherosclerotic calcification of the aorta. No pathologically enlarged lymph nodes. Reproductive: Prostatectomy. Other: No free fluid.  Mesenteries and peritoneum are unremarkable. Musculoskeletal: Degenerative changes in the spine. IMPRESSION: 1. No evidence of metastatic disease. 2. Cysto prostatectomy with a  right lower  quadrant ileal conduit and parastomal hernia containing unobstructed small bowel. 3.  Aortic atherosclerosis (ICD10-I70.0). Electronically Signed   By: Newell Eke M.D.   On: 04/24/2024 15:39    ASSESSMENT & PLAN:   Cancer of bladder neck (HCC) # SEP [id number]- Transitional cell bladder cancer neo-adjuvant chemo-s/p 4 cycles of dose dense MVAC; status post cystectomy [JAN 2021]. OCT 2025-  No evidence of metastatic disease.  Cysto prostatectomy with a right lower quadrant ileal conduit and parastomal hernia containing unobstructed small bowel.   # I had a long discussion with patient the fact that his cancer has been more than 5 years-and surveillance imaging October 2025 negative-I think this is reasonable for him to follow-up with his PCP going forward.  He does not need any follow-up imaging from cancer perspective.  Most likely he is cured of his malignancy.  # DEC 2023- Left submandibular nodule-PET avid-s/p ultrasound-guided biopsy-benign adenoid tumor-  re: Excision s/p referral- s/p resection- Left; Resection:  Pleomorphic Adenoma. Negative for Malignancy. Reviewed with patient- stable   # Chronic kidney disease-stage III- IV- GFR- 29   continue follow up with [facility]- stable  # HNT-on metoprolol / diuretic- continue to follow up PCP/nephrology-. Stable.   # Seasonal allergies: recommend compliance with anti-histamines; stable.   #Since patient is clinically stable I think is reasonable for the patient to follow-up with PCP/can follow-up with us  as needed.  Patient comfortable with the plan; to call us  if any questions or concerns in the interim.  Dye- allegry- contrast-allegry # DISPOSITION: # follow up as needed- Dr.B  C; Dr.linthavong.    All questions were answered. The patient knows to call the clinic with any problems, questions or concerns.     Cindy JONELLE Joe, Steve 04/25/2024 1:12 PM

## 2024-05-09 DIAGNOSIS — I129 Hypertensive chronic kidney disease with stage 1 through stage 4 chronic kidney disease, or unspecified chronic kidney disease: Secondary | ICD-10-CM | POA: Diagnosis not present

## 2024-05-09 DIAGNOSIS — R7303 Prediabetes: Secondary | ICD-10-CM | POA: Diagnosis not present

## 2024-05-09 DIAGNOSIS — I10 Essential (primary) hypertension: Secondary | ICD-10-CM | POA: Diagnosis not present

## 2024-05-09 DIAGNOSIS — N1832 Chronic kidney disease, stage 3b: Secondary | ICD-10-CM | POA: Diagnosis not present

## 2024-05-09 DIAGNOSIS — Z9889 Other specified postprocedural states: Secondary | ICD-10-CM | POA: Diagnosis not present

## 2024-05-09 DIAGNOSIS — J984 Other disorders of lung: Secondary | ICD-10-CM | POA: Diagnosis not present

## 2024-05-09 DIAGNOSIS — N184 Chronic kidney disease, stage 4 (severe): Secondary | ICD-10-CM | POA: Diagnosis not present

## 2024-05-09 DIAGNOSIS — Z8551 Personal history of malignant neoplasm of bladder: Secondary | ICD-10-CM | POA: Diagnosis not present

## 2024-05-09 DIAGNOSIS — E7800 Pure hypercholesterolemia, unspecified: Secondary | ICD-10-CM | POA: Diagnosis not present

## 2024-05-13 DIAGNOSIS — N1832 Chronic kidney disease, stage 3b: Secondary | ICD-10-CM | POA: Diagnosis not present

## 2024-05-13 DIAGNOSIS — R7303 Prediabetes: Secondary | ICD-10-CM | POA: Diagnosis not present

## 2024-05-13 DIAGNOSIS — I10 Essential (primary) hypertension: Secondary | ICD-10-CM | POA: Diagnosis not present

## 2024-05-13 DIAGNOSIS — C679 Malignant neoplasm of bladder, unspecified: Secondary | ICD-10-CM | POA: Diagnosis not present

## 2024-05-30 DIAGNOSIS — C678 Malignant neoplasm of overlapping sites of bladder: Secondary | ICD-10-CM | POA: Diagnosis not present

## 2024-05-30 DIAGNOSIS — Z936 Other artificial openings of urinary tract status: Secondary | ICD-10-CM | POA: Diagnosis not present

## 2024-05-30 DIAGNOSIS — N130 Hydronephrosis with ureteropelvic junction obstruction: Secondary | ICD-10-CM | POA: Diagnosis not present

## 2024-06-11 DIAGNOSIS — J449 Chronic obstructive pulmonary disease, unspecified: Secondary | ICD-10-CM | POA: Diagnosis not present

## 2024-06-11 DIAGNOSIS — Z87891 Personal history of nicotine dependence: Secondary | ICD-10-CM | POA: Diagnosis not present

## 2024-07-24 ENCOUNTER — Encounter: Payer: Self-pay | Admitting: Internal Medicine

## 2024-07-24 ENCOUNTER — Ambulatory Visit: Admitting: Urology

## 2024-07-24 VITALS — BP 110/67 | HR 87 | Ht 65.0 in | Wt 218.0 lb

## 2024-07-24 DIAGNOSIS — C679 Malignant neoplasm of bladder, unspecified: Secondary | ICD-10-CM

## 2024-07-24 DIAGNOSIS — N432 Other hydrocele: Secondary | ICD-10-CM

## 2024-07-24 NOTE — Progress Notes (Signed)
" ° °  07/24/2024 12:51 PM   Steve Gilbert 1950/08/04 [id number]  Reason for visit: Follow up right hydrocele, history of muscle-invasive bladder cancer  History: Muscle-invasive bladder cancer treated with neoadjuvant chemotherapy and radical cystoprostatectomy and ileal conduit diversion with Dr. Alvaro January 2020, no evidence of recurrence since that time, follows with oncology for surveillance imaging Established care with me June 2023 for right hydrocele and chronic scrotal itching  Physical Exam: BP 110/67   Pulse 87   Ht 5' 5 (1.651 m)   Wt 218 lb (98.9 kg)   SpO2 96%   BMI 36.28 kg/m  GU: Moderate right hydrocele, nontender, no testicular lesions  Imaging/labs: I personally viewed and interpreted the CT abdomen and pelvis from October 2025 with no evidence of recurrence, no hydronephrosis, partially imaged right hydrocele  Today: Overall doing well, hydrocele minimally bothersome was noncutting underwear, denies any scrotal pain Scrotal itching overall has improved from prior, really only bothersome when he is in the shower every other day  Plan:   Scrotal itching: Improved, only bothersome in the shower, no other urologic options, discussed trying other soaps or consider dermatology evaluation Right hydrocele: Stable, discussed options for intervention and he defers and prefers observation alone Muscle-invasive bladder cancer: Treated with neoadjuvant chemotherapy and radical cystoprostatectomy and ileal conduit, no evidence of recurrence.  Follows with oncology for surveillance RTC 1 year per patient preference   Steve JAYSON Burnet, MD  [facility] [address] [place], [place] [address] [phone]  "

## 2024-07-25 ENCOUNTER — Ambulatory Visit: Payer: Self-pay | Admitting: Urology

## 2025-07-24 ENCOUNTER — Ambulatory Visit: Admitting: Urology
